# Patient Record
Sex: Female | Born: 1935 | Race: White | Hispanic: No | Smoking: Former smoker
Health system: Southern US, Community
[De-identification: ages and names within clinical notes are randomized; demographics above are authoritative.]

## PROBLEM LIST (undated history)

## (undated) DIAGNOSIS — I509 Heart failure, unspecified: Secondary | ICD-10-CM

## (undated) DIAGNOSIS — R42 Dizziness and giddiness: Secondary | ICD-10-CM

## (undated) DIAGNOSIS — M479 Spondylosis, unspecified: Secondary | ICD-10-CM

## (undated) DIAGNOSIS — E785 Hyperlipidemia, unspecified: Secondary | ICD-10-CM

## (undated) DIAGNOSIS — L900 Lichen sclerosus et atrophicus: Secondary | ICD-10-CM

## (undated) DIAGNOSIS — D649 Anemia, unspecified: Secondary | ICD-10-CM

## (undated) DIAGNOSIS — I209 Angina pectoris, unspecified: Secondary | ICD-10-CM

## (undated) DIAGNOSIS — I4891 Unspecified atrial fibrillation: Secondary | ICD-10-CM

## (undated) DIAGNOSIS — E119 Type 2 diabetes mellitus without complications: Secondary | ICD-10-CM

## (undated) DIAGNOSIS — M419 Scoliosis, unspecified: Secondary | ICD-10-CM

## (undated) DIAGNOSIS — I6529 Occlusion and stenosis of unspecified carotid artery: Secondary | ICD-10-CM

## (undated) DIAGNOSIS — E669 Obesity, unspecified: Secondary | ICD-10-CM

## (undated) DIAGNOSIS — K621 Rectal polyp: Secondary | ICD-10-CM

## (undated) DIAGNOSIS — I10 Essential (primary) hypertension: Secondary | ICD-10-CM

## (undated) DIAGNOSIS — I2510 Atherosclerotic heart disease of native coronary artery without angina pectoris: Secondary | ICD-10-CM

## (undated) DIAGNOSIS — M1990 Unspecified osteoarthritis, unspecified site: Secondary | ICD-10-CM

## (undated) DIAGNOSIS — M4800 Spinal stenosis, site unspecified: Secondary | ICD-10-CM

## (undated) HISTORY — DX: Heart failure, unspecified: I50.9

## (undated) HISTORY — DX: Spinal stenosis, site unspecified: M48.00

## (undated) HISTORY — DX: Essential (primary) hypertension: I10

## (undated) HISTORY — DX: Unspecified atrial fibrillation: I48.91

## (undated) HISTORY — DX: Atherosclerotic heart disease of native coronary artery without angina pectoris: I25.10

## (undated) HISTORY — DX: Rectal polyp: K62.1

## (undated) HISTORY — DX: Lichen sclerosus et atrophicus: L90.0

## (undated) HISTORY — PX: CATARACT EXTRACTION W/ INTRAOCULAR LENS  IMPLANT, BILATERAL: SHX1307

## (undated) HISTORY — PX: WRIST FRACTURE SURGERY: SHX121

## (undated) HISTORY — DX: Hyperlipidemia, unspecified: E78.5

## (undated) HISTORY — DX: Occlusion and stenosis of unspecified carotid artery: I65.29

## (undated) HISTORY — DX: Spondylosis, unspecified: M47.9

## (undated) HISTORY — PX: ANKLE FRACTURE SURGERY: SHX122

## (undated) HISTORY — DX: Scoliosis, unspecified: M41.9

## (undated) HISTORY — PX: FRACTURE SURGERY: SHX138

## (undated) HISTORY — DX: Obesity, unspecified: E66.9

---

## 2000-10-29 ENCOUNTER — Other Ambulatory Visit: Admission: RE | Admit: 2000-10-29 | Discharge: 2000-10-29 | Payer: Self-pay | Admitting: Family Medicine

## 2002-04-29 ENCOUNTER — Other Ambulatory Visit: Admission: RE | Admit: 2002-04-29 | Discharge: 2002-04-29 | Payer: Self-pay | Admitting: Family Medicine

## 2003-01-19 ENCOUNTER — Encounter: Admission: RE | Admit: 2003-01-19 | Discharge: 2003-01-19 | Payer: Self-pay | Admitting: *Deleted

## 2003-01-19 ENCOUNTER — Encounter: Payer: Self-pay | Admitting: *Deleted

## 2003-03-05 ENCOUNTER — Encounter: Payer: Self-pay | Admitting: *Deleted

## 2003-03-05 ENCOUNTER — Encounter: Admission: RE | Admit: 2003-03-05 | Discharge: 2003-03-05 | Payer: Self-pay | Admitting: *Deleted

## 2004-07-04 ENCOUNTER — Encounter: Admission: RE | Admit: 2004-07-04 | Discharge: 2004-07-04 | Payer: Self-pay | Admitting: Family Medicine

## 2004-08-30 ENCOUNTER — Encounter: Admission: RE | Admit: 2004-08-30 | Discharge: 2004-08-30 | Payer: Self-pay | Admitting: Family Medicine

## 2004-09-15 ENCOUNTER — Encounter: Admission: RE | Admit: 2004-09-15 | Discharge: 2004-09-15 | Payer: Self-pay | Admitting: Family Medicine

## 2004-10-11 ENCOUNTER — Other Ambulatory Visit: Admission: RE | Admit: 2004-10-11 | Discharge: 2004-10-11 | Payer: Self-pay | Admitting: Family Medicine

## 2004-11-07 ENCOUNTER — Encounter: Admission: RE | Admit: 2004-11-07 | Discharge: 2004-11-07 | Payer: Self-pay | Admitting: Family Medicine

## 2004-12-31 HISTORY — PX: BACK SURGERY: SHX140

## 2005-03-08 ENCOUNTER — Encounter: Admission: RE | Admit: 2005-03-08 | Discharge: 2005-03-08 | Payer: Self-pay | Admitting: Family Medicine

## 2005-04-17 ENCOUNTER — Encounter: Admission: RE | Admit: 2005-04-17 | Discharge: 2005-04-17 | Payer: Self-pay | Admitting: Family Medicine

## 2005-06-22 ENCOUNTER — Encounter: Admission: RE | Admit: 2005-06-22 | Discharge: 2005-06-22 | Payer: Self-pay | Admitting: Family Medicine

## 2005-07-12 ENCOUNTER — Ambulatory Visit (HOSPITAL_COMMUNITY): Admission: RE | Admit: 2005-07-12 | Discharge: 2005-07-12 | Payer: Self-pay | Admitting: Chiropractic Medicine

## 2005-08-13 ENCOUNTER — Encounter: Admission: RE | Admit: 2005-08-13 | Discharge: 2005-08-13 | Payer: Self-pay | Admitting: Family Medicine

## 2005-08-30 ENCOUNTER — Encounter: Admission: RE | Admit: 2005-08-30 | Discharge: 2005-08-30 | Payer: Self-pay | Admitting: Neurosurgery

## 2005-09-14 ENCOUNTER — Inpatient Hospital Stay (HOSPITAL_COMMUNITY): Admission: RE | Admit: 2005-09-14 | Discharge: 2005-09-16 | Payer: Self-pay | Admitting: Neurosurgery

## 2005-10-12 ENCOUNTER — Encounter: Admission: RE | Admit: 2005-10-12 | Discharge: 2005-10-25 | Payer: Self-pay | Admitting: Neurosurgery

## 2005-12-31 HISTORY — PX: CAROTID ENDARTERECTOMY: SUR193

## 2005-12-31 HISTORY — PX: CORONARY ARTERY BYPASS GRAFT: SHX141

## 2006-01-01 ENCOUNTER — Encounter (INDEPENDENT_AMBULATORY_CARE_PROVIDER_SITE_OTHER): Payer: Self-pay | Admitting: *Deleted

## 2006-01-01 ENCOUNTER — Inpatient Hospital Stay (HOSPITAL_COMMUNITY): Admission: AD | Admit: 2006-01-01 | Discharge: 2006-01-06 | Payer: Self-pay | Admitting: *Deleted

## 2006-01-02 ENCOUNTER — Encounter (INDEPENDENT_AMBULATORY_CARE_PROVIDER_SITE_OTHER): Payer: Self-pay | Admitting: *Deleted

## 2006-02-07 ENCOUNTER — Encounter (HOSPITAL_COMMUNITY): Admission: RE | Admit: 2006-02-07 | Discharge: 2006-05-08 | Payer: Self-pay | Admitting: *Deleted

## 2006-05-09 ENCOUNTER — Encounter (HOSPITAL_COMMUNITY): Admission: RE | Admit: 2006-05-09 | Discharge: 2006-08-07 | Payer: Self-pay | Admitting: *Deleted

## 2007-03-11 ENCOUNTER — Ambulatory Visit: Payer: Self-pay | Admitting: Vascular Surgery

## 2007-10-08 ENCOUNTER — Other Ambulatory Visit: Admission: RE | Admit: 2007-10-08 | Discharge: 2007-10-08 | Payer: Self-pay | Admitting: Gynecology

## 2008-03-23 ENCOUNTER — Ambulatory Visit: Payer: Self-pay | Admitting: Vascular Surgery

## 2009-03-22 ENCOUNTER — Ambulatory Visit: Payer: Self-pay | Admitting: Vascular Surgery

## 2009-06-08 ENCOUNTER — Encounter: Payer: Self-pay | Admitting: Gynecology

## 2009-06-08 ENCOUNTER — Other Ambulatory Visit: Admission: RE | Admit: 2009-06-08 | Discharge: 2009-06-08 | Payer: Self-pay | Admitting: Gynecology

## 2009-06-08 ENCOUNTER — Ambulatory Visit: Payer: Self-pay | Admitting: Gynecology

## 2009-06-13 ENCOUNTER — Ambulatory Visit: Payer: Self-pay | Admitting: Gynecology

## 2010-03-14 ENCOUNTER — Ambulatory Visit: Payer: Self-pay | Admitting: Vascular Surgery

## 2010-03-27 ENCOUNTER — Encounter: Admission: RE | Admit: 2010-03-27 | Discharge: 2010-03-27 | Payer: Self-pay | Admitting: Orthopedic Surgery

## 2010-04-17 ENCOUNTER — Encounter: Admission: RE | Admit: 2010-04-17 | Discharge: 2010-04-17 | Payer: Self-pay | Admitting: Orthopedic Surgery

## 2010-08-16 ENCOUNTER — Ambulatory Visit: Payer: Self-pay | Admitting: Cardiology

## 2010-08-21 ENCOUNTER — Telehealth (INDEPENDENT_AMBULATORY_CARE_PROVIDER_SITE_OTHER): Payer: Self-pay | Admitting: *Deleted

## 2010-08-22 ENCOUNTER — Ambulatory Visit: Payer: Self-pay

## 2010-08-22 ENCOUNTER — Encounter: Payer: Self-pay | Admitting: Cardiovascular Disease

## 2010-08-22 ENCOUNTER — Ambulatory Visit: Payer: Self-pay | Admitting: Cardiovascular Disease

## 2010-08-22 ENCOUNTER — Encounter (HOSPITAL_COMMUNITY): Admission: RE | Admit: 2010-08-22 | Discharge: 2010-09-26 | Payer: Self-pay | Admitting: Cardiology

## 2010-10-17 ENCOUNTER — Ambulatory Visit: Payer: Self-pay | Admitting: Cardiology

## 2010-11-29 ENCOUNTER — Encounter: Admission: RE | Admit: 2010-11-29 | Discharge: 2010-11-29 | Payer: Self-pay | Admitting: Orthopedic Surgery

## 2011-01-21 ENCOUNTER — Encounter: Payer: Self-pay | Admitting: Internal Medicine

## 2011-01-21 ENCOUNTER — Encounter: Payer: Self-pay | Admitting: Neurosurgery

## 2011-01-30 NOTE — Assessment & Plan Note (Signed)
Summary: Cardiology Nuclear Testing  Nuclear Med Background Indications for Stress Test: Evaluation for Ischemia, Graft Patency   History: CABG, Echo, Heart Catheterization, Myocardial Perfusion Study  History Comments: '07 CABG X 1; '09 [id number]; '10 Echo:normal  Symptoms: Chest Pain, Palpitations    Nuclear Pre-Procedure Cardiac Risk Factors: Carotid Disease, Hypertension, Lipids, PVD Caffeine/Decaff Intake: none NPO After: 7:00 AM Lungs: clear IV 0.9% NS with Angio Cath: 22g     IV Site: R Hand IV Started by: Cathlyn Parsons, RN Cup Size 40D     Height (in): 70 Weight (lb): 228 BMI: 32.83  Nuclear Med Study 1 or 2 day study:  1 day     Stress Test Type:  Stress Reading MD:  Charlton Haws, MD     Referring MD:  P.Jordan Resting Radionuclide:  Technetium 16m Tetrofosmin     Resting Radionuclide Dose:  11 mCi  Stress Radionuclide:  Technetium 51m Tetrofosmin     Stress Radionuclide Dose:  33 mCi   Stress Protocol Exercise Time (min):  3:30 min     Max HR:  126 bpm     Predicted Max HR:  145 bpm       Percent Max HR:  86.90 %     METS: 5.2    Stress Test Technologist:  Milana Na, EMT-P     Nuclear Technologist:  Domenic Polite, CNMT  Rest Procedure  Myocardial perfusion imaging was performed at rest 45 minutes following the intravenous administration of Technetium 59m Tetrofosmin.  Stress Procedure  The patient exercised for 3:30. The patient stopped due to fatigue, sob, and denied any chest pain.  There were no significant ST-T wave changes and occ pvcs/VBigemeny.  Technetium 10m Tetrofosmin was injected at peak exercise and myocardial perfusion imaging was performed after a brief delay.  QPS Raw Data Images:  Normal; no motion artifact; normal heart/lung ratio. Stress Images:  Normal homogeneous uptake in all areas of the myocardium. Rest Images:  Normal homogeneous uptake in all areas of the myocardium. Subtraction (SDS):  Normal Transient Ischemic  Dilatation:  0.95  (Normal <1.22)  Lung/Heart Ratio:  0.31  (Normal <0.45)  Quantitative Gated Spect Images QGS EDV:  69 ml QGS ESV:  16 ml QGS EF:  76 % QGS cine images:  normal  Findings Normal nuclear study      Overall Impression  Exercise Capacity: Poor exercise capacity. BP Response: Normal blood pressure response. Clinical Symptoms: No chest pain ECG Impression: No significant ST segment change suggestive of ischemia. Overall Impression: Normal stress nuclear study. Overall Impression Comments: Very poor exercise capacity.  Consider pharmacologic stress in future

## 2011-01-30 NOTE — Progress Notes (Signed)
Summary: Nuclear pre procedure  Phone Note Outgoing Call Call back at [facility] Phone [phone]   Call placed by: Rea College, CMA,  August 21, 2010 4:31 PM Call placed to: Patient Summary of Call: Left message with information on [employer] Information Sheet (see scanned document for details).      Nuclear Med Background Indications for Stress Test: Evaluation for Ischemia, Graft Patency   History: CABG, Echo, Heart Catheterization, Myocardial Perfusion Study  History Comments: '07 CABG X 1; '09 [id number]; '10 Echo:normal  Symptoms: Chest Pain    Nuclear Pre-Procedure Cardiac Risk Factors: Carotid Disease, Hypertension, Lipids, PVD

## 2011-03-22 ENCOUNTER — Other Ambulatory Visit (INDEPENDENT_AMBULATORY_CARE_PROVIDER_SITE_OTHER): Payer: Medicare Other

## 2011-03-22 DIAGNOSIS — Z48812 Encounter for surgical aftercare following surgery on the circulatory system: Secondary | ICD-10-CM

## 2011-03-22 DIAGNOSIS — I6529 Occlusion and stenosis of unspecified carotid artery: Secondary | ICD-10-CM

## 2011-03-31 NOTE — Procedures (Unsigned)
CAROTID DUPLEX EXAM  INDICATION:  Follow up right CEA, left carotid disease.  HISTORY: Diabetes:  No. Cardiac:  Yes. Hypertension:  Yes. Smoking:  Previous. Previous Surgery:  Right CEA in 2007. CV History: Amaurosis Fugax No, Paresthesias No, Hemiparesis No.                                      RIGHT             LEFT Brachial systolic pressure:         152               148 Brachial Doppler waveforms:         WNL               WNL Vertebral direction of flow:        Antegrade         Abnormal antegrade DUPLEX VELOCITIES (cm/sec) CCA peak systolic                   102               79 ECA peak systolic                   122               380 ICA peak systolic                   157               192 ICA end diastolic                   38                50 PLAQUE MORPHOLOGY:                  Heterogenous      Heterogenous PLAQUE AMOUNT:                      Mild              Moderate PLAQUE LOCATION:                    CCA/CEA/ECA       CCA/ECA/ICA  IMPRESSION: 1. 1% to 39% plaquing of right carotid endarterectomy. 2. 40% to 59% left internal carotid artery stenosis. 3. Bilateral external carotid arteries are abnormal. 4. Antegrade bilateral vertebral arteries; however, the left is     abnormal.  ___________________________________________ Di Kindle. Edilia Bo, M.D.  LT/MEDQ  D:  03/22/2011  T:  03/22/2011  Job:  [id number]

## 2011-04-10 ENCOUNTER — Encounter (INDEPENDENT_AMBULATORY_CARE_PROVIDER_SITE_OTHER): Payer: Medicare Other | Admitting: Gynecology

## 2011-04-10 DIAGNOSIS — B373 Candidiasis of vulva and vagina: Secondary | ICD-10-CM

## 2011-04-10 DIAGNOSIS — N898 Other specified noninflammatory disorders of vagina: Secondary | ICD-10-CM

## 2011-04-10 DIAGNOSIS — N390 Urinary tract infection, site not specified: Secondary | ICD-10-CM

## 2011-04-10 DIAGNOSIS — N952 Postmenopausal atrophic vaginitis: Secondary | ICD-10-CM

## 2011-04-10 DIAGNOSIS — R82998 Other abnormal findings in urine: Secondary | ICD-10-CM

## 2011-04-10 DIAGNOSIS — L940 Localized scleroderma [morphea]: Secondary | ICD-10-CM

## 2011-04-30 ENCOUNTER — Other Ambulatory Visit: Payer: Self-pay | Admitting: *Deleted

## 2011-04-30 MED ORDER — NITROGLYCERIN 0.4 MG SL SUBL: 0.4000 mg | SUBLINGUAL_TABLET | SUBLINGUAL | Status: DC | PRN

## 2011-04-30 NOTE — Telephone Encounter (Signed)
escribe medication per fax request  

## 2011-05-02 ENCOUNTER — Emergency Department (HOSPITAL_COMMUNITY): Payer: No Typology Code available for payment source

## 2011-05-02 ENCOUNTER — Inpatient Hospital Stay (HOSPITAL_COMMUNITY)
Admission: EM | Admit: 2011-05-02 | Discharge: 2011-05-05 | DRG: 493 | Disposition: A | Discharge status: Skilled Nursing Facility | Payer: No Typology Code available for payment source | Attending: Orthopedic Surgery | Admitting: Orthopedic Surgery

## 2011-05-02 ENCOUNTER — Other Ambulatory Visit: Payer: Self-pay | Admitting: *Deleted

## 2011-05-02 DIAGNOSIS — S52599A Other fractures of lower end of unspecified radius, initial encounter for closed fracture: Secondary | ICD-10-CM | POA: Diagnosis present

## 2011-05-02 DIAGNOSIS — Z951 Presence of aortocoronary bypass graft: Secondary | ICD-10-CM

## 2011-05-02 DIAGNOSIS — M4120 Other idiopathic scoliosis, site unspecified: Secondary | ICD-10-CM | POA: Diagnosis present

## 2011-05-02 DIAGNOSIS — E669 Obesity, unspecified: Secondary | ICD-10-CM | POA: Diagnosis present

## 2011-05-02 DIAGNOSIS — I2510 Atherosclerotic heart disease of native coronary artery without angina pectoris: Secondary | ICD-10-CM | POA: Diagnosis present

## 2011-05-02 DIAGNOSIS — I050 Rheumatic mitral stenosis: Secondary | ICD-10-CM | POA: Diagnosis present

## 2011-05-02 DIAGNOSIS — R5381 Other malaise: Secondary | ICD-10-CM | POA: Diagnosis present

## 2011-05-02 DIAGNOSIS — S8253XA Displaced fracture of medial malleolus of unspecified tibia, initial encounter for closed fracture: Principal | ICD-10-CM | POA: Diagnosis present

## 2011-05-02 DIAGNOSIS — Z01810 Encounter for preprocedural cardiovascular examination: Secondary | ICD-10-CM

## 2011-05-02 DIAGNOSIS — E785 Hyperlipidemia, unspecified: Secondary | ICD-10-CM | POA: Diagnosis present

## 2011-05-02 DIAGNOSIS — Y998 Other external cause status: Secondary | ICD-10-CM

## 2011-05-02 LAB — COMPREHENSIVE METABOLIC PANEL
ALT: 27 U/L (ref 0–35)
AST: 34 U/L (ref 0–37)
CO2: 23 mEq/L (ref 19–32)
Calcium: 9.1 mg/dL (ref 8.4–10.5)
Chloride: 101 mEq/L (ref 96–112)
Creatinine, Ser: 0.91 mg/dL (ref 0.4–1.2)
GFR calc Af Amer: >60 mL/min (ref >60)
GFR calc non Af Amer: >60 mL/min (ref >60)
Glucose, Bld: 179 mg/dL — ABNORMAL HIGH (ref 70–99)
Total Bilirubin: 0.2 mg/dL — ABNORMAL LOW (ref 0.3–1.2)

## 2011-05-02 LAB — TYPE AND SCREEN
ABO/RH(D): A POS
Antibody Screen: NEGATIVE

## 2011-05-02 LAB — CBC
MCV: 84.2 fL (ref 78.0–100.0)
Platelets: 305 10*3/uL (ref 150–400)
RBC: 4.31 MIL/uL (ref 3.87–5.11)
RDW: 13.8 % (ref 11.5–15.5)
WBC: 12.2 10*3/uL — ABNORMAL HIGH (ref 4.0–10.5)

## 2011-05-02 LAB — PROTIME-INR: Prothrombin Time: 12.9 seconds (ref 11.6–15.2)

## 2011-05-02 MED ORDER — IOHEXOL 300 MG/ML  SOLN
100.0000 mL | Freq: Once | INTRAMUSCULAR | Status: AC | PRN
  Administered 2011-05-02: 100 mL via INTRAVENOUS

## 2011-05-02 MED ORDER — NIACIN ER (ANTIHYPERLIPIDEMIC) 500 MG PO TBCR: 500.0000 mg | EXTENDED_RELEASE_TABLET | Freq: Every day | ORAL | Status: DC

## 2011-05-02 NOTE — Telephone Encounter (Signed)
Fax received from pharmacy. Refill completed. Jodette Zhyon Antenucci RN  

## 2011-05-03 LAB — CBC
Hemoglobin: 10.5 g/dL — ABNORMAL LOW (ref 12.0–15.0)
MCHC: 31.8 g/dL (ref 30.0–36.0)
RDW: 14.1 % (ref 11.5–15.5)
WBC: 11.9 10*3/uL — ABNORMAL HIGH (ref 4.0–10.5)

## 2011-05-03 LAB — BASIC METABOLIC PANEL
Calcium: 8.7 mg/dL (ref 8.4–10.5)
GFR calc Af Amer: >60 mL/min (ref >60)
GFR calc non Af Amer: >60 mL/min (ref >60)
Potassium: 4.8 mEq/L (ref 3.5–5.1)
Sodium: 135 mEq/L (ref 135–145)

## 2011-05-04 LAB — POCT I-STAT, CHEM 8
BUN: 31 mg/dL — ABNORMAL HIGH (ref 6–23)
Chloride: 106 mEq/L (ref 96–112)
HCT: 37 % (ref 36.0–46.0)
Potassium: 4.6 mEq/L (ref 3.5–5.1)
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
## 2011-05-15 NOTE — Procedures (Signed)
CAROTID DUPLEX EXAM   INDICATION:  Followup evaluation of known carotid artery disease.   HISTORY:  Diabetes:  No.  Cardiac:  Coronary artery bypass graft in 2007.  Hypertension:  No.  Smoking:  Quit 3 years ago.  Previous Surgery:  Right carotid endarterectomy in 2007 by Dr. Hart Rochester.  CV History:  Previous duplex on March 11, 2007, revealed no right ICA  stenosis, status post endarterectomy and 20-39% left ICA stenosis.  Amaurosis Fugax No, Paresthesias No, Hemiparesis No                                       RIGHT             LEFT  Brachial systolic pressure:         140               136  Brachial Doppler waveforms:         Triphasic         Triphasic  Vertebral direction of flow:        Antegrade         Antegrade  DUPLEX VELOCITIES (cm/sec)  CCA peak systolic                   87                100  ECA peak systolic                   98                227  ICA peak systolic                   82                148  ICA end diastolic                   20                32  PLAQUE MORPHOLOGY:                  None              Calcified,  irregular  PLAQUE AMOUNT:                      None              Mild to moderate  PLAQUE LOCATION:                    None              Proximal ICA/ECA   IMPRESSION:  1. No right ICA stenosis status post endarterectomy.  2. Left ICA stenosis 40-59%.  3. Left ECA stenosis.  4. No significant change from previous study performed 03/11/2007.   ___________________________________________  [name]. Hart Rochester, M.D.   MC/MEDQ  D:  03/23/2008  T:  03/23/2008  Job:  [id number]

## 2011-05-15 NOTE — Procedures (Signed)
CAROTID DUPLEX EXAM   INDICATION:  Follow up known carotid artery disease.   HISTORY:  Diabetes:  No.  Cardiac:  CABG in 2007.  Hypertension:  No.  Smoking:  Quit about 4 years ago.  Previous Surgery:  Right carotid endarterectomy.  CV History:  Amaurosis Fugax No, Paresthesias No, Hemiparesis No.                                       RIGHT             LEFT  Brachial systolic pressure:         150               150  Brachial Doppler waveforms:         Biphasic          Biphasic  Vertebral direction of flow:        Antegrade         Antegrade  DUPLEX VELOCITIES (cm/sec)  CCA peak systolic                   55                90  ECA peak systolic                   78                343  ICA peak systolic                   92                132  ICA end diastolic                   25                31  PLAQUE MORPHOLOGY:                  None              Heterogenous  PLAQUE AMOUNT:                      None              Moderate  PLAQUE LOCATION:                    None              ICA, ECA   IMPRESSION:  1. 40-59% stenosis noted in the left internal carotid artery.  2. Normal carotid duplex noted in the right internal carotid artery,      status post right carotid endarterectomy.  3. Antegrade bilateral vertebral arteries.   ___________________________________________  Quita Skye Hart Rochester, M.D.   MG/MEDQ  D:  03/22/2009  T:  03/22/2009  Job:  [id number]

## 2011-05-15 NOTE — Procedures (Signed)
CAROTID DUPLEX EXAM   INDICATION:  Followup of carotid disease.   HISTORY:  Diabetes:  No.  Cardiac:  CABG in 2007.  Hypertension:  No.  Smoking:  Previous.  Previous Surgery:  Right carotid endarterectomy.  CV History:  Asymptomatic.  Amaurosis Fugax , Paresthesias , Hemiparesis                                       RIGHT             LEFT  Brachial systolic pressure:         160               146  Brachial Doppler waveforms:         Triphasic         Triphasic  Vertebral direction of flow:        Antegrade         Atypical  DUPLEX VELOCITIES (cm/sec)  CCA peak systolic                   76                72  ECA peak systolic                   101               392  ICA peak systolic                   99                131  ICA end diastolic                   31                43  PLAQUE MORPHOLOGY:                  None              Mixed  PLAQUE AMOUNT:                      None              Moderate  PLAQUE LOCATION:                                      ICA, ECA   IMPRESSION:  1. Patent right carotid with no evidence of restenosis.  2. 40-59% stenosis noted in the left internal carotid artery.  3. Left external carotid artery stenosis.   ___________________________________________  Quita Skye Hart [place], M.D.   CJ/MEDQ  D:  03/14/2010  T:  03/14/2010  Job:  [id number]
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
## 2011-05-18 NOTE — Consult Note (Signed)
[name], [name]               ACCOUNT NO.:  [id number]   MEDICAL RECORD NO.:  [id number]          PATIENT TYPE:  OIB   LOCATION:  2914                         FACILITY:  [facility]   PHYSICIAN:  Di Kindle. Edilia Bo, M.D.DATE OF BIRTH:  1933/06/25   DATE OF CONSULTATION:  01/01/2006  DATE OF DISCHARGE:                                   CONSULTATION   REASON FOR CONSULTATION:  Greater than 80% right carotid stenosis.   HISTORY:  This is a 77 year old woman who had presented with asymptomatic  coronary artery disease. She had a positive stress test and underwent  cardiac catheterization. She had exertional chest pain on and off for the  last three months. She was found to have significant coronary disease and is  scheduled for coronary revascularization by Dr. Tyrone Sage tomorrow as part of  preoperative workup. She underwent a carotid duplex scan which showed a  greater than 80% right carotid stenosis with a 40-60% left carotid stenosis.  Vascular surgery was consulted for further recommendations. Consult was from  Dr. Tyrone Sage.   This patient is right-handed. She denies any previous history of stroke,  TIAs, expressive or receptive aphasia, or amaurosis fugax.   PAST MEDICAL HISTORY:  1.  Significant for coronary artery disease.  2.  Hypercholesterolemia.  3.  Questionable history of hypertension.  4.  She denies any history of diabetes, previous history of myocardial      infarction, history of congestive heart failure, or history of      emphysema.   SOCIAL HISTORY:  She is married. She has three children. She had smoked off  and on for 20 years but quit recently. She does not use alcohol on a regular  basis.   FAMILY HISTORY:  Her father died from a MI age 14. Her mother died from MI  at age 27. She is unaware of any history of premature cardiovascular  disease.   REVIEW OF SYSTEMS:  GENERAL:  She had no weight loss, weight gain or  problems or appetite. CARDIAC:  She  had no recent palpitations or  arrhythmias. She has had exertional chest pain on off last few months.  PULMONARY:  She had no bronchitis, asthma or wheezing. She had no  significant dyspnea on exertion. GI:  She had no recent change of bowel  habits and has no history of peptic ulcer disease. GU:  She had no dysuria  or frequency. NEURO:  She had no dizziness, blackouts, headaches or  seizures. VASCULAR:  She has had no claudication, rest pain or nonhealing  ulcers. She had no previous history of DVT or phlebitis. Review of systems  is otherwise unremarkable.   PHYSICAL EXAMINATION:  VITAL SIGNS:  Blood pressure is 140/58, heart rate is  82, temperature 98.9.  HEENT:  She has bilateral carotid bruits. No cervical lymphadenopathy.  LUNGS:  Clear bilaterally to auscultation.  CARDIAC:  She has a regular rate and rhythm.  ABDOMEN:  Soft and nontender. I cannot palpate an aneurysm. She has palpable  femoral pulses, popliteal and pedal pulses bilaterally.  NEUROLOGIC:  Exam is nonfocal. She has  no significant lower extremity  swelling.   Carotid duplex scan shows a greater than 80% the right carotid stenosis in  the mid right ICA with a good looking vessel distal to this with no  significant stenosis in the distal ICA. End-diastolic velocity in the right  is 129 cm/sec. This is clearly greater than an 80% stenosis. On the left  side, she has 40-60% stenosis. Both vertebral arteries are patent with  normally directed flow.   Given the severity of the carotid stenosis on the right, I have recommended  right carotid endarterectomy combined with her CABG in order to lower her  risk of perioperative and future stroke. We have discussed the indications  for the procedure and potential complications including but not limited to  bleeding, stroke (perioperative risk 1-2%), MI, nerve injury, or other  unpredictable medical problems. All of her questions were answered. She is  agreeable to proceed.  This will be scheduled for either Dr. Madilyn Fireman, Dr.  Hart Rochester or myself. She is on aspirin.      Di Kindle. Edilia Bo, M.D.  Electronically Signed     CSD/MEDQ  D:  01/01/2006  T:  01/01/2006  Job:  [id number]

## 2011-05-18 NOTE — H&P (Signed)
[name], [name]               ACCOUNT NO.:  [id number]   MEDICAL RECORD NO.:  [id number]           PATIENT TYPE:   LOCATION:                                 FACILITY:   PHYSICIAN:  Elmore Guise., M.D.DATE OF BIRTH:  15-Apr-1933   DATE OF ADMISSION:  01/01/2006  DATE OF DISCHARGE:                                HISTORY & PHYSICAL   INDICATION FOR ADMISSION:  Elective cardiac catheterization after abnormal  stress test.   HISTORY OF PRESENT ILLNESS:  The patient has a 77 year old, white female  with past medical history of hypertension, dyslipidemia and strong family  history of coronary disease who presents with increasing exertional chest  pain off and on for the last 3 months.  She had back surgery in September  and following her back surgery, as she started to get around with her normal  activities, she started having an aching in her left chest area.  She  reports that her symptoms are worse when she is up and active such as  walking up a hill or steps.  She does okay when she just does light to  moderate activity.  She had an adenosine Cardiolite with limited exercise on  November14,2006, which showed a reversible inferior wall defect and normal  LV systolic function with an EF of 83%.  She initially wanted to try  medications to see if this would control her symptoms.  She was started on  low dose Toprol.  She continues to be active in her travels.  She continues  to have occasional chest tightness and aching.  She now would like to  discuss possible cardiac catheterization.  Review of systems are otherwise  negative.   CURRENT MEDICATIONS:  1.  Tricor 145 mg daily.  2.  Zetia 10 mg daily.  3.  Prozac 10 mg daily.  4.  Advil p.r.n.  5.  Multivitamin once daily.  6.  Aspirin 325 mg daily.  7.  Toprol XL 25 mg daily.   ALLERGIES:  SULFA causing itching.   FAMILY HISTORY:  Family history is positive for heart disease in her mother  and father.  Both died from  heart attacks.   SOCIAL HISTORY:  She is married.  She does water aerobics approximately 4-5  times per week.  She smoked off and on for 20 pack years, quit approximately  3 months ago.  Does not drink any alcohol.   PHYSICAL EXAMINATION:  VITAL SIGNS:  Her weight is 198 pounds.  Her blood  pressure is 110/80, heart rate 80 and regular.  GENERAL:  She is a very pleasant, elderly white female, alert and oriented  x4 in no acute distress.  NECK:  She has no JVD and no bruits.  LUNGS:  Lungs were clear.  HEART:  Heart regular with a normal S1 and S2.  ABDOMEN:  Soft, nontender, nondistended.  EXTREMITIES:  Extremities are warm with 2+ pulses and no significant edema.   LABORATORY DATA AND X-RAY FINDINGS:  Her EKG shows normal sinus rhythm,  normal axis with poor R wave progression across her precordial  leads.  No  significant ST and T wave changes.  The patient had lab work today to  include CBC, BMP, coagulations.   IMPRESSION:  1.  Continued chest pain with abnormal stress test.  2.  History of hypertension and dyslipidemia.   PLAN:  The patient will be scheduled for cardiac catheterization with  possible intervention if needed.  She will continue her medications as  listed.      Elmore Guise., M.D.  Electronically Signed     TWK/MEDQ  D:  12/25/2005  T:  12/25/2005  Job:  [id number]

## 2011-05-18 NOTE — Discharge Summary (Signed)
[name], HEARLD               ACCOUNT NO.:  [id number]   MEDICAL RECORD NO.:  [id number]          PATIENT TYPE:  INP   LOCATION:  2016                         FACILITY:  [facility]   PHYSICIAN:  Pecola Leisure, PA   DATE OF BIRTH:  13-Oct-1933   DATE OF ADMISSION:  01/01/2006  DATE OF DISCHARGE:  01/06/2006                                 DISCHARGE SUMMARY   ADMISSION DIAGNOSIS:  Chest pain.   PAST MEDICAL HISTORY/DISCHARGE DIAGNOSES:  1.  Hypertension.  2.  Dyslipidemia.  3.  Depression.  4.  Coronary artery disease, status post coronary artery bypass grafting x1.  5.  Right internal carotid artery stenosis, status post right carotid      endarterectomy.  6.  Elevated blood sugars postoperatively.   ALLERGIES:  SULFA.   BRIEF HISTORY:  The patient is a 77 year old Caucasian female, who in  September of 2006 underwent a lumbar laminectomy secondary to chronic back  pain.  As she was recovering and participating in rehab, she noticed an  increase in exertional substernal chest pain with mild dyspnea.  It was  relieved with rest.  She noted that for the past month the amount of pain  decreased; however, she had markedly limited her activities.  The patient  was evaluated by Dr. Reyes Ivan, and she presented to [facility] on January 01, 2006, for a cardiac stress test.  That was performed by Dr. Reyes Ivan and  revealed normal LV function with a reversible inferior perfusion defect that  was small.  It was deemed an abnormal study, and therefore the patient was  taken for cardiac cath the same day.  The patient was noted to have a normal  left-appearing system and a greater than 90% ostial right coronary lesion,  and then a mid right lesion on a moderate-sized right vessel.  Angioplasty  was attempted of the right system; however, the ostial lesion did not open  much with angioplasty.  The mid lesion slightly improved, however, while  carrying out this procedure, a dissection occurred  distal to the mid lesion.  The patient remained hemodynamically stable, however, and there were no EKG  changes.  Dr. Sheliah Plane of the CVTS Service was consulted regarding  potential surgical revascularization.  Dr. Tyrone Sage evaluated the patient on  January 01, 2006, and it was his opinion that the patient should proceed with  coronary artery bypass graft surgery.  In preparation for the patient's  surgery, preoperative carotid Dopplers were performed, and this revealed a  greater than 80% right carotid stenosis and a 40 to 60% left carotid  stenosis.  Secondary to these findings, the Vascular Surgery Service was  consulted for further recommendations.  Dr. Edilia Bo performed the evaluation  on January 01, 2006, and it was his opinion that the patient should proceed  with a combined right carotid endarterectomy and CABG procedure.   HOSPITAL COURSE:  The patient was admitted and underwent all the studies as  previously stated.  She was found to have bilateral carotid artery stenoses  and coronary artery disease.  The patient was then taken to  the OR on  January 02, 2006, for a combined coronary artery bypass grafting and a right  carotid endarterectomy.  The coronary artery bypass grafting x1 was a  saphenous vein graft to the right coronary artery.  The right carotid  endarterectomy with Dacron patch angioplasty was performed by Dr. Hart Rochester  simultaneously.  The endoscopic vessel harvesting was performed on the left  thigh.  The patient tolerated the procedure well and was hemodynamically  stable immediately postoperatively.  The patient was transferred from the OR  to the SICU in stable condition.  The patient was extubated without  complication and woke up from anesthesia neurologically intact.   The patient's postoperative course has progressed as expected.  The only  exception was a noted elevation in her CBGs postoperatively.  These have  been monitored, and she has not been  treated medically.  On postoperative  day one, the patient was neurologically intact, and the right neck wound was  healing well.  She was afebrile with stable vital signs.  All chest tubes  and invasive lines were discontinued in a routine manner.  All drips were  weaned accordingly.  The patient has been volume overloaded postoperatively  and has been diuresed accordingly.   The patient's postoperative course has continued to progress as expected.  She began cardiac rehab on postoperative day one and has continued to  increase her tolerance to a satisfactory level at this time.  On  postoperative day three, she is without complaint, and her bowel function  has returned.  She is afebrile with stable vital signs and maintaining a  normal sinus rhythm.  Her chest x-ray is stable.  On physical examination,  cardiac is regular rate and rhythm, lungs are clear to auscultation, abdomen  is benign.  The incisions are clean, dry, and intact.  There is no edema  present in the extremities, and she is neurologically intact.  Of note on  postoperative day three, she is noted to have an acute blood loss anemia  with a hemoglobin of 8.3 and an hematocrit of 24.1.  She is being started on  iron and folic acid, and this will be monitored with a followup CBC prior to  discharge.  As long as the patient continues to progress in the current  manner, she will be ready for discharge in the next one to two days pending  morning round reevaluation.   LABORATORY DATA:  CBC and BMP on January 05, 2006:  White count 9, hemoglobin  8.3, hematocrit 24.1, platelets 200, sodium 133, potassium 3.9, BUN 6,  creatinine 0.6, glucose 115.   CONDITION ON DISCHARGE:  Stable.   INSTRUCTIONS:   MEDICATIONS:  1.  Aspirin 325 mg daily.  2.  Toprol-XL 25 mg daily.  3.  Zetia 10 mg daily.  4.  Tricor 145 mg daily.  5.  Niferex 150 mg daily.  6.  Folic acid 1 mg daily.  7.  Lasix 40 mg daily x5 days.  8.  K-Dur 20 mEq  daily x5 days. 9.  Prozac 10 mg Monday, Wednesday, Friday.  10. Pravachol 40 mg daily.  11. Tylox 1-2 q.4-6h p.r.n. pain.   ACTIVITY:  No driving, no lifting of more than 10 pounds, and the patient  should continue daily breathing and walking exercises.   DIET:  Low salt, low fat.   WOUND CARE:  The patient may shower daily and clean the incisions with soap  and water.  Of wound problems arise, she  is to contact the CVTS office.   FOLLOWUP APPOINTMENTS:  1.  A followup appointment with Dr. Reyes Ivan.  She will be instructed to      contact his office for an appointment two weeks after discharge at which      time a chest x-ray will be taken.  2.  With Dr. Tyrone Sage three weeks after discharge.  The CVTS office will      contact the patient with a date and time      of this appointment.  3.  With Dr. Hart Rochester six months after discharge with followup carotid duplex.      The CVTS office will also contact the patient with a date and time of      this appointment.      Pecola Leisure, PA     AY/MEDQ  D:  01/05/2006  T:  01/06/2006  Job:  [id number]   cc:   Quita Skye. Hart Rochester, M.D.  [address]  [place]  [place] [address]

## 2011-05-18 NOTE — Op Note (Signed)
[name], [name]               ACCOUNT NO.:  [id number]   MEDICAL RECORD NO.:  [id number]          PATIENT TYPE:  INP   LOCATION:  2399                         FACILITY:  [facility]   PHYSICIAN:  Quita Skye. Hart Rochester, M.D.  DATE OF BIRTH:  05-07-1933   DATE OF PROCEDURE:  01/02/2006  DATE OF DISCHARGE:                                 OPERATIVE REPORT   PREOPERATIVE DIAGNOSIS:  Severe right carotid stenosis, asymptomatic, with  dissection of right coronary artery following percutaneous transluminal  coronary angioplasty with need for coronary artery bypass grafting.   POSTOPERATIVE DIAGNOSIS:  Severe right carotid stenosis, asymptomatic, with  dissection of right coronary artery following percutaneous transluminal  coronary angioplasty with need for coronary artery bypass grafting.   OPERATION:  Dr. Candie Chroman portion is right carotid endarterectomy with Dacron  patch angioplasty.   SURGEON:  Dr. Hart Rochester.   FIRST ASSISTANT:  Dr. Ofilia Neas.   SECOND ASSISTANT:  Zadie Rhine, PA   ANESTHESIA:  General endotracheal.   BRIEF HISTORY:  This patient was scheduled for coronary artery bypass  grafting by Dr. Tyrone Sage following an attempt at PTCA and stenting of the  right coronary artery which resulted in a dissection of the right coronary  artery.  In her preoperative duplex scan of her carotid arteries, she was  noted to have an 85-90% right internal carotid stenosis which was  asymptomatic and a mild left internal carotid stenosis.  She was scheduled  for right carotid endarterectomy in conjunction with coronary artery bypass  grafting.   PROCEDURE:  The patient was taken to the operating room and placed in supine  position at which time satisfactory general endotracheal anesthesia was  administered.  Appropriate monitoring lines were inserted by anesthesia, and  the routine prepping and draping for coronary artery bypass grafting was  performed as well as the right neck.  Incision  was made along the anterior  border of the sternocleidomastoid muscle and carried down through  subcutaneous tissue and platysma using the Bovie.  The common facial vein  and external jugular vein was ligated with 3-0 silk ties and divided,  exposing the common, internal and external carotid arteries.  Care was taken  not to injure the vagus or hypoglossal nerves, both of which were exposed.  There was a calcified atherosclerotic plaque at the carotid bifurcation  extending up the internal carotid about 3 cm.  Distal vessel appeared  normal.  A #10 shunt was prepared and the patient was heparinized.  The  carotid vessels were occluded with vascular clamps, a longitudinal opening  made in the common carotid with a 15 blade, extended up the internal carotid  with a Potts scissors to a point distal to the disease.  The plaque was  approximately 80% stenotic in severity, but the distal vessel appeared  normal.  The #10 shunt was prepared, and the patient was heparinized.  Carotid vessels were occluded with vascular clamps, a longitudinal opening  made in the common carotid with a 15 blade and extended with Potts scissors.  The #10 shunt was inserted without difficulty, reestablishing flow in about  two minutes.  A standard endarterectomy was then performed using the  elevator and the Potts scissors with an eversion endarterectomy of the  external carotid.  The plaque feathered off the distal internal carotid  artery nicely, not requiring any tacking sutures.  Lumen was thoroughly  irrigated heparin saline.  All loose debris carefully removed.  Arteriotomy  was closed with a patch using continuous 6-0 Prolene.  Prior to completion  of the closure the shunt was removed, and following antegrade and retrograde  flushing the closure was completed with reestablishment of flow initially up  the external and up the internal branch.  Carotid was occluded for less than  two minutes for removal of the  shunt.   There was excellent Doppler flow in both vessels at the conclusion of this  procedure.  Dr. Tyrone Sage then proceeded with coronary artery bypass  grafting, and following this the right neck was closed in the routine  manner.           ______________________________  Quita Skye. Hart Rochester, M.D.     JDL/MEDQ  D:  01/02/2006  T:  01/02/2006  Job:  [id number]

## 2011-05-18 NOTE — Cardiovascular Report (Signed)
[name], [name]               ACCOUNT NO.:  [id number]   MEDICAL RECORD NO.:  [id number]          PATIENT TYPE:  INP   LOCATION:  2399                         FACILITY:  [facility]   PHYSICIAN:  [name]. Swaziland, M.D.  DATE OF BIRTH:  May 04, 1933   DATE OF PROCEDURE:  01/01/2006  DATE OF DISCHARGE:                              CARDIAC CATHETERIZATION   PROCEDURE:  Coronary intervention procedure   ATTENDING:  [name]. [place]az[place]and, M.D.   INDICATIONS FOR PROCEDURE:  [name] who presented with  typical anginal symptoms and had an abnormal stress test.  Diagnostic  cardiac catheterization performed by Dr. Reyes Ivan demonstrated single-vessel  obstructive coronary disease.  She had a critical ostial right coronary  stenosis at 95%; this was followed by a 90% stenosis in the mid right  coronary in an acutely angulated bend.  The right coronary was a dominant  vessel, supplying the PDA and posterolateral branch.   ACCESS:  Via right to right femoral artery using a 7-French arterial sheath.   CONTRAST:  A total of 450 mL of contrast were used for both the diagnostic  and interventional procedure.   MEDICATIONS FOR THE INTERVENTION:  Included heparin 4500 units IV,  Integrilin double bolus 180 mcg/kg followed by continuous infusion of 2  mcg/kg per minute and additional Versed 1 mg IV, nitroglycerin 200 mcg  intracoronary x2 and an additional 2000 units of heparin were given later in  the procedure.   INTERVENTIONAL PROCEDURE:  We initially used a 7-French AR-1 guide and a  0.014 high-torque floppy extra-support wire for access.  Despite having  reasonable support, the guide support was suboptimal, given the severe  ostial stenosis and difficulty seating the guide catheter.  Sideholes were  used in the catheter.  We were initially able to cross the right coronary  lesions with moderate difficulty using the extra-support wire.  We initially  dilated both lesions using a 2.0  x 15-mm Maverick balloon.  The ostial  lesion was dilated at 10 and then 12 atmospheres.  The mid-vessel lesion was  dilated to 8 atmospheres x2.  We then upgraded to a 2.5 x 15-mm Maverick  balloon, dilating the mid-vessel lesion up to 8 atmospheres and the ostium  at 10 and then 12 atmospheres.  Despite full balloon expansion, there was  still severe waisting of the balloon at the ostial site, showing an adequate  expansion.  At this point we attempted to cross the lesion with a cutting  balloon.  We initially used a 3.0 x 10-mm cutter, but we were unable to pass  the ostium.  We then downgraded to the 2.5-mm x 10 cutter and again were  able to pass the cutting balloon past the ostium.  Again, guide support here  was very difficult, although AR-1 appear to be coaxial and to offer good  transaxial support.  In the process of attempting to cross the ostium with  the cutting balloon, we did have difficulty with the guide backing out and  pulling the wire back as well across the lesion.  We had a great deal  of  difficulty at this point of re-engaging the ostium of the right coronary  artery.  The AR-1 was unable to re-engage.  We switched to left Amplatz 1  and with difficulty, we did get catheter engagement, but at an acute  angulation that made [place] unable to cross the ostium with a wire.  We then  switched to a 6-French 3-D RC guide,  which was able to seat in the ostium,  but still gave marginal support.  We were able to pass a wire down across  the ostium, but when we tried to cross the mid-vessel lesion, the wire would  not pass.  It appeared to go subintimal and at this point, there was  evidence of subintimal dissection.  We attempted to cross also with a Choyce  PT wire, again without success.  The spiral dissection did propagate down  distally, resulting in compromise in flow of both the PDA and posterolateral  branches.  The patient remained completely pain-free.  Her ECG showed no   acute ST changes and she remained hemodynamically stable.  This was felt to  be due to the fact that she did have reasonably good collateral blood flow  prior to the procedure from the left coronary artery.  At this point, it was  felt that further attempts at crossing the mid-vessel lesion were futile.  The patient was stable and the procedure was stopped at this point and she  was transferred to the coronary intensive care unit for aggressive medical  therapy.  It was felt that she should have any recurrent chest pain, then  she would require coronary bypass surgery.   FINAL INTERPRETATION:  Successful catheter-based intervention of the mid and  ostial right coronary artery due to development of acute spiral dissection  in the mid-vessel propagating distally.  Procedure was complicated by  difficulties with adequate guide support due to the ostial location of the  lesion and the fact that we were unable to expand this lesion adequately  with balloon.           ______________________________  [name]. Swaziland, M.D.     PMJ/MEDQ  D:  01/02/2006  T:  01/02/2006  Job:  [id number]   cc:   Elmore Guise., M.D.  Fax: [phone]   Sheliah Plane, MD  [address]  [place]  [place] [address]

## 2011-06-26 ENCOUNTER — Inpatient Hospital Stay (HOSPITAL_COMMUNITY)
Admission: EM | Admit: 2011-06-26 | Discharge: 2011-06-27 | DRG: 247 | Disposition: A | Discharge status: Home / Self Care | Payer: Medicare Other | Attending: Cardiology | Admitting: Cardiology

## 2011-06-26 ENCOUNTER — Emergency Department (HOSPITAL_COMMUNITY): Payer: Medicare Other

## 2011-06-26 DIAGNOSIS — Z7982 Long term (current) use of aspirin: Secondary | ICD-10-CM

## 2011-06-26 DIAGNOSIS — R079 Chest pain, unspecified: Secondary | ICD-10-CM

## 2011-06-26 DIAGNOSIS — R072 Precordial pain: Secondary | ICD-10-CM

## 2011-06-26 DIAGNOSIS — E669 Obesity, unspecified: Secondary | ICD-10-CM | POA: Diagnosis present

## 2011-06-26 DIAGNOSIS — Z951 Presence of aortocoronary bypass graft: Secondary | ICD-10-CM

## 2011-06-26 DIAGNOSIS — I10 Essential (primary) hypertension: Secondary | ICD-10-CM | POA: Diagnosis present

## 2011-06-26 DIAGNOSIS — I200 Unstable angina: Secondary | ICD-10-CM | POA: Diagnosis present

## 2011-06-26 DIAGNOSIS — I2510 Atherosclerotic heart disease of native coronary artery without angina pectoris: Secondary | ICD-10-CM

## 2011-06-26 DIAGNOSIS — F329 Major depressive disorder, single episode, unspecified: Secondary | ICD-10-CM | POA: Diagnosis present

## 2011-06-26 DIAGNOSIS — Z87891 Personal history of nicotine dependence: Secondary | ICD-10-CM

## 2011-06-26 DIAGNOSIS — E785 Hyperlipidemia, unspecified: Secondary | ICD-10-CM | POA: Diagnosis present

## 2011-06-26 DIAGNOSIS — M4120 Other idiopathic scoliosis, site unspecified: Secondary | ICD-10-CM | POA: Diagnosis present

## 2011-06-26 DIAGNOSIS — F3289 Other specified depressive episodes: Secondary | ICD-10-CM | POA: Diagnosis present

## 2011-06-26 DIAGNOSIS — I509 Heart failure, unspecified: Secondary | ICD-10-CM | POA: Diagnosis present

## 2011-06-26 LAB — APTT: aPTT: 35 seconds (ref 24–37)

## 2011-06-26 LAB — DIFFERENTIAL
Lymphocytes Relative: 18 % (ref 12–46)
Lymphs Abs: 1.3 10*3/uL (ref 0.7–4.0)
Neutro Abs: 5 10*3/uL (ref 1.7–7.7)
Neutrophils Relative %: 69 % (ref 43–77)

## 2011-06-26 LAB — BASIC METABOLIC PANEL
CO2: 21 mEq/L (ref 19–32)
Calcium: 8.5 mg/dL (ref 8.4–10.5)
Creatinine, Ser: 0.71 mg/dL (ref 0.50–1.10)

## 2011-06-26 LAB — CK TOTAL AND CKMB (NOT AT ARMC)
CK, MB: 3.4 ng/mL (ref 0.3–4.0)
CK, MB: 3.5 ng/mL (ref 0.3–4.0)
Relative Index: INVALID (ref 0.0–2.5)
Relative Index: INVALID (ref 0.0–2.5)
Total CK: 78 U/L (ref 7–177)
Total CK: 84 U/L (ref 7–177)

## 2011-06-26 LAB — CBC
HCT: 32.4 % — ABNORMAL LOW (ref 36.0–46.0)
Hemoglobin: 10.6 g/dL — ABNORMAL LOW (ref 12.0–15.0)
MCV: 84.2 fL (ref 78.0–100.0)
Platelets: 235 10*3/uL (ref 150–400)
RBC: 3.85 MIL/uL — ABNORMAL LOW (ref 3.87–5.11)
WBC: 7.3 10*3/uL (ref 4.0–10.5)

## 2011-06-26 LAB — CARDIAC PANEL(CRET KIN+CKTOT+MB+TROPI): Relative Index: INVALID (ref 0.0–2.5)

## 2011-06-26 LAB — TSH: TSH: 2.113 u[IU]/mL (ref 0.350–4.500)

## 2011-06-26 LAB — PRO B NATRIURETIC PEPTIDE: Pro B Natriuretic peptide (BNP): 911.3 pg/mL — ABNORMAL HIGH (ref 0–450)

## 2011-06-26 LAB — TROPONIN I: Troponin I: <0.3 ng/mL (ref <0.30)

## 2011-06-26 MED ORDER — TECHNETIUM TO 99M ALBUMIN AGGREGATED
6.0000 | Freq: Once | INTRAVENOUS | Status: AC | PRN
  Administered 2011-06-26: 6 via INTRAVENOUS

## 2011-06-26 MED ORDER — XENON XE 133 GAS
10.0000 | GAS_FOR_INHALATION | Freq: Once | RESPIRATORY_TRACT | Status: AC | PRN
  Administered 2011-06-26: 10 via RESPIRATORY_TRACT

## 2011-06-27 DIAGNOSIS — I200 Unstable angina: Secondary | ICD-10-CM

## 2011-06-27 HISTORY — PX: CORONARY ANGIOPLASTY WITH STENT PLACEMENT: SHX49

## 2011-06-27 LAB — CBC
MCV: 83.8 fL (ref 78.0–100.0)
Platelets: 205 10*3/uL (ref 150–400)
RBC: 3.7 MIL/uL — ABNORMAL LOW (ref 3.87–5.11)
WBC: 6.3 10*3/uL (ref 4.0–10.5)

## 2011-06-27 LAB — BASIC METABOLIC PANEL
CO2: 24 mEq/L (ref 19–32)
Chloride: 107 mEq/L (ref 96–112)
Sodium: 140 mEq/L (ref 135–145)

## 2011-06-27 LAB — LIPID PANEL
Cholesterol: 182 mg/dL (ref 0–200)
HDL: 35 mg/dL — ABNORMAL LOW (ref >39)
Total CHOL/HDL Ratio: 5.2 RATIO
Triglycerides: 149 mg/dL (ref <150)
VLDL: 30 mg/dL (ref 0–40)

## 2011-07-02 ENCOUNTER — Encounter: Payer: Self-pay | Admitting: Nurse Practitioner

## 2011-07-02 NOTE — Cardiovascular Report (Signed)
[name], [name]               ACCOUNT NO.:  [id number]  MEDICAL RECORD NO.:  [id number]  LOCATION:  6526                         FACILITY:  [facility]  PHYSICIAN:  [name]. Swaziland, M.D.  DATE OF BIRTH:  1933/03/20  DATE OF PROCEDURE:  06/26/2011 DATE OF DISCHARGE:                           CARDIAC CATHETERIZATION   INDICATIONS FOR PROCEDURE:  The patient is a 77 year old white female with known history of coronary artery disease.  She is status post failed angioplasty of the right coronary artery in 2007 and had emergent single-vessel bypass of the right coronary artery.  She now presents with typical unstable angina.  PROCEDURE:  Left heart catheterization, coronary and left ventricular angiography.  ACCESSES:  Via the right femoral artery using standard Seldinger technique.  EQUIPMENT:  5-French 4-cm right and left Judkins catheter, 5-French pigtail catheter, 5-French arterial sheath, 6-French arterial sheath, 6- French left 4 guide, a Prowater wire, a BMW wire, a 2.25 x 12-mm Emerge balloon, a 2.5 x 12-mm Promus stent, a 2.5 x 8-mm [place] Quantum apex balloon.  MEDICATIONS:  Local anesthesia 1% Xylocaine, Versed total of 3 mg IV, fentanyl 25 mcg IV, Plavix 600 mg p.o., labetalol 20 mg IV, Apresoline 10 mg IV, nitroglycerin 200 mcg intracoronary x1, Angiomax 0.75 mg/kg bolus followed by continuous infusion at 1.75 mg/kg per hour. Subsequent ACT was 402.  CONTRAST:  180 mL of Omnipaque.  HEMODYNAMIC DATA:  The aortic pressure was 210/92 with a mean of 144 mmHg.  Left ventricular pressure was 209 with an EDP of 29 mmHg.  ANGIOGRAPHIC DATA:  The left coronary artery arises and distributes normally.  The left main coronary artery is without significant disease.  The left anterior descending artery has mild wall irregularities.  The left circumflex coronary artery had a focal 90% stenosis in the midvessel.  The right coronary artery arises and distributes normally.  It has  80- 90% ostial stenosis.  This is followed by long 90% stenosis from the proximal to mid vessel.  The saphenous vein graft to the PDA is widely patent with excellent runoff.  Left ventricular angiography performed in the RAO view demonstrates normal left ventricular size and contractility with normal systolic function.  Ejection fraction is estimated at 55-60%.  We proceeded at this point with percutaneous intervention of the mid circumflex.  We were able to cross this easily with the Prowater wire. We predilated the lesion with a 2.25-mm balloon to 6 atmospheres.  At this point, we attempted to cross with our stent, but we were unable to negotiate the proximal circumflex because of the angulation and calcification.  We then placed a BMW wire across the lesion and used this as our primary wire using a Prowater wire as a buddy wire.  At this point, we were able to cross the lesion with a stent.  It was deployed at 12 atmospheres.  We postdilated the stent with a 2.5 x 8-mm [place] Quantum balloon to 16 atmospheres x2.  This yielded an excellent angiographic result with 0% residual stenosis and TIMI grade 3 flow.  At this point, we closed the right femoral access using an Angio-Seal device with excellent hemostasis.  FINAL INTERPRETATION: 1. Two-vessel  obstructive atherosclerotic coronary artery disease. 2. Patent saphenous vein graft to PDA. 3. Normal left ventricular function. 4. Successful stenting of the mid left circumflex coronary artery.  PLAN:  We will continue aspirin and Plavix.          ______________________________ [name]. Swaziland, M.D.     PMJ/MEDQ  D:  06/26/2011  T:  06/27/2011  Job:  [id number]  cc:   Tammy R. Collins Scotland, M.D.  Electronically Signed by PETER Swaziland M.D. on 07/02/2011 09:37:31 AM
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
## 2011-07-11 ENCOUNTER — Encounter: Payer: No Typology Code available for payment source | Admitting: Nurse Practitioner

## 2011-07-18 ENCOUNTER — Other Ambulatory Visit: Payer: Self-pay | Admitting: *Deleted

## 2011-07-18 ENCOUNTER — Ambulatory Visit (INDEPENDENT_AMBULATORY_CARE_PROVIDER_SITE_OTHER): Payer: Medicare Other | Admitting: Nurse Practitioner

## 2011-07-18 ENCOUNTER — Encounter: Payer: Self-pay | Admitting: Nurse Practitioner

## 2011-07-18 VITALS — BP 124/80 | HR 60 | Ht 68.5 in | Wt 218.0 lb

## 2011-07-18 DIAGNOSIS — E785 Hyperlipidemia, unspecified: Secondary | ICD-10-CM | POA: Insufficient documentation

## 2011-07-18 DIAGNOSIS — I2510 Atherosclerotic heart disease of native coronary artery without angina pectoris: Secondary | ICD-10-CM

## 2011-07-18 MED ORDER — LOSARTAN POTASSIUM 50 MG PO TABS: 50.0000 mg | ORAL_TABLET | Freq: Every day | ORAL | Status: DC

## 2011-07-18 NOTE — Patient Instructions (Signed)
Stay on your current medicines. Regular exercise and diet is recommended I will see you back in 6 weeks

## 2011-07-18 NOTE — Assessment & Plan Note (Signed)
Weight loss is recommended 

## 2011-07-18 NOTE — Telephone Encounter (Signed)
escribe medication per fax request  
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
## 2011-07-18 NOTE — Assessment & Plan Note (Signed)
She has a history of failed angioplasty of the RCA with subsequent single vessel CABG in [place] of 2007. She is now s/p DES to the LCX. Graft is patent. She is now asymptomatic and doing well. No change in her medicines. She is committed to Plavix for the next one year. We will see her back in 6 weeks. Patient is agreeable to this plan and will call if any problems develop in the interim.

## 2011-07-18 NOTE — Assessment & Plan Note (Signed)
She is allergic to statin, tricor and zetia. I stressed the importance of weight loss and dietary measures. She is on Niaspan and able to tolerate.
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
## 2011-07-31 ENCOUNTER — Telehealth: Payer: Self-pay | Admitting: Cardiology

## 2011-07-31 NOTE — Telephone Encounter (Signed)
PT SAID HAS A COUPLE OF QUESTIONS FOR ANITA OR LORI. WOULD NOT ELABORATE.

## 2011-08-02 ENCOUNTER — Telehealth: Payer: Self-pay | Admitting: Cardiology

## 2011-08-02 NOTE — Telephone Encounter (Signed)
Called to ask a question about stopping her plavix prior to getting her epidural shot. Decided not to have the epidural in her back done and feels like she doesn't need it to stop her plavix. Said there is no need to call back.

## 2011-08-24 ENCOUNTER — Other Ambulatory Visit: Payer: Self-pay | Admitting: *Deleted

## 2011-08-24 MED ORDER — METOPROLOL SUCCINATE ER 25 MG PO TB24: 25.0000 mg | ORAL_TABLET | Freq: Every day | ORAL | Status: DC

## 2011-08-24 NOTE — Telephone Encounter (Signed)
escribe medication per fax request  

## 2011-08-29 ENCOUNTER — Ambulatory Visit (INDEPENDENT_AMBULATORY_CARE_PROVIDER_SITE_OTHER): Payer: Medicare Other | Admitting: Nurse Practitioner

## 2011-08-29 ENCOUNTER — Telehealth: Payer: Self-pay | Admitting: Cardiology

## 2011-08-29 ENCOUNTER — Encounter: Payer: Self-pay | Admitting: Nurse Practitioner

## 2011-08-29 DIAGNOSIS — I130 Hypertensive heart and chronic kidney disease with heart failure and stage 1 through stage 4 chronic kidney disease, or unspecified chronic kidney disease: Secondary | ICD-10-CM | POA: Insufficient documentation

## 2011-08-29 DIAGNOSIS — I10 Essential (primary) hypertension: Secondary | ICD-10-CM

## 2011-08-29 DIAGNOSIS — I2510 Atherosclerotic heart disease of native coronary artery without angina pectoris: Secondary | ICD-10-CM

## 2011-08-29 MED ORDER — LOSARTAN POTASSIUM 50 MG PO TABS: 50.0000 mg | ORAL_TABLET | Freq: Two times a day (BID) | ORAL | Status: DC

## 2011-08-29 NOTE — Telephone Encounter (Signed)
Lm that Losartan is 50 mg BID # 60 w/ 3 refills per Sunday Spillers

## 2011-08-29 NOTE — Patient Instructions (Signed)
I want you to increase your Cozaar to 50 mg two times a day Try to check some blood pressures at home [facility] to use the Tylenol arthritis and try to not use Mobic I will see you in 3 weeks and check some lab work then. You do not need to fast. Call for any problems.

## 2011-08-29 NOTE — Assessment & Plan Note (Signed)
She is doing well. No chest pain reported. She is committed to her Plavix for one year.
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
## 2011-08-29 NOTE — Telephone Encounter (Signed)
Had a question about the Losartin dosage that Mason sent in today . Please call back. I have pulled the chart.

## 2011-08-29 NOTE — Assessment & Plan Note (Signed)
Blood pressure is up today. She takes her medicines at night. I have increased her Cozaar to 50 mg BID. I will see her back in 3 weeks with a BMET. Have asked her to avoid [facility] and to try Tylenol Arthritis instead. She is to try and check some blood pressures as an outpatient.

## 2011-09-19 ENCOUNTER — Encounter: Payer: Self-pay | Admitting: Nurse Practitioner

## 2011-09-19 ENCOUNTER — Ambulatory Visit (INDEPENDENT_AMBULATORY_CARE_PROVIDER_SITE_OTHER): Payer: Medicare Other | Admitting: Nurse Practitioner

## 2011-09-19 ENCOUNTER — Other Ambulatory Visit (INDEPENDENT_AMBULATORY_CARE_PROVIDER_SITE_OTHER): Payer: Medicare Other | Admitting: *Deleted

## 2011-09-19 DIAGNOSIS — I10 Essential (primary) hypertension: Secondary | ICD-10-CM

## 2011-09-19 DIAGNOSIS — R42 Dizziness and giddiness: Secondary | ICD-10-CM

## 2011-09-19 DIAGNOSIS — I2510 Atherosclerotic heart disease of native coronary artery without angina pectoris: Secondary | ICD-10-CM

## 2011-09-19 LAB — BASIC METABOLIC PANEL
BUN: 25 mg/dL — ABNORMAL HIGH (ref 6–23)
CO2: 28 mEq/L (ref 19–32)
Calcium: 9.2 mg/dL (ref 8.4–10.5)
Chloride: 103 mEq/L (ref 96–112)
Creatinine, Ser: 0.8 mg/dL (ref 0.4–1.2)
GFR: 78.61 mL/min (ref >60.00)
Glucose, Bld: 102 mg/dL — ABNORMAL HIGH (ref 70–99)
Potassium: 5.6 mEq/L — ABNORMAL HIGH (ref 3.5–5.1)
Sodium: 137 mEq/L (ref 135–145)

## 2011-09-19 MED ORDER — MECLIZINE HCL 25 MG PO TABS: 25.0000 mg | ORAL_TABLET | Freq: Three times a day (TID) | ORAL | Status: AC | PRN

## 2011-09-19 NOTE — Assessment & Plan Note (Signed)
Blood pressure remains up. It is running high at home as well. She is agreeable to now increasing the Cozaar to 50 mg BID. I will see her back in a month.
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
## 2011-09-19 NOTE — Assessment & Plan Note (Signed)
I think this is more inner ear related. She may try some meclizine.

## 2011-09-19 NOTE — Assessment & Plan Note (Addendum)
No chest pain. Exercise and weight loss are encouraged.

## 2011-09-19 NOTE — Patient Instructions (Signed)
Lets try the Cozaar twice a day Lets try some Meclizine 25 mg as needed for dizziness. You may take up to 3 times a day. I want to see you in a month.  Monitor your blood pressure at home and keep a diary  Call for any problems.

## 2011-09-26 ENCOUNTER — Telehealth: Payer: Self-pay | Admitting: *Deleted

## 2011-09-26 DIAGNOSIS — I10 Essential (primary) hypertension: Secondary | ICD-10-CM

## 2011-09-26 NOTE — Telephone Encounter (Signed)
Notified of lab results. Will repeat Bmet tomorrow 9/27

## 2011-09-26 NOTE — Telephone Encounter (Signed)
Message copied by Lorayne Bender on Wed Sep 26, 2011 10:26 AM ------      Message from: Rosalio Macadamia      Created: Thu Sep 20, 2011  7:53 AM       Need to redraw. Her Cozaar was increased at the office visit. No need to fast.

## 2011-09-27 ENCOUNTER — Other Ambulatory Visit: Payer: Medicare Other | Admitting: *Deleted

## 2011-09-28 ENCOUNTER — Other Ambulatory Visit (INDEPENDENT_AMBULATORY_CARE_PROVIDER_SITE_OTHER): Payer: Medicare Other | Admitting: *Deleted

## 2011-09-28 DIAGNOSIS — I10 Essential (primary) hypertension: Secondary | ICD-10-CM

## 2011-09-28 LAB — BASIC METABOLIC PANEL
BUN: 27 mg/dL — ABNORMAL HIGH (ref 6–23)
CO2: 25 mEq/L (ref 19–32)
Calcium: 9.2 mg/dL (ref 8.4–10.5)
Chloride: 107 mEq/L (ref 96–112)
Creatinine, Ser: 0.8 mg/dL (ref 0.4–1.2)
GFR: 70.03 mL/min (ref >60.00)
Glucose, Bld: 92 mg/dL (ref 70–99)
Potassium: 5.3 mEq/L — ABNORMAL HIGH (ref 3.5–5.1)
Sodium: 139 mEq/L (ref 135–145)

## 2011-10-03 ENCOUNTER — Telehealth: Payer: Self-pay | Admitting: *Deleted

## 2011-10-03 DIAGNOSIS — I10 Essential (primary) hypertension: Secondary | ICD-10-CM

## 2011-10-03 NOTE — Telephone Encounter (Signed)
Message copied by Lorayne Bender on Wed Oct 03, 2011 11:06 AM ------      Message from: Rosalio Macadamia      Created: Mon Oct 01, 2011  7:53 AM       Ok to report. Labs are satisfactory. Please recheck BMET on return visit.

## 2011-10-03 NOTE — Telephone Encounter (Signed)
Lm w/lab results. Will repeat Bmet on ov 10/19

## 2011-10-18 ENCOUNTER — Encounter: Payer: Self-pay | Admitting: *Deleted

## 2011-10-19 ENCOUNTER — Ambulatory Visit (INDEPENDENT_AMBULATORY_CARE_PROVIDER_SITE_OTHER): Payer: Medicare Other | Admitting: Nurse Practitioner

## 2011-10-19 ENCOUNTER — Other Ambulatory Visit: Payer: Medicare Other | Admitting: *Deleted

## 2011-10-19 ENCOUNTER — Encounter: Payer: Self-pay | Admitting: Nurse Practitioner

## 2011-10-19 VITALS — BP 136/80 | HR 72 | Ht 68.5 in | Wt 220.8 lb

## 2011-10-19 DIAGNOSIS — I10 Essential (primary) hypertension: Secondary | ICD-10-CM

## 2011-10-19 DIAGNOSIS — I2510 Atherosclerotic heart disease of native coronary artery without angina pectoris: Secondary | ICD-10-CM

## 2011-10-19 LAB — BASIC METABOLIC PANEL
BUN: 26 mg/dL — ABNORMAL HIGH (ref 6–23)
CO2: 23 mEq/L (ref 19–32)
Calcium: 9.5 mg/dL (ref 8.4–10.5)
Chloride: 103 mEq/L (ref 96–112)
Creat: 0.85 mg/dL (ref 0.50–1.10)
Glucose, Bld: 103 mg/dL — ABNORMAL HIGH (ref 70–99)
Potassium: 4.7 mEq/L (ref 3.5–5.3)
Sodium: 139 mEq/L (ref 135–145)

## 2011-10-19 NOTE — Assessment & Plan Note (Signed)
Blood pressure is coming down. Repeat by me is 140/80. She wants to try and work on her weight. We will leave her on her current regimen. We will see her back in about 3 months. She will monitor at home. Patient is agreeable to this plan and will call if any problems develop in the interim.
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
## 2011-10-19 NOTE — Assessment & Plan Note (Signed)
No chest pain reported 

## 2011-10-19 NOTE — Patient Instructions (Signed)
Stay on your current medicines.  Monitor your blood pressure at home.  Continue to work on your weight and stay active.   We will see you back in 3 months.

## 2011-10-22 ENCOUNTER — Telehealth: Payer: Self-pay | Admitting: *Deleted

## 2011-10-22 NOTE — Telephone Encounter (Signed)
Message copied by Lorayne Bender on Mon Oct 22, 2011  5:30 PM ------      Message from: Rosalio Macadamia      Created: Mon Oct 22, 2011  7:53 AM       Ok to report. Labs are satisfactory.

## 2011-10-22 NOTE — Telephone Encounter (Signed)
Lm w/lab results. Will send to Dr. Collins [place]

## 2012-01-02 ENCOUNTER — Other Ambulatory Visit: Payer: Self-pay

## 2012-01-02 MED ORDER — LOSARTAN POTASSIUM 50 MG PO TABS: 50.0000 mg | ORAL_TABLET | Freq: Two times a day (BID) | ORAL | Status: DC

## 2012-01-22 ENCOUNTER — Other Ambulatory Visit: Payer: Self-pay | Admitting: *Deleted

## 2012-01-22 MED ORDER — CLOPIDOGREL BISULFATE 75 MG PO TABS: 75.0000 mg | ORAL_TABLET | Freq: Every day | ORAL | Status: DC

## 2012-02-05 DIAGNOSIS — L97509 Non-pressure chronic ulcer of other part of unspecified foot with unspecified severity: Secondary | ICD-10-CM | POA: Diagnosis not present

## 2012-02-06 ENCOUNTER — Other Ambulatory Visit: Payer: Self-pay | Admitting: *Deleted

## 2012-02-06 MED ORDER — LOSARTAN POTASSIUM 50 MG PO TABS: 50.0000 mg | ORAL_TABLET | Freq: Two times a day (BID) | ORAL | Status: DC

## 2012-02-25 ENCOUNTER — Other Ambulatory Visit: Payer: Self-pay | Admitting: Cardiology

## 2012-02-25 NOTE — Telephone Encounter (Signed)
Refilled metoprolol 

## 2012-03-10 DIAGNOSIS — Z961 Presence of intraocular lens: Secondary | ICD-10-CM | POA: Diagnosis not present

## 2012-03-10 DIAGNOSIS — H524 Presbyopia: Secondary | ICD-10-CM | POA: Diagnosis not present

## 2012-03-10 DIAGNOSIS — H52209 Unspecified astigmatism, unspecified eye: Secondary | ICD-10-CM | POA: Diagnosis not present

## 2012-03-13 DIAGNOSIS — Z79899 Other long term (current) drug therapy: Secondary | ICD-10-CM | POA: Diagnosis not present

## 2012-03-13 DIAGNOSIS — M48061 Spinal stenosis, lumbar region without neurogenic claudication: Secondary | ICD-10-CM | POA: Diagnosis not present

## 2012-03-14 DIAGNOSIS — M5137 Other intervertebral disc degeneration, lumbosacral region: Secondary | ICD-10-CM | POA: Diagnosis not present

## 2012-03-17 ENCOUNTER — Other Ambulatory Visit: Payer: Self-pay | Admitting: Orthopedic Surgery

## 2012-03-17 DIAGNOSIS — M419 Scoliosis, unspecified: Secondary | ICD-10-CM

## 2012-03-17 DIAGNOSIS — IMO0002 Reserved for concepts with insufficient information to code with codable children: Secondary | ICD-10-CM

## 2012-03-17 DIAGNOSIS — M479 Spondylosis, unspecified: Secondary | ICD-10-CM

## 2012-03-21 ENCOUNTER — Telehealth: Payer: Self-pay

## 2012-03-21 NOTE — Telephone Encounter (Signed)
Received fax from [facility] requesting permission to hold plavix before epidural steroid injection.Spoke to Dr.Jordan he advised to wait 3 more months.[facility] notified.

## 2012-03-31 ENCOUNTER — Encounter: Payer: Self-pay | Admitting: Neurosurgery

## 2012-04-01 ENCOUNTER — Ambulatory Visit (INDEPENDENT_AMBULATORY_CARE_PROVIDER_SITE_OTHER): Payer: Medicare Other | Admitting: *Deleted

## 2012-04-01 ENCOUNTER — Encounter: Payer: Self-pay | Admitting: Neurosurgery

## 2012-04-01 ENCOUNTER — Ambulatory Visit (INDEPENDENT_AMBULATORY_CARE_PROVIDER_SITE_OTHER): Payer: Medicare Other | Admitting: Neurosurgery

## 2012-04-01 VITALS — BP 168/63 | HR 70 | Resp 20 | Ht 68.5 in | Wt 231.0 lb

## 2012-04-01 DIAGNOSIS — I6529 Occlusion and stenosis of unspecified carotid artery: Secondary | ICD-10-CM | POA: Diagnosis not present

## 2012-04-01 DIAGNOSIS — Z48812 Encounter for surgical aftercare following surgery on the circulatory system: Secondary | ICD-10-CM
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
## 2012-04-02 DIAGNOSIS — Z79899 Other long term (current) drug therapy: Secondary | ICD-10-CM | POA: Diagnosis not present

## 2012-04-02 DIAGNOSIS — I517 Cardiomegaly: Secondary | ICD-10-CM | POA: Diagnosis not present

## 2012-04-02 DIAGNOSIS — Z951 Presence of aortocoronary bypass graft: Secondary | ICD-10-CM | POA: Diagnosis not present

## 2012-04-02 DIAGNOSIS — H6120 Impacted cerumen, unspecified ear: Secondary | ICD-10-CM | POA: Diagnosis not present

## 2012-04-02 DIAGNOSIS — K625 Hemorrhage of anus and rectum: Secondary | ICD-10-CM | POA: Diagnosis not present

## 2012-04-02 DIAGNOSIS — G478 Other sleep disorders: Secondary | ICD-10-CM | POA: Diagnosis not present

## 2012-04-07 NOTE — Procedures (Unsigned)
CAROTID DUPLEX EXAM  INDICATION:  Follow up right carotid endarterectomy, left carotid disease.  HISTORY: Diabetes:  No. Cardiac:  Yes. Hypertension:  Yes. Smoking:  Previous. Previous Surgery:  Right carotid endarterectomy 2007. CV History:  Currently asymptomatic. Amaurosis Fugax No, Paresthesias No, Hemiparesis No                                      RIGHT             LEFT Brachial systolic pressure:         132               125 Brachial Doppler waveforms:         Normal            Normal Vertebral direction of flow:        Antegrade         Not visualized DUPLEX VELOCITIES (cm/sec) CCA peak systolic                   102               86 ECA peak systolic                   128               233 ICA peak systolic                   154               159 ICA end diastolic                   35                43 PLAQUE MORPHOLOGY:                  Heterogenous      Mixed PLAQUE AMOUNT:                      Moderate          Moderate PLAQUE LOCATION:                    CEA site          CCA, ICA, ECA  IMPRESSION: 1. 1-39% plaquing of the right carotid endarterectomy site. 2. Left internal carotid artery velocity suggests 40-59% stenosis. 3. Bilateral external carotid artery stenosis. 4. Stable in comparison to the previous exam.  ___________________________________________ Quita Skye. Hart Rochester, M.D.  EM/MEDQ  D:  04/02/2012  T:  04/02/2012  Job:  [id number]

## 2012-04-18 DIAGNOSIS — B359 Dermatophytosis, unspecified: Secondary | ICD-10-CM | POA: Diagnosis not present

## 2012-04-18 DIAGNOSIS — B351 Tinea unguium: Secondary | ICD-10-CM | POA: Diagnosis not present

## 2012-04-18 DIAGNOSIS — L98499 Non-pressure chronic ulcer of skin of other sites with unspecified severity: Secondary | ICD-10-CM | POA: Diagnosis not present

## 2012-04-22 DIAGNOSIS — R12 Heartburn: Secondary | ICD-10-CM | POA: Diagnosis not present

## 2012-04-22 DIAGNOSIS — Z1211 Encounter for screening for malignant neoplasm of colon: Secondary | ICD-10-CM | POA: Diagnosis not present

## 2012-05-06 ENCOUNTER — Encounter: Payer: Self-pay | Admitting: Nurse Practitioner

## 2012-05-27 DIAGNOSIS — I2510 Atherosclerotic heart disease of native coronary artery without angina pectoris: Secondary | ICD-10-CM | POA: Diagnosis not present

## 2012-05-27 DIAGNOSIS — R404 Transient alteration of awareness: Secondary | ICD-10-CM | POA: Diagnosis not present

## 2012-05-27 DIAGNOSIS — Z9889 Other specified postprocedural states: Secondary | ICD-10-CM | POA: Diagnosis not present

## 2012-06-04 DIAGNOSIS — G4733 Obstructive sleep apnea (adult) (pediatric): Secondary | ICD-10-CM | POA: Diagnosis not present

## 2012-06-10 DIAGNOSIS — K1370 Unspecified lesions of oral mucosa: Secondary | ICD-10-CM | POA: Diagnosis not present

## 2012-07-11 ENCOUNTER — Ambulatory Visit (INDEPENDENT_AMBULATORY_CARE_PROVIDER_SITE_OTHER): Payer: Medicare Other | Admitting: Nurse Practitioner

## 2012-07-11 ENCOUNTER — Encounter (HOSPITAL_COMMUNITY): Payer: Self-pay | Admitting: Pharmacy Technician

## 2012-07-11 ENCOUNTER — Encounter: Payer: Self-pay | Admitting: Nurse Practitioner

## 2012-07-11 ENCOUNTER — Other Ambulatory Visit: Payer: Self-pay | Admitting: Nurse Practitioner

## 2012-07-11 VITALS — BP 150/72 | HR 77 | Ht 68.0 in | Wt 234.6 lb

## 2012-07-11 DIAGNOSIS — Z01810 Encounter for preprocedural cardiovascular examination: Secondary | ICD-10-CM

## 2012-07-11 DIAGNOSIS — I2510 Atherosclerotic heart disease of native coronary artery without angina pectoris: Secondary | ICD-10-CM

## 2012-07-11 DIAGNOSIS — R079 Chest pain, unspecified: Secondary | ICD-10-CM

## 2012-07-11 LAB — BASIC METABOLIC PANEL
BUN: 20 mg/dL (ref 6–23)
CO2: 25 mEq/L (ref 19–32)
Calcium: 9.2 mg/dL (ref 8.4–10.5)
Chloride: 104 mEq/L (ref 96–112)
Creat: 0.83 mg/dL (ref 0.50–1.10)
Glucose, Bld: 155 mg/dL — ABNORMAL HIGH (ref 70–99)
Potassium: 4.5 mEq/L (ref 3.5–5.3)
Sodium: 137 mEq/L (ref 135–145)

## 2012-07-11 LAB — PROTIME-INR
INR: 0.95 (ref <1.50)
Prothrombin Time: 13.1 seconds (ref 11.6–15.2)

## 2012-07-11 LAB — APTT: aPTT: 34 seconds (ref 24–37)

## 2012-07-11 NOTE — Patient Instructions (Addendum)
We will call Dr. Jeralene Peters and get your EGD and colonscopy rescheduled  We need to repeat your heart catheterization  We are going to check labs today.   You are scheduled for a cardiac catheterization on Wednesday, July 17th with Dr. Excell Seltzer or associate.  Go to [facility] 2nd Floor Short Stay on Wednesday, July 17th at 9:30am. No food or drink after midnight on Tuesday. You may take your medications with a sip of water on the day of your procedure.
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
## 2012-07-11 NOTE — Assessment & Plan Note (Signed)
She has had the return of angina over the past couple of months. Using NTG with prompt relief. She is a little over one year out from her PCI. EKG is abnormal with more anterolateral changes. Will arrange for cardiac catheterization next week with Dr. Excell Seltzer in Dr. Elvis Coil absence. The procedure, risks and benefits have been reviewed with her and she is willing to proceed. Patient is agreeable to this plan and will call if any problems develop in the interim.

## 2012-07-12 LAB — CBC WITH DIFFERENTIAL/PLATELET
Basophils Absolute: 0 10*3/uL (ref 0.0–0.1)
Basophils Relative: 0 % (ref 0–1)
Eosinophils Absolute: 0.2 10*3/uL (ref 0.0–0.7)
Eosinophils Relative: 2 % (ref 0–5)
HCT: 35.2 % — ABNORMAL LOW (ref 36.0–46.0)
Hemoglobin: 11.1 g/dL — ABNORMAL LOW (ref 12.0–15.0)
Lymphocytes Relative: 21 % (ref 12–46)
Lymphs Abs: 1.9 10*3/uL (ref 0.7–4.0)
MCH: 25.8 pg — ABNORMAL LOW (ref 26.0–34.0)
MCHC: 31.5 g/dL (ref 30.0–36.0)
MCV: 81.7 fL (ref 78.0–100.0)
Monocytes Absolute: 0.6 10*3/uL (ref 0.1–1.0)
Monocytes Relative: 6 % (ref 3–12)
Neutro Abs: 6.4 10*3/uL (ref 1.7–7.7)
Neutrophils Relative %: 70 % (ref 43–77)
Platelets: 269 10*3/uL (ref 150–400)
RBC: 4.31 MIL/uL (ref 3.87–5.11)
RDW: 14.7 % (ref 11.5–15.5)
WBC: 9.1 10*3/uL (ref 4.0–10.5)

## 2012-07-16 ENCOUNTER — Ambulatory Visit (HOSPITAL_COMMUNITY)
Admission: RE | Admit: 2012-07-16 | Discharge: 2012-07-16 | Disposition: A | Discharge status: Home / Self Care | Payer: Medicare Other | Source: Ambulatory Visit | Attending: Cardiovascular Disease | Admitting: Cardiovascular Disease

## 2012-07-16 ENCOUNTER — Encounter (HOSPITAL_COMMUNITY)
Admission: RE | Disposition: A | Discharge status: Home / Self Care | Payer: Self-pay | Source: Ambulatory Visit | Attending: Cardiovascular Disease

## 2012-07-16 DIAGNOSIS — I10 Essential (primary) hypertension: Secondary | ICD-10-CM | POA: Diagnosis not present

## 2012-07-16 DIAGNOSIS — I209 Angina pectoris, unspecified: Secondary | ICD-10-CM | POA: Diagnosis not present

## 2012-07-16 DIAGNOSIS — E669 Obesity, unspecified: Secondary | ICD-10-CM | POA: Diagnosis not present

## 2012-07-16 DIAGNOSIS — Z01810 Encounter for preprocedural cardiovascular examination: Secondary | ICD-10-CM

## 2012-07-16 DIAGNOSIS — E785 Hyperlipidemia, unspecified: Secondary | ICD-10-CM | POA: Insufficient documentation

## 2012-07-16 DIAGNOSIS — I2510 Atherosclerotic heart disease of native coronary artery without angina pectoris: Secondary | ICD-10-CM

## 2012-07-16 DIAGNOSIS — Z951 Presence of aortocoronary bypass graft: Secondary | ICD-10-CM | POA: Diagnosis not present

## 2012-07-16 HISTORY — PX: LEFT HEART CATHETERIZATION WITH CORONARY/GRAFT ANGIOGRAM: SHX5450

## 2012-07-16 SURGERY — LEFT HEART CATHETERIZATION WITH CORONARY/GRAFT ANGIOGRAM
Anesthesia: LOCAL

## 2012-07-16 MED ORDER — SODIUM CHLORIDE 0.9 % IV SOLN
INTRAVENOUS | Status: DC
  Administered 2012-07-16: 11:00:00 via INTRAVENOUS

## 2012-07-16 MED ORDER — ASPIRIN 81 MG PO CHEW
324.0000 mg | CHEWABLE_TABLET | ORAL | Status: AC
  Administered 2012-07-16: 324 mg via ORAL

## 2012-07-16 MED ORDER — SODIUM CHLORIDE 0.9 % IV SOLN: 250.0000 mL | INTRAVENOUS | Status: DC | PRN

## 2012-07-16 MED ORDER — AMLODIPINE BESYLATE 5 MG PO TABS: 5.0000 mg | ORAL_TABLET | Freq: Every day | ORAL | Status: DC

## 2012-07-16 MED ORDER — ACETAMINOPHEN 325 MG PO TABS: 650.0000 mg | ORAL_TABLET | ORAL | Status: DC | PRN

## 2012-07-16 MED ORDER — SODIUM CHLORIDE 0.9 % IJ SOLN: 3.0000 mL | INTRAMUSCULAR | Status: DC | PRN

## 2012-07-16 MED ORDER — DIAZEPAM 5 MG PO TABS
5.0000 mg | ORAL_TABLET | ORAL | Status: AC
  Administered 2012-07-16: 5 mg via ORAL

## 2012-07-16 MED ORDER — SODIUM CHLORIDE 0.9 % IJ SOLN: 3.0000 mL | Freq: Two times a day (BID) | INTRAMUSCULAR | Status: DC

## 2012-07-16 MED ORDER — SODIUM CHLORIDE 0.9 % IV SOLN: 1.0000 mL/kg/h | INTRAVENOUS | Status: DC

## 2012-07-16 MED ORDER — ONDANSETRON HCL 4 MG/2ML IJ SOLN: 4.0000 mg | Freq: Four times a day (QID) | INTRAMUSCULAR | Status: DC | PRN

## 2012-07-16 NOTE — CV Procedure (Signed)
   Cardiac Catheterization Procedure Note  Name: [name] MRN: [id number] DOB: 01/09/1933  Procedure: Left Heart Cath, Selective Coronary Angiography, LV angiography, saphenous vein graft angiography  Indication: Exertional angina. This patient has known coronary artery disease. She's undergone single-vessel bypass for failed angioplasty of the right coronary artery in 2007. She underwent stenting of severe stenosis in the left circumflex about 12 months ago. She's had recurrent angina with exertion and is referred back for cardiac catheterization.   Procedural Details: The right wrist was prepped, draped, and anesthetized with 1% lidocaine. Using the modified Seldinger technique, a 5 French sheath was introduced into the right radial artery. 3 mg of verapamil was administered through the sheath, weight-based unfractionated heparin was administered intravenously. I could not pass a J-wire beyond the elbow. Angiography demonstrated a radial loop. This was navigated with a 0.014 inch cougar wire and I was able to enter the brachial artery with that wire. An exchange length wire was used for catheter exchanges. An AL-1 catheter was used for left coronary angiography and saphenous vein graft angiography. The native right coronary artery was not injected as it is known to be occluded. There were no immediate procedural complications. A TR band was used for radial hemostasis at the completion of the procedure.  The patient was transferred to the post catheterization recovery area for further monitoring.  Procedural Findings: Hemodynamics: AO 193/80 LV 196/28  Coronary angiography: Coronary dominance: right  Left mainstem: The left main is patent. There is 30% distal left main stenosis. The main stem is mildly calcified.  Left anterior descending (LAD): The LAD reaches the left ventricular apex. The vessel is diffusely calcified. There is diffuse nonobstructive plaque present. The first diagonal  is tortuous and there is a 75% diagonal stenosis about halfway down the vessel. This is in an area of tortuosity.  Left circumflex (LCx): The circumflex is of large caliber. There are 2 large obtuse marginal branches. The vessel has nonobstructive stenosis. The stented segment in the mid circumflex is widely patent with no evidence of in-stent restenosis.  Right coronary artery (RCA): Not selectively injected  Saphenous vein graft RCA: The vein graft is widely patent throughout. There is no obstructive disease. The distal anastomotic site is patent. The PDA and posterolateral branches are widely patent there  Left ventriculography: Left ventricular systolic function is vigorous, LVEF is estimated at 65-70%, there is no significant mitral regurgitation   Final Conclusions:   1. Severe single-vessel coronary artery disease involving the RCA with continued patency of the saphenous vein graft to PDA. 2. Continued patency of the stented segment in the mid circumflex 3. Nonobstructive LAD stenosis 4. Moderate stenosis of the diagonal branch 5. Preserved left ventricular systolic function  Recommendations: Medical therapy. I think her diagonal should be treated medically. It small to medium-sized vessel and the moderate lesion is in the midportion in an area of tortuosity. The patient has marked hypertension today. I'm going to amlodipine 5 mg and ask her to followup with Norma Fredrickson in a few weeks for further medication adjustment.  Tonny Bollman 07/16/2012, 2:58 PM
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
## 2012-07-16 NOTE — Interval H&P Note (Signed)
History and Physical Interval Note:  07/16/2012 2:14 PM  [name]  has presented today for surgery, with the diagnosis of chest pain  The various methods of treatment have been discussed with the patient and family. After consideration of risks, benefits and other options for treatment, the patient has consented to  Procedure(s) (LRB): LEFT HEART CATHETERIZATION WITH CORONARY/GRAFT ANGIOGRAM (N/A) as a surgical intervention .  The patient's history has been reviewed, patient examined, no change in status, stable for surgery.  I have reviewed the patients' chart and labs.  Questions were answered to the patient's satisfaction.    Previous cath films were reviewed. Procedure discussed with the patient. Her questions were answered.   Tonny Bollman

## 2012-07-17 MED FILL — Fentanyl Citrate Inj 0.05 MG/ML: INTRAMUSCULAR | Qty: 2 | Status: AC

## 2012-07-17 MED FILL — Heparin Sodium (Porcine) 2 Unit/ML in Sodium Chloride 0.9%: INTRAMUSCULAR | Qty: 2000 | Status: AC

## 2012-07-17 MED FILL — Nitroglycerin IV Soln 200 MCG/ML in D5W: INTRAVENOUS | Qty: 1 | Status: AC

## 2012-07-17 MED FILL — Lidocaine HCl Local Preservative Free (PF) Inj 1%: INTRAMUSCULAR | Qty: 30 | Status: AC

## 2012-07-17 MED FILL — Midazolam HCl Inj 2 MG/2ML (Base Equivalent): INTRAMUSCULAR | Qty: 2 | Status: AC

## 2012-07-21 DIAGNOSIS — R404 Transient alteration of awareness: Secondary | ICD-10-CM | POA: Diagnosis not present

## 2012-07-21 DIAGNOSIS — G4733 Obstructive sleep apnea (adult) (pediatric): Secondary | ICD-10-CM | POA: Diagnosis not present

## 2012-07-21 DIAGNOSIS — G4700 Insomnia, unspecified: Secondary | ICD-10-CM | POA: Diagnosis not present

## 2012-07-26 ENCOUNTER — Other Ambulatory Visit: Payer: Self-pay | Admitting: Cardiology

## 2012-07-29 ENCOUNTER — Other Ambulatory Visit: Payer: Self-pay | Admitting: *Deleted

## 2012-07-29 MED ORDER — NIACIN ER (ANTIHYPERLIPIDEMIC) 500 MG PO TBCR: EXTENDED_RELEASE_TABLET | ORAL | Status: DC

## 2012-07-30 ENCOUNTER — Ambulatory Visit
Admission: RE | Admit: 2012-07-30 | Discharge: 2012-07-30 | Disposition: A | Discharge status: Home / Self Care | Payer: Medicare Other | Source: Ambulatory Visit | Attending: Orthopedic Surgery | Admitting: Orthopedic Surgery

## 2012-07-30 ENCOUNTER — Other Ambulatory Visit: Payer: Self-pay | Admitting: Orthopedic Surgery

## 2012-07-30 DIAGNOSIS — M419 Scoliosis, unspecified: Secondary | ICD-10-CM

## 2012-07-30 DIAGNOSIS — M479 Spondylosis, unspecified: Secondary | ICD-10-CM

## 2012-07-30 DIAGNOSIS — IMO0002 Reserved for concepts with insufficient information to code with codable children: Secondary | ICD-10-CM

## 2012-07-30 DIAGNOSIS — M47817 Spondylosis without myelopathy or radiculopathy, lumbosacral region: Secondary | ICD-10-CM | POA: Diagnosis not present

## 2012-07-30 DIAGNOSIS — M549 Dorsalgia, unspecified: Secondary | ICD-10-CM

## 2012-07-30 MED ORDER — METHYLPREDNISOLONE ACETATE 40 MG/ML INJ SUSP (RADIOLOG
120.0000 mg | Freq: Once | INTRAMUSCULAR | Status: AC
  Administered 2012-07-30: 120 mg via EPIDURAL

## 2012-07-30 MED ORDER — IOHEXOL 180 MG/ML  SOLN
1.0000 mL | Freq: Once | INTRAMUSCULAR | Status: AC | PRN
  Administered 2012-07-30: 1 mL via EPIDURAL

## 2012-07-30 NOTE — Progress Notes (Signed)
Pt states she no longer has to take her plavix and will not be restarting at this time.

## 2012-07-31 ENCOUNTER — Encounter: Payer: Medicare Other | Admitting: Nurse Practitioner

## 2012-08-01 ENCOUNTER — Encounter: Payer: Self-pay | Admitting: Nurse Practitioner

## 2012-08-01 ENCOUNTER — Ambulatory Visit (INDEPENDENT_AMBULATORY_CARE_PROVIDER_SITE_OTHER): Payer: Medicare Other | Admitting: Nurse Practitioner

## 2012-08-01 VITALS — BP 184/72 | HR 75 | Ht 68.0 in | Wt 229.0 lb

## 2012-08-01 DIAGNOSIS — I2510 Atherosclerotic heart disease of native coronary artery without angina pectoris: Secondary | ICD-10-CM | POA: Diagnosis not present

## 2012-08-01 DIAGNOSIS — I10 Essential (primary) hypertension: Secondary | ICD-10-CM
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
## 2012-08-01 NOTE — Assessment & Plan Note (Signed)
Her blood pressure remains up. She is willing to start the Norvasc. I will see her back and have asked her to bring her BP cuff in for review.

## 2012-08-01 NOTE — Assessment & Plan Note (Signed)
She is doing well s/p cath. Will continue with medical management. She is willing to start the Norvasc. Will focus on risk factor modification. She is down 5 pounds. I will see her back in 2 weeks. Patient is agreeable to this plan and will call if any problems develop in the interim.

## 2012-08-01 NOTE — Patient Instructions (Signed)
Start the Norvasc 5 mg daily  I will see you in 2 weeks with your blood pressure cuff  Reschedule your colonoscopy and EGD with Dr. Jeralene Peters  Call the [facility] office at [phone] if you have any questions, problems or concerns.

## 2012-08-14 ENCOUNTER — Telehealth: Payer: Self-pay | Admitting: Cardiology

## 2012-08-14 NOTE — Telephone Encounter (Signed)
New Prboelm:    Patient called in because her amLODipine (NORVASC) 5 MG tablet is keeping her up at night with leg pains and she wanted to know if she could stop taking this medication.  Please call back.

## 2012-08-14 NOTE — Telephone Encounter (Signed)
Patient has been taken  Amlodipine 5 mg for the last 2 weeks. She has been having LE pain lately that is keeping aware at night. Pt would like to know if she can stop taken this medication. Patient has an appointment with Norma Fredrickson tomorrow on 08/15/12 at 1:30 PM she states will ask Lawson Fiscal then.

## 2012-08-15 ENCOUNTER — Encounter: Payer: Self-pay | Admitting: Nurse Practitioner

## 2012-08-15 ENCOUNTER — Ambulatory Visit (INDEPENDENT_AMBULATORY_CARE_PROVIDER_SITE_OTHER): Payer: Medicare Other | Admitting: Nurse Practitioner

## 2012-08-15 VITALS — BP 128/67 | HR 78 | Resp 18 | Ht 68.0 in | Wt 227.8 lb

## 2012-08-15 DIAGNOSIS — I10 Essential (primary) hypertension: Secondary | ICD-10-CM | POA: Diagnosis not present

## 2012-08-15 DIAGNOSIS — I2510 Atherosclerotic heart disease of native coronary artery without angina pectoris: Secondary | ICD-10-CM | POA: Diagnosis not present

## 2012-08-15 MED ORDER — LOSARTAN POTASSIUM 50 MG PO TABS: 50.0000 mg | ORAL_TABLET | Freq: Two times a day (BID) | ORAL | Status: DC

## 2012-08-15 NOTE — Patient Instructions (Addendum)
Stay off the Norvasc  Go back to the Losartan 50 mg but take it twice a day and stay on your other medicines  Monitor your blood pressure at home.  Get your EGD and colonoscopy rescheduled.  We will see you in 6 weeks. 10/10/12 @ 1:30 with Norma Fredrickson, NP  Monitor your blood pressure at home.   Call the [facility] office at [phone] if you have any questions, problems or concerns.

## 2012-08-15 NOTE — Assessment & Plan Note (Signed)
No chest pain reported. Will continue with risk factor modification.
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
## 2012-08-15 NOTE — Assessment & Plan Note (Signed)
She is down 2 pounds. I encouraged her to continue with the weight loss. Patient is agreeable to this plan and will call if any problems develop in the interim.

## 2012-08-15 NOTE — Assessment & Plan Note (Signed)
She does not want to take the Norvasc. Thinks she didn't tolerate. I have put her back on her Losartan 50 mg but will prescribe it as BID. We will see her back in 6 weeks. Patient is agreeable to this plan and will call if any problems develop in the interim.

## 2012-08-20 ENCOUNTER — Other Ambulatory Visit: Payer: Medicare Other

## 2012-09-10 DIAGNOSIS — K319 Disease of stomach and duodenum, unspecified: Secondary | ICD-10-CM | POA: Diagnosis not present

## 2012-09-10 DIAGNOSIS — K449 Diaphragmatic hernia without obstruction or gangrene: Secondary | ICD-10-CM | POA: Diagnosis not present

## 2012-09-10 DIAGNOSIS — K620 Anal polyp: Secondary | ICD-10-CM | POA: Diagnosis not present

## 2012-09-10 DIAGNOSIS — Z1211 Encounter for screening for malignant neoplasm of colon: Secondary | ICD-10-CM | POA: Diagnosis not present

## 2012-09-10 DIAGNOSIS — D126 Benign neoplasm of colon, unspecified: Secondary | ICD-10-CM | POA: Diagnosis not present

## 2012-09-10 DIAGNOSIS — R12 Heartburn: Secondary | ICD-10-CM | POA: Diagnosis not present

## 2012-09-10 DIAGNOSIS — K621 Rectal polyp: Secondary | ICD-10-CM

## 2012-09-10 HISTORY — DX: Rectal polyp: K62.1

## 2012-09-10 LAB — HM COLONOSCOPY: HM COLON: NORMAL

## 2012-09-12 DIAGNOSIS — Z23 Encounter for immunization: Secondary | ICD-10-CM | POA: Diagnosis not present

## 2012-09-12 DIAGNOSIS — D539 Nutritional anemia, unspecified: Secondary | ICD-10-CM | POA: Diagnosis not present

## 2012-09-16 DIAGNOSIS — M999 Biomechanical lesion, unspecified: Secondary | ICD-10-CM | POA: Diagnosis not present

## 2012-09-16 DIAGNOSIS — M5137 Other intervertebral disc degeneration, lumbosacral region: Secondary | ICD-10-CM | POA: Diagnosis not present

## 2012-09-16 DIAGNOSIS — M62838 Other muscle spasm: Secondary | ICD-10-CM | POA: Diagnosis not present

## 2012-09-17 DIAGNOSIS — M5137 Other intervertebral disc degeneration, lumbosacral region: Secondary | ICD-10-CM | POA: Diagnosis not present

## 2012-09-17 DIAGNOSIS — M999 Biomechanical lesion, unspecified: Secondary | ICD-10-CM | POA: Diagnosis not present

## 2012-09-17 DIAGNOSIS — M62838 Other muscle spasm: Secondary | ICD-10-CM | POA: Diagnosis not present

## 2012-09-18 DIAGNOSIS — M62838 Other muscle spasm: Secondary | ICD-10-CM | POA: Diagnosis not present

## 2012-09-18 DIAGNOSIS — M999 Biomechanical lesion, unspecified: Secondary | ICD-10-CM | POA: Diagnosis not present

## 2012-09-18 DIAGNOSIS — M5137 Other intervertebral disc degeneration, lumbosacral region: Secondary | ICD-10-CM | POA: Diagnosis not present

## 2012-09-19 DIAGNOSIS — M5137 Other intervertebral disc degeneration, lumbosacral region: Secondary | ICD-10-CM | POA: Diagnosis not present

## 2012-09-19 DIAGNOSIS — M62838 Other muscle spasm: Secondary | ICD-10-CM | POA: Diagnosis not present

## 2012-09-19 DIAGNOSIS — M999 Biomechanical lesion, unspecified: Secondary | ICD-10-CM | POA: Diagnosis not present

## 2012-09-22 DIAGNOSIS — M999 Biomechanical lesion, unspecified: Secondary | ICD-10-CM | POA: Diagnosis not present

## 2012-09-22 DIAGNOSIS — M62838 Other muscle spasm: Secondary | ICD-10-CM | POA: Diagnosis not present

## 2012-09-22 DIAGNOSIS — M5137 Other intervertebral disc degeneration, lumbosacral region: Secondary | ICD-10-CM | POA: Diagnosis not present

## 2012-09-23 DIAGNOSIS — M62838 Other muscle spasm: Secondary | ICD-10-CM | POA: Diagnosis not present

## 2012-09-23 DIAGNOSIS — M999 Biomechanical lesion, unspecified: Secondary | ICD-10-CM | POA: Diagnosis not present

## 2012-09-23 DIAGNOSIS — M5137 Other intervertebral disc degeneration, lumbosacral region: Secondary | ICD-10-CM | POA: Diagnosis not present

## 2012-09-24 DIAGNOSIS — M62838 Other muscle spasm: Secondary | ICD-10-CM | POA: Diagnosis not present

## 2012-09-24 DIAGNOSIS — M5137 Other intervertebral disc degeneration, lumbosacral region: Secondary | ICD-10-CM | POA: Diagnosis not present

## 2012-09-24 DIAGNOSIS — M999 Biomechanical lesion, unspecified: Secondary | ICD-10-CM | POA: Diagnosis not present

## 2012-09-25 DIAGNOSIS — M5137 Other intervertebral disc degeneration, lumbosacral region: Secondary | ICD-10-CM | POA: Diagnosis not present

## 2012-09-25 DIAGNOSIS — M62838 Other muscle spasm: Secondary | ICD-10-CM | POA: Diagnosis not present

## 2012-09-25 DIAGNOSIS — M999 Biomechanical lesion, unspecified: Secondary | ICD-10-CM | POA: Diagnosis not present

## 2012-09-26 DIAGNOSIS — M62838 Other muscle spasm: Secondary | ICD-10-CM | POA: Diagnosis not present

## 2012-09-26 DIAGNOSIS — M999 Biomechanical lesion, unspecified: Secondary | ICD-10-CM | POA: Diagnosis not present

## 2012-09-26 DIAGNOSIS — M5137 Other intervertebral disc degeneration, lumbosacral region: Secondary | ICD-10-CM | POA: Diagnosis not present

## 2012-10-02 DIAGNOSIS — M5137 Other intervertebral disc degeneration, lumbosacral region: Secondary | ICD-10-CM | POA: Diagnosis not present

## 2012-10-02 DIAGNOSIS — M62838 Other muscle spasm: Secondary | ICD-10-CM | POA: Diagnosis not present

## 2012-10-02 DIAGNOSIS — M999 Biomechanical lesion, unspecified: Secondary | ICD-10-CM | POA: Diagnosis not present

## 2012-10-06 DIAGNOSIS — M62838 Other muscle spasm: Secondary | ICD-10-CM | POA: Diagnosis not present

## 2012-10-06 DIAGNOSIS — M5137 Other intervertebral disc degeneration, lumbosacral region: Secondary | ICD-10-CM | POA: Diagnosis not present

## 2012-10-06 DIAGNOSIS — M999 Biomechanical lesion, unspecified: Secondary | ICD-10-CM | POA: Diagnosis not present

## 2012-10-09 DIAGNOSIS — M62838 Other muscle spasm: Secondary | ICD-10-CM | POA: Diagnosis not present

## 2012-10-09 DIAGNOSIS — M5137 Other intervertebral disc degeneration, lumbosacral region: Secondary | ICD-10-CM | POA: Diagnosis not present

## 2012-10-09 DIAGNOSIS — M999 Biomechanical lesion, unspecified: Secondary | ICD-10-CM | POA: Diagnosis not present

## 2012-10-10 ENCOUNTER — Ambulatory Visit: Payer: Medicare Other | Admitting: Nurse Practitioner

## 2012-10-15 DIAGNOSIS — M5137 Other intervertebral disc degeneration, lumbosacral region: Secondary | ICD-10-CM | POA: Diagnosis not present

## 2012-10-15 DIAGNOSIS — M62838 Other muscle spasm: Secondary | ICD-10-CM | POA: Diagnosis not present

## 2012-10-15 DIAGNOSIS — M999 Biomechanical lesion, unspecified: Secondary | ICD-10-CM | POA: Diagnosis not present

## 2012-10-17 ENCOUNTER — Ambulatory Visit (INDEPENDENT_AMBULATORY_CARE_PROVIDER_SITE_OTHER): Payer: Medicare Other | Admitting: Nurse Practitioner

## 2012-10-17 ENCOUNTER — Encounter: Payer: Self-pay | Admitting: Nurse Practitioner

## 2012-10-17 VITALS — BP 128/64 | HR 72 | Ht 68.5 in | Wt 230.1 lb

## 2012-10-17 DIAGNOSIS — I10 Essential (primary) hypertension: Secondary | ICD-10-CM | POA: Diagnosis not present

## 2012-10-17 DIAGNOSIS — M62838 Other muscle spasm: Secondary | ICD-10-CM | POA: Diagnosis not present

## 2012-10-17 DIAGNOSIS — M5137 Other intervertebral disc degeneration, lumbosacral region: Secondary | ICD-10-CM | POA: Diagnosis not present

## 2012-10-17 DIAGNOSIS — M999 Biomechanical lesion, unspecified: Secondary | ICD-10-CM | POA: Diagnosis not present
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
## 2012-10-17 NOTE — Patient Instructions (Addendum)
I think you are doing well.  Stay on your current medicines  Dr. Swaziland will see you in 4 months  Call the [facility] office at [phone] if you have any questions, problems or concerns.

## 2012-10-20 DIAGNOSIS — M62838 Other muscle spasm: Secondary | ICD-10-CM | POA: Diagnosis not present

## 2012-10-20 DIAGNOSIS — M999 Biomechanical lesion, unspecified: Secondary | ICD-10-CM | POA: Diagnosis not present

## 2012-10-20 DIAGNOSIS — M5137 Other intervertebral disc degeneration, lumbosacral region: Secondary | ICD-10-CM | POA: Diagnosis not present

## 2012-10-23 DIAGNOSIS — H26499 Other secondary cataract, unspecified eye: Secondary | ICD-10-CM | POA: Diagnosis not present

## 2012-10-23 DIAGNOSIS — H2640 Unspecified secondary cataract: Secondary | ICD-10-CM | POA: Diagnosis not present

## 2012-10-27 DIAGNOSIS — M999 Biomechanical lesion, unspecified: Secondary | ICD-10-CM | POA: Diagnosis not present

## 2012-10-27 DIAGNOSIS — M62838 Other muscle spasm: Secondary | ICD-10-CM | POA: Diagnosis not present

## 2012-10-27 DIAGNOSIS — M5137 Other intervertebral disc degeneration, lumbosacral region: Secondary | ICD-10-CM | POA: Diagnosis not present

## 2012-10-29 DIAGNOSIS — D509 Iron deficiency anemia, unspecified: Secondary | ICD-10-CM | POA: Diagnosis not present

## 2012-10-29 DIAGNOSIS — E669 Obesity, unspecified: Secondary | ICD-10-CM | POA: Diagnosis not present

## 2012-10-29 DIAGNOSIS — G4733 Obstructive sleep apnea (adult) (pediatric): Secondary | ICD-10-CM | POA: Diagnosis not present

## 2012-10-30 DIAGNOSIS — M999 Biomechanical lesion, unspecified: Secondary | ICD-10-CM | POA: Diagnosis not present

## 2012-10-30 DIAGNOSIS — M5137 Other intervertebral disc degeneration, lumbosacral region: Secondary | ICD-10-CM | POA: Diagnosis not present

## 2012-10-30 DIAGNOSIS — M62838 Other muscle spasm: Secondary | ICD-10-CM | POA: Diagnosis not present

## 2012-11-04 DIAGNOSIS — M999 Biomechanical lesion, unspecified: Secondary | ICD-10-CM | POA: Diagnosis not present

## 2012-11-04 DIAGNOSIS — F4001 Agoraphobia with panic disorder: Secondary | ICD-10-CM | POA: Diagnosis not present

## 2012-11-04 DIAGNOSIS — M5137 Other intervertebral disc degeneration, lumbosacral region: Secondary | ICD-10-CM | POA: Diagnosis not present

## 2012-11-04 DIAGNOSIS — M62838 Other muscle spasm: Secondary | ICD-10-CM | POA: Diagnosis not present

## 2012-11-11 DIAGNOSIS — L909 Atrophic disorder of skin, unspecified: Secondary | ICD-10-CM | POA: Diagnosis not present

## 2012-11-11 DIAGNOSIS — E782 Mixed hyperlipidemia: Secondary | ICD-10-CM | POA: Diagnosis not present

## 2012-11-11 DIAGNOSIS — L819 Disorder of pigmentation, unspecified: Secondary | ICD-10-CM | POA: Diagnosis not present

## 2012-11-11 DIAGNOSIS — L821 Other seborrheic keratosis: Secondary | ICD-10-CM | POA: Diagnosis not present

## 2012-11-11 DIAGNOSIS — Z419 Encounter for procedure for purposes other than remedying health state, unspecified: Secondary | ICD-10-CM | POA: Diagnosis not present

## 2012-11-11 DIAGNOSIS — L919 Hypertrophic disorder of the skin, unspecified: Secondary | ICD-10-CM | POA: Diagnosis not present

## 2012-11-11 DIAGNOSIS — L570 Actinic keratosis: Secondary | ICD-10-CM | POA: Diagnosis not present

## 2012-11-14 DIAGNOSIS — M999 Biomechanical lesion, unspecified: Secondary | ICD-10-CM | POA: Diagnosis not present

## 2012-11-14 DIAGNOSIS — M62838 Other muscle spasm: Secondary | ICD-10-CM | POA: Diagnosis not present

## 2012-11-14 DIAGNOSIS — M5137 Other intervertebral disc degeneration, lumbosacral region: Secondary | ICD-10-CM | POA: Diagnosis not present

## 2012-12-02 ENCOUNTER — Other Ambulatory Visit: Payer: Self-pay

## 2012-12-02 MED ORDER — METOPROLOL SUCCINATE ER 25 MG PO TB24: 25.0000 mg | ORAL_TABLET | Freq: Every day | ORAL | Status: DC

## 2012-12-08 DIAGNOSIS — M62838 Other muscle spasm: Secondary | ICD-10-CM | POA: Diagnosis not present

## 2012-12-08 DIAGNOSIS — M999 Biomechanical lesion, unspecified: Secondary | ICD-10-CM | POA: Diagnosis not present

## 2012-12-08 DIAGNOSIS — M5137 Other intervertebral disc degeneration, lumbosacral region: Secondary | ICD-10-CM | POA: Diagnosis not present

## 2012-12-10 DIAGNOSIS — Z79899 Other long term (current) drug therapy: Secondary | ICD-10-CM | POA: Diagnosis not present

## 2012-12-10 DIAGNOSIS — M25579 Pain in unspecified ankle and joints of unspecified foot: Secondary | ICD-10-CM | POA: Diagnosis not present

## 2012-12-17 ENCOUNTER — Other Ambulatory Visit: Payer: Self-pay | Admitting: Nurse Practitioner

## 2012-12-17 MED ORDER — NITROGLYCERIN 0.4 MG SL SUBL: 0.4000 mg | SUBLINGUAL_TABLET | SUBLINGUAL | Status: DC | PRN

## 2012-12-18 ENCOUNTER — Telehealth: Payer: Self-pay | Admitting: Cardiology

## 2012-12-18 NOTE — Telephone Encounter (Signed)
Plz return call to pt regarding nitro refill, preferred pharmacy [facility] [address].  Pt can be reached at hm#, will be going out of town.

## 2012-12-19 ENCOUNTER — Other Ambulatory Visit: Payer: Self-pay

## 2012-12-19 MED ORDER — NITROGLYCERIN 0.4 MG SL SUBL: 0.4000 mg | SUBLINGUAL_TABLET | SUBLINGUAL | Status: DC | PRN

## 2013-02-03 ENCOUNTER — Ambulatory Visit (INDEPENDENT_AMBULATORY_CARE_PROVIDER_SITE_OTHER): Payer: Medicare Other | Admitting: Gynecology

## 2013-02-03 ENCOUNTER — Encounter: Payer: Self-pay | Admitting: Gynecology

## 2013-02-03 VITALS — BP 124/80 | Ht 68.0 in | Wt 214.0 lb

## 2013-02-03 DIAGNOSIS — N952 Postmenopausal atrophic vaginitis: Secondary | ICD-10-CM | POA: Diagnosis not present

## 2013-02-03 DIAGNOSIS — N760 Acute vaginitis: Secondary | ICD-10-CM

## 2013-02-03 DIAGNOSIS — L900 Lichen sclerosus et atrophicus: Secondary | ICD-10-CM

## 2013-02-03 DIAGNOSIS — L940 Localized scleroderma [morphea]: Secondary | ICD-10-CM

## 2013-02-03 MED ORDER — FLUCONAZOLE 200 MG PO TABS: 200.0000 mg | ORAL_TABLET | Freq: Every day | ORAL | Status: DC

## 2013-02-03 MED ORDER — CLOBETASOL PROPIONATE 0.05 % EX CREA: TOPICAL_CREAM | CUTANEOUS | Status: DC

## 2013-02-03 NOTE — Patient Instructions (Addendum)
Apply steroid cream nightly for one month and then every other night for several weeks and then twice weekly for several weeks. Use at the earliest sign of a flare in symptoms.  Take Diflucan pill daily for 5 days.  Call to Schedule your mammogram  Facilities in [place]: 1)  [facility] [place], [place] [address] Phone: [phone] 2)  [facility], [phone] Phone: [phone])  Dr. Yolanda Bonine at [facility] [address] Phone: [phone]     Mammogram A mammogram is an X-ray test to find changes in a woman's breast. You should get a mammogram if:  You are 52 years of age or older  You have risk factors.   Your doctor recommends that you have one.  BEFORE THE TEST  Do not schedule the test the week before your period, especially if your breasts are sore during this time.  On the day of your mammogram:  Wash your breasts and armpits well. After washing, do not put on any deodorant or talcum powder on until after your test.   Eat and drink as you usually do.   Take your medicines as usual.   If you are diabetic and take insulin, make sure you:   Eat before coming for your test.   Take your insulin as usual.   If you cannot keep your appointment, call before the appointment to cancel. Schedule another appointment.  TEST  You will need to undress from the waist up. You will put on a hospital gown.   Your breast will be put on the mammogram machine, and it will press firmly on your breast with a piece of plastic called a compression paddle. This will make your breast flatter so that the machine can X-ray all parts of your breast.   Both breasts will be X-rayed. Each breast will be X-rayed from above and from the side. An X-ray might need to be taken again if the picture is not good enough.   The mammogram will last about 15 to 30 minutes.  AFTER THE TEST Finding out  the results of your test Ask when your test results will be ready. Make sure you get your test results.  Document Released: 03/15/2009 Document Revised: 12/06/2011 Document Reviewed: 03/15/2009 [facility] Patient Information 2012 [place], [place].

## 2013-02-03 NOTE — Progress Notes (Signed)
[name] Aug 22, 1933 [id number]        79 y.o.  G3P3003 for follow up exam.  Notes a significant flare in her lichen sclerosus.  Past medical history,surgical history, medications, allergies, family history and social history were all reviewed and documented in the EPIC chart. ROS:  Was performed and pertinent positives and negatives are included in the history.  Exam: Kim assistant Filed Vitals:   02/03/13 1125  BP: 124/80  Height: 5\' 8"  (1.727 m)  Weight: 214 lb (97.07 kg)   General appearance  Normal Skin grossly normal Head/Neck normal with no cervical or supraclavicular adenopathy thyroid normal Lungs  clear Cardiac RR, without RMG Abdominal  soft, nontender, without masses, organomegaly or hernia Breasts  examined lying and sitting without masses, retractions, discharge or axillary adenopathy. Pelvic  Ext/BUS/vagina  With atrophic changes. Symmetrical white changes periclitoral to perianal consistent with her lichen sclerosis. Generalized inflammatory changes particularly perianal. No specific lesions.   Cervix  normal atrophic  Uterus  Difficult to palpate but grossly normal size, nontender   Adnexa  Without gross masses or tenderness    Anus and perineum  General Inflammatory changes with white-ish skin changes  Rectovaginal  normal sphincter tone without palpated masses or tenderness.    Assessment/Plan:  79 y.o. G3P3003 female.   1. Lichen sclerosus with flare. She had run out of her Temovate 0.05% cream. Temovate 0.05% cream prescribed. We'll use nightly for 1 month then every other night for several weeks then twice weekly for another several weeks. Emphasized the need to start early with flares. Due to the moisture and general inflammatory change on covering her for yeast with Diflucan 200 daily for 5 days. Follow up if symptoms persist or worsen. 2. Bone health. DEXA 2010 normal. Plan repeat at 5 your interval in 2015. Increase calcium vitamin D  reviewed. 3. Mammography. Patient has not had a mammogram in a number of years. I again strongly recommended she schedule this and the importance of early detection reviewed. SBE monthly reviewed. 4. Colonoscopy 2013. Follow up at their recommended interval. 5. Pap smear 2010. No Pap smear done today. No history of significant abnormal Pap smears. Reviewed current screening guidelines and she is over the age of 35 and we elected to stop screening. 6. Health maintenance. No blood work done this is all done through her primary physician's office. Follow up one year, sooner if her symptoms persist or worsen.    Dara Lords MD, 11:54 AM 02/03/2013

## 2013-02-04 LAB — URINALYSIS W MICROSCOPIC + REFLEX CULTURE
Bacteria, UA: NONE SEEN
Casts: NONE SEEN
Crystals: NONE SEEN
Glucose, UA: NEGATIVE mg/dL
Hgb urine dipstick: NEGATIVE
Leukocytes, UA: NEGATIVE
Nitrite: NEGATIVE
Protein, ur: 30 mg/dL — AB
Specific Gravity, Urine: 1.03 (ref 1.005–1.030)
Squamous Epithelial / HPF: NONE SEEN
Urobilinogen, UA: 0.2 mg/dL (ref 0.0–1.0)
pH: 5.5 (ref 5.0–8.0)

## 2013-02-06 ENCOUNTER — Encounter: Payer: Medicare Other | Admitting: Gynecology

## 2013-02-09 ENCOUNTER — Encounter: Payer: Medicare Other | Admitting: Gynecology

## 2013-02-26 ENCOUNTER — Ambulatory Visit: Payer: Medicare Other | Admitting: Cardiology

## 2013-03-09 DIAGNOSIS — M999 Biomechanical lesion, unspecified: Secondary | ICD-10-CM | POA: Diagnosis not present

## 2013-03-25 ENCOUNTER — Ambulatory Visit (INDEPENDENT_AMBULATORY_CARE_PROVIDER_SITE_OTHER): Payer: Medicare Other | Admitting: Cardiology

## 2013-03-25 ENCOUNTER — Encounter: Payer: Self-pay | Admitting: Cardiology

## 2013-03-25 VITALS — BP 192/80 | HR 70 | Ht 68.0 in | Wt 234.0 lb

## 2013-03-25 DIAGNOSIS — E785 Hyperlipidemia, unspecified: Secondary | ICD-10-CM | POA: Diagnosis not present

## 2013-03-25 DIAGNOSIS — I2510 Atherosclerotic heart disease of native coronary artery without angina pectoris: Secondary | ICD-10-CM

## 2013-03-25 DIAGNOSIS — I10 Essential (primary) hypertension: Secondary | ICD-10-CM | POA: Diagnosis not present

## 2013-03-25 DIAGNOSIS — R42 Dizziness and giddiness: Secondary | ICD-10-CM

## 2013-03-25 NOTE — Patient Instructions (Signed)
Continue your current therapy  I will see you again in 6 months.   
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
## 2013-04-03 DIAGNOSIS — L03039 Cellulitis of unspecified toe: Secondary | ICD-10-CM | POA: Diagnosis not present

## 2013-04-03 DIAGNOSIS — L97509 Non-pressure chronic ulcer of other part of unspecified foot with unspecified severity: Secondary | ICD-10-CM | POA: Diagnosis not present

## 2013-04-06 ENCOUNTER — Other Ambulatory Visit (INDEPENDENT_AMBULATORY_CARE_PROVIDER_SITE_OTHER): Payer: Medicare Other | Admitting: Vascular Surgery

## 2013-04-06 DIAGNOSIS — Z48812 Encounter for surgical aftercare following surgery on the circulatory system: Secondary | ICD-10-CM

## 2013-04-06 DIAGNOSIS — I6529 Occlusion and stenosis of unspecified carotid artery: Secondary | ICD-10-CM

## 2013-04-07 ENCOUNTER — Other Ambulatory Visit: Payer: Self-pay | Admitting: *Deleted

## 2013-04-07 ENCOUNTER — Other Ambulatory Visit: Payer: Medicare Other

## 2013-04-07 ENCOUNTER — Ambulatory Visit: Payer: Medicare Other | Admitting: Neurosurgery

## 2013-04-07 ENCOUNTER — Other Ambulatory Visit: Payer: Self-pay

## 2013-04-08 ENCOUNTER — Encounter: Payer: Self-pay | Admitting: Vascular Surgery

## 2013-04-15 ENCOUNTER — Other Ambulatory Visit: Payer: Self-pay | Admitting: *Deleted

## 2013-04-15 MED ORDER — LOSARTAN POTASSIUM 50 MG PO TABS: 50.0000 mg | ORAL_TABLET | Freq: Two times a day (BID) | ORAL | Status: DC

## 2013-04-23 DIAGNOSIS — L03039 Cellulitis of unspecified toe: Secondary | ICD-10-CM | POA: Diagnosis not present

## 2013-04-23 DIAGNOSIS — L97509 Non-pressure chronic ulcer of other part of unspecified foot with unspecified severity: Secondary | ICD-10-CM | POA: Diagnosis not present

## 2013-04-25 DIAGNOSIS — J209 Acute bronchitis, unspecified: Secondary | ICD-10-CM | POA: Diagnosis not present

## 2013-04-29 DIAGNOSIS — J209 Acute bronchitis, unspecified: Secondary | ICD-10-CM | POA: Diagnosis not present

## 2013-04-30 DIAGNOSIS — J209 Acute bronchitis, unspecified: Secondary | ICD-10-CM | POA: Diagnosis not present

## 2013-06-15 DIAGNOSIS — M62838 Other muscle spasm: Secondary | ICD-10-CM | POA: Diagnosis not present

## 2013-06-15 DIAGNOSIS — M999 Biomechanical lesion, unspecified: Secondary | ICD-10-CM | POA: Diagnosis not present

## 2013-06-15 DIAGNOSIS — M5137 Other intervertebral disc degeneration, lumbosacral region: Secondary | ICD-10-CM | POA: Diagnosis not present

## 2013-06-16 ENCOUNTER — Other Ambulatory Visit: Payer: Self-pay | Admitting: *Deleted

## 2013-06-16 MED ORDER — METOPROLOL SUCCINATE ER 25 MG PO TB24: 25.0000 mg | ORAL_TABLET | Freq: Every day | ORAL | Status: DC

## 2013-06-18 DIAGNOSIS — M5137 Other intervertebral disc degeneration, lumbosacral region: Secondary | ICD-10-CM | POA: Diagnosis not present

## 2013-06-18 DIAGNOSIS — M62838 Other muscle spasm: Secondary | ICD-10-CM | POA: Diagnosis not present

## 2013-06-18 DIAGNOSIS — M999 Biomechanical lesion, unspecified: Secondary | ICD-10-CM | POA: Diagnosis not present

## 2013-06-25 DIAGNOSIS — M999 Biomechanical lesion, unspecified: Secondary | ICD-10-CM | POA: Diagnosis not present

## 2013-06-25 DIAGNOSIS — M62838 Other muscle spasm: Secondary | ICD-10-CM | POA: Diagnosis not present

## 2013-06-25 DIAGNOSIS — M5137 Other intervertebral disc degeneration, lumbosacral region: Secondary | ICD-10-CM | POA: Diagnosis not present

## 2013-06-30 DIAGNOSIS — H5310 Unspecified subjective visual disturbances: Secondary | ICD-10-CM | POA: Diagnosis not present

## 2013-07-20 DIAGNOSIS — M999 Biomechanical lesion, unspecified: Secondary | ICD-10-CM | POA: Diagnosis not present

## 2013-07-20 DIAGNOSIS — M5137 Other intervertebral disc degeneration, lumbosacral region: Secondary | ICD-10-CM | POA: Diagnosis not present

## 2013-07-20 DIAGNOSIS — M62838 Other muscle spasm: Secondary | ICD-10-CM | POA: Diagnosis not present

## 2013-07-30 DIAGNOSIS — Z79899 Other long term (current) drug therapy: Secondary | ICD-10-CM | POA: Diagnosis not present

## 2013-08-03 ENCOUNTER — Other Ambulatory Visit: Payer: Self-pay | Admitting: Cardiovascular Disease

## 2013-08-04 DIAGNOSIS — D509 Iron deficiency anemia, unspecified: Secondary | ICD-10-CM | POA: Diagnosis not present

## 2013-08-04 DIAGNOSIS — D539 Nutritional anemia, unspecified: Secondary | ICD-10-CM | POA: Diagnosis not present

## 2013-08-04 DIAGNOSIS — G4733 Obstructive sleep apnea (adult) (pediatric): Secondary | ICD-10-CM | POA: Diagnosis not present

## 2013-08-04 DIAGNOSIS — M48062 Spinal stenosis, lumbar region with neurogenic claudication: Secondary | ICD-10-CM | POA: Diagnosis not present

## 2013-08-04 DIAGNOSIS — E669 Obesity, unspecified: Secondary | ICD-10-CM | POA: Diagnosis not present

## 2013-08-12 ENCOUNTER — Telehealth: Payer: Self-pay | Admitting: Cardiology

## 2013-08-12 NOTE — Telephone Encounter (Signed)
Returned call to patient she stated she had chest pain last night.Stated took NTG x 1 with relief.Stated she takes care of her sick husband.Stated she has been having achy feeling in chest.Stated she would like to be seen.Appointment scheduled with Dr.Jordan 08/13/13.Advised to go to ER if needed.

## 2013-08-12 NOTE — Telephone Encounter (Signed)
New prob  Pt states she had some angina and she would like to speak with a nurse.

## 2013-08-13 ENCOUNTER — Encounter: Payer: Self-pay | Admitting: Cardiology

## 2013-08-13 ENCOUNTER — Ambulatory Visit (INDEPENDENT_AMBULATORY_CARE_PROVIDER_SITE_OTHER): Payer: Medicare Other | Admitting: Cardiology

## 2013-08-13 VITALS — BP 140/80 | HR 68 | Ht 68.0 in | Wt 226.0 lb

## 2013-08-13 DIAGNOSIS — I10 Essential (primary) hypertension: Secondary | ICD-10-CM | POA: Diagnosis not present

## 2013-08-13 DIAGNOSIS — E785 Hyperlipidemia, unspecified: Secondary | ICD-10-CM | POA: Diagnosis not present

## 2013-08-13 DIAGNOSIS — I2510 Atherosclerotic heart disease of native coronary artery without angina pectoris: Secondary | ICD-10-CM
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
## 2013-08-13 NOTE — Patient Instructions (Signed)
Continue your current therapy  I will see you in 6 months.   

## 2013-08-21 DIAGNOSIS — M779 Enthesopathy, unspecified: Secondary | ICD-10-CM | POA: Diagnosis not present

## 2013-08-21 DIAGNOSIS — L97509 Non-pressure chronic ulcer of other part of unspecified foot with unspecified severity: Secondary | ICD-10-CM | POA: Diagnosis not present

## 2013-09-16 DIAGNOSIS — D509 Iron deficiency anemia, unspecified: Secondary | ICD-10-CM | POA: Diagnosis not present

## 2013-09-18 DIAGNOSIS — M2040 Other hammer toe(s) (acquired), unspecified foot: Secondary | ICD-10-CM | POA: Diagnosis not present

## 2013-09-18 DIAGNOSIS — L97509 Non-pressure chronic ulcer of other part of unspecified foot with unspecified severity: Secondary | ICD-10-CM | POA: Diagnosis not present

## 2013-09-18 DIAGNOSIS — Z79899 Other long term (current) drug therapy: Secondary | ICD-10-CM | POA: Diagnosis not present

## 2013-09-18 DIAGNOSIS — M779 Enthesopathy, unspecified: Secondary | ICD-10-CM | POA: Diagnosis not present

## 2013-09-30 ENCOUNTER — Ambulatory Visit: Payer: Medicare Other | Admitting: Cardiology

## 2013-10-14 DIAGNOSIS — D509 Iron deficiency anemia, unspecified: Secondary | ICD-10-CM | POA: Diagnosis not present

## 2013-10-30 DIAGNOSIS — Z23 Encounter for immunization: Secondary | ICD-10-CM | POA: Diagnosis not present

## 2013-10-30 DIAGNOSIS — M999 Biomechanical lesion, unspecified: Secondary | ICD-10-CM | POA: Diagnosis not present

## 2013-10-30 DIAGNOSIS — M5137 Other intervertebral disc degeneration, lumbosacral region: Secondary | ICD-10-CM | POA: Diagnosis not present

## 2013-10-30 DIAGNOSIS — M5030 Other cervical disc degeneration, unspecified cervical region: Secondary | ICD-10-CM | POA: Diagnosis not present

## 2013-10-30 DIAGNOSIS — D509 Iron deficiency anemia, unspecified: Secondary | ICD-10-CM | POA: Diagnosis not present

## 2013-10-30 DIAGNOSIS — M545 Low back pain: Secondary | ICD-10-CM | POA: Diagnosis not present

## 2013-10-30 DIAGNOSIS — M9981 Other biomechanical lesions of cervical region: Secondary | ICD-10-CM | POA: Diagnosis not present

## 2013-10-30 DIAGNOSIS — N289 Disorder of kidney and ureter, unspecified: Secondary | ICD-10-CM | POA: Diagnosis not present

## 2013-10-30 DIAGNOSIS — F3289 Other specified depressive episodes: Secondary | ICD-10-CM | POA: Diagnosis not present

## 2013-11-03 DIAGNOSIS — F4001 Agoraphobia with panic disorder: Secondary | ICD-10-CM | POA: Diagnosis not present

## 2013-11-23 DIAGNOSIS — M9981 Other biomechanical lesions of cervical region: Secondary | ICD-10-CM | POA: Diagnosis not present

## 2013-11-23 DIAGNOSIS — M5030 Other cervical disc degeneration, unspecified cervical region: Secondary | ICD-10-CM | POA: Diagnosis not present

## 2013-11-23 DIAGNOSIS — M5137 Other intervertebral disc degeneration, lumbosacral region: Secondary | ICD-10-CM | POA: Diagnosis not present

## 2013-11-23 DIAGNOSIS — M999 Biomechanical lesion, unspecified: Secondary | ICD-10-CM | POA: Diagnosis not present

## 2013-11-30 DIAGNOSIS — I4891 Unspecified atrial fibrillation: Secondary | ICD-10-CM

## 2013-11-30 HISTORY — DX: Unspecified atrial fibrillation: I48.91

## 2013-12-08 ENCOUNTER — Other Ambulatory Visit: Payer: Self-pay

## 2013-12-08 MED ORDER — CLOBETASOL PROPIONATE 0.05 % EX CREA: TOPICAL_CREAM | CUTANEOUS | Status: DC

## 2014-01-02 ENCOUNTER — Emergency Department (HOSPITAL_COMMUNITY): Payer: Medicare Other

## 2014-01-02 ENCOUNTER — Observation Stay (HOSPITAL_COMMUNITY): Payer: Medicare Other

## 2014-01-02 ENCOUNTER — Inpatient Hospital Stay (HOSPITAL_COMMUNITY)
Admission: EM | Admit: 2014-01-02 | Discharge: 2014-01-05 | DRG: 246 | Disposition: A | Discharge status: Home / Self Care | Payer: Medicare Other | Attending: Cardiology | Admitting: Cardiology

## 2014-01-02 ENCOUNTER — Encounter (HOSPITAL_COMMUNITY): Payer: Self-pay | Admitting: Emergency Medicine

## 2014-01-02 DIAGNOSIS — Z7902 Long term (current) use of antithrombotics/antiplatelets: Secondary | ICD-10-CM | POA: Diagnosis not present

## 2014-01-02 DIAGNOSIS — R9389 Abnormal findings on diagnostic imaging of other specified body structures: Secondary | ICD-10-CM | POA: Diagnosis present

## 2014-01-02 DIAGNOSIS — Z951 Presence of aortocoronary bypass graft: Secondary | ICD-10-CM | POA: Diagnosis not present

## 2014-01-02 DIAGNOSIS — E785 Hyperlipidemia, unspecified: Secondary | ICD-10-CM

## 2014-01-02 DIAGNOSIS — Z8673 Personal history of transient ischemic attack (TIA), and cerebral infarction without residual deficits: Secondary | ICD-10-CM

## 2014-01-02 DIAGNOSIS — Z87891 Personal history of nicotine dependence: Secondary | ICD-10-CM | POA: Diagnosis not present

## 2014-01-02 DIAGNOSIS — Z780 Asymptomatic menopausal state: Secondary | ICD-10-CM

## 2014-01-02 DIAGNOSIS — I509 Heart failure, unspecified: Secondary | ICD-10-CM | POA: Diagnosis present

## 2014-01-02 DIAGNOSIS — I130 Hypertensive heart and chronic kidney disease with heart failure and stage 1 through stage 4 chronic kidney disease, or unspecified chronic kidney disease: Secondary | ICD-10-CM | POA: Diagnosis present

## 2014-01-02 DIAGNOSIS — R079 Chest pain, unspecified: Secondary | ICD-10-CM | POA: Diagnosis present

## 2014-01-02 DIAGNOSIS — E1139 Type 2 diabetes mellitus with other diabetic ophthalmic complication: Secondary | ICD-10-CM | POA: Diagnosis present

## 2014-01-02 DIAGNOSIS — R911 Solitary pulmonary nodule: Secondary | ICD-10-CM | POA: Diagnosis present

## 2014-01-02 DIAGNOSIS — E669 Obesity, unspecified: Secondary | ICD-10-CM | POA: Diagnosis present

## 2014-01-02 DIAGNOSIS — D509 Iron deficiency anemia, unspecified: Secondary | ICD-10-CM | POA: Diagnosis present

## 2014-01-02 DIAGNOSIS — I10 Essential (primary) hypertension: Secondary | ICD-10-CM | POA: Diagnosis not present

## 2014-01-02 DIAGNOSIS — Z79899 Other long term (current) drug therapy: Secondary | ICD-10-CM

## 2014-01-02 DIAGNOSIS — Z6834 Body mass index (BMI) 34.0-34.9, adult: Secondary | ICD-10-CM

## 2014-01-02 DIAGNOSIS — Z955 Presence of coronary angioplasty implant and graft: Secondary | ICD-10-CM

## 2014-01-02 DIAGNOSIS — I4891 Unspecified atrial fibrillation: Secondary | ICD-10-CM | POA: Diagnosis present

## 2014-01-02 DIAGNOSIS — T82897A Other specified complication of cardiac prosthetic devices, implants and grafts, initial encounter: Secondary | ICD-10-CM | POA: Diagnosis present

## 2014-01-02 DIAGNOSIS — Y831 Surgical operation with implant of artificial internal device as the cause of abnormal reaction of the patient, or of later complication, without mention of misadventure at the time of the procedure: Secondary | ICD-10-CM | POA: Diagnosis present

## 2014-01-02 DIAGNOSIS — N183 Chronic kidney disease, stage 3 (moderate): Secondary | ICD-10-CM

## 2014-01-02 DIAGNOSIS — I214 Non-ST elevation (NSTEMI) myocardial infarction: Secondary | ICD-10-CM | POA: Diagnosis not present

## 2014-01-02 DIAGNOSIS — J90 Pleural effusion, not elsewhere classified: Secondary | ICD-10-CM | POA: Diagnosis not present

## 2014-01-02 DIAGNOSIS — M4120 Other idiopathic scoliosis, site unspecified: Secondary | ICD-10-CM | POA: Diagnosis present

## 2014-01-02 DIAGNOSIS — Z7982 Long term (current) use of aspirin: Secondary | ICD-10-CM | POA: Diagnosis not present

## 2014-01-02 DIAGNOSIS — I5031 Acute diastolic (congestive) heart failure: Secondary | ICD-10-CM | POA: Diagnosis not present

## 2014-01-02 DIAGNOSIS — Z888 Allergy status to other drugs, medicaments and biological substances status: Secondary | ICD-10-CM

## 2014-01-02 DIAGNOSIS — Z9861 Coronary angioplasty status: Secondary | ICD-10-CM

## 2014-01-02 DIAGNOSIS — I2510 Atherosclerotic heart disease of native coronary artery without angina pectoris: Secondary | ICD-10-CM | POA: Diagnosis present

## 2014-01-02 DIAGNOSIS — I200 Unstable angina: Secondary | ICD-10-CM

## 2014-01-02 DIAGNOSIS — I2582 Chronic total occlusion of coronary artery: Secondary | ICD-10-CM | POA: Diagnosis present

## 2014-01-02 DIAGNOSIS — Z8249 Family history of ischemic heart disease and other diseases of the circulatory system: Secondary | ICD-10-CM

## 2014-01-02 DIAGNOSIS — Z7901 Long term (current) use of anticoagulants: Secondary | ICD-10-CM

## 2014-01-02 DIAGNOSIS — I5032 Chronic diastolic (congestive) heart failure: Secondary | ICD-10-CM | POA: Diagnosis present

## 2014-01-02 DIAGNOSIS — I059 Rheumatic mitral valve disease, unspecified: Secondary | ICD-10-CM | POA: Diagnosis not present

## 2014-01-02 DIAGNOSIS — Z882 Allergy status to sulfonamides status: Secondary | ICD-10-CM

## 2014-01-02 HISTORY — DX: Anemia, unspecified: D64.9

## 2014-01-02 HISTORY — DX: Dizziness and giddiness: R42

## 2014-01-02 LAB — POCT I-STAT TROPONIN I: Troponin i, poc: 0.09 ng/mL (ref 0.00–0.08)

## 2014-01-02 LAB — COMPREHENSIVE METABOLIC PANEL
ALBUMIN: 3.6 g/dL (ref 3.5–5.2)
ALK PHOS: 88 U/L (ref 39–117)
ALT: 58 U/L — AB (ref 0–35)
AST: 44 U/L — AB (ref 0–37)
BUN: 18 mg/dL (ref 6–23)
CO2: 22 mEq/L (ref 19–32)
Calcium: 9 mg/dL (ref 8.4–10.5)
Chloride: 102 mEq/L (ref 96–112)
Creatinine, Ser: 0.67 mg/dL (ref 0.50–1.10)
GFR calc Af Amer: >90 mL/min (ref >90)
GFR calc non Af Amer: 82 mL/min — ABNORMAL LOW (ref >90)
Glucose, Bld: 154 mg/dL — ABNORMAL HIGH (ref 70–99)
POTASSIUM: 4.9 meq/L (ref 3.7–5.3)
SODIUM: 138 meq/L (ref 137–147)
TOTAL PROTEIN: 6.9 g/dL (ref 6.0–8.3)
Total Bilirubin: 0.5 mg/dL (ref 0.3–1.2)

## 2014-01-02 LAB — CBC WITH DIFFERENTIAL/PLATELET
BASOS ABS: 0 10*3/uL (ref 0.0–0.1)
BASOS PCT: 0 % (ref 0–1)
EOS ABS: 0.1 10*3/uL (ref 0.0–0.7)
Eosinophils Relative: 1 % (ref 0–5)
HCT: 36.4 % (ref 36.0–46.0)
Hemoglobin: 11.6 g/dL — ABNORMAL LOW (ref 12.0–15.0)
LYMPHS ABS: 1 10*3/uL (ref 0.7–4.0)
Lymphocytes Relative: 9 % — ABNORMAL LOW (ref 12–46)
MCH: 28.1 pg (ref 26.0–34.0)
MCHC: 31.9 g/dL (ref 30.0–36.0)
MCV: 88.1 fL (ref 78.0–100.0)
Monocytes Absolute: 0.8 10*3/uL (ref 0.1–1.0)
Monocytes Relative: 7 % (ref 3–12)
NEUTROS PCT: 83 % — AB (ref 43–77)
Neutro Abs: 9.4 10*3/uL — ABNORMAL HIGH (ref 1.7–7.7)
PLATELETS: 277 10*3/uL (ref 150–400)
RBC: 4.13 MIL/uL (ref 3.87–5.11)
RDW: 14.2 % (ref 11.5–15.5)
WBC: 11.2 10*3/uL — ABNORMAL HIGH (ref 4.0–10.5)

## 2014-01-02 LAB — TROPONIN I
TROPONIN I: 1.05 ng/mL — AB (ref <0.30)
Troponin I: <0.3 ng/mL (ref <0.30)
Troponin I: 1.09 ng/mL (ref <0.30)

## 2014-01-02 LAB — HEPARIN LEVEL (UNFRACTIONATED): Heparin Unfractionated: 0.35 IU/mL (ref 0.30–0.70)

## 2014-01-02 LAB — PRO B NATRIURETIC PEPTIDE: Pro B Natriuretic peptide (BNP): 3700 pg/mL — ABNORMAL HIGH (ref 0–450)

## 2014-01-02 LAB — PROTIME-INR
INR: 1.07 (ref 0.00–1.49)
Prothrombin Time: 13.7 seconds (ref 11.6–15.2)

## 2014-01-02 LAB — LIPASE, BLOOD: Lipase: 30 U/L (ref 11–59)

## 2014-01-02 MED ORDER — SODIUM CHLORIDE 0.9 % IJ SOLN
3.0000 mL | Freq: Two times a day (BID) | INTRAMUSCULAR | Status: DC
  Administered 2014-01-02 – 2014-01-03 (×3): 3 mL via INTRAVENOUS

## 2014-01-02 MED ORDER — HEPARIN BOLUS VIA INFUSION
4000.0000 [IU] | Freq: Once | INTRAVENOUS | Status: AC
  Administered 2014-01-02: 4000 [IU] via INTRAVENOUS
  Filled 2014-01-02: qty 4000

## 2014-01-02 MED ORDER — NITROGLYCERIN 0.4 MG SL SUBL: 0.4000 mg | SUBLINGUAL_TABLET | SUBLINGUAL | Status: DC | PRN

## 2014-01-02 MED ORDER — FLUOXETINE HCL 20 MG PO TABS
20.0000 mg | ORAL_TABLET | ORAL | Status: DC
  Administered 2014-01-04: 20 mg via ORAL
  Filled 2014-01-02 (×2): qty 1

## 2014-01-02 MED ORDER — FUROSEMIDE 10 MG/ML IJ SOLN
40.0000 mg | Freq: Once | INTRAMUSCULAR | Status: AC
  Administered 2014-01-02: 40 mg via INTRAVENOUS
  Filled 2014-01-02: qty 4

## 2014-01-02 MED ORDER — ASPIRIN EC 81 MG PO TBEC
81.0000 mg | DELAYED_RELEASE_TABLET | Freq: Every day | ORAL | Status: DC
  Administered 2014-01-03 – 2014-01-05 (×3): 81 mg via ORAL
  Filled 2014-01-02 (×3): qty 1

## 2014-01-02 MED ORDER — HEPARIN (PORCINE) IN NACL 100-0.45 UNIT/ML-% IJ SOLN
1600.0000 [IU]/h | INTRAMUSCULAR | Status: DC
  Administered 2014-01-02 – 2014-01-03 (×2): 1250 [IU]/h via INTRAVENOUS
  Administered 2014-01-04: 1350 [IU]/h via INTRAVENOUS
  Filled 2014-01-02 (×5): qty 250

## 2014-01-02 MED ORDER — ONDANSETRON HCL 4 MG/2ML IJ SOLN: 4.0000 mg | Freq: Four times a day (QID) | INTRAMUSCULAR | Status: DC | PRN

## 2014-01-02 MED ORDER — SODIUM CHLORIDE 0.9 % IJ SOLN: 3.0000 mL | INTRAMUSCULAR | Status: DC | PRN

## 2014-01-02 MED ORDER — IOHEXOL 300 MG/ML  SOLN
80.0000 mL | Freq: Once | INTRAMUSCULAR | Status: AC | PRN
  Administered 2014-01-02: 80 mL via INTRAVENOUS

## 2014-01-02 MED ORDER — LOSARTAN POTASSIUM 50 MG PO TABS
50.0000 mg | ORAL_TABLET | Freq: Two times a day (BID) | ORAL | Status: DC
  Administered 2014-01-02 – 2014-01-05 (×6): 50 mg via ORAL
  Filled 2014-01-02 (×11): qty 1

## 2014-01-02 MED ORDER — SODIUM CHLORIDE 0.9 % IV SOLN: 250.0000 mL | INTRAVENOUS | Status: DC | PRN

## 2014-01-02 MED ORDER — ALPRAZOLAM 0.5 MG PO TABS
0.5000 mg | ORAL_TABLET | Freq: Every evening | ORAL | Status: DC | PRN
  Administered 2014-01-02 – 2014-01-03 (×2): 0.5 mg via ORAL
  Filled 2014-01-02 (×2): qty 1

## 2014-01-02 MED ORDER — NITROGLYCERIN 2 % TD OINT
1.0000 [in_us] | TOPICAL_OINTMENT | Freq: Four times a day (QID) | TRANSDERMAL | Status: DC
  Administered 2014-01-02: 1 [in_us] via TOPICAL
  Filled 2014-01-02: qty 1

## 2014-01-02 MED ORDER — METOPROLOL SUCCINATE ER 25 MG PO TB24
25.0000 mg | ORAL_TABLET | Freq: Every day | ORAL | Status: DC
  Administered 2014-01-02 – 2014-01-05 (×4): 25 mg via ORAL
  Filled 2014-01-02 (×7): qty 1

## 2014-01-02 MED ORDER — OMEGA-3-ACID ETHYL ESTERS 1 G PO CAPS
1.0000 g | ORAL_CAPSULE | Freq: Every day | ORAL | Status: DC
  Administered 2014-01-03 – 2014-01-05 (×3): 1 g via ORAL
  Filled 2014-01-02 (×4): qty 1

## 2014-01-02 MED ORDER — OMEGA-3 FATTY ACIDS 1000 MG PO CAPS: 1.0000 g | ORAL_CAPSULE | Freq: Every day | ORAL | Status: DC

## 2014-01-02 MED ORDER — NITROGLYCERIN 2 % TD OINT
0.5000 [in_us] | TOPICAL_OINTMENT | Freq: Four times a day (QID) | TRANSDERMAL | Status: DC
  Administered 2014-01-02: 0.5 [in_us] via TOPICAL
  Filled 2014-01-02: qty 30

## 2014-01-02 MED ORDER — FERROUS SULFATE 325 (65 FE) MG PO TABS
325.0000 mg | ORAL_TABLET | Freq: Two times a day (BID) | ORAL | Status: DC
  Administered 2014-01-03 – 2014-01-05 (×4): 325 mg via ORAL
  Filled 2014-01-02 (×9): qty 1
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
## 2014-01-02 NOTE — ED Notes (Signed)
Patient arrives this morning with complaint of mid-chest pain. Describes pain that started last night after eating some pizza. Goes further to explain that she didn't feel well throughout the day yesterday. Took 2 SL Nitro tabs last evening with relief, but pain returned this morning. Currently patient is pain free without further intervention. Due to cardiac history patient felt it important to get pain checked out. Denies any other cardiac symptom, NAD, VS WDL.

## 2014-01-02 NOTE — ED Provider Notes (Signed)
Medical screening examination/treatment/procedure(s) were conducted as a shared visit with non-physician practitioner(s) and myself.  I personally evaluated the patient during the encounter.  EKG Interpretation    Date/Time:  Saturday January 02 2014 06:28:45 EST Ventricular Rate:  73 PR Interval:    QRS Duration: 81 QT Interval:  427 QTC Calculation: 470 R Axis:   -43 Text Interpretation:  Atrial fibrillation Left axis deviation Anterior infarct, old Nonspecific repol abnormality, lateral leads afib new since previous Confirmed by YAO  MD, DAVID [phone]) on 01/02/2014 6:36:06 AM             Patient here with chest pain. Began last night, she thought it was indigestion, relieved after multiple GI meds and 2 NTG. Patient here chest pain free, relaxing comfortably. Lungs clear. Initial troponin mildly elevated, BNP elevated. Lasix given. Cards admitting.  Osvaldo Shipper, MD 01/02/14 1150

## 2014-01-02 NOTE — ED Notes (Signed)
Old and New EKG given to Dr Darl Householder
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
## 2014-01-02 NOTE — Significant Event (Signed)
CRITICAL VALUE ALERT  Critical value received:  Troponin 1.05  Date of notification:  01/02/14  Time of notification: 2336  Critical value read back:yes  Nurse who received alert:  Dwaine Gale RN  MD notified (1st page):  Dr. Ashok Norris  Time of first page:  1712  MD notified (2nd page):  Time of second page:  Responding MD:  Dr Darene Lamer. Turner  Time MD responded:  1224- no new orders received

## 2014-01-02 NOTE — Progress Notes (Signed)
ANTICOAGULATION CONSULT NOTE - Follow Up Consult  Pharmacy Consult for Heparin Indication: chest pain/ACS and Afib  Allergies  Allergen Reactions  . Statins Other (See Comments)    Muscle soreness  . Sulfa Drugs Cross Reactors Itching  . Zetia [Ezetimibe] Other (See Comments)    Muscle soreness  . Ace Inhibitors     Intolerance per Dr. Doug Sou note  . Amlodipine     Intolerance per Dr. Doug Sou note   . Fenofibrate Other (See Comments)    Muscle soreness    Patient Measurements: Height: 5\' 8"  (172.7 cm) Weight: 227 lb 1.6 oz (103.012 kg) IBW/kg (Calculated) : 63.9 Heparin Dosing Weight: 88kg  Vital Signs: Temp: 97.9 F (36.6 C) (01/03 2013) Temp src: Oral (01/03 2013) BP: 163/77 mmHg (01/03 2013) Pulse Rate: 63 (01/03 2013)  Labs:  Recent Labs  01/02/14 0620 01/02/14 0705 01/02/14 1500 01/02/14 2020  HGB 11.6*  --   --   --   HCT 36.4  --   --   --   PLT 277  --   --   --   LABPROT  --   --  13.7  --   INR  --   --  1.07  --   HEPARINUNFRC  --   --   --  0.35  CREATININE 0.67  --   --   --   TROPONINI  --  <0.30 1.05*  --     Estimated Creatinine Clearance: 72.7 ml/min (by C-G formula based on Cr of 0.67).   Medications:  Heparin 1250 units/hr  Assessment: 80yof on heparin for ACS and Afib. Heparin level (0.35) is therapeutic - will continue current rate and follow-up AM heparin level and CBC. - H/H and Plts wnl - No significant bleeding reported  Goal of Therapy:  Heparin level 0.3-0.7 units/ml Monitor platelets by anticoagulation protocol: Yes   Plan:  1. Continue heparin drip 1250 units/hr (12.5 ml/hr) 2. Follow-up AM heparin level and CBC  Earleen Newport [phone] 01/02/2014,8:48 PM

## 2014-01-03 DIAGNOSIS — I5031 Acute diastolic (congestive) heart failure: Secondary | ICD-10-CM | POA: Diagnosis not present

## 2014-01-03 DIAGNOSIS — I2582 Chronic total occlusion of coronary artery: Secondary | ICD-10-CM | POA: Diagnosis not present

## 2014-01-03 DIAGNOSIS — I2510 Atherosclerotic heart disease of native coronary artery without angina pectoris: Secondary | ICD-10-CM | POA: Diagnosis not present

## 2014-01-03 DIAGNOSIS — I10 Essential (primary) hypertension: Secondary | ICD-10-CM | POA: Diagnosis not present

## 2014-01-03 DIAGNOSIS — T82897A Other specified complication of cardiac prosthetic devices, implants and grafts, initial encounter: Secondary | ICD-10-CM | POA: Diagnosis not present

## 2014-01-03 DIAGNOSIS — I214 Non-ST elevation (NSTEMI) myocardial infarction: Secondary | ICD-10-CM

## 2014-01-03 DIAGNOSIS — I4891 Unspecified atrial fibrillation: Secondary | ICD-10-CM | POA: Diagnosis not present

## 2014-01-03 DIAGNOSIS — I059 Rheumatic mitral valve disease, unspecified: Secondary | ICD-10-CM

## 2014-01-03 LAB — CBC
HEMATOCRIT: 34.8 % — AB (ref 36.0–46.0)
HEMOGLOBIN: 11.3 g/dL — AB (ref 12.0–15.0)
MCH: 28 pg (ref 26.0–34.0)
MCHC: 32.5 g/dL (ref 30.0–36.0)
MCV: 86.4 fL (ref 78.0–100.0)
Platelets: 268 10*3/uL (ref 150–400)
RBC: 4.03 MIL/uL (ref 3.87–5.11)
RDW: 13.9 % (ref 11.5–15.5)
WBC: 10.5 10*3/uL (ref 4.0–10.5)

## 2014-01-03 LAB — COMPREHENSIVE METABOLIC PANEL
ALT: 52 U/L — ABNORMAL HIGH (ref 0–35)
AST: 33 U/L (ref 0–37)
Albumin: 3.5 g/dL (ref 3.5–5.2)
Alkaline Phosphatase: 81 U/L (ref 39–117)
BUN: 18 mg/dL (ref 6–23)
CO2: 23 mEq/L (ref 19–32)
Calcium: 9 mg/dL (ref 8.4–10.5)
Chloride: 98 mEq/L (ref 96–112)
Creatinine, Ser: 0.67 mg/dL (ref 0.50–1.10)
GFR calc non Af Amer: 82 mL/min — ABNORMAL LOW (ref >90)
GLUCOSE: 124 mg/dL — AB (ref 70–99)
Potassium: 4.4 mEq/L (ref 3.7–5.3)
Sodium: 134 mEq/L — ABNORMAL LOW (ref 137–147)
TOTAL PROTEIN: 6.4 g/dL (ref 6.0–8.3)
Total Bilirubin: 0.6 mg/dL (ref 0.3–1.2)

## 2014-01-03 LAB — LIPID PANEL
Cholesterol: 178 mg/dL (ref 0–200)
HDL: 34 mg/dL — ABNORMAL LOW (ref >39)
LDL CALC: 116 mg/dL — AB (ref 0–99)
TRIGLYCERIDES: 141 mg/dL (ref <150)
Total CHOL/HDL Ratio: 5.2 RATIO
VLDL: 28 mg/dL (ref 0–40)

## 2014-01-03 LAB — HEPARIN LEVEL (UNFRACTIONATED): Heparin Unfractionated: 0.32 IU/mL (ref 0.30–0.70)

## 2014-01-03 LAB — TROPONIN I: Troponin I: 0.76 ng/mL (ref <0.30)

## 2014-01-03 LAB — T4, FREE: Free T4: 1.12 ng/dL (ref 0.80–1.80)

## 2014-01-03 LAB — TSH: TSH: 2.351 u[IU]/mL (ref 0.350–4.500)

## 2014-01-03 LAB — HEMOGLOBIN A1C
HEMOGLOBIN A1C: 7.1 % — AB (ref <5.7)
Mean Plasma Glucose: 157 mg/dL — ABNORMAL HIGH (ref <117)

## 2014-01-03 MED ORDER — SODIUM CHLORIDE 0.9 % IV SOLN: 250.0000 mL | INTRAVENOUS | Status: DC | PRN

## 2014-01-03 MED ORDER — SODIUM CHLORIDE 0.9 % IJ SOLN: 3.0000 mL | INTRAMUSCULAR | Status: DC | PRN

## 2014-01-03 MED ORDER — SODIUM CHLORIDE 0.9 % IJ SOLN: 3.0000 mL | Freq: Two times a day (BID) | INTRAMUSCULAR | Status: DC

## 2014-01-03 MED ORDER — NITROGLYCERIN 2 % TD OINT
0.5000 [in_us] | TOPICAL_OINTMENT | TRANSDERMAL | Status: DC
  Administered 2014-01-03 – 2014-01-04 (×4): 0.5 [in_us] via TOPICAL

## 2014-01-03 MED ORDER — SODIUM CHLORIDE 0.9 % IV SOLN
INTRAVENOUS | Status: DC
  Administered 2014-01-04: 02:00:00 via INTRAVENOUS

## 2014-01-03 NOTE — Progress Notes (Signed)
Pt will not used bed pan while on bed rest but has agreed to use [facility], with assistance from RN or Nurse Tech.  Bed alarm on, call light in reach, RN will continue to monitor.   Nolon Nations, RN

## 2014-01-03 NOTE — Progress Notes (Signed)
Utilization Review completed.  

## 2014-01-03 NOTE — Progress Notes (Signed)
Primary cardiologist: Dr. Peter [name]  Subjective:    No indigestion/chest pain symptoms. No palpitations or shortness of breath.  Objective:   Temp:  [97.8 F (36.6 C)-98 F (36.7 C)] 98 F (36.7 C) (01/04 0455) Pulse Rate:  [63-80] 67 (01/04 0455) Resp:  [18-22] 18 (01/04 0455) BP: (108-174)/(38-107) 153/63 mmHg (01/04 0455) SpO2:  [94 %-100 %] 94 % (01/04 0455) Weight:  [227 lb 1 oz (102.994 kg)-227 lb 1.6 oz (103.012 kg)] 227 lb 1 oz (102.994 kg) (01/04 0455) Last BM Date: 01/03/14  Filed Weights   01/02/14 1339 01/03/14 0455  Weight: 227 lb 1.6 oz (103.012 kg) 227 lb 1 oz (102.994 kg)    Intake/Output Summary (Last 24 hours) at 01/03/14 0818 Last data filed at 01/03/14 0614  Gross per 24 hour  Intake      0 ml  Output   1350 ml  Net  -1350 ml    Telemetry: Atrial fibrillation.  Exam:  General: Comfortable, in no distress  Lungs: Decreased breath sounds but clear, nonlabored.  Cardiac: Irregular, no gallop or rub.  Abdomen: NABS.  Extremities: No pitting.   Lab Results:  Basic Metabolic Panel:  Recent Labs Lab 01/02/14 0620 01/03/14 0215  NA 138 134*  K 4.9 4.4  CL 102 98  CO2 22 23  GLUCOSE 154* 124*  BUN 18 18  CREATININE 0.67 0.67  CALCIUM 9.0 9.0    Liver Function Tests:  Recent Labs Lab 01/02/14 0620 01/03/14 0215  AST 44* 33  ALT 58* 52*  ALKPHOS 88 81  BILITOT 0.5 0.6  PROT 6.9 6.4  ALBUMIN 3.6 3.5    CBC:  Recent Labs Lab 01/02/14 0620 01/03/14 0215  WBC 11.2* 10.5  HGB 11.6* 11.3*  HCT 36.4 34.8*  MCV 88.1 86.4  PLT 277 268    Cardiac Enzymes:  Recent Labs Lab 01/02/14 1500 01/02/14 2020 01/03/14 0215  TROPONINI 1.05* 1.09* 0.76*    BNP:  Recent Labs  01/02/14 0705  PROBNP 3700.0*    ECG: Controlled atrial fibrillation with nonspecific ST changes and leftward axis.   Imaging: CT CHEST WITH CONTRAST  TECHNIQUE:  Multidetector CT imaging of the chest was performed during  intravenous  contrast administration.  CONTRAST: 32mL OMNIPAQUE IOHEXOL 300 MG/ML SOLN  COMPARISON: Chest radiograph on 01/02/2014  FINDINGS:  Tiny right pleural effusion versus pleural thickening noted. No  evidence of left-sided pleural effusion or pericardial effusion.  No evidence of pulmonary infiltrate or central endobronchial lesion.  No suspicious pulmonary nodules or masses are identified.  Eight moderate size hiatal hernia is seen. No evidence of hilar or  mediastinal lymphadenopathy. No adenopathy seen elsewhere within the  thorax. Both adrenal glands are normal in appearance.  IMPRESSION:  Tiny right pleural effusion versus pleural thickening.  No evidence of pulmonary neoplasm.  Moderate hiatal hernia.   Medications:   Scheduled Medications: . aspirin EC  81 mg Oral Daily  . ferrous sulfate  325 mg Oral BID WC  . [START ON 01/04/2014] FLUoxetine  20 mg Oral 3 times weekly  . losartan  50 mg Oral BID  . metoprolol succinate  25 mg Oral Daily  . nitroGLYCERIN  0.5 inch Topical Custom  . omega-3 acid ethyl esters  1 g Oral Daily  . sodium chloride  3 mL Intravenous Q12H     Infusions: . heparin 1,250 Units/hr (01/03/14 0707)     PRN Medications:  sodium chloride, ALPRAZolam, nitroGLYCERIN, ondansetron (ZOFRAN) IV, sodium chloride  Assessment:   1. NSTEMI, peak troponin I 1.09, ECG with nonspecific ST changes.  2. Multivessel CAD status post single vessel CABG in 2007 following failed RCA angioplasty, subsequent DES to the circumflex in 2012.  3. No evidence of lung nodule by chest CT following abnormal chest x-ray.  4. Atrial fibrillation, newly documented and of uncertain duration. CHADSVASC 5.   Plan/Discussion:    Continue aspirin, Toprol-XL, Cozaar, heparin. Echocardiogram pending for assessment of LVEF. We have discussed arranging a cardiac catheterization for tomorrow to assess her coronary anatomy and evaluate for revascularization options. She is in agreement  to proceed. It is important to note that she has concurrently documented atrial fibrillation and needs to be considered for oral anticoagulation. This will likely have some bearing on percutaneous treatment options and antiplatelet regimen.Satira Sark, M.D., F.A.C.C.

## 2014-01-03 NOTE — Progress Notes (Signed)
Echocardiogram 2D Echocardiogram has been performed.  Joelene Millin 01/03/2014, 10:21 AM

## 2014-01-03 NOTE — Progress Notes (Signed)
ANTICOAGULATION CONSULT NOTE - Follow Up Consult  Pharmacy Consult for  Heparin Indication: chest pain/ACS and atrial fibrillation  Allergies  Allergen Reactions  . Statins Other (See Comments)    Muscle soreness  . Sulfa Drugs Cross Reactors Itching  . Zetia [Ezetimibe] Other (See Comments)    Muscle soreness  . Ace Inhibitors     Intolerance per Dr. Doug Sou note  . Amlodipine     Intolerance per Dr. Doug Sou note   . Fenofibrate Other (See Comments)    Muscle soreness    Patient Measurements: Height: 5\' 8"  (172.7 cm) Weight: 227 lb 1 oz (102.994 kg) IBW/kg (Calculated) : 63.9 Heparin Dosing Weight: 87.7 kg  Vital Signs: Temp: 98 F (36.7 C) (01/04 1330) Temp src: Oral (01/04 0455) BP: 101/54 mmHg (01/04 1330) Pulse Rate: 80 (01/04 1330)  Labs:  Recent Labs  01/02/14 0620  01/02/14 1500 01/02/14 2020 01/03/14 0215  HGB 11.6*  --   --   --  11.3*  HCT 36.4  --   --   --  34.8*  PLT 277  --   --   --  268  LABPROT  --   --  13.7  --   --   INR  --   --  1.07  --   --   HEPARINUNFRC  --   --   --  0.35 0.32  CREATININE 0.67  --   --   --  0.67  TROPONINI  --   < > 1.05* 1.09* 0.76*  < > = values in this interval not displayed.  Estimated Creatinine Clearance: 72.7 ml/min (by C-G formula based on Cr of 0.67).  Assessment:   Heparin level is low therapeutic on 1250 units/hr.  CBC stable.  NSTEMI, for cath on 1/5.  Goal of Therapy:  Heparin level 0.3-0.7 units/ml Monitor platelets by anticoagulation protocol: Yes   Plan:    Increase heparin drip to 1350 units/hr, to try to keep level in target range.   Next heparin level and CBC in am.  Arty Baumgartner, [place] Pager: [phone] 01/03/2014,3:25 PM

## 2014-01-04 ENCOUNTER — Encounter (HOSPITAL_COMMUNITY)
Admission: EM | Disposition: A | Discharge status: Home / Self Care | Payer: Medicare Other | Source: Home / Self Care | Attending: Cardiology

## 2014-01-04 DIAGNOSIS — T82897A Other specified complication of cardiac prosthetic devices, implants and grafts, initial encounter: Secondary | ICD-10-CM | POA: Diagnosis not present

## 2014-01-04 DIAGNOSIS — I4891 Unspecified atrial fibrillation: Secondary | ICD-10-CM | POA: Diagnosis not present

## 2014-01-04 DIAGNOSIS — I5031 Acute diastolic (congestive) heart failure: Secondary | ICD-10-CM | POA: Diagnosis not present

## 2014-01-04 DIAGNOSIS — I214 Non-ST elevation (NSTEMI) myocardial infarction: Secondary | ICD-10-CM

## 2014-01-04 DIAGNOSIS — I2582 Chronic total occlusion of coronary artery: Secondary | ICD-10-CM | POA: Diagnosis not present

## 2014-01-04 DIAGNOSIS — I2510 Atherosclerotic heart disease of native coronary artery without angina pectoris: Secondary | ICD-10-CM | POA: Diagnosis not present

## 2014-01-04 HISTORY — PX: PERCUTANEOUS CORONARY STENT INTERVENTION (PCI-S): SHX5485

## 2014-01-04 HISTORY — PX: CORONARY STENT PLACEMENT: SHX1402

## 2014-01-04 HISTORY — PX: LEFT HEART CATHETERIZATION WITH CORONARY ANGIOGRAM: SHX5451

## 2014-01-04 LAB — CBC
HEMATOCRIT: 33.4 % — AB (ref 36.0–46.0)
Hemoglobin: 10.8 g/dL — ABNORMAL LOW (ref 12.0–15.0)
MCH: 28 pg (ref 26.0–34.0)
MCHC: 32.3 g/dL (ref 30.0–36.0)
MCV: 86.5 fL (ref 78.0–100.0)
Platelets: 245 10*3/uL (ref 150–400)
RBC: 3.86 MIL/uL — ABNORMAL LOW (ref 3.87–5.11)
RDW: 14 % (ref 11.5–15.5)
WBC: 9.1 10*3/uL (ref 4.0–10.5)

## 2014-01-04 LAB — BASIC METABOLIC PANEL
BUN: 20 mg/dL (ref 6–23)
CALCIUM: 8.2 mg/dL — AB (ref 8.4–10.5)
CO2: 23 mEq/L (ref 19–32)
Chloride: 103 mEq/L (ref 96–112)
Creatinine, Ser: 0.68 mg/dL (ref 0.50–1.10)
GFR calc Af Amer: >90 mL/min (ref >90)
GFR, EST NON AFRICAN AMERICAN: 82 mL/min — AB (ref >90)
GLUCOSE: 135 mg/dL — AB (ref 70–99)
Potassium: 4.3 mEq/L (ref 3.7–5.3)
SODIUM: 139 meq/L (ref 137–147)

## 2014-01-04 LAB — HEPARIN LEVEL (UNFRACTIONATED): Heparin Unfractionated: 0.19 IU/mL — ABNORMAL LOW (ref 0.30–0.70)

## 2014-01-04 LAB — POCT ACTIVATED CLOTTING TIME
ACTIVATED CLOTTING TIME: 514 s
Activated Clotting Time: 193 seconds

## 2014-01-04 SURGERY — LEFT HEART CATHETERIZATION WITH CORONARY ANGIOGRAM
Anesthesia: LOCAL | Site: Hand

## 2014-01-04 MED ORDER — LIDOCAINE HCL (PF) 1 % IJ SOLN
INTRAMUSCULAR | Status: AC
  Filled 2014-01-04: qty 30

## 2014-01-04 MED ORDER — SODIUM CHLORIDE 0.9 % IJ SOLN: 3.0000 mL | INTRAMUSCULAR | Status: DC | PRN

## 2014-01-04 MED ORDER — SODIUM CHLORIDE 0.9 % IV SOLN: 1.0000 mL/kg/h | INTRAVENOUS | Status: AC

## 2014-01-04 MED ORDER — BIVALIRUDIN 250 MG IV SOLR
INTRAVENOUS | Status: AC
  Filled 2014-01-04: qty 250

## 2014-01-04 MED ORDER — CLOPIDOGREL BISULFATE 300 MG PO TABS
ORAL_TABLET | ORAL | Status: AC
  Filled 2014-01-04: qty 1

## 2014-01-04 MED ORDER — MIDAZOLAM HCL 2 MG/2ML IJ SOLN
INTRAMUSCULAR | Status: AC
  Filled 2014-01-04: qty 2

## 2014-01-04 MED ORDER — VERAPAMIL HCL 2.5 MG/ML IV SOLN
INTRAVENOUS | Status: AC
  Filled 2014-01-04: qty 2

## 2014-01-04 MED ORDER — SODIUM CHLORIDE 0.9 % IV SOLN
250.0000 mL | INTRAVENOUS | Status: DC | PRN
Start: 2014-01-04 — End: 2014-01-05

## 2014-01-04 MED ORDER — WARFARIN - PHARMACIST DOSING INPATIENT: Freq: Every day | Status: DC

## 2014-01-04 MED ORDER — HEPARIN (PORCINE) IN NACL 2-0.9 UNIT/ML-% IJ SOLN
INTRAMUSCULAR | Status: AC
  Filled 2014-01-04: qty 1000

## 2014-01-04 MED ORDER — HYDROMORPHONE HCL PF 2 MG/ML IJ SOLN
INTRAMUSCULAR | Status: AC
  Filled 2014-01-04: qty 1

## 2014-01-04 MED ORDER — NITROGLYCERIN 0.2 MG/ML ON CALL CATH LAB
INTRAVENOUS | Status: AC
  Filled 2014-01-04: qty 1

## 2014-01-04 MED ORDER — HEPARIN (PORCINE) IN NACL 2-0.9 UNIT/ML-% IJ SOLN
INTRAMUSCULAR | Status: AC
  Filled 2014-01-04: qty 500

## 2014-01-04 MED ORDER — CLOPIDOGREL BISULFATE 75 MG PO TABS
75.0000 mg | ORAL_TABLET | Freq: Every day | ORAL | Status: DC
  Administered 2014-01-05: 75 mg via ORAL
  Filled 2014-01-04: qty 1

## 2014-01-04 MED ORDER — FENTANYL CITRATE 0.05 MG/ML IJ SOLN
INTRAMUSCULAR | Status: AC
  Filled 2014-01-04: qty 2

## 2014-01-04 MED ORDER — WARFARIN SODIUM 5 MG PO TABS
5.0000 mg | ORAL_TABLET | Freq: Once | ORAL | Status: AC
  Administered 2014-01-04: 5 mg via ORAL
  Filled 2014-01-04: qty 1

## 2014-01-04 MED ORDER — SODIUM CHLORIDE 0.9 % IJ SOLN: 3.0000 mL | Freq: Two times a day (BID) | INTRAMUSCULAR | Status: DC

## 2014-01-04 MED ORDER — ACETAMINOPHEN 325 MG PO TABS: 650.0000 mg | ORAL_TABLET | ORAL | Status: DC | PRN

## 2014-01-04 MED ORDER — HEPARIN SODIUM (PORCINE) 1000 UNIT/ML IJ SOLN
INTRAMUSCULAR | Status: AC
  Filled 2014-01-04: qty 1

## 2014-01-04 NOTE — Progress Notes (Signed)
ANTICOAGULATION CONSULT NOTE - Follow Up Consult  Pharmacy Consult for heparin Indication: atrial fibrillation and NSTEMI   Labs:  Recent Labs  01/02/14 0620  01/02/14 1500 01/02/14 2020 01/03/14 0215 01/04/14 0425  HGB 11.6*  --   --   --  11.3* 10.8*  HCT 36.4  --   --   --  34.8* 33.4*  PLT 277  --   --   --  268 245  LABPROT  --   --  13.7  --   --   --   INR  --   --  1.07  --   --   --   HEPARINUNFRC  --   --   --  0.35 0.32 0.19*  CREATININE 0.67  --   --   --  0.67 0.68  TROPONINI  --   < > 1.05* 1.09* 0.76*  --   < > = values in this interval not displayed.   Assessment: 80yo female now subtherapeutic on heparin after two levels at low end of goal; plan for cath this am.  Goal of Therapy:  Heparin level 0.3-0.7 units/ml   Plan:  Will increase heparin gtt by 2-3 units/kg/hr to 1600 units/hr and f/u after cath.  Wynona Neat, PharmD, BCPS  01/04/2014,6:52 AM

## 2014-01-04 NOTE — H&P (View-Only) (Signed)
Patient Name: [name] Date of Encounter: 01/04/2014     Active Problems:   HTN (hypertension)   Chest pain   Coronary artery disease   AF (atrial fibrillation)   Acute diastolic CHF (congestive heart failure)   Nodular radiologic density   Hyperglycemia   Unstable angina   Pulmonary nodule   NSTEMI (non-ST elevated myocardial infarction)    SUBJECTIVE The patient has had no further chest pain.  She is awaiting cardiac catheterization this morning.  Rhythm remains atrial fibrillation.  Atrial fibrillation is a new rhythm for her.  She has not previously been on long-term anticoagulation.  CURRENT MEDS . aspirin EC  81 mg Oral Daily  . ferrous sulfate  325 mg Oral BID WC  . FLUoxetine  20 mg Oral 3 times weekly  . losartan  50 mg Oral BID  . metoprolol succinate  25 mg Oral Daily  . nitroGLYCERIN  0.5 inch Topical Custom  . omega-3 acid ethyl esters  1 g Oral Daily  . sodium chloride  3 mL Intravenous Q12H  . sodium chloride  3 mL Intravenous Q12H    OBJECTIVE  Filed Vitals:   01/03/14 0455 01/03/14 1330 01/03/14 2100 01/04/14 0500  BP: 153/63 101/54 138/63 152/89  Pulse: 67 80 64 67  Temp: 98 F (36.7 C) 98 F (36.7 C) 97.8 F (36.6 C) 98 F (36.7 C)  TempSrc: Oral  Oral Oral  Resp: 18 16 18 18   Height:      Weight: 227 lb 1 oz (102.994 kg)   228 lb (103.42 kg)  SpO2: 94% 98% 98% 97%    Intake/Output Summary (Last 24 hours) at 01/04/14 0913 Last data filed at 01/04/14 0659  Gross per 24 hour  Intake 1063.67 ml  Output    600 ml  Net 463.67 ml   Filed Weights   01/02/14 1339 01/03/14 0455 01/04/14 0500  Weight: 227 lb 1.6 oz (103.012 kg) 227 lb 1 oz (102.994 kg) 228 lb (103.42 kg)    PHYSICAL EXAM  General: Pleasant, NAD. Neuro: Alert and oriented X 3. Moves all extremities spontaneously. Psych: Normal affect. HEENT:  Normal  Neck: Supple without bruits or JVD. Lungs:  Resp regular and unlabored, CTA. Heart: Pulse is irregularly irregular  in atrial fibrillation Abdomen: Soft, non-tender, non-distended, BS + x 4.  Extremities: No clubbing, cyanosis or edema. DP/PT/Radials 2+ and equal bilaterally.  Accessory Clinical Findings  CBC  Recent Labs  01/02/14 0620 01/03/14 0215 01/04/14 0425  WBC 11.2* 10.5 9.1  NEUTROABS 9.4*  --   --   HGB 11.6* 11.3* 10.8*  HCT 36.4 34.8* 33.4*  MCV 88.1 86.4 86.5  PLT 277 268 829   Basic Metabolic Panel  Recent Labs  01/03/14 0215 01/04/14 0425  NA 134* 139  K 4.4 4.3  CL 98 103  CO2 23 23  GLUCOSE 124* 135*  BUN 18 20  CREATININE 0.67 0.68  CALCIUM 9.0 8.2*   Liver Function Tests  Recent Labs  01/02/14 0620 01/03/14 0215  AST 44* 33  ALT 58* 52*  ALKPHOS 88 81  BILITOT 0.5 0.6  PROT 6.9 6.4  ALBUMIN 3.6 3.5    Recent Labs  01/02/14 1500  LIPASE 30   Cardiac Enzymes  Recent Labs  01/02/14 1500 01/02/14 2020 01/03/14 0215  TROPONINI 1.05* 1.09* 0.76*   BNP No components found with this basename: POCBNP,  D-Dimer No results found for this basename: DDIMER,  in the last 72 hours  Hemoglobin A1C  Recent Labs  01/02/14 1500  HGBA1C 7.1*   Fasting Lipid Panel  Recent Labs  01/03/14 0215  CHOL 178  HDL 34*  LDLCALC 116*  TRIG 141  CHOLHDL 5.2   Thyroid Function Tests  Recent Labs  01/02/14 1500  TSH 2.351    TELE  Atrial fibrillation with controlled ventricular response  ECG  2-D echo: - Left ventricle: The cavity size was normal. Wall thickness was increased in a pattern of mild LVH. Systolic function was vigorous. The estimated ejection fraction was in the range of 70% to 75%. Wall motion was normal; there were no regional wall motion abnormalities. The study is not technically sufficient to allow evaluation of LV diastolic function. - Aortic valve: Mildly calcified annulus. Trileaflet; moderately calcified leaflets. Mean gradient: 48mm Hg (S). Valve area: 2.25cm^2(VTI). Peak velocity ratio of LVOT to aortic valve:  0.74. - Mitral valve: Calcified annulus. Mildly thickened leaflets . Mild regurgitation. - Left atrium: The atrium was mildly dilated. - Right atrium: Central venous pressure: 75mm Hg (est). - Tricuspid valve: Mild regurgitation. - Pulmonary arteries: PA peak pressure: 62mm Hg (S). - Pericardium, extracardiac: There was no pericardial effusion. Impressions:  - Mild LVH with LVEF 70-75%, indeterminate diastolic function. Mild left atrial enlargement. MAC with mild mitral regurgitation. Sclerotic aortic valve - increased gradient likely due to increased flow. MIld tricuspid regurgitation with PASP 13 mmHg.   Radiology/Studies  Dg Chest 2 View  01/02/2014   CLINICAL DATA:  chest pain,  EXAM: CHEST  2 VIEW  COMPARISON:  DG EPIDUROGRAM S+I dated 07/30/2012; DG CHEST 2 VIEW dated 06/26/2011  FINDINGS: Sternotomy wires overlie stable enlarged cardiac silhouette. There is interval increase in central venous pulmonary congestion. There are small bilateral pleural effusions which are also noted. There is a nodular density measuring 9 mm along the right horizontal fissure. No focal consolidation. No pneumothorax. Degenerative osteophytosis of the thoracic spine.  IMPRESSION: 1. Interval increase in central venous pulmonary congestion and small bilateral pleural effusions. 2. New nodular density in the right midlung. Recommend CT thorax to evaluate for pulmonary nodule.   Electronically Signed   By: Suzy Bouchard M.D.   On: 01/02/2014 07:10   Ct Chest W Contrast  01/02/2014   CLINICAL DATA:  Chest pain.  Right lung nodule on chest radiograph.  EXAM: CT CHEST WITH CONTRAST  TECHNIQUE: Multidetector CT imaging of the chest was performed during intravenous contrast administration.  CONTRAST:  85mL OMNIPAQUE IOHEXOL 300 MG/ML  SOLN  COMPARISON:  Chest radiograph on 01/02/2014  FINDINGS: Tiny right pleural effusion versus pleural thickening noted. No evidence of left-sided pleural effusion or pericardial  effusion.  No evidence of pulmonary infiltrate or central endobronchial lesion. No suspicious pulmonary nodules or masses are identified.  Eight moderate size hiatal hernia is seen. No evidence of hilar or mediastinal lymphadenopathy. No adenopathy seen elsewhere within the thorax. Both adrenal glands are normal in appearance.  IMPRESSION: Tiny right pleural effusion versus pleural thickening.  No evidence of pulmonary neoplasm.  Moderate hiatal hernia.   Electronically Signed   By: Earle Gell M.D.   On: 01/02/2014 19:21    ASSESSMENT AND PLAN 1. NSTEMI, peak troponin I 1.09, ECG with nonspecific ST changes.  2. Multivessel CAD status post single vessel CABG in 2007 following failed RCA angioplasty, subsequent DES to the circumflex in 2012.  3. No evidence of lung nodule by chest CT following abnormal chest x-ray.  4. Atrial fibrillation, newly documented and of uncertain  duration. CHADSVASC 5.  Plan: Cardiac catheterization today.It is important to note that she has concurrently documented atrial fibrillation and needs to be considered for oral anticoagulation. This will likely have some bearing on percutaneous treatment options and antiplatelet regimen..   Signed, Darlin Coco MD

## 2014-01-04 NOTE — Progress Notes (Signed)
Coumadin per pharmacy  Anticoagulation - ACS/CP and afib-> NSTEMI. HL 0.19.  Was on heparin at 1600 units/hr Unk duration of afib, CHADsVASC = 5. S/p cath, starting coumadin per pharmacy.  Coumadin score = 4  Goal INR 2-3  Plan: Coumadin 5mg  po x1 Daily PT/INR

## 2014-01-04 NOTE — Interval H&P Note (Signed)
History and Physical Interval Note:  01/04/2014 1:53 PM  [name]  has presented today for surgery, with the diagnosis of cp  The various methods of treatment have been discussed with the patient and family. After consideration of risks, benefits and other options for treatment, the patient has consented to  Procedure(s): LEFT HEART CATHETERIZATION WITH CORONARY ANGIOGRAM (N/A) as a surgical intervention .  The patient's history has been reviewed, patient examined, no change in status, stable for surgery.  I have reviewed the patient's chart and labs.  Questions were answered to the patient's satisfaction.    Cath Lab Visit (complete for each Cath Lab visit)  Clinical Evaluation Leading to the Procedure:   ACS: yes  Non-ACS:    Anginal Classification: CCS IV  Anti-ischemic medical therapy: Minimal Therapy (1 class of medications)  Non-Invasive Test Results: No non-invasive testing performed  Prior CABG: Previous CABG       Sherren Mocha

## 2014-01-04 NOTE — CV Procedure (Signed)
  Cardiac Catheterization Procedure Note  Name: [name] MRN: [id number] DOB: 02/19/1933  Procedure: Left Heart Cath, Selective Coronary Angiography, SVG angiography, PTCA and stenting of the left circumflex  Indication: NSTEMI. This is a 80 year-old woman with known CAD who has undergone previous single vessel CABG with a SVG-distal RCA graft in 2007.  She also underwent PCI of the native left circumflex in 2012 with a Promus DES. On this admission, she presented with ACS and ruled in for NSTEMI. She was incidentally found to be in atrial fibrillation and will require long-term anticoagulation because of a high CHADS-Vasc score.   Procedural Details:  The left wrist was prepped, draped, and anesthetized with 1% lidocaine. Using the modified Seldinger technique, a 5/6 French sheath was introduced into the left radial artery. 3 mg of verapamil was administered through the sheath, weight-based unfractionated heparin was administered intravenously. Standard Judkins catheters were used for selective coronary angiography. An MPA catheter was used to image the SVG-RCA. The native LCA was imaged with an AL-1 catheter. The native RCA is known to be occluded and was not selectively engaged.  Catheter exchanges were performed over an exchange length guidewire.  PROCEDURAL FINDINGS Hemodynamics: AO 145/65 LV 147/22   Coronary angiography: Coronary dominance: right  Left mainstem: The left main is patent. There is 40% distal Left Main stenosis. The left main trifurcates into the LAD, LCx, and a tiny intermediate branch  Left anterior descending (LAD): The proximal LAD is normal in caliber and has no significant stenosis. The first diagonal is medium in caliber and has no significant stenosis. The mid-LAD has 50% stenosis which does not appear high-grade. The distal vessel is patent and reaches the LV apex.   Left circumflex (LCx): The left circumflex has 50% proximal stenosis with associated  calcification. The first OM is widely patent. The mid-circumflex beyond the OM origin has eccentric, hypodense, 90% stenosis. This involves the proximal stent edge and extends into the proximal portion of the previously implanted stent.   Right coronary artery (RCA): Known total occlusion.   SVG-PDA: widely patent. Antegrade fills the PDA and retrograde fills the PLA and native RCA.   Left ventriculography: Deferred (normal LV function by echo)  PCI Note:  Following the diagnostic procedure, the decision was made to proceed with PCI. The patient was loaded with plavix 600 mg on the table.  Weight-based bivalirudin was given for anticoagulation. Once a therapeutic ACT was achieved, a 6 French XB-LAD [place] guide catheter was inserted.  A Cougar coronary guidewire was used to cross the lesion.  I initially attempted to pass an Angiosculpt balloon but it would not cross the proximal circumflex. I was able to pass a 2.5x12 mm balloon and the lesion was predilated to 10 atm. Multiple prolonged inflations were done in an attempt to treat with POBA alone. However, there was significant severe residual stenosis at the lesion site.   The lesion was then stented with a 2.75x12 mm Promus Premier drug-eluting stent.  The stent was postdilated with a 3.0 mm noncompliant balloon to 16 atm.  Following PCI, there was 0% residual stenosis and TIMI-3 flow. Final angiography confirmed an excellent result. The patient tolerated the procedure well. There were no immediate procedural complications. A TR band was used for radial hemostasis. The patient was transferred to the post catheterization recovery area for further monitoring.  PCI Data: Vessel - LCx/Segment - mid Percent Stenosis (pre)  90 (in-stent) TIMI-flow 3 Stent 2.75x12 mm Promus DES Percent   Stenosis (post) 0 TIMI-flow (post) 3  Final Conclusions:   1. Severe native vessel CAD with known total occlusion of the RCA, moderate mid-LAD stenosis, and severe LCx  in-stent restenosis 2. S/p CABG with continued patency of the SVG-right PDA 3. Successful PCI of the LCx with a DES 4. Known normal LV function   Recommendations:  ASA 81 mg, plavix 75 mg, and warfarin for 3 months. Would stop ASA at 3 months. I tried to limit 'triple anticoagulant therapy' by doing balloon angioplasty alone, but the result was suboptimal and necessitated placement of a DES.  Sherren Mocha 01/04/2014, 3:41 PM

## 2014-01-04 NOTE — Progress Notes (Signed)
Patient Name: [name] Date of Encounter: 01/04/2014     Active Problems:   HTN (hypertension)   Chest pain   Coronary artery disease   AF (atrial fibrillation)   Acute diastolic CHF (congestive heart failure)   Nodular radiologic density   Hyperglycemia   Unstable angina   Pulmonary nodule   NSTEMI (non-ST elevated myocardial infarction)    SUBJECTIVE The patient has had no further chest pain.  She is awaiting cardiac catheterization this morning.  Rhythm remains atrial fibrillation.  Atrial fibrillation is a new rhythm for her.  She has not previously been on long-term anticoagulation.  CURRENT MEDS . aspirin EC  81 mg Oral Daily  . ferrous sulfate  325 mg Oral BID WC  . FLUoxetine  20 mg Oral 3 times weekly  . losartan  50 mg Oral BID  . metoprolol succinate  25 mg Oral Daily  . nitroGLYCERIN  0.5 inch Topical Custom  . omega-3 acid ethyl esters  1 g Oral Daily  . sodium chloride  3 mL Intravenous Q12H  . sodium chloride  3 mL Intravenous Q12H    OBJECTIVE  Filed Vitals:   01/03/14 0455 01/03/14 1330 01/03/14 2100 01/04/14 0500  BP: 153/63 101/54 138/63 152/89  Pulse: 67 80 64 67  Temp: 98 F (36.7 C) 98 F (36.7 C) 97.8 F (36.6 C) 98 F (36.7 C)  TempSrc: Oral  Oral Oral  Resp: 18 16 18 18   Height:      Weight: 227 lb 1 oz (102.994 kg)   228 lb (103.42 kg)  SpO2: 94% 98% 98% 97%    Intake/Output Summary (Last 24 hours) at 01/04/14 0913 Last data filed at 01/04/14 0659  Gross per 24 hour  Intake 1063.67 ml  Output    600 ml  Net 463.67 ml   Filed Weights   01/02/14 1339 01/03/14 0455 01/04/14 0500  Weight: 227 lb 1.6 oz (103.012 kg) 227 lb 1 oz (102.994 kg) 228 lb (103.42 kg)    PHYSICAL EXAM  General: Pleasant, NAD. Neuro: Alert and oriented X 3. Moves all extremities spontaneously. Psych: Normal affect. HEENT:  Normal  Neck: Supple without bruits or JVD. Lungs:  Resp regular and unlabored, CTA. Heart: Pulse is irregularly irregular  in atrial fibrillation Abdomen: Soft, non-tender, non-distended, BS + x 4.  Extremities: No clubbing, cyanosis or edema. DP/PT/Radials 2+ and equal bilaterally.  Accessory Clinical Findings  CBC  Recent Labs  01/02/14 0620 01/03/14 0215 01/04/14 0425  WBC 11.2* 10.5 9.1  NEUTROABS 9.4*  --   --   HGB 11.6* 11.3* 10.8*  HCT 36.4 34.8* 33.4*  MCV 88.1 86.4 86.5  PLT 277 268 829   Basic Metabolic Panel  Recent Labs  01/03/14 0215 01/04/14 0425  NA 134* 139  K 4.4 4.3  CL 98 103  CO2 23 23  GLUCOSE 124* 135*  BUN 18 20  CREATININE 0.67 0.68  CALCIUM 9.0 8.2*   Liver Function Tests  Recent Labs  01/02/14 0620 01/03/14 0215  AST 44* 33  ALT 58* 52*  ALKPHOS 88 81  BILITOT 0.5 0.6  PROT 6.9 6.4  ALBUMIN 3.6 3.5    Recent Labs  01/02/14 1500  LIPASE 30   Cardiac Enzymes  Recent Labs  01/02/14 1500 01/02/14 2020 01/03/14 0215  TROPONINI 1.05* 1.09* 0.76*   BNP No components found with this basename: POCBNP,  D-Dimer No results found for this basename: DDIMER,  in the last 72 hours  Hemoglobin A1C  Recent Labs  01/02/14 1500  HGBA1C 7.1*   Fasting Lipid Panel  Recent Labs  01/03/14 0215  CHOL 178  HDL 34*  LDLCALC 116*  TRIG 141  CHOLHDL 5.2   Thyroid Function Tests  Recent Labs  01/02/14 1500  TSH 2.351    TELE  Atrial fibrillation with controlled ventricular response  ECG  2-D echo: - Left ventricle: The cavity size was normal. Wall thickness was increased in a pattern of mild LVH. Systolic function was vigorous. The estimated ejection fraction was in the range of 70% to 75%. Wall motion was normal; there were no regional wall motion abnormalities. The study is not technically sufficient to allow evaluation of LV diastolic function. - Aortic valve: Mildly calcified annulus. Trileaflet; moderately calcified leaflets. Mean gradient: 48mm Hg (S). Valve area: 2.25cm^2(VTI). Peak velocity ratio of LVOT to aortic valve:  0.74. - Mitral valve: Calcified annulus. Mildly thickened leaflets . Mild regurgitation. - Left atrium: The atrium was mildly dilated. - Right atrium: Central venous pressure: 75mm Hg (est). - Tricuspid valve: Mild regurgitation. - Pulmonary arteries: PA peak pressure: 62mm Hg (S). - Pericardium, extracardiac: There was no pericardial effusion. Impressions:  - Mild LVH with LVEF 70-75%, indeterminate diastolic function. Mild left atrial enlargement. MAC with mild mitral regurgitation. Sclerotic aortic valve - increased gradient likely due to increased flow. MIld tricuspid regurgitation with PASP 13 mmHg.   Radiology/Studies  Dg Chest 2 View  01/02/2014   CLINICAL DATA:  chest pain,  EXAM: CHEST  2 VIEW  COMPARISON:  DG EPIDUROGRAM S+I dated 07/30/2012; DG CHEST 2 VIEW dated 06/26/2011  FINDINGS: Sternotomy wires overlie stable enlarged cardiac silhouette. There is interval increase in central venous pulmonary congestion. There are small bilateral pleural effusions which are also noted. There is a nodular density measuring 9 mm along the right horizontal fissure. No focal consolidation. No pneumothorax. Degenerative osteophytosis of the thoracic spine.  IMPRESSION: 1. Interval increase in central venous pulmonary congestion and small bilateral pleural effusions. 2. New nodular density in the right midlung. Recommend CT thorax to evaluate for pulmonary nodule.   Electronically Signed   By: Suzy Bouchard M.D.   On: 01/02/2014 07:10   Ct Chest W Contrast  01/02/2014   CLINICAL DATA:  Chest pain.  Right lung nodule on chest radiograph.  EXAM: CT CHEST WITH CONTRAST  TECHNIQUE: Multidetector CT imaging of the chest was performed during intravenous contrast administration.  CONTRAST:  85mL OMNIPAQUE IOHEXOL 300 MG/ML  SOLN  COMPARISON:  Chest radiograph on 01/02/2014  FINDINGS: Tiny right pleural effusion versus pleural thickening noted. No evidence of left-sided pleural effusion or pericardial  effusion.  No evidence of pulmonary infiltrate or central endobronchial lesion. No suspicious pulmonary nodules or masses are identified.  Eight moderate size hiatal hernia is seen. No evidence of hilar or mediastinal lymphadenopathy. No adenopathy seen elsewhere within the thorax. Both adrenal glands are normal in appearance.  IMPRESSION: Tiny right pleural effusion versus pleural thickening.  No evidence of pulmonary neoplasm.  Moderate hiatal hernia.   Electronically Signed   By: Earle Gell M.D.   On: 01/02/2014 19:21    ASSESSMENT AND PLAN 1. NSTEMI, peak troponin I 1.09, ECG with nonspecific ST changes.  2. Multivessel CAD status post single vessel CABG in 2007 following failed RCA angioplasty, subsequent DES to the circumflex in 2012.  3. No evidence of lung nodule by chest CT following abnormal chest x-ray.  4. Atrial fibrillation, newly documented and of uncertain  duration. CHADSVASC 5.  Plan: Cardiac catheterization today.It is important to note that she has concurrently documented atrial fibrillation and needs to be considered for oral anticoagulation. This will likely have some bearing on percutaneous treatment options and antiplatelet regimen..   Signed, Darlin Coco MD

## 2014-01-05 DIAGNOSIS — I2510 Atherosclerotic heart disease of native coronary artery without angina pectoris: Secondary | ICD-10-CM | POA: Diagnosis not present

## 2014-01-05 DIAGNOSIS — T82897A Other specified complication of cardiac prosthetic devices, implants and grafts, initial encounter: Secondary | ICD-10-CM | POA: Diagnosis not present

## 2014-01-05 DIAGNOSIS — I2582 Chronic total occlusion of coronary artery: Secondary | ICD-10-CM | POA: Diagnosis not present

## 2014-01-05 DIAGNOSIS — I5031 Acute diastolic (congestive) heart failure: Secondary | ICD-10-CM | POA: Diagnosis not present

## 2014-01-05 DIAGNOSIS — I10 Essential (primary) hypertension: Secondary | ICD-10-CM | POA: Diagnosis not present

## 2014-01-05 DIAGNOSIS — I4891 Unspecified atrial fibrillation: Secondary | ICD-10-CM | POA: Diagnosis not present

## 2014-01-05 DIAGNOSIS — I214 Non-ST elevation (NSTEMI) myocardial infarction: Secondary | ICD-10-CM | POA: Diagnosis not present

## 2014-01-05 LAB — CBC
HEMATOCRIT: 35.1 % — AB (ref 36.0–46.0)
Hemoglobin: 11.2 g/dL — ABNORMAL LOW (ref 12.0–15.0)
MCH: 28.1 pg (ref 26.0–34.0)
MCHC: 31.9 g/dL (ref 30.0–36.0)
MCV: 88 fL (ref 78.0–100.0)
Platelets: 257 10*3/uL (ref 150–400)
RBC: 3.99 MIL/uL (ref 3.87–5.11)
RDW: 14.2 % (ref 11.5–15.5)
WBC: 9.1 10*3/uL (ref 4.0–10.5)

## 2014-01-05 LAB — BASIC METABOLIC PANEL
BUN: 19 mg/dL (ref 6–23)
CO2: 24 mEq/L (ref 19–32)
CREATININE: 0.65 mg/dL (ref 0.50–1.10)
Calcium: 8.5 mg/dL (ref 8.4–10.5)
Chloride: 100 mEq/L (ref 96–112)
GFR, EST NON AFRICAN AMERICAN: 83 mL/min — AB (ref >90)
Glucose, Bld: 148 mg/dL — ABNORMAL HIGH (ref 70–99)
Potassium: 4.8 mEq/L (ref 3.7–5.3)
Sodium: 137 mEq/L (ref 137–147)

## 2014-01-05 LAB — PROTIME-INR
INR: 1.03 (ref 0.00–1.49)
Prothrombin Time: 13.3 seconds (ref 11.6–15.2)

## 2014-01-05 MED ORDER — CLOPIDOGREL BISULFATE 75 MG PO TABS: 75.0000 mg | ORAL_TABLET | Freq: Every day | ORAL | Status: DC

## 2014-01-05 MED ORDER — OFF THE BEAT BOOK
Freq: Once | Status: AC
  Administered 2014-01-05: 10:00:00
  Filled 2014-01-05: qty 1

## 2014-01-05 MED ORDER — WARFARIN SODIUM 5 MG PO TABS: 5.0000 mg | ORAL_TABLET | Freq: Every day | ORAL | Status: DC

## 2014-01-05 MED ORDER — PATIENT'S GUIDE TO USING COUMADIN BOOK
Freq: Once | Status: AC
  Administered 2014-01-05: 10:00:00
  Filled 2014-01-05: qty 1

## 2014-01-05 MED FILL — Sodium Chloride IV Soln 0.9%: INTRAVENOUS | Qty: 50 | Status: AC

## 2014-01-05 NOTE — Progress Notes (Signed)
CARDIAC REHAB PHASE I   PRE:  Rate/Rhythm: 59 afib  BP:  Supine:   Sitting: 170/62  Standing:    SaO2:   MODE:  Ambulation: 300 ft   POST:  Rate/Rhythm: 115 afib  BP:  Supine:   Sitting: 209/83,   After rest 153/53  Standing:    SaO2:  0815-0910 Pt walked 300 ft on RA with asst x 1 and holding to siderail with steady gait. No CP after walk but some SOB per pt. BP elevated after walk and lower after rest. Educated with pt voicing understanding. Pt would like referral for [place] Phase 2. Will refer. Encouraged low sodium due to elevated BP.    Graylon Good, RN BSN  01/05/2014 9:03 AM

## 2014-01-05 NOTE — Progress Notes (Signed)
TR BAND REMOVAL  LOCATION:    left radial  DEFLATED PER PROTOCOL:    yes  TIME BAND OFF / DRESSING APPLIED:    21:00   SITE UPON ARRIVAL:    Level 0  SITE AFTER BAND REMOVAL:    Level 0  REVERSE ALLEN'S TEST:     positive  CIRCULATION SENSATION AND MOVEMENT:    Within Normal Limits   yes  COMMENTS:    

## 2014-01-05 NOTE — Progress Notes (Signed)
Coumadin per pharmacy  Admit Complaint: chest pain and afib   Anticoagulation - ACS/CP and afib-> NSTEMI. HL 0.19. Was on heparin at 1600 units/hr Unk duration of afib, CHADsVASC = 5. S/p cath, starting coumadin per pharmacy x 3 months Coumadin score = 4.  INR today is 1.03; per team, will be discharged home today.  Goal INR 2-3  Plan: Rec to continue taking coumadin 5mg  po daily at home and have INR recheck tom or Thursday by clinic to reassess dosing Daily PT/INR

## 2014-01-05 NOTE — Discharge Summary (Signed)
Physician Discharge Summary      Patient ID: [name] MRN: [id number] DOB/AGE: July 13, 1933 80 y.o.  Admit date: 01/02/2014 Discharge date: 01/05/2014 Cardiologist:  [name] PCP: Florina Ou, MD  Admission Diagnoses:  NSTEMI  Discharge Diagnoses:  Principal Problem:   NSTEMI (non-ST elevated myocardial infarction) Active Problems:   AF (atrial fibrillation)   HTN (hypertension)   Chest pain   Coronary artery disease   Acute diastolic CHF (congestive heart failure)   Nodular radiologic density   Hyperglycemia   Unstable angina   Pulmonary nodule   Discharged Condition: stable  Hospital Course:  [name] is a 80 y/o F with history of CAD (1V CABG 2007 due to failed angioplasty to RCA, DES to LCx 2012, last cath 06/2012 significant for moderate diag disease treated medically), HTN, HL, carotid disease (RCEA, followed by [facility]) who presented to [facility] 01/02/2014 with chest pain and newly recognized rate-controlled AF. She has felt fatigued for 7-8 months but attributed this to caring for her husband [name] who has had open heart surgery and a fall, and possibly some mild depression. 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 In the interim she continued to walk frequently with chronic unchanged DOE that she attributes to her scoliosis. Last night she ate pizza. Before she went to bed at rest she developed upper chest discomfort described as an ache. 1 SL NTG didn't help, then Prilosec/Maalox gave minimal relief, then 2nd NTG helped ease pain. In all, the pain lasted several hours. She was able to go to sleep then awoke 3am with recurrence of pain. It was a/w SOB but no diaphoresis, nausea or radiation. The pain persisted at low-level for 2 more hours - around 5am she called EMS. She received 4 baby ASA. Pain spontaneously eased and is pain free at present. On arrival she was noted to be in rate controlled AF which is new. She  denies awareness of palpitations, orthopnea, LEE, weight change, recent travel/bedrest, fever, chills, fall, unsteadiness, recent dizziness, history of TIA/CVA, or bleeding history. She has iron-deficiency anemia which is followed by her PCP but Hgb improved on oral iron. Eval thus far shows POC troponin 0.09 (ref range <0.08), next troponin neg, pBNP 3k, glu 154 and LFTs mildly increased (AST/ALT 44/58). CXR with interval pulm congestion and small bilateral effusions with new nodular density in the R midlung. She does describe a waxing/waning nonproductive cough for several months but this has not been particularly bothersome lately.  Patient was admitted and placed on IV heparin. She ultimately ruled in for NSEMI with a peak troponin of 1.09. She underwent left heart catheterization which revealed the culprit lesion to be a 90% stenosis in the midcircumflex. She underwent successful PCI of the circumflex Promus Premier drug-eluting stent after suboptimal results from balloon angioplasty alone(see full Note below).   She also had newly diagnosed atrial fibrillation with rate control. Chadsvasc of 5.  She was ultimately started on Coumadin.  2-D echocardiogram was completed and showed normal ejection fraction and no wall motion abnormalities.  Chest x-ray revealed a new nodular density in the right midlung. Followup CT of the chest showed no evidence of pulmonary neoplasm.  The patient was seen by Dr. Ellyn Hack who felt she was stable for DC home.  Patient will continue on aspirin, Plavix, Coumadin for 3 months and then drop the aspirin.   Consults: None  Significant Diagnostic Studies:  Left heart cath [facility] FINDINGS  Hemodynamics:  AO 145/65  LV 147/22  Coronary angiography:  Coronary dominance: right  Left mainstem: The left main is patent. There is 40% distal Left Main stenosis. The left main trifurcates into the LAD, LCx, and a tiny intermediate branch  Left anterior descending (LAD): The  proximal LAD is normal in caliber and has no significant stenosis. The first diagonal is medium in caliber and has no significant stenosis. The mid-LAD has 50% stenosis which does not appear high-grade. The distal vessel is patent and reaches the LV apex.  Left circumflex (LCx): The left circumflex has 50% proximal stenosis with associated calcification. The first OM is widely patent. The mid-circumflex beyond the OM origin has eccentric, hypodense, 90% stenosis. This involves the proximal stent edge and extends into the proximal portion of the previously implanted stent.  Right coronary artery (RCA): Known total occlusion.  SVG-PDA: widely patent. Antegrade fills the PDA and retrograde fills the PLA and native RCA.  Left ventriculography: Deferred (normal LV function by echo)  PCI Note: Following the diagnostic procedure, the decision was made to proceed with PCI. The patient was loaded with plavix 600 mg on the table. Weight-based bivalirudin was given for anticoagulation. Once a therapeutic ACT was achieved, a 6 [place] XB-LAD [place] guide catheter was inserted. A Cougar coronary guidewire was used to cross the lesion. I initially attempted to pass an Angiosculpt balloon but it would not cross the proximal circumflex. I was able to pass a 2.5x12 mm balloon and the lesion was predilated to 10 atm. Multiple prolonged inflations were done in an attempt to treat with POBA alone. However, there was significant severe residual stenosis at the lesion site. The lesion was then stented with a 2.75x12 mm Promus Premier drug-eluting stent. The stent was postdilated with a 3.0 mm noncompliant balloon to 16 atm. Following PCI, there was 0% residual stenosis and TIMI-3 flow. Final angiography confirmed an excellent result. The patient tolerated the procedure well. There were no immediate procedural complications. A TR band was used for radial hemostasis. The patient was transferred to the post catheterization recovery area  for further monitoring.  PCI Data:  Vessel - LCx/Segment - mid  Percent Stenosis (pre) 90 (in-stent)  TIMI-flow 3  Stent 2.75x12 mm Promus DES  Percent Stenosis (post) 0  TIMI-flow (post) 3  Final Conclusions:  1. Severe native vessel CAD with known total occlusion of the RCA, moderate mid-LAD stenosis, and severe LCx in-stent restenosis  2. S/p CABG with continued patency of the SVG-right PDA  3. Successful PCI of the LCx with a DES  4. Known normal LV function  Recommendations:  ASA 81 mg, plavix 75 mg, and warfarin for 3 months. Would stop ASA at 3 months. I tried to limit 'triple anticoagulant therapy' by doing balloon angioplasty alone, but the result was suboptimal and necessitated placement of a DES.  Sherren Mocha  01/04/2014, 3:41 PM  2-D echocardiogram Study Conclusions  - Left ventricle: The cavity size was normal. Wall thickness   was increased in a pattern of mild LVH. Systolic function   was vigorous. The estimated ejection fraction was in the   range of 70% to 75%. Wall motion was normal; there were no   regional wall motion abnormalities. The study is not   technically sufficient to allow evaluation of LV diastolic   function. - Aortic valve: Mildly calcified annulus. Trileaflet;   moderately calcified leaflets. Mean gradient: 18m Hg (S).   Valve area: 2.25cm^2(VTI). Peak velocity ratio of LVOT  to   aortic valve: 0.74. - Mitral valve: Calcified annulus. Mildly thickened leaflets   . Mild regurgitation. - Left atrium: The atrium was mildly dilated. - Right atrium: Central venous pressure: 69m Hg (est). - Tricuspid valve: Mild regurgitation. - Pulmonary arteries: PA peak pressure: 14mHg (S). - Pericardium, extracardiac: There was no pericardial   effusion. Impressions:  - Mild LVH with LVEF 70-75%, indeterminate diastolic   function. Mild left atrial enlargement. MAC with mild   mitral regurgitation. Sclerotic aortic valve - increased   gradient likely due  to increased flow. MIld tricuspid   regurgitation with PASP 13 mmHg.  Lipid Panel     Component Value Date/Time   CHOL 178 01/03/2014 0215   TRIG 141 01/03/2014 0215   HDL 34* 01/03/2014 0215   CHOLHDL 5.2 01/03/2014 0215   VLDL 28 01/03/2014 0215   LDLCALC 116* 01/03/2014 0215   EXAM: CT CHEST WITH CONTRAST  TECHNIQUE: Multidetector CT imaging of the chest was performed during intravenous contrast administration.  CONTRAST: 8056mMNIPAQUE IOHEXOL 300 MG/ML SOLN  COMPARISON: Chest radiograph on 01/02/2014  FINDINGS: Tiny right pleural effusion versus pleural thickening noted. No evidence of left-sided pleural effusion or pericardial effusion.  No evidence of pulmonary infiltrate or central endobronchial lesion. No suspicious pulmonary nodules or masses are identified.  Eight moderate size hiatal hernia is seen. No evidence of hilar or mediastinal lymphadenopathy. No adenopathy seen elsewhere within the thorax. Both adrenal glands are normal in appearance.  IMPRESSION: Tiny right pleural effusion versus pleural thickening.  No evidence of pulmonary neoplasm.  Moderate hiatal hernia.    Treatments: See above  Discharge Exam: Blood pressure 170/62, pulse 64, temperature 97.8 F (36.6 C), temperature source Oral, resp. rate 19, height _0  (1.727 m), weight 228 lb 9.9 oz (103.7 kg), SpO2 96.00%.   Disposition: 01-Home or Self Care      Discharge Orders   Future Appointments Provider Department Dept Phone   01/07/2014 10:35 AM [facility] [id number]/20/2015 9:30 AM LorBurtis JunesP [facility] [id number]/06/2014 2:00 PM [facility] [id number]/06/2014 3:15 PM JamMal MistyD [phone] Orders Complete By Expires   Diet - low sodium heart healthy  As directed    Discharge instructions  As  directed    Comments:     No lifting more than a half gallon of milk with your left hand.   Increase activity slowly  As directed        Medication List         ALPRAZolam 0.5 MG tablet  Commonly known as:  XANAX  Take 0.5 mg by mouth at bedtime as needed. For sleep     aspirin EC 81 MG tablet  Take 81 mg by mouth daily.     clopidogrel 75 MG tablet  Commonly known as:  PLAVIX  Take 1 tablet (75 mg total) by mouth daily with breakfast.     ferrous sulfate 325 (65 FE) MG tablet  Take 325 mg by mouth 2 (two) times daily with a meal.     fish oil-omega-3 fatty acids 1000 MG capsule  Take 1 g by mouth daily.     FLUoxetine 20 MG tablet  Commonly known as:  PROZAC  Take 20 mg by mouth 3 (three) times a week.  losartan 50 MG tablet  Commonly known as:  COZAAR  Take 1 tablet (50 mg total) by mouth 2 (two) times daily.     metoprolol succinate 25 MG 24 hr tablet  Commonly known as:  TOPROL-XL  Take 1 tablet (25 mg total) by mouth daily.     nitroGLYCERIN 0.4 MG SL tablet  Commonly known as:  NITROSTAT  Place 1 tablet (0.4 mg total) under the tongue every 5 (five) minutes as needed. For chest pain     warfarin 5 MG tablet  Commonly known as:  COUMADIN  Take 1 tablet (5 mg total) by mouth daily.       Follow-up Information   Follow up with Truitt Merle, NP On 01/19/2014. (9:30AM)    Specialty:  Nurse Practitioner   Contact information:   [place] [place] [address] [phone]       Follow up with Elberta Leatherwood, Pharmd, RPH On 01/07/2014. (Coumadin clinic.  10:35AM)    Specialty:  Pharmacist   Contact information:   [address] [place] [address] [phone]       Signed: Tarri Fuller 01/05/2014, 8:29 AM  I have seen and evaluated the patient this AM along with Tarri Fuller, PA. I agree with his findings, examination & summary. Feels better today. BP somewhat labile -- yet to have home BP meds.  Rate controlled. No  "heartburn" / angina Sx. Has noted feeling a bit "tired" of late -- likely CAD related, but not sure if ? Afib related. Can address as OP.  Plan is d/c to home today s/p PCI to LCx. -- Per Dr. Burt Knack: ASA 81 mg, plavix 75 mg, and warfarin for 3 months. Would stop ASA at 3 months. I tried to limit 'triple anticoagulant therapy' by doing balloon angioplasty alone, but the result was suboptimal and necessitated placement of a DES.  Needs to ambulate after AM BP meds -- then  OK for D/C  Leonie Man, M.D., M.S. [facility] [address] [place], [place]  [address]  [phone] Pager # [phone] 01/05/2014 6:13 PM

## 2014-01-05 NOTE — Progress Notes (Signed)
Subjective: No complaints  Objective: Vital signs in last 24 hours: Temp:  [98 F (36.7 C)-98.4 F (36.9 C)] 98.4 F (36.9 C) (01/06 0454) Pulse Rate:  [55-84] 84 (01/06 0104) Resp:  [18] 18 (01/06 0104) BP: (95-180)/(32-97) 175/97 mmHg (01/06 0454) SpO2:  [93 %-99 %] 99 % (01/06 0104) Weight:  [228 lb 9.9 oz (103.7 kg)] 228 lb 9.9 oz (103.7 kg) (01/06 0104) Last BM Date: 01/03/14  Intake/Output from previous day: 01/05 0701 - 01/06 0700 In: -  Out: 400 [Urine:400] Intake/Output this shift: Total I/O In: -  Out: 400 [Urine:400]  Medications Current Facility-Administered Medications  Medication Dose Route Frequency Provider Last Rate Last Dose  . 0.9 %  sodium chloride infusion  250 mL Intravenous PRN Sherren Mocha, MD      . acetaminophen (TYLENOL) tablet 650 mg  650 mg Oral Q4H PRN Sherren Mocha, MD      . ALPRAZolam Duanne Moron) tablet 0.5 mg  0.5 mg Oral QHS PRN Charlie Pitter, PA-C   0.5 mg at 01/03/14 2143  . aspirin EC tablet 81 mg  81 mg Oral Daily Dayna N Dunn, PA-C   81 mg at 01/04/14 0934  . clopidogrel (PLAVIX) tablet 75 mg  75 mg Oral Q breakfast Sherren Mocha, MD      . ferrous sulfate tablet 325 mg  325 mg Oral BID WC Dayna N Dunn, PA-C   325 mg at 01/04/14 0733  . FLUoxetine (PROZAC) tablet 20 mg  20 mg Oral 3 times weekly Dayna N Dunn, PA-C   20 mg at 01/04/14 3235  . losartan (COZAAR) tablet 50 mg  50 mg Oral BID Dayna N Dunn, PA-C   50 mg at 01/04/14 2246  . metoprolol succinate (TOPROL-XL) 24 hr tablet 25 mg  25 mg Oral Daily Dayna N Dunn, PA-C   25 mg at 01/04/14 2336  . nitroGLYCERIN (NITROSTAT) SL tablet 0.4 mg  0.4 mg Sublingual Q5 min PRN Dayna N Dunn, PA-C      . omega-3 acid ethyl esters (LOVAZA) capsule 1 g  1 g Oral Daily Satira Sark, MD   1 g at 01/04/14 5732  . ondansetron (ZOFRAN) injection 4 mg  4 mg Intravenous Q6H PRN Dayna N Dunn, PA-C      . sodium chloride 0.9 % injection 3 mL  3 mL Intravenous Q12H Sherren Mocha, MD      . sodium  chloride 0.9 % injection 3 mL  3 mL Intravenous PRN Sherren Mocha, MD      . Warfarin - Pharmacist Dosing Inpatient   Does not apply q1800 [name] [name], MD        PE: General appearance: alert, cooperative and no distress Lungs: clear to auscultation bilaterally Heart: irregularly irregular rhythm Extremities: Trace ankle edema Pulses: 2+ and symmetric 1+ DPs Skin: Mild eccymosis in the left wrist cath site. Neurologic: Grossly normal  Lab Results:   Recent Labs  01/03/14 0215 01/04/14 0425 01/05/14 0325  WBC 10.5 9.1 9.1  HGB 11.3* 10.8* 11.2*  HCT 34.8* 33.4* 35.1*  PLT 268 245 257   BMET  Recent Labs  01/03/14 0215 01/04/14 0425 01/05/14 0325  NA 134* 139 137  K 4.4 4.3 4.8  CL 98 103 100  CO2 23 23 24   GLUCOSE 124* 135* 148*  BUN 18 20 19   CREATININE 0.67 0.68 0.65  CALCIUM 9.0 8.2* 8.5   PT/INR  Recent Labs  01/02/14 1500 01/05/14 0325  LABPROT 13.7  13.3  INR 1.07 1.03   Cholesterol  Recent Labs  01/03/14 0215  CHOL 178   Lipid Panel     Component Value Date/Time   CHOL 178 01/03/2014 0215   TRIG 141 01/03/2014 0215   HDL 34* 01/03/2014 0215   CHOLHDL 5.2 01/03/2014 0215   VLDL 28 01/03/2014 0215   LDLCALC 116* 01/03/2014 0215    Assessment/Plan    Active Problems:   HTN (hypertension)   Chest pain   Coronary artery disease   AF (atrial fibrillation)   Acute diastolic CHF (congestive heart failure)   Nodular radiologic density   Hyperglycemia   Unstable angina   Pulmonary nodule   NSTEMI (non-ST elevated myocardial infarction)  Plan: SP Left Heart Cath via left wrist, Selective Coronary Angiography, SVG angiography, successful PTCA and stenting of the left circumflex.  BP labile. Currently 175/97.   Recheck after morning meds.   ASA, Plavix, losartan 50, Toprol XL 25, Lovaza, coumadin.  Afib well controlled.  DC home today.      LOS: 3 days    HAGER, BRYAN 01/05/2014 6:19 AM  I have seen and evaluated the patient this AM along  with Tarri Fuller, PA. I agree with his findings, examination as well as impression recommendations.  Feels better today.  BP somewhat labile -- yet to have home BP meds.  Rate controlled. No "heartburn" / angina Sx.  Has noted feeling a bit "tired" of late -- likely CAD related, but not sure if ? Afib related.  Can address as OP.  Plan is d/c to home today s/p PCI to LCx. --  Per Dr. Burt Knack: ASA 81 mg, plavix 75 mg, and warfarin for 3 months. Would stop ASA at 3 months. I tried to limit 'triple anticoagulant therapy' by doing balloon angioplasty alone, but the result was suboptimal and necessitated placement of a DES.  Needs to ambulate after AM BP meds -- if stable, can d/c.   Leonie Man, M.D., M.S. [facility] [address] [place],   [address]  [phone] Pager # [phone] 01/05/2014 7:39 AM

## 2014-01-08 ENCOUNTER — Ambulatory Visit (INDEPENDENT_AMBULATORY_CARE_PROVIDER_SITE_OTHER): Payer: Medicare Other | Admitting: *Deleted

## 2014-01-08 DIAGNOSIS — I4891 Unspecified atrial fibrillation: Secondary | ICD-10-CM

## 2014-01-08 DIAGNOSIS — Z7901 Long term (current) use of anticoagulants: Secondary | ICD-10-CM | POA: Insufficient documentation

## 2014-01-08 DIAGNOSIS — I214 Non-ST elevation (NSTEMI) myocardial infarction: Secondary | ICD-10-CM

## 2014-01-08 LAB — POCT INR: INR: 1.1

## 2014-01-08 NOTE — Patient Instructions (Signed)

## 2014-01-11 ENCOUNTER — Telehealth: Payer: Self-pay | Admitting: Cardiology

## 2014-01-11 NOTE — Telephone Encounter (Signed)
New message    Talk to Malachy Mood sometimes today about a message she sent you in epic last week

## 2014-01-11 NOTE — Telephone Encounter (Signed)
Returned call to Verdis Frederickson at [place] spoke to [place] Dr.Jordan in office today will have him sign outpatient cardiac rehab form and I will fax back.

## 2014-01-13 ENCOUNTER — Ambulatory Visit (INDEPENDENT_AMBULATORY_CARE_PROVIDER_SITE_OTHER): Payer: Medicare Other | Admitting: *Deleted

## 2014-01-13 DIAGNOSIS — Z7901 Long term (current) use of anticoagulants: Secondary | ICD-10-CM

## 2014-01-13 DIAGNOSIS — I4891 Unspecified atrial fibrillation: Secondary | ICD-10-CM | POA: Diagnosis not present

## 2014-01-13 DIAGNOSIS — I214 Non-ST elevation (NSTEMI) myocardial infarction: Secondary | ICD-10-CM

## 2014-01-13 LAB — POCT INR: INR: 1.6

## 2014-01-19 ENCOUNTER — Encounter: Payer: Self-pay | Admitting: Nurse Practitioner

## 2014-01-19 ENCOUNTER — Ambulatory Visit (INDEPENDENT_AMBULATORY_CARE_PROVIDER_SITE_OTHER): Payer: Medicare Other | Admitting: Pharmacist

## 2014-01-19 ENCOUNTER — Ambulatory Visit (INDEPENDENT_AMBULATORY_CARE_PROVIDER_SITE_OTHER): Payer: Medicare Other | Admitting: Nurse Practitioner

## 2014-01-19 VITALS — BP 140/80 | HR 60 | Ht 68.0 in | Wt 226.0 lb

## 2014-01-19 DIAGNOSIS — I214 Non-ST elevation (NSTEMI) myocardial infarction: Secondary | ICD-10-CM

## 2014-01-19 DIAGNOSIS — I4891 Unspecified atrial fibrillation: Secondary | ICD-10-CM

## 2014-01-19 DIAGNOSIS — Z7901 Long term (current) use of anticoagulants: Secondary | ICD-10-CM | POA: Diagnosis not present

## 2014-01-19 DIAGNOSIS — Z79899 Other long term (current) drug therapy: Secondary | ICD-10-CM

## 2014-01-19 LAB — POCT INR: INR: 2.4

## 2014-01-19 LAB — BASIC METABOLIC PANEL
BUN: 19 mg/dL (ref 6–23)
CO2: 26 mEq/L (ref 19–32)
Calcium: 9 mg/dL (ref 8.4–10.5)
Chloride: 103 mEq/L (ref 96–112)
Creatinine, Ser: 0.8 mg/dL (ref 0.4–1.2)
GFR: 71.57 mL/min (ref >60.00)
Glucose, Bld: 108 mg/dL — ABNORMAL HIGH (ref 70–99)
Potassium: 4.3 mEq/L (ref 3.5–5.1)
Sodium: 136 mEq/L (ref 135–145)

## 2014-01-19 LAB — CBC
HCT: 36.2 % (ref 36.0–46.0)
Hemoglobin: 11.7 g/dL — ABNORMAL LOW (ref 12.0–15.0)
MCHC: 32.4 g/dL (ref 30.0–36.0)
MCV: 84.7 fl (ref 78.0–100.0)
Platelets: 328 10*3/uL (ref 150.0–400.0)
RBC: 4.28 Mil/uL (ref 3.87–5.11)
RDW: 15 % — ABNORMAL HIGH (ref 11.5–14.6)
WBC: 8.4 10*3/uL (ref 4.5–10.5)
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
## 2014-01-19 NOTE — Patient Instructions (Signed)
Ok to go to cardiac rehab  May resume Pilates in 2 weeks - start out slow  Stay on your current medicines  We will check labs today  See me and Dr. [name] in a month  Call the [place] office at [phone] if you have any questions, problems or concerns.

## 2014-01-21 ENCOUNTER — Inpatient Hospital Stay (HOSPITAL_COMMUNITY): Admission: RE | Admit: 2014-01-21 | Payer: Medicare Other | Source: Ambulatory Visit

## 2014-01-25 ENCOUNTER — Encounter (HOSPITAL_COMMUNITY): Payer: Medicare Other

## 2014-01-25 DIAGNOSIS — M9981 Other biomechanical lesions of cervical region: Secondary | ICD-10-CM | POA: Diagnosis not present

## 2014-01-25 DIAGNOSIS — M5137 Other intervertebral disc degeneration, lumbosacral region: Secondary | ICD-10-CM | POA: Diagnosis not present

## 2014-01-25 DIAGNOSIS — M999 Biomechanical lesion, unspecified: Secondary | ICD-10-CM | POA: Diagnosis not present

## 2014-01-25 DIAGNOSIS — M5030 Other cervical disc degeneration, unspecified cervical region: Secondary | ICD-10-CM | POA: Diagnosis not present

## 2014-01-27 ENCOUNTER — Encounter (HOSPITAL_COMMUNITY): Payer: Medicare Other

## 2014-01-28 ENCOUNTER — Ambulatory Visit (INDEPENDENT_AMBULATORY_CARE_PROVIDER_SITE_OTHER): Payer: Medicare Other

## 2014-01-28 ENCOUNTER — Encounter (HOSPITAL_COMMUNITY)
Admission: RE | Admit: 2014-01-28 | Discharge: 2014-01-28 | Disposition: A | Discharge status: Home / Self Care | Payer: Medicare Other | Source: Ambulatory Visit | Attending: Cardiology | Admitting: Cardiology

## 2014-01-28 DIAGNOSIS — I214 Non-ST elevation (NSTEMI) myocardial infarction: Secondary | ICD-10-CM | POA: Diagnosis not present

## 2014-01-28 DIAGNOSIS — Z5181 Encounter for therapeutic drug level monitoring: Secondary | ICD-10-CM

## 2014-01-28 DIAGNOSIS — I4891 Unspecified atrial fibrillation: Secondary | ICD-10-CM | POA: Diagnosis not present

## 2014-01-28 DIAGNOSIS — Z7901 Long term (current) use of anticoagulants: Secondary | ICD-10-CM

## 2014-01-28 LAB — POCT INR: INR: 3.4

## 2014-01-28 NOTE — Progress Notes (Signed)
Cardiac Rehab Medication Review by a Pharmacist  Does the patient  feel that his/her medications are working for him/her?  yes  Has the patient been experiencing any side effects to the medications prescribed?  no  Does the patient measure his/her own blood pressure or blood glucose at home?  no   Does the patient have any problems obtaining medications due to transportation or finances?   no  Understanding of regimen: good Understanding of indications: good Potential of compliance: good  Pharmacist comments: Patient has a good understanding of her medication regimen. She feels all of her medications are working appropriately for her, and she has not had any side effects. She was recently placed on warfarin about a month ago and mentioned her INR is therapeutic. We discussed the S/S of bleeding and DVT/PE during the visit. She also mentioned she takes OTC Prilosec so it was added to her list of medications.  Roderic Palau A. Pincus Badder, PharmD Clinical Pharmacist - Resident Pager: [phone] Pharmacy: [phone] 01/28/2014 9:25 AM

## 2014-01-29 ENCOUNTER — Encounter (HOSPITAL_COMMUNITY): Payer: Medicare Other

## 2014-01-29 ENCOUNTER — Other Ambulatory Visit: Payer: Self-pay | Admitting: Vascular Surgery

## 2014-01-29 DIAGNOSIS — I6529 Occlusion and stenosis of unspecified carotid artery: Secondary | ICD-10-CM

## 2014-02-01 ENCOUNTER — Encounter (HOSPITAL_COMMUNITY)
Admission: RE | Admit: 2014-02-01 | Discharge: 2014-02-01 | Disposition: A | Discharge status: Home / Self Care | Payer: Medicare Other | Source: Ambulatory Visit | Attending: Cardiology | Admitting: Cardiology

## 2014-02-01 DIAGNOSIS — Z87891 Personal history of nicotine dependence: Secondary | ICD-10-CM | POA: Insufficient documentation

## 2014-02-01 DIAGNOSIS — Z7901 Long term (current) use of anticoagulants: Secondary | ICD-10-CM | POA: Diagnosis not present

## 2014-02-01 DIAGNOSIS — I5031 Acute diastolic (congestive) heart failure: Secondary | ICD-10-CM | POA: Insufficient documentation

## 2014-02-01 DIAGNOSIS — I10 Essential (primary) hypertension: Secondary | ICD-10-CM | POA: Diagnosis not present

## 2014-02-01 DIAGNOSIS — Z7902 Long term (current) use of antithrombotics/antiplatelets: Secondary | ICD-10-CM | POA: Insufficient documentation

## 2014-02-01 DIAGNOSIS — R911 Solitary pulmonary nodule: Secondary | ICD-10-CM | POA: Diagnosis not present

## 2014-02-01 DIAGNOSIS — Z882 Allergy status to sulfonamides status: Secondary | ICD-10-CM | POA: Insufficient documentation

## 2014-02-01 DIAGNOSIS — Z888 Allergy status to other drugs, medicaments and biological substances status: Secondary | ICD-10-CM | POA: Insufficient documentation

## 2014-02-01 DIAGNOSIS — I2582 Chronic total occlusion of coronary artery: Secondary | ICD-10-CM | POA: Diagnosis not present

## 2014-02-01 DIAGNOSIS — I252 Old myocardial infarction: Secondary | ICD-10-CM | POA: Insufficient documentation

## 2014-02-01 DIAGNOSIS — Z79899 Other long term (current) drug therapy: Secondary | ICD-10-CM | POA: Insufficient documentation

## 2014-02-01 DIAGNOSIS — I4891 Unspecified atrial fibrillation: Secondary | ICD-10-CM | POA: Diagnosis not present

## 2014-02-01 DIAGNOSIS — Z7982 Long term (current) use of aspirin: Secondary | ICD-10-CM | POA: Diagnosis not present

## 2014-02-01 DIAGNOSIS — I2510 Atherosclerotic heart disease of native coronary artery without angina pectoris: Secondary | ICD-10-CM | POA: Insufficient documentation

## 2014-02-01 DIAGNOSIS — Z5189 Encounter for other specified aftercare: Secondary | ICD-10-CM | POA: Diagnosis not present

## 2014-02-01 DIAGNOSIS — E669 Obesity, unspecified: Secondary | ICD-10-CM | POA: Diagnosis not present

## 2014-02-01 DIAGNOSIS — E785 Hyperlipidemia, unspecified: Secondary | ICD-10-CM | POA: Diagnosis not present

## 2014-02-01 DIAGNOSIS — I509 Heart failure, unspecified: Secondary | ICD-10-CM | POA: Diagnosis not present

## 2014-02-01 LAB — GLUCOSE, CAPILLARY: GLUCOSE-CAPILLARY: 110 mg/dL — AB (ref 70–99)

## 2014-02-01 NOTE — Progress Notes (Signed)
Pt in today for first day of exercise at the 1115 class.  Pt tolerated light exercise with the use of walker for stability and support.  Monitor showed Afib which pt has a history of and is on anti-coagulation therapy.  PHQ2 score 0.  Pt is excited to participate in rehab.  Pt desires to move to assistive living. Continue to monitor.  Cherre Huger, BSN

## 2014-02-03 ENCOUNTER — Encounter (HOSPITAL_COMMUNITY)
Admission: RE | Admit: 2014-02-03 | Discharge: 2014-02-03 | Disposition: A | Discharge status: Home / Self Care | Payer: Medicare Other | Source: Ambulatory Visit | Attending: Cardiology | Admitting: Cardiology

## 2014-02-05 ENCOUNTER — Encounter (HOSPITAL_COMMUNITY)
Admission: RE | Admit: 2014-02-05 | Discharge: 2014-02-05 | Disposition: A | Discharge status: Home / Self Care | Payer: Medicare Other | Source: Ambulatory Visit | Attending: Cardiology | Admitting: Cardiology

## 2014-02-08 ENCOUNTER — Ambulatory Visit (INDEPENDENT_AMBULATORY_CARE_PROVIDER_SITE_OTHER): Payer: Medicare Other | Admitting: *Deleted

## 2014-02-08 ENCOUNTER — Encounter (HOSPITAL_COMMUNITY)
Admission: RE | Admit: 2014-02-08 | Discharge: 2014-02-08 | Disposition: A | Discharge status: Home / Self Care | Payer: Medicare Other | Source: Ambulatory Visit | Attending: Cardiology | Admitting: Cardiology

## 2014-02-08 DIAGNOSIS — I4891 Unspecified atrial fibrillation: Secondary | ICD-10-CM | POA: Diagnosis not present

## 2014-02-08 DIAGNOSIS — Z7901 Long term (current) use of anticoagulants: Secondary | ICD-10-CM

## 2014-02-08 DIAGNOSIS — I214 Non-ST elevation (NSTEMI) myocardial infarction: Secondary | ICD-10-CM | POA: Diagnosis not present

## 2014-02-08 DIAGNOSIS — Z5181 Encounter for therapeutic drug level monitoring: Secondary | ICD-10-CM

## 2014-02-08 LAB — POCT INR: INR: 2.9

## 2014-02-10 ENCOUNTER — Encounter (HOSPITAL_COMMUNITY): Payer: Medicare Other

## 2014-02-12 ENCOUNTER — Encounter (HOSPITAL_COMMUNITY)
Admission: RE | Admit: 2014-02-12 | Discharge: 2014-02-12 | Disposition: A | Discharge status: Home / Self Care | Payer: Medicare Other | Source: Ambulatory Visit | Attending: Cardiology | Admitting: Cardiology

## 2014-02-12 NOTE — Progress Notes (Signed)
Reviewed home exercise with pt today.  Pt plans to walk and go to pilates class for exercise.  Reviewed THR, pulse, RPE, sign and symptoms, NTG use, and when to call 911 or MD.  Pt voiced understanding. Alberteen Sam, MA, ACSM RCEP

## 2014-02-15 ENCOUNTER — Encounter (HOSPITAL_COMMUNITY)
Admission: RE | Admit: 2014-02-15 | Discharge: 2014-02-15 | Disposition: A | Discharge status: Home / Self Care | Payer: Medicare Other | Source: Ambulatory Visit | Attending: Cardiology | Admitting: Cardiology

## 2014-02-15 ENCOUNTER — Other Ambulatory Visit: Payer: Self-pay | Admitting: Nurse Practitioner

## 2014-02-17 ENCOUNTER — Encounter (HOSPITAL_COMMUNITY): Payer: Medicare Other

## 2014-02-19 ENCOUNTER — Encounter (HOSPITAL_COMMUNITY)
Admission: RE | Admit: 2014-02-19 | Discharge: 2014-02-19 | Disposition: A | Discharge status: Home / Self Care | Payer: Medicare Other | Source: Ambulatory Visit | Attending: Cardiology | Admitting: Cardiology

## 2014-02-19 NOTE — Progress Notes (Signed)
[name] 80 y.o. female Nutrition Note Spoke with pt.  Nutrition Plan and goals reviewed with pt. Pt has not returned her Nutrition Survey at this time. Pt states she wants to lose wt. Pt has not been actively trying to lose wt at this time. Pt and pt's husband eat out most days for lunch. Pt frequently eats a regular [employer] sandwich, a ham and provolone sub at [employer], or a quesadillas from [employer]. Healthier choices when eating out discussed. Wt loss tips reviewed. Last A1c suggests pt should be screened further for diabetes. Per discussion, pt was unaware of elevated A1c. Pt plans to discuss A1c/further DM testing with her PA next week. Pt is on Coumadin and is aware of the need to watch Vitamin K intake. Consistent Vitamin K intake reviewed. Pt is statin intolerant. Dietary changes that can help improve cholesterol discussed. Pt expressed understanding of the information reviewed. Pt aware of nutrition education classes offered.  Nutrition Diagnosis   Food-and nutrition-related knowledge deficit related to lack of exposure to information as related to diagnosis of: ? CVD ? A1c 7.1   Obesity related to excessive energy intake as evidenced by a BMI of 33.5  Nutrition RX/ Estimated Daily Nutrition Needs for: wt loss  1400-1900 Kcal, 35-50 gm fat, 9-14 gm sat fat, 1.3-1.9gm trans-fat, <1500 mg sodium  Nutrition Intervention   Pt's individual nutrition plan reviewed with pt.   Benefits of adopting Therapeutic Lifestyle Changes discussed when Medficts reviewed.   Pt to attend the Portion Distortion class   Pt to attend the  ? Nutrition I class                     ? Nutrition II class    Pt given handouts for: ? Consistent vit K diet  ? 5-day, 1500 kcal menu ideas ? Tips for Making Healthier Choices when Eating Out    Continue client-centered nutrition education by RD, as part of interdisciplinary care. Goal(s)   Pt to identify and limit food sources of saturated fat, trans fat,  and cholesterol   Pt to identify food quantities necessary to achieve: ? wt loss to a goal wt of 202-220 lb (92.1-100.3 kg) at graduation from cardiac rehab.    CBG concentrations in the normal range or as close to normal as is safely possible. Monitor and Evaluate progress toward nutrition goal with team. Nutrition Risk: Change to Moderate Derek Mound, M.Ed, RD, LDN, CDE 02/19/2014 1:20 PM

## 2014-02-22 ENCOUNTER — Encounter (HOSPITAL_COMMUNITY): Payer: Medicare Other

## 2014-02-22 ENCOUNTER — Telehealth (HOSPITAL_COMMUNITY): Payer: Self-pay | Admitting: Family Medicine

## 2014-02-22 DIAGNOSIS — E663 Overweight: Secondary | ICD-10-CM | POA: Diagnosis not present

## 2014-02-22 DIAGNOSIS — M545 Low back pain, unspecified: Secondary | ICD-10-CM | POA: Diagnosis not present

## 2014-02-22 DIAGNOSIS — M5137 Other intervertebral disc degeneration, lumbosacral region: Secondary | ICD-10-CM | POA: Diagnosis not present

## 2014-02-22 DIAGNOSIS — I10 Essential (primary) hypertension: Secondary | ICD-10-CM | POA: Diagnosis not present

## 2014-02-23 ENCOUNTER — Ambulatory Visit: Payer: Medicare Other | Admitting: Nurse Practitioner

## 2014-02-24 ENCOUNTER — Encounter (HOSPITAL_COMMUNITY)
Admission: RE | Admit: 2014-02-24 | Discharge: 2014-02-24 | Disposition: A | Discharge status: Home / Self Care | Payer: Medicare Other | Source: Ambulatory Visit | Attending: Cardiology | Admitting: Cardiology

## 2014-02-26 ENCOUNTER — Encounter (HOSPITAL_COMMUNITY): Payer: Medicare Other

## 2014-03-01 ENCOUNTER — Encounter (HOSPITAL_COMMUNITY)
Admission: RE | Admit: 2014-03-01 | Discharge: 2014-03-01 | Disposition: A | Discharge status: Home / Self Care | Payer: Medicare Other | Source: Ambulatory Visit | Attending: Cardiology | Admitting: Cardiology

## 2014-03-01 DIAGNOSIS — I4891 Unspecified atrial fibrillation: Secondary | ICD-10-CM | POA: Diagnosis not present

## 2014-03-01 DIAGNOSIS — I5031 Acute diastolic (congestive) heart failure: Secondary | ICD-10-CM | POA: Diagnosis not present

## 2014-03-01 DIAGNOSIS — E669 Obesity, unspecified: Secondary | ICD-10-CM | POA: Diagnosis not present

## 2014-03-01 DIAGNOSIS — I509 Heart failure, unspecified: Secondary | ICD-10-CM | POA: Insufficient documentation

## 2014-03-01 DIAGNOSIS — Z7901 Long term (current) use of anticoagulants: Secondary | ICD-10-CM | POA: Insufficient documentation

## 2014-03-01 DIAGNOSIS — I252 Old myocardial infarction: Secondary | ICD-10-CM | POA: Diagnosis not present

## 2014-03-01 DIAGNOSIS — Z888 Allergy status to other drugs, medicaments and biological substances status: Secondary | ICD-10-CM | POA: Diagnosis not present

## 2014-03-01 DIAGNOSIS — Z7902 Long term (current) use of antithrombotics/antiplatelets: Secondary | ICD-10-CM | POA: Insufficient documentation

## 2014-03-01 DIAGNOSIS — E785 Hyperlipidemia, unspecified: Secondary | ICD-10-CM | POA: Insufficient documentation

## 2014-03-01 DIAGNOSIS — I2582 Chronic total occlusion of coronary artery: Secondary | ICD-10-CM | POA: Insufficient documentation

## 2014-03-01 DIAGNOSIS — Z5189 Encounter for other specified aftercare: Secondary | ICD-10-CM | POA: Insufficient documentation

## 2014-03-01 DIAGNOSIS — Z79899 Other long term (current) drug therapy: Secondary | ICD-10-CM | POA: Diagnosis not present

## 2014-03-01 DIAGNOSIS — I10 Essential (primary) hypertension: Secondary | ICD-10-CM | POA: Insufficient documentation

## 2014-03-01 DIAGNOSIS — Z882 Allergy status to sulfonamides status: Secondary | ICD-10-CM | POA: Insufficient documentation

## 2014-03-01 DIAGNOSIS — Z87891 Personal history of nicotine dependence: Secondary | ICD-10-CM | POA: Insufficient documentation

## 2014-03-01 DIAGNOSIS — Z7982 Long term (current) use of aspirin: Secondary | ICD-10-CM | POA: Insufficient documentation

## 2014-03-01 DIAGNOSIS — I2510 Atherosclerotic heart disease of native coronary artery without angina pectoris: Secondary | ICD-10-CM | POA: Diagnosis not present

## 2014-03-01 DIAGNOSIS — R911 Solitary pulmonary nodule: Secondary | ICD-10-CM | POA: Insufficient documentation

## 2014-03-03 ENCOUNTER — Encounter (HOSPITAL_COMMUNITY)
Admission: RE | Admit: 2014-03-03 | Discharge: 2014-03-03 | Disposition: A | Discharge status: Home / Self Care | Payer: Medicare Other | Source: Ambulatory Visit | Attending: Cardiology | Admitting: Cardiology

## 2014-03-05 ENCOUNTER — Ambulatory Visit (INDEPENDENT_AMBULATORY_CARE_PROVIDER_SITE_OTHER): Payer: Medicare Other | Admitting: *Deleted

## 2014-03-05 ENCOUNTER — Encounter (HOSPITAL_COMMUNITY)
Admission: RE | Admit: 2014-03-05 | Discharge: 2014-03-05 | Disposition: A | Discharge status: Home / Self Care | Payer: Medicare Other | Source: Ambulatory Visit | Attending: Cardiology | Admitting: Cardiology

## 2014-03-05 DIAGNOSIS — I4891 Unspecified atrial fibrillation: Secondary | ICD-10-CM | POA: Diagnosis not present

## 2014-03-05 DIAGNOSIS — Z5181 Encounter for therapeutic drug level monitoring: Secondary | ICD-10-CM | POA: Diagnosis not present

## 2014-03-05 DIAGNOSIS — I214 Non-ST elevation (NSTEMI) myocardial infarction: Secondary | ICD-10-CM

## 2014-03-05 DIAGNOSIS — Z7901 Long term (current) use of anticoagulants: Secondary | ICD-10-CM | POA: Diagnosis not present

## 2014-03-05 LAB — POCT INR: INR: 4.6

## 2014-03-08 ENCOUNTER — Encounter (HOSPITAL_COMMUNITY)
Admission: RE | Admit: 2014-03-08 | Discharge: 2014-03-08 | Disposition: A | Discharge status: Home / Self Care | Payer: Medicare Other | Source: Ambulatory Visit | Attending: Cardiology | Admitting: Cardiology

## 2014-03-10 ENCOUNTER — Encounter (HOSPITAL_COMMUNITY): Payer: Medicare Other

## 2014-03-10 DIAGNOSIS — M48061 Spinal stenosis, lumbar region without neurogenic claudication: Secondary | ICD-10-CM | POA: Diagnosis not present

## 2014-03-10 DIAGNOSIS — M47817 Spondylosis without myelopathy or radiculopathy, lumbosacral region: Secondary | ICD-10-CM | POA: Diagnosis not present

## 2014-03-12 ENCOUNTER — Encounter (HOSPITAL_COMMUNITY)
Admission: RE | Admit: 2014-03-12 | Discharge: 2014-03-12 | Disposition: A | Discharge status: Home / Self Care | Payer: Medicare Other | Source: Ambulatory Visit | Attending: Cardiology | Admitting: Cardiology

## 2014-03-15 ENCOUNTER — Encounter (HOSPITAL_COMMUNITY)
Admission: RE | Admit: 2014-03-15 | Discharge: 2014-03-15 | Disposition: A | Discharge status: Home / Self Care | Payer: Medicare Other | Source: Ambulatory Visit | Attending: Cardiology | Admitting: Cardiology

## 2014-03-15 ENCOUNTER — Other Ambulatory Visit: Payer: Self-pay | Admitting: Nurse Practitioner

## 2014-03-16 ENCOUNTER — Ambulatory Visit (INDEPENDENT_AMBULATORY_CARE_PROVIDER_SITE_OTHER): Payer: Medicare Other | Admitting: Nurse Practitioner

## 2014-03-16 ENCOUNTER — Encounter: Payer: Self-pay | Admitting: Nurse Practitioner

## 2014-03-16 VITALS — BP 132/80 | HR 76 | Ht 68.0 in | Wt 221.0 lb

## 2014-03-16 DIAGNOSIS — I4891 Unspecified atrial fibrillation: Secondary | ICD-10-CM | POA: Diagnosis not present

## 2014-03-16 DIAGNOSIS — I6529 Occlusion and stenosis of unspecified carotid artery: Secondary | ICD-10-CM | POA: Diagnosis not present

## 2014-03-16 DIAGNOSIS — Z7901 Long term (current) use of anticoagulants: Secondary | ICD-10-CM | POA: Diagnosis not present

## 2014-03-16 DIAGNOSIS — I2510 Atherosclerotic heart disease of native coronary artery without angina pectoris: Secondary | ICD-10-CM

## 2014-03-16 NOTE — Patient Instructions (Signed)
Stay on your current medicines  Keep up the good work  See Dr. [name] around May 5th  Call the [place] office at [phone] if you have any questions, problems or concerns.
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
## 2014-03-17 ENCOUNTER — Encounter (HOSPITAL_COMMUNITY)
Admission: RE | Admit: 2014-03-17 | Discharge: 2014-03-17 | Disposition: A | Discharge status: Home / Self Care | Payer: Medicare Other | Source: Ambulatory Visit | Attending: Cardiology | Admitting: Cardiology

## 2014-03-19 ENCOUNTER — Ambulatory Visit (INDEPENDENT_AMBULATORY_CARE_PROVIDER_SITE_OTHER): Payer: Medicare Other | Admitting: Pharmacist

## 2014-03-19 ENCOUNTER — Encounter (HOSPITAL_COMMUNITY)
Admission: RE | Admit: 2014-03-19 | Discharge: 2014-03-19 | Disposition: A | Discharge status: Home / Self Care | Payer: Medicare Other | Source: Ambulatory Visit | Attending: Cardiology | Admitting: Cardiology

## 2014-03-19 DIAGNOSIS — Z7901 Long term (current) use of anticoagulants: Secondary | ICD-10-CM

## 2014-03-19 DIAGNOSIS — Z5181 Encounter for therapeutic drug level monitoring: Secondary | ICD-10-CM

## 2014-03-19 DIAGNOSIS — I4891 Unspecified atrial fibrillation: Secondary | ICD-10-CM

## 2014-03-19 DIAGNOSIS — I214 Non-ST elevation (NSTEMI) myocardial infarction: Secondary | ICD-10-CM | POA: Diagnosis not present

## 2014-03-19 LAB — POCT INR: INR: 2.7

## 2014-03-19 NOTE — Progress Notes (Signed)
[name] exit blood pressure was elevated post exercise. [name] admitted to running out of her losartan she plans to pick up her refill after exercise. Exit blood pressure 136/82. Will continue to monitor the patient throughout  the program.

## 2014-03-22 ENCOUNTER — Encounter (HOSPITAL_COMMUNITY): Payer: Medicare Other

## 2014-03-24 ENCOUNTER — Encounter (HOSPITAL_COMMUNITY)
Admission: RE | Admit: 2014-03-24 | Discharge: 2014-03-24 | Disposition: A | Discharge status: Home / Self Care | Payer: Medicare Other | Source: Ambulatory Visit | Attending: Cardiology | Admitting: Cardiology

## 2014-03-26 ENCOUNTER — Encounter (HOSPITAL_COMMUNITY)
Admission: RE | Admit: 2014-03-26 | Discharge: 2014-03-26 | Disposition: A | Discharge status: Home / Self Care | Payer: Medicare Other | Source: Ambulatory Visit | Attending: Cardiology | Admitting: Cardiology

## 2014-03-29 ENCOUNTER — Encounter (HOSPITAL_COMMUNITY): Payer: Medicare Other

## 2014-03-31 ENCOUNTER — Telehealth (HOSPITAL_COMMUNITY): Payer: Self-pay | Admitting: Family Medicine

## 2014-03-31 ENCOUNTER — Encounter (HOSPITAL_COMMUNITY): Payer: Medicare Other

## 2014-04-02 ENCOUNTER — Ambulatory Visit (INDEPENDENT_AMBULATORY_CARE_PROVIDER_SITE_OTHER): Payer: Medicare Other | Admitting: *Deleted

## 2014-04-02 ENCOUNTER — Encounter (HOSPITAL_COMMUNITY)
Admission: RE | Admit: 2014-04-02 | Discharge: 2014-04-02 | Disposition: A | Discharge status: Home / Self Care | Payer: Medicare Other | Source: Ambulatory Visit | Attending: Cardiology | Admitting: Cardiology

## 2014-04-02 DIAGNOSIS — I509 Heart failure, unspecified: Secondary | ICD-10-CM | POA: Diagnosis not present

## 2014-04-02 DIAGNOSIS — Z79899 Other long term (current) drug therapy: Secondary | ICD-10-CM | POA: Diagnosis not present

## 2014-04-02 DIAGNOSIS — I2582 Chronic total occlusion of coronary artery: Secondary | ICD-10-CM | POA: Insufficient documentation

## 2014-04-02 DIAGNOSIS — I4891 Unspecified atrial fibrillation: Secondary | ICD-10-CM

## 2014-04-02 DIAGNOSIS — Z5189 Encounter for other specified aftercare: Secondary | ICD-10-CM | POA: Insufficient documentation

## 2014-04-02 DIAGNOSIS — Z5181 Encounter for therapeutic drug level monitoring: Secondary | ICD-10-CM | POA: Diagnosis not present

## 2014-04-02 DIAGNOSIS — Z87891 Personal history of nicotine dependence: Secondary | ICD-10-CM | POA: Diagnosis not present

## 2014-04-02 DIAGNOSIS — Z7901 Long term (current) use of anticoagulants: Secondary | ICD-10-CM

## 2014-04-02 DIAGNOSIS — R911 Solitary pulmonary nodule: Secondary | ICD-10-CM | POA: Insufficient documentation

## 2014-04-02 DIAGNOSIS — I10 Essential (primary) hypertension: Secondary | ICD-10-CM | POA: Diagnosis not present

## 2014-04-02 DIAGNOSIS — E669 Obesity, unspecified: Secondary | ICD-10-CM | POA: Insufficient documentation

## 2014-04-02 DIAGNOSIS — I252 Old myocardial infarction: Secondary | ICD-10-CM | POA: Diagnosis not present

## 2014-04-02 DIAGNOSIS — Z7902 Long term (current) use of antithrombotics/antiplatelets: Secondary | ICD-10-CM | POA: Insufficient documentation

## 2014-04-02 DIAGNOSIS — Z882 Allergy status to sulfonamides status: Secondary | ICD-10-CM | POA: Insufficient documentation

## 2014-04-02 DIAGNOSIS — Z7982 Long term (current) use of aspirin: Secondary | ICD-10-CM | POA: Diagnosis not present

## 2014-04-02 DIAGNOSIS — I214 Non-ST elevation (NSTEMI) myocardial infarction: Secondary | ICD-10-CM | POA: Diagnosis not present

## 2014-04-02 DIAGNOSIS — E785 Hyperlipidemia, unspecified: Secondary | ICD-10-CM | POA: Diagnosis not present

## 2014-04-02 DIAGNOSIS — Z888 Allergy status to other drugs, medicaments and biological substances status: Secondary | ICD-10-CM | POA: Diagnosis not present

## 2014-04-02 DIAGNOSIS — I5031 Acute diastolic (congestive) heart failure: Secondary | ICD-10-CM | POA: Insufficient documentation

## 2014-04-02 DIAGNOSIS — I2510 Atherosclerotic heart disease of native coronary artery without angina pectoris: Secondary | ICD-10-CM | POA: Diagnosis not present

## 2014-04-02 LAB — POCT INR: INR: 3.7

## 2014-04-05 ENCOUNTER — Encounter (HOSPITAL_COMMUNITY)
Admission: RE | Admit: 2014-04-05 | Discharge: 2014-04-05 | Disposition: A | Discharge status: Home / Self Care | Payer: Medicare Other | Source: Ambulatory Visit | Attending: Cardiology | Admitting: Cardiology

## 2014-04-05 DIAGNOSIS — I252 Old myocardial infarction: Secondary | ICD-10-CM | POA: Diagnosis not present

## 2014-04-05 DIAGNOSIS — Z5189 Encounter for other specified aftercare: Secondary | ICD-10-CM | POA: Diagnosis not present

## 2014-04-05 DIAGNOSIS — I5031 Acute diastolic (congestive) heart failure: Secondary | ICD-10-CM | POA: Diagnosis not present

## 2014-04-05 DIAGNOSIS — I2510 Atherosclerotic heart disease of native coronary artery without angina pectoris: Secondary | ICD-10-CM | POA: Diagnosis not present

## 2014-04-05 DIAGNOSIS — I4891 Unspecified atrial fibrillation: Secondary | ICD-10-CM | POA: Diagnosis not present

## 2014-04-05 DIAGNOSIS — I2582 Chronic total occlusion of coronary artery: Secondary | ICD-10-CM | POA: Diagnosis not present

## 2014-04-06 ENCOUNTER — Inpatient Hospital Stay (HOSPITAL_COMMUNITY): Admission: RE | Admit: 2014-04-06 | Payer: Medicare Other | Source: Ambulatory Visit

## 2014-04-06 ENCOUNTER — Ambulatory Visit: Payer: Medicare Other | Admitting: Vascular Surgery

## 2014-04-07 ENCOUNTER — Encounter (HOSPITAL_COMMUNITY)
Admission: RE | Admit: 2014-04-07 | Discharge: 2014-04-07 | Disposition: A | Discharge status: Home / Self Care | Payer: Medicare Other | Source: Ambulatory Visit | Attending: Cardiology | Admitting: Cardiology

## 2014-04-07 DIAGNOSIS — I2510 Atherosclerotic heart disease of native coronary artery without angina pectoris: Secondary | ICD-10-CM | POA: Diagnosis not present

## 2014-04-07 DIAGNOSIS — I2582 Chronic total occlusion of coronary artery: Secondary | ICD-10-CM | POA: Diagnosis not present

## 2014-04-07 DIAGNOSIS — I4891 Unspecified atrial fibrillation: Secondary | ICD-10-CM | POA: Diagnosis not present

## 2014-04-07 DIAGNOSIS — I5031 Acute diastolic (congestive) heart failure: Secondary | ICD-10-CM | POA: Diagnosis not present

## 2014-04-07 DIAGNOSIS — Z5189 Encounter for other specified aftercare: Secondary | ICD-10-CM | POA: Diagnosis not present

## 2014-04-07 DIAGNOSIS — I252 Old myocardial infarction: Secondary | ICD-10-CM | POA: Diagnosis not present

## 2014-04-09 ENCOUNTER — Encounter (HOSPITAL_COMMUNITY): Payer: Medicare Other

## 2014-04-12 ENCOUNTER — Encounter (HOSPITAL_COMMUNITY)
Admission: RE | Admit: 2014-04-12 | Discharge: 2014-04-12 | Disposition: A | Discharge status: Home / Self Care | Payer: Medicare Other | Source: Ambulatory Visit | Attending: Cardiology | Admitting: Cardiology

## 2014-04-12 DIAGNOSIS — I252 Old myocardial infarction: Secondary | ICD-10-CM | POA: Diagnosis not present

## 2014-04-12 DIAGNOSIS — I5031 Acute diastolic (congestive) heart failure: Secondary | ICD-10-CM | POA: Diagnosis not present

## 2014-04-12 DIAGNOSIS — I4891 Unspecified atrial fibrillation: Secondary | ICD-10-CM | POA: Diagnosis not present

## 2014-04-12 DIAGNOSIS — I2510 Atherosclerotic heart disease of native coronary artery without angina pectoris: Secondary | ICD-10-CM | POA: Diagnosis not present

## 2014-04-12 DIAGNOSIS — Z5189 Encounter for other specified aftercare: Secondary | ICD-10-CM | POA: Diagnosis not present

## 2014-04-12 DIAGNOSIS — I2582 Chronic total occlusion of coronary artery: Secondary | ICD-10-CM | POA: Diagnosis not present

## 2014-04-14 ENCOUNTER — Encounter (HOSPITAL_COMMUNITY): Payer: Medicare Other

## 2014-04-16 ENCOUNTER — Other Ambulatory Visit: Payer: Self-pay

## 2014-04-16 ENCOUNTER — Telehealth (HOSPITAL_COMMUNITY): Payer: Self-pay | Admitting: *Deleted

## 2014-04-16 ENCOUNTER — Encounter (HOSPITAL_COMMUNITY): Admission: RE | Admit: 2014-04-16 | Payer: Medicare Other | Source: Ambulatory Visit

## 2014-04-16 MED ORDER — METOPROLOL SUCCINATE ER 25 MG PO TB24: ORAL_TABLET | ORAL | Status: DC

## 2014-04-19 ENCOUNTER — Encounter: Payer: Self-pay | Admitting: Family

## 2014-04-19 ENCOUNTER — Encounter (HOSPITAL_COMMUNITY)
Admission: RE | Admit: 2014-04-19 | Discharge: 2014-04-19 | Disposition: A | Discharge status: Home / Self Care | Payer: Medicare Other | Source: Ambulatory Visit | Attending: Cardiology | Admitting: Cardiology

## 2014-04-19 DIAGNOSIS — Z5189 Encounter for other specified aftercare: Secondary | ICD-10-CM | POA: Diagnosis not present

## 2014-04-19 DIAGNOSIS — I2582 Chronic total occlusion of coronary artery: Secondary | ICD-10-CM | POA: Diagnosis not present

## 2014-04-19 DIAGNOSIS — I4891 Unspecified atrial fibrillation: Secondary | ICD-10-CM | POA: Diagnosis not present

## 2014-04-19 DIAGNOSIS — I252 Old myocardial infarction: Secondary | ICD-10-CM | POA: Diagnosis not present

## 2014-04-19 DIAGNOSIS — I5031 Acute diastolic (congestive) heart failure: Secondary | ICD-10-CM | POA: Diagnosis not present

## 2014-04-19 DIAGNOSIS — I2510 Atherosclerotic heart disease of native coronary artery without angina pectoris: Secondary | ICD-10-CM | POA: Diagnosis not present

## 2014-04-20 ENCOUNTER — Ambulatory Visit (INDEPENDENT_AMBULATORY_CARE_PROVIDER_SITE_OTHER): Payer: Medicare Other | Admitting: Family

## 2014-04-20 ENCOUNTER — Ambulatory Visit (INDEPENDENT_AMBULATORY_CARE_PROVIDER_SITE_OTHER): Payer: Medicare Other | Admitting: Pharmacist

## 2014-04-20 ENCOUNTER — Encounter: Payer: Self-pay | Admitting: Family

## 2014-04-20 ENCOUNTER — Ambulatory Visit (HOSPITAL_COMMUNITY)
Admission: RE | Admit: 2014-04-20 | Discharge: 2014-04-20 | Disposition: A | Discharge status: Home / Self Care | Payer: Medicare Other | Source: Ambulatory Visit | Attending: Family | Admitting: Family

## 2014-04-20 VITALS — BP 135/70 | HR 62 | Resp 16 | Ht 68.0 in | Wt 223.0 lb

## 2014-04-20 DIAGNOSIS — I4891 Unspecified atrial fibrillation: Secondary | ICD-10-CM

## 2014-04-20 DIAGNOSIS — I6529 Occlusion and stenosis of unspecified carotid artery: Secondary | ICD-10-CM

## 2014-04-20 DIAGNOSIS — Z48812 Encounter for surgical aftercare following surgery on the circulatory system: Secondary | ICD-10-CM | POA: Diagnosis not present

## 2014-04-20 DIAGNOSIS — I214 Non-ST elevation (NSTEMI) myocardial infarction: Secondary | ICD-10-CM | POA: Diagnosis not present

## 2014-04-20 DIAGNOSIS — Z7901 Long term (current) use of anticoagulants: Secondary | ICD-10-CM

## 2014-04-20 DIAGNOSIS — Z5181 Encounter for therapeutic drug level monitoring: Secondary | ICD-10-CM

## 2014-04-20 LAB — POCT INR: INR: 4
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
## 2014-04-20 NOTE — Patient Instructions (Signed)

## 2014-04-21 ENCOUNTER — Encounter (HOSPITAL_COMMUNITY)
Admission: RE | Admit: 2014-04-21 | Discharge: 2014-04-21 | Disposition: A | Discharge status: Home / Self Care | Payer: Medicare Other | Source: Ambulatory Visit | Attending: Cardiology | Admitting: Cardiology

## 2014-04-21 DIAGNOSIS — I5031 Acute diastolic (congestive) heart failure: Secondary | ICD-10-CM | POA: Diagnosis not present

## 2014-04-21 DIAGNOSIS — I252 Old myocardial infarction: Secondary | ICD-10-CM | POA: Diagnosis not present

## 2014-04-21 DIAGNOSIS — I2582 Chronic total occlusion of coronary artery: Secondary | ICD-10-CM | POA: Diagnosis not present

## 2014-04-21 DIAGNOSIS — I4891 Unspecified atrial fibrillation: Secondary | ICD-10-CM | POA: Diagnosis not present

## 2014-04-21 DIAGNOSIS — I2510 Atherosclerotic heart disease of native coronary artery without angina pectoris: Secondary | ICD-10-CM | POA: Diagnosis not present

## 2014-04-21 DIAGNOSIS — Z5189 Encounter for other specified aftercare: Secondary | ICD-10-CM | POA: Diagnosis not present

## 2014-04-23 ENCOUNTER — Encounter (HOSPITAL_COMMUNITY)
Admission: RE | Admit: 2014-04-23 | Discharge: 2014-04-23 | Disposition: A | Discharge status: Home / Self Care | Payer: Medicare Other | Source: Ambulatory Visit | Attending: Cardiology | Admitting: Cardiology

## 2014-04-23 DIAGNOSIS — I2510 Atherosclerotic heart disease of native coronary artery without angina pectoris: Secondary | ICD-10-CM | POA: Diagnosis not present

## 2014-04-23 DIAGNOSIS — I2582 Chronic total occlusion of coronary artery: Secondary | ICD-10-CM | POA: Diagnosis not present

## 2014-04-23 DIAGNOSIS — I5031 Acute diastolic (congestive) heart failure: Secondary | ICD-10-CM | POA: Diagnosis not present

## 2014-04-23 DIAGNOSIS — Z5189 Encounter for other specified aftercare: Secondary | ICD-10-CM | POA: Diagnosis not present

## 2014-04-23 DIAGNOSIS — I4891 Unspecified atrial fibrillation: Secondary | ICD-10-CM | POA: Diagnosis not present

## 2014-04-23 DIAGNOSIS — I252 Old myocardial infarction: Secondary | ICD-10-CM | POA: Diagnosis not present

## 2014-04-26 ENCOUNTER — Encounter (HOSPITAL_COMMUNITY): Payer: Medicare Other

## 2014-04-28 ENCOUNTER — Encounter (HOSPITAL_COMMUNITY)
Admission: RE | Admit: 2014-04-28 | Discharge: 2014-04-28 | Disposition: A | Discharge status: Home / Self Care | Payer: Medicare Other | Source: Ambulatory Visit | Attending: Cardiology | Admitting: Cardiology

## 2014-04-28 DIAGNOSIS — I2582 Chronic total occlusion of coronary artery: Secondary | ICD-10-CM | POA: Diagnosis not present

## 2014-04-28 DIAGNOSIS — I2510 Atherosclerotic heart disease of native coronary artery without angina pectoris: Secondary | ICD-10-CM | POA: Diagnosis not present

## 2014-04-28 DIAGNOSIS — I5031 Acute diastolic (congestive) heart failure: Secondary | ICD-10-CM | POA: Diagnosis not present

## 2014-04-28 DIAGNOSIS — I252 Old myocardial infarction: Secondary | ICD-10-CM | POA: Diagnosis not present

## 2014-04-28 DIAGNOSIS — Z5189 Encounter for other specified aftercare: Secondary | ICD-10-CM | POA: Diagnosis not present

## 2014-04-28 DIAGNOSIS — I4891 Unspecified atrial fibrillation: Secondary | ICD-10-CM | POA: Diagnosis not present

## 2014-04-30 ENCOUNTER — Encounter (HOSPITAL_COMMUNITY): Payer: Medicare Other

## 2014-04-30 DIAGNOSIS — Z882 Allergy status to sulfonamides status: Secondary | ICD-10-CM | POA: Insufficient documentation

## 2014-04-30 DIAGNOSIS — I252 Old myocardial infarction: Secondary | ICD-10-CM | POA: Insufficient documentation

## 2014-04-30 DIAGNOSIS — Z5189 Encounter for other specified aftercare: Secondary | ICD-10-CM | POA: Insufficient documentation

## 2014-04-30 DIAGNOSIS — I5031 Acute diastolic (congestive) heart failure: Secondary | ICD-10-CM | POA: Insufficient documentation

## 2014-04-30 DIAGNOSIS — Z7902 Long term (current) use of antithrombotics/antiplatelets: Secondary | ICD-10-CM | POA: Insufficient documentation

## 2014-04-30 DIAGNOSIS — Z888 Allergy status to other drugs, medicaments and biological substances status: Secondary | ICD-10-CM | POA: Insufficient documentation

## 2014-04-30 DIAGNOSIS — Z87891 Personal history of nicotine dependence: Secondary | ICD-10-CM | POA: Insufficient documentation

## 2014-04-30 DIAGNOSIS — Z7982 Long term (current) use of aspirin: Secondary | ICD-10-CM | POA: Insufficient documentation

## 2014-04-30 DIAGNOSIS — I2582 Chronic total occlusion of coronary artery: Secondary | ICD-10-CM | POA: Insufficient documentation

## 2014-04-30 DIAGNOSIS — I2510 Atherosclerotic heart disease of native coronary artery without angina pectoris: Secondary | ICD-10-CM | POA: Insufficient documentation

## 2014-04-30 DIAGNOSIS — I4891 Unspecified atrial fibrillation: Secondary | ICD-10-CM | POA: Insufficient documentation

## 2014-04-30 DIAGNOSIS — I10 Essential (primary) hypertension: Secondary | ICD-10-CM | POA: Insufficient documentation

## 2014-04-30 DIAGNOSIS — Z7901 Long term (current) use of anticoagulants: Secondary | ICD-10-CM | POA: Insufficient documentation

## 2014-04-30 DIAGNOSIS — Z79899 Other long term (current) drug therapy: Secondary | ICD-10-CM | POA: Insufficient documentation

## 2014-04-30 DIAGNOSIS — E785 Hyperlipidemia, unspecified: Secondary | ICD-10-CM | POA: Insufficient documentation

## 2014-04-30 DIAGNOSIS — R911 Solitary pulmonary nodule: Secondary | ICD-10-CM | POA: Insufficient documentation

## 2014-04-30 DIAGNOSIS — I509 Heart failure, unspecified: Secondary | ICD-10-CM | POA: Insufficient documentation

## 2014-04-30 DIAGNOSIS — E669 Obesity, unspecified: Secondary | ICD-10-CM | POA: Insufficient documentation

## 2014-05-03 ENCOUNTER — Encounter (HOSPITAL_COMMUNITY)
Admission: RE | Admit: 2014-05-03 | Discharge: 2014-05-03 | Disposition: A | Discharge status: Home / Self Care | Payer: Medicare Other | Source: Ambulatory Visit | Attending: Cardiology | Admitting: Cardiology

## 2014-05-03 ENCOUNTER — Ambulatory Visit (INDEPENDENT_AMBULATORY_CARE_PROVIDER_SITE_OTHER): Payer: Medicare Other | Admitting: *Deleted

## 2014-05-03 DIAGNOSIS — I4891 Unspecified atrial fibrillation: Secondary | ICD-10-CM

## 2014-05-03 DIAGNOSIS — Z87891 Personal history of nicotine dependence: Secondary | ICD-10-CM | POA: Diagnosis not present

## 2014-05-03 DIAGNOSIS — R911 Solitary pulmonary nodule: Secondary | ICD-10-CM | POA: Diagnosis not present

## 2014-05-03 DIAGNOSIS — I10 Essential (primary) hypertension: Secondary | ICD-10-CM | POA: Diagnosis not present

## 2014-05-03 DIAGNOSIS — Z7902 Long term (current) use of antithrombotics/antiplatelets: Secondary | ICD-10-CM | POA: Diagnosis not present

## 2014-05-03 DIAGNOSIS — Z7901 Long term (current) use of anticoagulants: Secondary | ICD-10-CM

## 2014-05-03 DIAGNOSIS — I214 Non-ST elevation (NSTEMI) myocardial infarction: Secondary | ICD-10-CM | POA: Diagnosis not present

## 2014-05-03 DIAGNOSIS — I2510 Atherosclerotic heart disease of native coronary artery without angina pectoris: Secondary | ICD-10-CM | POA: Diagnosis not present

## 2014-05-03 DIAGNOSIS — Z888 Allergy status to other drugs, medicaments and biological substances status: Secondary | ICD-10-CM | POA: Diagnosis not present

## 2014-05-03 DIAGNOSIS — Z5181 Encounter for therapeutic drug level monitoring: Secondary | ICD-10-CM

## 2014-05-03 DIAGNOSIS — Z882 Allergy status to sulfonamides status: Secondary | ICD-10-CM | POA: Diagnosis not present

## 2014-05-03 DIAGNOSIS — I509 Heart failure, unspecified: Secondary | ICD-10-CM | POA: Diagnosis not present

## 2014-05-03 DIAGNOSIS — Z79899 Other long term (current) drug therapy: Secondary | ICD-10-CM | POA: Diagnosis not present

## 2014-05-03 DIAGNOSIS — E669 Obesity, unspecified: Secondary | ICD-10-CM | POA: Diagnosis not present

## 2014-05-03 DIAGNOSIS — I5031 Acute diastolic (congestive) heart failure: Secondary | ICD-10-CM | POA: Diagnosis not present

## 2014-05-03 DIAGNOSIS — Z5189 Encounter for other specified aftercare: Secondary | ICD-10-CM | POA: Diagnosis not present

## 2014-05-03 DIAGNOSIS — E785 Hyperlipidemia, unspecified: Secondary | ICD-10-CM | POA: Diagnosis not present

## 2014-05-03 DIAGNOSIS — I2582 Chronic total occlusion of coronary artery: Secondary | ICD-10-CM | POA: Diagnosis not present

## 2014-05-03 DIAGNOSIS — Z7982 Long term (current) use of aspirin: Secondary | ICD-10-CM | POA: Diagnosis not present

## 2014-05-03 DIAGNOSIS — I252 Old myocardial infarction: Secondary | ICD-10-CM | POA: Diagnosis not present

## 2014-05-03 LAB — POCT INR: INR: 4

## 2014-05-05 ENCOUNTER — Encounter (HOSPITAL_COMMUNITY): Payer: Medicare Other

## 2014-05-07 ENCOUNTER — Encounter (HOSPITAL_COMMUNITY): Payer: Medicare Other

## 2014-05-10 ENCOUNTER — Encounter (HOSPITAL_COMMUNITY): Payer: Medicare Other

## 2014-05-10 ENCOUNTER — Other Ambulatory Visit: Payer: Self-pay | Admitting: Nurse Practitioner

## 2014-05-12 ENCOUNTER — Encounter (HOSPITAL_COMMUNITY): Payer: Medicare Other

## 2014-05-14 ENCOUNTER — Encounter (HOSPITAL_COMMUNITY): Payer: Medicare Other

## 2014-05-17 ENCOUNTER — Ambulatory Visit (INDEPENDENT_AMBULATORY_CARE_PROVIDER_SITE_OTHER): Payer: Medicare Other | Admitting: *Deleted

## 2014-05-17 ENCOUNTER — Ambulatory Visit (INDEPENDENT_AMBULATORY_CARE_PROVIDER_SITE_OTHER): Payer: Medicare Other | Admitting: Cardiology

## 2014-05-17 ENCOUNTER — Encounter: Payer: Self-pay | Admitting: Cardiology

## 2014-05-17 ENCOUNTER — Encounter (HOSPITAL_COMMUNITY): Payer: Medicare Other

## 2014-05-17 VITALS — BP 168/88 | HR 70 | Ht 68.0 in | Wt 226.8 lb

## 2014-05-17 DIAGNOSIS — I2510 Atherosclerotic heart disease of native coronary artery without angina pectoris: Secondary | ICD-10-CM | POA: Diagnosis not present

## 2014-05-17 DIAGNOSIS — Z7901 Long term (current) use of anticoagulants: Secondary | ICD-10-CM

## 2014-05-17 DIAGNOSIS — I4891 Unspecified atrial fibrillation: Secondary | ICD-10-CM

## 2014-05-17 DIAGNOSIS — I10 Essential (primary) hypertension: Secondary | ICD-10-CM | POA: Diagnosis not present

## 2014-05-17 DIAGNOSIS — E785 Hyperlipidemia, unspecified: Secondary | ICD-10-CM

## 2014-05-17 DIAGNOSIS — I214 Non-ST elevation (NSTEMI) myocardial infarction: Secondary | ICD-10-CM

## 2014-05-17 DIAGNOSIS — Z5181 Encounter for therapeutic drug level monitoring: Secondary | ICD-10-CM

## 2014-05-17 DIAGNOSIS — I6529 Occlusion and stenosis of unspecified carotid artery: Secondary | ICD-10-CM

## 2014-05-17 LAB — POCT INR: INR: 2.6

## 2014-05-17 NOTE — Patient Instructions (Signed)
Stop taking ASA  Continue your other therapy  I will see you in 4 months with fasting lab work
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
## 2014-05-19 ENCOUNTER — Other Ambulatory Visit: Payer: Self-pay | Admitting: Cardiology

## 2014-05-19 ENCOUNTER — Encounter (HOSPITAL_COMMUNITY): Payer: Medicare Other

## 2014-05-19 ENCOUNTER — Other Ambulatory Visit (HOSPITAL_COMMUNITY): Payer: Self-pay | Admitting: Physician Assistant

## 2014-05-20 ENCOUNTER — Other Ambulatory Visit: Payer: Self-pay | Admitting: Cardiology

## 2014-05-21 ENCOUNTER — Encounter (HOSPITAL_COMMUNITY): Payer: Medicare Other

## 2014-05-26 ENCOUNTER — Encounter (HOSPITAL_COMMUNITY): Payer: Medicare Other

## 2014-05-28 ENCOUNTER — Encounter (HOSPITAL_COMMUNITY): Payer: Medicare Other

## 2014-05-28 ENCOUNTER — Telehealth (HOSPITAL_COMMUNITY): Payer: Self-pay | Admitting: *Deleted

## 2014-05-28 DIAGNOSIS — E785 Hyperlipidemia, unspecified: Secondary | ICD-10-CM | POA: Diagnosis not present

## 2014-05-28 DIAGNOSIS — Z23 Encounter for immunization: Secondary | ICD-10-CM | POA: Diagnosis not present

## 2014-05-28 DIAGNOSIS — I10 Essential (primary) hypertension: Secondary | ICD-10-CM | POA: Diagnosis not present

## 2014-05-28 DIAGNOSIS — F329 Major depressive disorder, single episode, unspecified: Secondary | ICD-10-CM | POA: Diagnosis not present

## 2014-05-28 DIAGNOSIS — Z111 Encounter for screening for respiratory tuberculosis: Secondary | ICD-10-CM | POA: Diagnosis not present

## 2014-05-28 DIAGNOSIS — K2100 Gastro-esophageal reflux disease with esophagitis, without bleeding: Secondary | ICD-10-CM | POA: Diagnosis not present

## 2014-05-28 DIAGNOSIS — F3289 Other specified depressive episodes: Secondary | ICD-10-CM | POA: Diagnosis not present

## 2014-05-28 NOTE — Telephone Encounter (Signed)
Pt indicated on a prior phone call that she would like for today - 5/29 be her last day of exercise.  Pt did not show or call for today.  Message left regarding if she wanted to come next week or she indeed wanted today to be her last day.  Informed that next week would be the last week of participation in Cardiac Rehab.  Pt 18 week period of participation ends on June 5.  Contact information left for call back.

## 2014-05-31 ENCOUNTER — Encounter (HOSPITAL_COMMUNITY): Payer: Medicare Other

## 2014-06-02 ENCOUNTER — Encounter (HOSPITAL_COMMUNITY): Payer: Medicare Other

## 2014-06-04 ENCOUNTER — Encounter (HOSPITAL_COMMUNITY): Payer: Medicare Other

## 2014-06-08 ENCOUNTER — Ambulatory Visit (INDEPENDENT_AMBULATORY_CARE_PROVIDER_SITE_OTHER): Payer: Medicare Other | Admitting: *Deleted

## 2014-06-08 DIAGNOSIS — I214 Non-ST elevation (NSTEMI) myocardial infarction: Secondary | ICD-10-CM

## 2014-06-08 DIAGNOSIS — Z7901 Long term (current) use of anticoagulants: Secondary | ICD-10-CM

## 2014-06-08 DIAGNOSIS — Z5181 Encounter for therapeutic drug level monitoring: Secondary | ICD-10-CM | POA: Diagnosis not present

## 2014-06-08 DIAGNOSIS — I4891 Unspecified atrial fibrillation: Secondary | ICD-10-CM

## 2014-06-08 LAB — POCT INR: INR: 3.6

## 2014-06-23 ENCOUNTER — Ambulatory Visit (INDEPENDENT_AMBULATORY_CARE_PROVIDER_SITE_OTHER): Payer: Medicare Other | Admitting: *Deleted

## 2014-06-23 DIAGNOSIS — I4891 Unspecified atrial fibrillation: Secondary | ICD-10-CM | POA: Diagnosis not present

## 2014-06-23 DIAGNOSIS — I214 Non-ST elevation (NSTEMI) myocardial infarction: Secondary | ICD-10-CM

## 2014-06-23 DIAGNOSIS — Z5181 Encounter for therapeutic drug level monitoring: Secondary | ICD-10-CM

## 2014-06-23 DIAGNOSIS — Z7901 Long term (current) use of anticoagulants: Secondary | ICD-10-CM

## 2014-06-23 LAB — POCT INR: INR: 3

## 2014-07-15 ENCOUNTER — Ambulatory Visit (INDEPENDENT_AMBULATORY_CARE_PROVIDER_SITE_OTHER): Payer: Medicare Other

## 2014-07-15 DIAGNOSIS — Z7901 Long term (current) use of anticoagulants: Secondary | ICD-10-CM | POA: Diagnosis not present

## 2014-07-15 DIAGNOSIS — I4891 Unspecified atrial fibrillation: Secondary | ICD-10-CM

## 2014-07-15 DIAGNOSIS — I214 Non-ST elevation (NSTEMI) myocardial infarction: Secondary | ICD-10-CM

## 2014-07-15 DIAGNOSIS — Z5181 Encounter for therapeutic drug level monitoring: Secondary | ICD-10-CM | POA: Diagnosis not present

## 2014-07-15 LAB — POCT INR: INR: 2.4

## 2014-07-21 ENCOUNTER — Other Ambulatory Visit: Payer: Self-pay | Admitting: Cardiology

## 2014-07-23 DIAGNOSIS — M9981 Other biomechanical lesions of cervical region: Secondary | ICD-10-CM | POA: Diagnosis not present

## 2014-07-23 DIAGNOSIS — M999 Biomechanical lesion, unspecified: Secondary | ICD-10-CM | POA: Diagnosis not present

## 2014-07-23 DIAGNOSIS — M5030 Other cervical disc degeneration, unspecified cervical region: Secondary | ICD-10-CM | POA: Diagnosis not present

## 2014-07-23 DIAGNOSIS — M5137 Other intervertebral disc degeneration, lumbosacral region: Secondary | ICD-10-CM | POA: Diagnosis not present

## 2014-08-12 ENCOUNTER — Ambulatory Visit (INDEPENDENT_AMBULATORY_CARE_PROVIDER_SITE_OTHER): Payer: Medicare Other | Admitting: Pharmacist

## 2014-08-12 DIAGNOSIS — I214 Non-ST elevation (NSTEMI) myocardial infarction: Secondary | ICD-10-CM | POA: Diagnosis not present

## 2014-08-12 DIAGNOSIS — Z5181 Encounter for therapeutic drug level monitoring: Secondary | ICD-10-CM | POA: Diagnosis not present

## 2014-08-12 DIAGNOSIS — I4891 Unspecified atrial fibrillation: Secondary | ICD-10-CM

## 2014-08-12 DIAGNOSIS — Z7901 Long term (current) use of anticoagulants: Secondary | ICD-10-CM

## 2014-08-12 LAB — POCT INR: INR: 3.2

## 2014-09-07 ENCOUNTER — Ambulatory Visit (INDEPENDENT_AMBULATORY_CARE_PROVIDER_SITE_OTHER): Payer: Medicare Other | Admitting: Pharmacist Clinician (PhC)/ Clinical Pharmacy Specialist

## 2014-09-07 DIAGNOSIS — Z5181 Encounter for therapeutic drug level monitoring: Secondary | ICD-10-CM | POA: Diagnosis not present

## 2014-09-07 DIAGNOSIS — I4891 Unspecified atrial fibrillation: Secondary | ICD-10-CM | POA: Diagnosis not present

## 2014-09-07 DIAGNOSIS — I214 Non-ST elevation (NSTEMI) myocardial infarction: Secondary | ICD-10-CM

## 2014-09-07 DIAGNOSIS — Z7901 Long term (current) use of anticoagulants: Secondary | ICD-10-CM | POA: Diagnosis not present

## 2014-09-07 LAB — POCT INR: INR: 2.2

## 2014-10-08 ENCOUNTER — Ambulatory Visit (INDEPENDENT_AMBULATORY_CARE_PROVIDER_SITE_OTHER): Payer: Medicare Other | Admitting: Pharmacist Clinician (PhC)/ Clinical Pharmacy Specialist

## 2014-10-08 ENCOUNTER — Ambulatory Visit (INDEPENDENT_AMBULATORY_CARE_PROVIDER_SITE_OTHER): Payer: Medicare Other | Admitting: Cardiology

## 2014-10-08 ENCOUNTER — Encounter: Payer: Self-pay | Admitting: Cardiology

## 2014-10-08 VITALS — BP 110/60 | HR 57 | Ht 68.0 in | Wt 224.0 lb

## 2014-10-08 DIAGNOSIS — I4891 Unspecified atrial fibrillation: Secondary | ICD-10-CM

## 2014-10-08 DIAGNOSIS — I481 Persistent atrial fibrillation: Secondary | ICD-10-CM

## 2014-10-08 DIAGNOSIS — Z5181 Encounter for therapeutic drug level monitoring: Secondary | ICD-10-CM

## 2014-10-08 DIAGNOSIS — Z7901 Long term (current) use of anticoagulants: Secondary | ICD-10-CM | POA: Diagnosis not present

## 2014-10-08 DIAGNOSIS — I214 Non-ST elevation (NSTEMI) myocardial infarction: Secondary | ICD-10-CM | POA: Diagnosis not present

## 2014-10-08 DIAGNOSIS — I4819 Other persistent atrial fibrillation: Secondary | ICD-10-CM

## 2014-10-08 DIAGNOSIS — I6529 Occlusion and stenosis of unspecified carotid artery: Secondary | ICD-10-CM

## 2014-10-08 DIAGNOSIS — I2510 Atherosclerotic heart disease of native coronary artery without angina pectoris: Secondary | ICD-10-CM | POA: Diagnosis not present

## 2014-10-08 LAB — HEPATIC FUNCTION PANEL
ALK PHOS: 84 U/L (ref 39–117)
ALT: 21 U/L (ref 0–35)
AST: 22 U/L (ref 0–37)
Albumin: 4.3 g/dL (ref 3.5–5.2)
BILIRUBIN DIRECT: 0.1 mg/dL (ref 0.0–0.3)
Indirect Bilirubin: 0.6 mg/dL (ref 0.2–1.2)
Total Bilirubin: 0.7 mg/dL (ref 0.2–1.2)
Total Protein: 7.1 g/dL (ref 6.0–8.3)

## 2014-10-08 LAB — CBC WITH DIFFERENTIAL/PLATELET
BASOS ABS: 0 10*3/uL (ref 0.0–0.1)
BASOS PCT: 0 % (ref 0–1)
EOS ABS: 0.1 10*3/uL (ref 0.0–0.7)
EOS PCT: 1 % (ref 0–5)
HCT: 36.4 % (ref 36.0–46.0)
Hemoglobin: 12.1 g/dL (ref 12.0–15.0)
Lymphocytes Relative: 10 % — ABNORMAL LOW (ref 12–46)
Lymphs Abs: 0.9 10*3/uL (ref 0.7–4.0)
MCH: 25.4 pg — ABNORMAL LOW (ref 26.0–34.0)
MCHC: 33.2 g/dL (ref 30.0–36.0)
MCV: 76.3 fL — AB (ref 78.0–100.0)
MONO ABS: 0.4 10*3/uL (ref 0.1–1.0)
Monocytes Relative: 5 % (ref 3–12)
Neutro Abs: 7.4 10*3/uL (ref 1.7–7.7)
Neutrophils Relative %: 84 % — ABNORMAL HIGH (ref 43–77)
Platelets: 361 10*3/uL (ref 150–400)
RBC: 4.77 MIL/uL (ref 3.87–5.11)
RDW: 15.9 % — AB (ref 11.5–15.5)
WBC: 8.8 10*3/uL (ref 4.0–10.5)

## 2014-10-08 LAB — LIPID PANEL
Cholesterol: 220 mg/dL — ABNORMAL HIGH (ref 0–200)
HDL: 30 mg/dL — ABNORMAL LOW (ref >39)
LDL Cholesterol: 161 mg/dL — ABNORMAL HIGH (ref 0–99)
TRIGLYCERIDES: 144 mg/dL (ref <150)
Total CHOL/HDL Ratio: 7.3 Ratio
VLDL: 29 mg/dL (ref 0–40)

## 2014-10-08 LAB — BASIC METABOLIC PANEL
BUN: 19 mg/dL (ref 6–23)
CHLORIDE: 102 meq/L (ref 96–112)
CO2: 24 mEq/L (ref 19–32)
CREATININE: 0.82 mg/dL (ref 0.50–1.10)
Calcium: 9.6 mg/dL (ref 8.4–10.5)
Glucose, Bld: 141 mg/dL — ABNORMAL HIGH (ref 70–99)
Potassium: 4.9 mEq/L (ref 3.5–5.3)
SODIUM: 138 meq/L (ref 135–145)

## 2014-10-08 LAB — POCT INR: INR: 2.1

## 2014-10-08 MED ORDER — NITROGLYCERIN 0.4 MG SL SUBL: 0.4000 mg | SUBLINGUAL_TABLET | SUBLINGUAL | Status: DC | PRN

## 2014-10-08 NOTE — Patient Instructions (Signed)
Continue your current therapy- do not take Meloxicam  We will check lab work today.  I will see you in 6 months.

## 2014-10-09 ENCOUNTER — Encounter: Payer: Self-pay | Admitting: Cardiology
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
## 2014-10-26 ENCOUNTER — Other Ambulatory Visit: Payer: Self-pay | Admitting: Nurse Practitioner

## 2014-11-01 ENCOUNTER — Encounter: Payer: Self-pay | Admitting: Cardiology

## 2014-11-19 ENCOUNTER — Ambulatory Visit (INDEPENDENT_AMBULATORY_CARE_PROVIDER_SITE_OTHER): Payer: Medicare Other | Admitting: *Deleted

## 2014-11-19 DIAGNOSIS — I214 Non-ST elevation (NSTEMI) myocardial infarction: Secondary | ICD-10-CM

## 2014-11-19 DIAGNOSIS — Z5181 Encounter for therapeutic drug level monitoring: Secondary | ICD-10-CM

## 2014-11-19 DIAGNOSIS — Z23 Encounter for immunization: Secondary | ICD-10-CM

## 2014-11-19 DIAGNOSIS — I481 Persistent atrial fibrillation: Secondary | ICD-10-CM | POA: Diagnosis not present

## 2014-11-19 DIAGNOSIS — I4819 Other persistent atrial fibrillation: Secondary | ICD-10-CM

## 2014-11-19 DIAGNOSIS — Z7901 Long term (current) use of anticoagulants: Secondary | ICD-10-CM | POA: Diagnosis not present

## 2014-11-19 DIAGNOSIS — I4891 Unspecified atrial fibrillation: Secondary | ICD-10-CM | POA: Diagnosis not present

## 2014-11-19 LAB — POCT INR: INR: 2.3

## 2014-12-09 ENCOUNTER — Encounter (HOSPITAL_COMMUNITY): Payer: Self-pay | Admitting: Cardiovascular Disease

## 2014-12-09 DIAGNOSIS — F4001 Agoraphobia with panic disorder: Secondary | ICD-10-CM | POA: Diagnosis not present

## 2014-12-17 ENCOUNTER — Ambulatory Visit (INDEPENDENT_AMBULATORY_CARE_PROVIDER_SITE_OTHER): Payer: Medicare Other | Admitting: *Deleted

## 2014-12-17 DIAGNOSIS — I4891 Unspecified atrial fibrillation: Secondary | ICD-10-CM

## 2014-12-17 DIAGNOSIS — Z5181 Encounter for therapeutic drug level monitoring: Secondary | ICD-10-CM

## 2014-12-17 DIAGNOSIS — Z7901 Long term (current) use of anticoagulants: Secondary | ICD-10-CM

## 2014-12-17 DIAGNOSIS — I481 Persistent atrial fibrillation: Secondary | ICD-10-CM

## 2014-12-17 DIAGNOSIS — I4819 Other persistent atrial fibrillation: Secondary | ICD-10-CM

## 2014-12-17 DIAGNOSIS — I214 Non-ST elevation (NSTEMI) myocardial infarction: Secondary | ICD-10-CM

## 2014-12-17 LAB — POCT INR: INR: 1.8

## 2014-12-23 ENCOUNTER — Other Ambulatory Visit: Payer: Self-pay | Admitting: Cardiology

## 2014-12-23 NOTE — Telephone Encounter (Signed)
Follow up      Pt is out of her coumadin.  Please call before leaving.  Coumadin clinic is closed today at [address]

## 2014-12-23 NOTE — Telephone Encounter (Signed)
Rx(s) sent to pharmacy electronically.  

## 2014-12-28 ENCOUNTER — Other Ambulatory Visit: Payer: Self-pay | Admitting: Physician Assistant

## 2014-12-28 NOTE — Telephone Encounter (Signed)
Rx(s) sent to pharmacy electronically.  

## 2015-01-03 ENCOUNTER — Ambulatory Visit (INDEPENDENT_AMBULATORY_CARE_PROVIDER_SITE_OTHER): Payer: Medicare Other

## 2015-01-03 DIAGNOSIS — I4891 Unspecified atrial fibrillation: Secondary | ICD-10-CM

## 2015-01-03 DIAGNOSIS — Z5181 Encounter for therapeutic drug level monitoring: Secondary | ICD-10-CM

## 2015-01-03 DIAGNOSIS — I214 Non-ST elevation (NSTEMI) myocardial infarction: Secondary | ICD-10-CM | POA: Diagnosis not present

## 2015-01-03 DIAGNOSIS — Z7901 Long term (current) use of anticoagulants: Secondary | ICD-10-CM

## 2015-01-03 LAB — POCT INR: INR: 3.1

## 2015-01-14 DIAGNOSIS — M6281 Muscle weakness (generalized): Secondary | ICD-10-CM | POA: Diagnosis not present

## 2015-01-18 ENCOUNTER — Other Ambulatory Visit: Payer: Self-pay | Admitting: Cardiology

## 2015-01-25 DIAGNOSIS — M6281 Muscle weakness (generalized): Secondary | ICD-10-CM | POA: Diagnosis not present

## 2015-01-31 DIAGNOSIS — M6281 Muscle weakness (generalized): Secondary | ICD-10-CM | POA: Diagnosis not present

## 2015-01-31 DIAGNOSIS — R2681 Unsteadiness on feet: Secondary | ICD-10-CM | POA: Diagnosis not present

## 2015-02-02 DIAGNOSIS — R2681 Unsteadiness on feet: Secondary | ICD-10-CM | POA: Diagnosis not present

## 2015-02-02 DIAGNOSIS — M6281 Muscle weakness (generalized): Secondary | ICD-10-CM | POA: Diagnosis not present

## 2015-02-03 ENCOUNTER — Ambulatory Visit (INDEPENDENT_AMBULATORY_CARE_PROVIDER_SITE_OTHER): Payer: Medicare Other | Admitting: Cardiology

## 2015-02-03 DIAGNOSIS — I214 Non-ST elevation (NSTEMI) myocardial infarction: Secondary | ICD-10-CM

## 2015-02-03 DIAGNOSIS — I4891 Unspecified atrial fibrillation: Secondary | ICD-10-CM

## 2015-02-03 DIAGNOSIS — Z5181 Encounter for therapeutic drug level monitoring: Secondary | ICD-10-CM

## 2015-02-03 LAB — PROTIME-INR: INR: 1.9 — AB (ref 0.9–1.1)

## 2015-02-08 DIAGNOSIS — M6281 Muscle weakness (generalized): Secondary | ICD-10-CM | POA: Diagnosis not present

## 2015-02-08 DIAGNOSIS — R2681 Unsteadiness on feet: Secondary | ICD-10-CM | POA: Diagnosis not present

## 2015-02-10 DIAGNOSIS — R2681 Unsteadiness on feet: Secondary | ICD-10-CM | POA: Diagnosis not present

## 2015-02-10 DIAGNOSIS — M6281 Muscle weakness (generalized): Secondary | ICD-10-CM | POA: Diagnosis not present

## 2015-02-15 DIAGNOSIS — M6281 Muscle weakness (generalized): Secondary | ICD-10-CM | POA: Diagnosis not present

## 2015-02-15 DIAGNOSIS — R2681 Unsteadiness on feet: Secondary | ICD-10-CM | POA: Diagnosis not present

## 2015-02-17 ENCOUNTER — Ambulatory Visit (INDEPENDENT_AMBULATORY_CARE_PROVIDER_SITE_OTHER): Payer: Medicare Other | Admitting: Cardiology

## 2015-02-17 DIAGNOSIS — I214 Non-ST elevation (NSTEMI) myocardial infarction: Secondary | ICD-10-CM

## 2015-02-17 DIAGNOSIS — M6281 Muscle weakness (generalized): Secondary | ICD-10-CM | POA: Diagnosis not present

## 2015-02-17 DIAGNOSIS — R2681 Unsteadiness on feet: Secondary | ICD-10-CM | POA: Diagnosis not present

## 2015-02-17 DIAGNOSIS — I4891 Unspecified atrial fibrillation: Secondary | ICD-10-CM

## 2015-02-17 DIAGNOSIS — Z7901 Long term (current) use of anticoagulants: Secondary | ICD-10-CM | POA: Diagnosis not present

## 2015-02-17 DIAGNOSIS — Z5181 Encounter for therapeutic drug level monitoring: Secondary | ICD-10-CM

## 2015-02-17 LAB — PROTIME-INR: INR: 2.4 — AB (ref 0.9–1.1)

## 2015-02-18 ENCOUNTER — Other Ambulatory Visit: Payer: Self-pay | Admitting: Cardiology

## 2015-02-19 ENCOUNTER — Other Ambulatory Visit: Payer: Self-pay | Admitting: Gynecology

## 2015-02-22 DIAGNOSIS — M6281 Muscle weakness (generalized): Secondary | ICD-10-CM | POA: Diagnosis not present

## 2015-02-22 DIAGNOSIS — R2681 Unsteadiness on feet: Secondary | ICD-10-CM | POA: Diagnosis not present

## 2015-02-24 DIAGNOSIS — R2681 Unsteadiness on feet: Secondary | ICD-10-CM | POA: Diagnosis not present

## 2015-02-24 DIAGNOSIS — M6281 Muscle weakness (generalized): Secondary | ICD-10-CM | POA: Diagnosis not present

## 2015-02-25 DIAGNOSIS — R2681 Unsteadiness on feet: Secondary | ICD-10-CM | POA: Diagnosis not present

## 2015-02-25 DIAGNOSIS — M6281 Muscle weakness (generalized): Secondary | ICD-10-CM | POA: Diagnosis not present

## 2015-03-14 ENCOUNTER — Ambulatory Visit (INDEPENDENT_AMBULATORY_CARE_PROVIDER_SITE_OTHER): Payer: Private Health Insurance - Indemnity | Admitting: Internal Medicine

## 2015-03-14 ENCOUNTER — Other Ambulatory Visit: Payer: Self-pay | Admitting: *Deleted

## 2015-03-14 DIAGNOSIS — I4891 Unspecified atrial fibrillation: Secondary | ICD-10-CM | POA: Diagnosis not present

## 2015-03-14 DIAGNOSIS — Z5181 Encounter for therapeutic drug level monitoring: Secondary | ICD-10-CM

## 2015-03-14 DIAGNOSIS — Z7901 Long term (current) use of anticoagulants: Secondary | ICD-10-CM | POA: Diagnosis not present

## 2015-03-14 DIAGNOSIS — I214 Non-ST elevation (NSTEMI) myocardial infarction: Secondary | ICD-10-CM

## 2015-03-14 LAB — PROTIME-INR: INR: 2.1 — AB (ref 0.9–1.1)

## 2015-03-14 MED ORDER — LOSARTAN POTASSIUM 50 MG PO TABS: 50.0000 mg | ORAL_TABLET | Freq: Two times a day (BID) | ORAL | Status: DC

## 2015-03-16 ENCOUNTER — Telehealth: Payer: Self-pay | Admitting: *Deleted

## 2015-03-16 DIAGNOSIS — R2681 Unsteadiness on feet: Secondary | ICD-10-CM | POA: Diagnosis not present

## 2015-03-16 DIAGNOSIS — M6281 Muscle weakness (generalized): Secondary | ICD-10-CM | POA: Diagnosis not present

## 2015-03-16 NOTE — Telephone Encounter (Signed)
Called and left a message with husband  for patient to call back in reference to INR results and dosing instructions. Husband took part of my name and said he will get her the message.

## 2015-03-18 DIAGNOSIS — M6281 Muscle weakness (generalized): Secondary | ICD-10-CM | POA: Diagnosis not present

## 2015-03-18 DIAGNOSIS — R2681 Unsteadiness on feet: Secondary | ICD-10-CM | POA: Diagnosis not present

## 2015-03-22 DIAGNOSIS — M6281 Muscle weakness (generalized): Secondary | ICD-10-CM | POA: Diagnosis not present

## 2015-03-22 DIAGNOSIS — R2681 Unsteadiness on feet: Secondary | ICD-10-CM | POA: Diagnosis not present

## 2015-03-24 ENCOUNTER — Encounter: Payer: Self-pay | Admitting: Family Medicine

## 2015-03-24 ENCOUNTER — Ambulatory Visit (INDEPENDENT_AMBULATORY_CARE_PROVIDER_SITE_OTHER): Payer: Medicare Other | Admitting: Family Medicine

## 2015-03-24 VITALS — BP 110/64 | HR 78 | Temp 97.9°F | Wt 228.0 lb

## 2015-03-24 DIAGNOSIS — M6281 Muscle weakness (generalized): Secondary | ICD-10-CM | POA: Diagnosis not present

## 2015-03-24 DIAGNOSIS — F32A Depression, unspecified: Secondary | ICD-10-CM

## 2015-03-24 DIAGNOSIS — E119 Type 2 diabetes mellitus without complications: Secondary | ICD-10-CM | POA: Diagnosis not present

## 2015-03-24 DIAGNOSIS — I2510 Atherosclerotic heart disease of native coronary artery without angina pectoris: Secondary | ICD-10-CM | POA: Diagnosis not present

## 2015-03-24 DIAGNOSIS — M4800 Spinal stenosis, site unspecified: Secondary | ICD-10-CM | POA: Insufficient documentation

## 2015-03-24 DIAGNOSIS — I6529 Occlusion and stenosis of unspecified carotid artery: Secondary | ICD-10-CM

## 2015-03-24 DIAGNOSIS — I4891 Unspecified atrial fibrillation: Secondary | ICD-10-CM

## 2015-03-24 DIAGNOSIS — R2681 Unsteadiness on feet: Secondary | ICD-10-CM | POA: Diagnosis not present

## 2015-03-24 DIAGNOSIS — E785 Hyperlipidemia, unspecified: Secondary | ICD-10-CM

## 2015-03-24 DIAGNOSIS — K219 Gastro-esophageal reflux disease without esophagitis: Secondary | ICD-10-CM

## 2015-03-24 DIAGNOSIS — F329 Major depressive disorder, single episode, unspecified: Secondary | ICD-10-CM

## 2015-03-24 DIAGNOSIS — I10 Essential (primary) hypertension: Secondary | ICD-10-CM

## 2015-03-24 DIAGNOSIS — Z23 Encounter for immunization: Secondary | ICD-10-CM

## 2015-03-24 LAB — HEMOGLOBIN A1C: Hgb A1c MFr Bld: 7.7 % — ABNORMAL HIGH (ref 4.6–6.5)
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
## 2015-03-24 NOTE — Patient Instructions (Addendum)
Received final Pneumonia shot today ([id number]).  Check a1c today, see you back within 2-3 weeks to discuss current state of your diabetes more. Cut back on the ice cream perhaps to once a week. Keep up the exercise.   Look forward to meeting yoru husband.   Let's get office notes for about 2 years from Dr. Greta Doom as well as any immunization records

## 2015-03-25 DIAGNOSIS — F32A Depression, unspecified: Secondary | ICD-10-CM | POA: Insufficient documentation

## 2015-03-25 DIAGNOSIS — F329 Major depressive disorder, single episode, unspecified: Secondary | ICD-10-CM | POA: Insufficient documentation

## 2015-03-25 DIAGNOSIS — K219 Gastro-esophageal reflux disease without esophagitis: Secondary | ICD-10-CM | POA: Insufficient documentation

## 2015-03-25 NOTE — Assessment & Plan Note (Signed)
Poor control but statin intolerant-also has not tolerated tricor or zetia. Continue exercise and improve eating patterns.

## 2015-03-25 NOTE — Assessment & Plan Note (Signed)
Patient does report a history of falls in the last year. I have asked her to discontinue xanax. We can titrate up prozac if needed to daily as only taking 3 days a week. Has very difficult stressors dealing with husband at home

## 2015-03-25 NOTE — Assessment & Plan Note (Signed)
Controlled on Metoprolol 25 mg xl. Losartan 50mg .

## 2015-03-25 NOTE — Assessment & Plan Note (Signed)
Continue plavix, no asa on coumadin. Statin intolerant. Continue metoprolol with history nstemi. Overall patient has done well and now almost a year out from NSTEMI chest pain free.

## 2015-03-25 NOTE — Assessment & Plan Note (Signed)
Continue coumadin for anticoagulation as well as metoprolol for rate control.

## 2015-03-25 NOTE — Assessment & Plan Note (Signed)
a1c 7.1 a year ago and 7.7 today. I informed patient she does in fact have diabetes with a1c 7.1 previously. She is going to return to update health maintenance and to discuss medication options vs. Lifestyle changes for treating her now slightly poorly controlled diabetes. Having ice cream nightly and possible we can control this without medication. Already exercising.

## 2015-03-25 NOTE — Assessment & Plan Note (Signed)
History endarterectomy on R. <40% stenosis bilaterally in 03/2014 and will have repeat this year. Would really benefit from statin but intolerant as above.

## 2015-03-29 DIAGNOSIS — R2681 Unsteadiness on feet: Secondary | ICD-10-CM | POA: Diagnosis not present

## 2015-03-29 DIAGNOSIS — M6281 Muscle weakness (generalized): Secondary | ICD-10-CM | POA: Diagnosis not present

## 2015-04-04 DIAGNOSIS — M6281 Muscle weakness (generalized): Secondary | ICD-10-CM | POA: Diagnosis not present

## 2015-04-04 DIAGNOSIS — R2681 Unsteadiness on feet: Secondary | ICD-10-CM | POA: Diagnosis not present

## 2015-04-05 DIAGNOSIS — M6281 Muscle weakness (generalized): Secondary | ICD-10-CM | POA: Diagnosis not present

## 2015-04-05 DIAGNOSIS — R2681 Unsteadiness on feet: Secondary | ICD-10-CM | POA: Diagnosis not present

## 2015-04-07 DIAGNOSIS — M6281 Muscle weakness (generalized): Secondary | ICD-10-CM | POA: Diagnosis not present

## 2015-04-07 DIAGNOSIS — R2681 Unsteadiness on feet: Secondary | ICD-10-CM | POA: Diagnosis not present

## 2015-04-08 ENCOUNTER — Ambulatory Visit (INDEPENDENT_AMBULATORY_CARE_PROVIDER_SITE_OTHER): Payer: Medicare Other | Admitting: Family Medicine

## 2015-04-08 ENCOUNTER — Encounter: Payer: Self-pay | Admitting: Family Medicine

## 2015-04-08 VITALS — BP 130/80 | HR 74 | Temp 98.1°F | Wt 220.0 lb

## 2015-04-08 DIAGNOSIS — F329 Major depressive disorder, single episode, unspecified: Secondary | ICD-10-CM | POA: Diagnosis not present

## 2015-04-08 DIAGNOSIS — E119 Type 2 diabetes mellitus without complications: Secondary | ICD-10-CM

## 2015-04-08 DIAGNOSIS — F32A Depression, unspecified: Secondary | ICD-10-CM

## 2015-04-08 LAB — MICROALBUMIN / CREATININE URINE RATIO
CREATININE, U: 107.7 mg/dL
MICROALB UR: 1.5 mg/dL (ref 0.0–1.9)
Microalb Creat Ratio: 1.4 mg/g (ref 0.0–30.0)

## 2015-04-08 MED ORDER — METFORMIN HCL 500 MG PO TABS: 500.0000 mg | ORAL_TABLET | Freq: Every day | ORAL | Status: DC

## 2015-04-08 NOTE — Assessment & Plan Note (Signed)
S: Has not used Xanax since last visit as advised. Continues Prozac. They symptoms reasonably well controlled. A/P: Reasonable control, continue Prozac 3 times a week may increase frequency if needed

## 2015-04-08 NOTE — Progress Notes (Signed)
[name], MD Phone: [phone]  Subjective:   [name] is a 81 y.o. year old very pleasant female patient who presents with the following:  DIABETES Type II-mild poor control Lab Results  Component Value Date   HGBA1C 7.7* 03/24/2015   HGBA1C 7.1* 01/02/2014  Medications taking and tolerating-none prior to visit  Blood Sugars per patient-does not check Diet-has reduce sugary drinks and exerts Regular Exercise-yes On Aspirin-Plavix instead On statin- intolerant Daily foot monitoring-advised  ROS- Denies Polyuria,Polydipsia, nocturia, Vision changes, feet or hand numbness/pain/tingling. Denies  Hypoglycemia symptoms (shaky, sweaty, hungry, weak anxious, tremor, palpitations, confusion, behavior change). No SI HI  Past Medical History- Patient Active Problem List   Diagnosis Date Noted  . Atrial fibrillation 01/08/2014    Priority: High  . Acute diastolic CHF (congestive heart failure) 01/02/2014    Priority: High  . Diabetes mellitus type II, controlled 01/02/2014    Priority: High  . Coronary artery disease     Priority: High  . Depression 03/25/2015    Priority: Medium  . Carotid artery stenosis 04/01/2012    Priority: Medium  . HTN (hypertension) 08/29/2011    Priority: Medium  . Hyperlipidemia 07/18/2011    Priority: Medium  . Long term current use of anticoagulant therapy 01/08/2014    Priority: Low  . Lichen sclerosus     Priority: Low  . Obesities, morbid 07/18/2011    Priority: Low  . GERD (gastroesophageal reflux disease) 03/25/2015  . Spinal stenosis    Medications- reviewed and updated Current Outpatient Prescriptions  Medication Sig Dispense Refill  . clopidogrel (PLAVIX) 75 MG tablet TAKE 1 TABLET (75 MG TOTAL) BY MOUTH DAILY WITH BREAKFAST. 30 tablet 9  . fish oil-omega-3 fatty acids 1000 MG capsule Take 1 g by mouth daily.     [name] FLUoxetine (PROZAC) 20 MG tablet Take 20 mg by mouth 3 (three) times a week.     . losartan (COZAAR) 50  MG tablet Take 1 tablet (50 mg total) by mouth 2 (two) times daily. 60 tablet 3  . metoprolol succinate (TOPROL-XL) 25 MG 24 hr tablet TAKE 1 TABLET(S) BY MOUTH DAILY 30 tablet 6  . omeprazole (PRILOSEC OTC) 20 MG tablet Take 20 mg by mouth as needed.     . warfarin (COUMADIN) 5 MG tablet TAKE 1 TABLET (5 MG TOTAL) BY MOUTH DAILY. 30 tablet 0  . nitroGLYCERIN (NITROSTAT) 0.4 MG SL tablet Place 1 tablet (0.4 mg total) under the tongue every 5 (five) minutes as needed. For chest pain (Patient not taking: Reported on 04/08/2015) 25 tablet 2   Objective: BP 130/80 mmHg  Pulse 74  Temp(Src) 98.1 F (36.7 C)  Wt 220 lb (99.791 kg) Gen: NAD, resting comfortably  CV: irregularly irregular Lungs: CTAB no crackles, wheeze, rhonchi Abdomen: soft/nontender/nondistended/normal bowel sounds.  Ext: 1+ pitting edema Skin: warm, dry Neuro: grossly normal, moves all extremities Diabetic Foot Exam - Simple   Simple Foot Form  Diabetic Foot exam was performed with the following findings:  Yes 04/08/2015 10:16 AM  Visual Inspection  No deformities, no ulcerations, no other skin breakdown bilaterally:  Yes  Sensation Testing  Intact to touch and monofilament testing bilaterally:  Yes  Pulse Check  Posterior Tibialis and Dorsalis pulse intact bilaterally:  Yes  Comments     Assessment/Plan:  Depression S: Has not used Xanax since last visit as advised. Continues Prozac. They symptoms reasonably well controlled. A/P: Reasonable control, continue Prozac 3 times a week may increase frequency  if needed  Diabetes mellitus type II, mildly poor controlled Diagnosis is new to patient. Extensive counseling provided today. We ultimately decided to start metformin 500 mg as well as continue lifestyle changes. See after visit summary. Follow up in 3-4 months with an A1c before visit   3 to four-month follow-up  Meds ordered this encounter  Medications  . metFORMIN (GLUCOPHAGE) 500 MG tablet    Sig: Take 1  tablet (500 mg total) by mouth daily with breakfast.    Dispense:  30 tablet    Refill:  5

## 2015-04-08 NOTE — Assessment & Plan Note (Signed)
Diagnosis is new to patient. Extensive counseling provided today. We ultimately decided to start metformin 500 mg as well as continue lifestyle changes. See after visit summary. Follow up in 3-4 months with an A1c before visit

## 2015-04-08 NOTE — Patient Instructions (Addendum)
Diabetes -continue exercise -avoid sugar sweetened beverages (max once a week sweet tea, soda, juice -start low dose metformin 500mg  in the morning  Health Maintenance   Topic Date Due  . FOOT EXAM -completed today and largely normal 07/18/1943  . OPHTHALMOLOGY EXAM - Have eye exam faxed to [place] at [phone] 07/18/1943  . URINE MICROALBUMIN - today 07[birth year]18/1945  . COLONOSCOPY - trying to find records of this 07/18/1985   You may find this book helpful for your diabetes. It is available on [place] and is fairly inexpensive.  Dr. Janene Harvey Program for Reversing Diabetes: The Scientifically Proven System for Reversing Diabetes without Drugs  http://www.nealbarn[facility].org/index.cfm

## 2015-04-11 ENCOUNTER — Ambulatory Visit (INDEPENDENT_AMBULATORY_CARE_PROVIDER_SITE_OTHER): Payer: Medicare Other | Admitting: Internal Medicine

## 2015-04-11 DIAGNOSIS — I4891 Unspecified atrial fibrillation: Secondary | ICD-10-CM

## 2015-04-11 DIAGNOSIS — R2681 Unsteadiness on feet: Secondary | ICD-10-CM | POA: Diagnosis not present

## 2015-04-11 DIAGNOSIS — M6281 Muscle weakness (generalized): Secondary | ICD-10-CM | POA: Diagnosis not present

## 2015-04-11 LAB — PROTIME-INR: INR: 2 — AB (ref 0.9–1.1)

## 2015-04-15 ENCOUNTER — Ambulatory Visit: Payer: Medicare Other | Admitting: Family Medicine

## 2015-04-15 DIAGNOSIS — R2681 Unsteadiness on feet: Secondary | ICD-10-CM | POA: Diagnosis not present

## 2015-04-15 DIAGNOSIS — M6281 Muscle weakness (generalized): Secondary | ICD-10-CM | POA: Diagnosis not present

## 2015-04-18 DIAGNOSIS — M6281 Muscle weakness (generalized): Secondary | ICD-10-CM | POA: Diagnosis not present

## 2015-04-18 DIAGNOSIS — R2681 Unsteadiness on feet: Secondary | ICD-10-CM | POA: Diagnosis not present

## 2015-04-20 DIAGNOSIS — R2681 Unsteadiness on feet: Secondary | ICD-10-CM | POA: Diagnosis not present

## 2015-04-20 DIAGNOSIS — M6281 Muscle weakness (generalized): Secondary | ICD-10-CM | POA: Diagnosis not present

## 2015-04-22 DIAGNOSIS — R2681 Unsteadiness on feet: Secondary | ICD-10-CM | POA: Diagnosis not present

## 2015-04-22 DIAGNOSIS — M6281 Muscle weakness (generalized): Secondary | ICD-10-CM | POA: Diagnosis not present

## 2015-04-25 ENCOUNTER — Encounter: Payer: Self-pay | Admitting: Family Medicine

## 2015-04-26 ENCOUNTER — Other Ambulatory Visit (HOSPITAL_COMMUNITY): Payer: Medicare Other

## 2015-04-26 ENCOUNTER — Ambulatory Visit: Payer: Medicare Other | Admitting: Family

## 2015-04-27 DIAGNOSIS — R2681 Unsteadiness on feet: Secondary | ICD-10-CM | POA: Diagnosis not present

## 2015-04-27 DIAGNOSIS — M6281 Muscle weakness (generalized): Secondary | ICD-10-CM | POA: Diagnosis not present

## 2015-04-28 ENCOUNTER — Encounter: Payer: Self-pay | Admitting: Family

## 2015-04-29 ENCOUNTER — Other Ambulatory Visit (HOSPITAL_COMMUNITY): Payer: Medicare Other

## 2015-04-29 ENCOUNTER — Ambulatory Visit: Payer: Medicare Other | Admitting: Family

## 2015-05-02 DIAGNOSIS — R2681 Unsteadiness on feet: Secondary | ICD-10-CM | POA: Diagnosis not present

## 2015-05-02 DIAGNOSIS — M6281 Muscle weakness (generalized): Secondary | ICD-10-CM | POA: Diagnosis not present

## 2015-05-03 ENCOUNTER — Encounter: Payer: Self-pay | Admitting: Family Medicine

## 2015-05-03 ENCOUNTER — Ambulatory Visit (INDEPENDENT_AMBULATORY_CARE_PROVIDER_SITE_OTHER): Payer: Medicare Other | Admitting: Family Medicine

## 2015-05-03 VITALS — BP 140/80 | HR 77 | Temp 98.1°F | Wt 222.0 lb

## 2015-05-03 DIAGNOSIS — R21 Rash and other nonspecific skin eruption: Secondary | ICD-10-CM

## 2015-05-03 MED ORDER — TRIAMCINOLONE ACETONIDE 0.1 % EX CREA: 1.0000 "application " | TOPICAL_CREAM | Freq: Two times a day (BID) | CUTANEOUS | Status: DC

## 2015-05-03 NOTE — Patient Instructions (Addendum)
Get eye exam scheduled and have results faxed to [place] at [phone].    For your rash  Could be allergy to ammonium lactate or aveeno  Stop all lotions except the one I give you  Use triamcinolone twice a day for 10 days. May stop early if it resolves completely. Do not use more than twice a day  If you get over this, can use plain vaseline for dry skin  You should see me if this gets worse or you can jus tkeep your dermatology appointment

## 2015-05-03 NOTE — Progress Notes (Signed)
[name], MD  Subjective:  [name] is a 81 y.o. year old very pleasant female patient who presents with:   Rash About a month ago started on ammonium lactate per podiatrist. Has also used aveeno in this month as well. Metformin was started after rash started. Just thought she had dry skin and states she has been putting "heaps" of the ammonium lactate and aveeno on. PT had mentioned concern of allergic reaction. Area itches primarily but some mild tenderness as well. Nothing has made symptoms better. THey continue to worsen as she uses the lotion  ROS-not ill appearing, no fever/chills. No new medications. Not immunocompromised. No mucus membrane involvement. No redness expanding up legs. No redness not in areas that she uses lotion.   Past Medical History- CAD, CHF, DM, a fib  Medications- reviewed and updated Current Outpatient Prescriptions  Medication Sig Dispense Refill  . clopidogrel (PLAVIX) 75 MG tablet TAKE 1 TABLET (75 MG TOTAL) BY MOUTH DAILY WITH BREAKFAST. 30 tablet 9  . fish oil-omega-3 fatty acids 1000 MG capsule Take 1 g by mouth daily.     [name] FLUoxetine (PROZAC) 20 MG tablet Take 20 mg by mouth 3 (three) times a week.     . losartan (COZAAR) 50 MG tablet Take 1 tablet (50 mg total) by mouth 2 (two) times daily. 60 tablet 3  . metFORMIN (GLUCOPHAGE) 500 MG tablet Take 1 tablet (500 mg total) by mouth daily with breakfast. 30 tablet 5  . metoprolol succinate (TOPROL-XL) 25 MG 24 hr tablet TAKE 1 TABLET(S) BY MOUTH DAILY 30 tablet 6  . omeprazole (PRILOSEC OTC) 20 MG tablet Take 20 mg by mouth as needed.     . warfarin (COUMADIN) 5 MG tablet TAKE 1 TABLET (5 MG TOTAL) BY MOUTH DAILY. 30 tablet 0  . nitroGLYCERIN (NITROSTAT) 0.4 MG SL tablet Place 1 tablet (0.4 mg total) under the tongue every 5 (five) minutes as needed. For chest pain (Patient not taking: Reported on 05/03/2015) 25 tablet 2   No current facility-administered medications for this visit.     Objective: BP 140/80 mmHg  Pulse 77  Temp(Src) 98.1 F (36.7 C)  Wt 222 lb (100.699 kg) Gen: NAD, resting comfortably CV: irregularly irregular no murmurs rubs or gallops Lungs: CTAB no crackles, wheeze, rhonchi Ext: 1+  Edema perhaps slightly increased Skin: warm, dry Erythematous rash throughout both right and left lower extremities, no weeping, no obvious vesicles. Some Papules. Chronic lesion on left lower shin (unchanged from baseline and eval by derm in past)    Assessment/Plan:  Rash Suspcious for allergy to ammonium lactate as rash only in area where lotion applied. Metformin started after rash so doubt that as cause. Will trial triamcinolone and stop all other lotions including above and aveeno. Follow up instructions discussed. 10 days max of steroid. Has derm appointment scheduled on 05/18/15.   Meds ordered this encounter  Medications  . triamcinolone cream (KENALOG) 0.1 %    Sig: Apply 1 application topically 2 (two) times daily. 10 days maximum    Dispense:  80 g    Refill:  1

## 2015-05-04 DIAGNOSIS — M6281 Muscle weakness (generalized): Secondary | ICD-10-CM | POA: Diagnosis not present

## 2015-05-04 DIAGNOSIS — R2681 Unsteadiness on feet: Secondary | ICD-10-CM | POA: Diagnosis not present

## 2015-05-09 ENCOUNTER — Ambulatory Visit (INDEPENDENT_AMBULATORY_CARE_PROVIDER_SITE_OTHER): Payer: Medicare Other | Admitting: Internal Medicine

## 2015-05-09 DIAGNOSIS — I4891 Unspecified atrial fibrillation: Secondary | ICD-10-CM

## 2015-05-09 DIAGNOSIS — Z7901 Long term (current) use of anticoagulants: Secondary | ICD-10-CM | POA: Diagnosis not present

## 2015-05-09 LAB — PROTIME-INR: INR: 2.4 — AB (ref 0.9–1.1)

## 2015-05-18 DIAGNOSIS — L814 Other melanin hyperpigmentation: Secondary | ICD-10-CM | POA: Diagnosis not present

## 2015-05-18 DIAGNOSIS — I8311 Varicose veins of right lower extremity with inflammation: Secondary | ICD-10-CM | POA: Diagnosis not present

## 2015-05-18 DIAGNOSIS — I8312 Varicose veins of left lower extremity with inflammation: Secondary | ICD-10-CM | POA: Diagnosis not present

## 2015-05-18 DIAGNOSIS — L565 Disseminated superficial actinic porokeratosis (DSAP): Secondary | ICD-10-CM | POA: Diagnosis not present

## 2015-05-18 DIAGNOSIS — I872 Venous insufficiency (chronic) (peripheral): Secondary | ICD-10-CM | POA: Diagnosis not present

## 2015-05-18 DIAGNOSIS — L821 Other seborrheic keratosis: Secondary | ICD-10-CM | POA: Diagnosis not present

## 2015-05-25 ENCOUNTER — Telehealth: Payer: Self-pay | Admitting: *Deleted

## 2015-05-25 NOTE — Telephone Encounter (Signed)
Pt called and left message in triage requesting medication for Lichen Sclerosus. Pt last seen in 2013. I called and left message in pt voicemail that overdue for annual and TF is out of the office. Suggest pt call PCP and get Rx and to schedule with TF

## 2015-05-26 ENCOUNTER — Encounter: Payer: Self-pay | Admitting: Cardiology

## 2015-05-26 ENCOUNTER — Ambulatory Visit (INDEPENDENT_AMBULATORY_CARE_PROVIDER_SITE_OTHER): Payer: Medicare Other | Admitting: Cardiology

## 2015-05-26 VITALS — BP 140/70 | HR 62 | Ht 68.0 in | Wt 220.8 lb

## 2015-05-26 DIAGNOSIS — I6529 Occlusion and stenosis of unspecified carotid artery: Secondary | ICD-10-CM

## 2015-05-26 DIAGNOSIS — I2510 Atherosclerotic heart disease of native coronary artery without angina pectoris: Secondary | ICD-10-CM

## 2015-05-26 DIAGNOSIS — I10 Essential (primary) hypertension: Secondary | ICD-10-CM

## 2015-05-26 DIAGNOSIS — I482 Chronic atrial fibrillation, unspecified: Secondary | ICD-10-CM

## 2015-05-26 DIAGNOSIS — E119 Type 2 diabetes mellitus without complications: Secondary | ICD-10-CM

## 2015-05-26 DIAGNOSIS — Z7901 Long term (current) use of anticoagulants: Secondary | ICD-10-CM

## 2015-05-26 DIAGNOSIS — E785 Hyperlipidemia, unspecified: Secondary | ICD-10-CM

## 2015-05-26 DIAGNOSIS — R600 Localized edema: Secondary | ICD-10-CM

## 2015-05-26 MED ORDER — FUROSEMIDE 20 MG PO TABS: 20.0000 mg | ORAL_TABLET | Freq: Every day | ORAL | Status: DC

## 2015-05-26 MED ORDER — POTASSIUM CHLORIDE ER 10 MEQ PO TBCR: 10.0000 meq | EXTENDED_RELEASE_TABLET | Freq: Every day | ORAL | Status: DC

## 2015-05-26 NOTE — Patient Instructions (Signed)
We will start you on lasix 20 mg daily ( diuretic ) and potassium 10 meq daily  Wear support hose.  Restrict your salt intake  We will need to check your potassium level in 2-3 weeks.
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
## 2015-06-13 ENCOUNTER — Encounter: Payer: Self-pay | Admitting: Family

## 2015-06-15 ENCOUNTER — Ambulatory Visit (INDEPENDENT_AMBULATORY_CARE_PROVIDER_SITE_OTHER): Payer: Medicare Other | Admitting: Family

## 2015-06-15 ENCOUNTER — Other Ambulatory Visit: Payer: Self-pay | Admitting: Family

## 2015-06-15 ENCOUNTER — Ambulatory Visit (HOSPITAL_COMMUNITY)
Admission: RE | Admit: 2015-06-15 | Discharge: 2015-06-15 | Disposition: A | Discharge status: Home / Self Care | Payer: Medicare Other | Source: Ambulatory Visit | Attending: Family | Admitting: Family

## 2015-06-15 ENCOUNTER — Encounter: Payer: Self-pay | Admitting: Family

## 2015-06-15 VITALS — BP 102/62 | HR 60 | Resp 14 | Ht 68.0 in | Wt 214.0 lb

## 2015-06-15 DIAGNOSIS — Z48812 Encounter for surgical aftercare following surgery on the circulatory system: Secondary | ICD-10-CM | POA: Diagnosis not present

## 2015-06-15 DIAGNOSIS — Z9889 Other specified postprocedural states: Secondary | ICD-10-CM | POA: Diagnosis not present

## 2015-06-15 DIAGNOSIS — I6523 Occlusion and stenosis of bilateral carotid arteries: Secondary | ICD-10-CM | POA: Diagnosis not present

## 2015-06-15 NOTE — Patient Instructions (Signed)
Stroke Prevention Some medical conditions and behaviors are associated with an increased chance of having a stroke. You may prevent a stroke by making healthy choices and managing medical conditions. HOW CAN I REDUCE MY RISK OF HAVING A STROKE?   Stay physically active. Get at least 30 minutes of activity on most or all days.  Do not smoke. It may also be helpful to avoid exposure to secondhand smoke.  Limit alcohol use. Moderate alcohol use is considered to be:  No more than 2 drinks per day for men.  No more than 1 drink per day for nonpregnant women.  Eat healthy foods. This involves:  Eating 5 or more servings of fruits and vegetables a day.  Making dietary changes that address high blood pressure (hypertension), high cholesterol, diabetes, or obesity.  Manage your cholesterol levels.  Making food choices that are high in fiber and low in saturated fat, trans fat, and cholesterol may control cholesterol levels.  Take any prescribed medicines to control cholesterol as directed by your health care provider.  Manage your diabetes.  Controlling your carbohydrate and sugar intake is recommended to manage diabetes.  Take any prescribed medicines to control diabetes as directed by your health care provider.  Control your hypertension.  Making food choices that are low in salt (sodium), saturated fat, trans fat, and cholesterol is recommended to manage hypertension.  Take any prescribed medicines to control hypertension as directed by your health care provider.  Maintain a healthy weight.  Reducing calorie intake and making food choices that are low in sodium, saturated fat, trans fat, and cholesterol are recommended to manage weight.  Stop drug abuse.  Avoid taking birth control pills.  Talk to your health care provider about the risks of taking birth control pills if you are over 35 years old, smoke, get migraines, or have ever had a blood clot.  Get evaluated for sleep  disorders (sleep apnea).  Talk to your health care provider about getting a sleep evaluation if you snore a lot or have excessive sleepiness.  Take medicines only as directed by your health care provider.  For some people, aspirin or blood thinners (anticoagulants) are helpful in reducing the risk of forming abnormal blood clots that can lead to stroke. If you have the irregular heart rhythm of atrial fibrillation, you should be on a blood thinner unless there is a good reason you cannot take them.  Understand all your medicine instructions.  Make sure that other conditions (such as anemia or atherosclerosis) are addressed. SEEK IMMEDIATE MEDICAL CARE IF:   You have sudden weakness or numbness of the face, arm, or leg, especially on one side of the body.  Your face or eyelid droops to one side.  You have sudden confusion.  You have trouble speaking (aphasia) or understanding.  You have sudden trouble seeing in one or both eyes.  You have sudden trouble walking.  You have dizziness.  You have a loss of balance or coordination.  You have a sudden, severe headache with no known cause.  You have new chest pain or an irregular heartbeat. Any of these symptoms may represent a serious problem that is an emergency. Do not wait to see if the symptoms will go away. Get medical help at once. Call your local emergency services (911 in [place] Do not drive yourself to the hospital. Document Released: 01/24/2005 Document Revised: 05/03/2014 Document Reviewed: 06/19/2013 ExitCare Patient Information 2015 [employer]. This information is not intended to replace advice given   to you by your health care provider. Make sure you discuss any questions you have with your health care provider.  
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
## 2015-06-16 ENCOUNTER — Other Ambulatory Visit: Payer: Self-pay | Admitting: Cardiology

## 2015-06-16 NOTE — Telephone Encounter (Signed)
Rx(s) sent to pharmacy electronically.  

## 2015-06-17 ENCOUNTER — Encounter: Payer: Self-pay | Admitting: Vascular Surgery

## 2015-06-20 ENCOUNTER — Ambulatory Visit (INDEPENDENT_AMBULATORY_CARE_PROVIDER_SITE_OTHER): Payer: Medicare Other | Admitting: Cardiovascular Disease

## 2015-06-20 DIAGNOSIS — I482 Chronic atrial fibrillation, unspecified: Secondary | ICD-10-CM

## 2015-06-20 DIAGNOSIS — Z7901 Long term (current) use of anticoagulants: Secondary | ICD-10-CM | POA: Diagnosis not present

## 2015-06-20 DIAGNOSIS — I4891 Unspecified atrial fibrillation: Secondary | ICD-10-CM | POA: Diagnosis not present

## 2015-06-20 LAB — PROTIME-INR: INR: 2.1 — AB (ref 0.9–1.1)

## 2015-06-21 ENCOUNTER — Encounter: Payer: Self-pay | Admitting: Vascular Surgery

## 2015-06-21 ENCOUNTER — Ambulatory Visit (INDEPENDENT_AMBULATORY_CARE_PROVIDER_SITE_OTHER): Payer: Medicare Other | Admitting: Vascular Surgery

## 2015-06-21 VITALS — BP 124/54 | HR 60 | Temp 97.9°F | Resp 16 | Ht 68.0 in | Wt 216.0 lb

## 2015-06-21 DIAGNOSIS — I6521 Occlusion and stenosis of right carotid artery: Secondary | ICD-10-CM

## 2015-06-21 DIAGNOSIS — I6523 Occlusion and stenosis of bilateral carotid arteries: Secondary | ICD-10-CM

## 2015-06-21 NOTE — Progress Notes (Signed)
    Established Carotid Patient  History of Present Illness  [name] is a 81 y.o. (07/16/1933) female who presents for continued discussion of her right internal carotid stenosis s/p right carotid endarterectomy 01/02/2006. She last saw Vinnie Level Nickel on 06/15/15 and her duplex studies revealed right ICA stenosis of 80-99%. Her previous duplex in April 2015 revealed no significant stenosis.   Patient has no history of TIA or stroke symptom.  The patient has not had amaurosis fugax or monocular blindness.  The patient has denies facial drooping or hemiplegia.  The patient has never had receptive or expressive aphasia.  She does note one episode of right arm numbness with lifting weights. This quickly resolved and she has not had any other similar episodes.   She was diagnosed with atrial fibrillation in 2014 and is on coumadin. She has never had any complications from her atrial fibrillation.  Her cardiologist is Dr. [name].   Physical Examination  Filed Vitals:   06/21/15 1454 06/21/15 1500  BP: 142/51 124/54  Pulse: 76 60  Temp: 97.9 F (36.6 C)   TempSrc: Oral   Resp: 16   Height: 5\' 8"  (1.727 m)   Weight: 216 lb (97.977 kg)   SpO2: 98%    Body mass index is 32.85 kg/(m^2).  General: A&O x 3, WDWN female in NAD  Neck: Supple  Pulmonary: Sym exp, good air movt, CTAB, no rales, rhonchi, & wheezing  Cardiac: Irregularly, irregular rhythm and rate, right carotid bruit  Vascular: 2+ radial and femoral pulses b/l, feet are warm b/l  Musculoskeletal: M/S 5/5 throughout  Neurologic: CN 2-12 grossly intact  Non-Invasive Vascular Imaging  CAROTID DUPLEX (Date: 06/15/15):   R ICA stenosis: 80-99%  R VA:  patent and antegrade  L ICA stenosis: <40%  L VA:  patent and bidirectional  Medical Decision Making  [name] is a 81 y.o. female who presents with asymptomatic restenosis of right internal carotid artery 80-99% s/p right carotid endarterectomy 01/02/2006. Her  duplex studies one year revealed <40% ICA stenosis bilaterally. It is unclear why her study results have changed so siginificantly. She will need a carotid arteriogram for further evaluation. This will need to be coordinated with Dr. Trula Slade and Dr. Kennon Holter schedule. If she has a lesion amenable to stenting, this will be completed during her angiogram. She is on coumadin for atrial fibrillation. This will be held for 5 days prior to her arteriogram. If carotid stenting is unable to be performed, she will need to be scheduled for redo right carotid endarterectomy at a later date.    Virgina Jock, PA-C [facility] [place] Office: [phone] Pager: [phone]  06/21/2015, 3:27 PM  This patient was seen in conjunction with Dr. Kellie Simmering Agree with above assessment Severe restenosis-80-99% right ICA following endarterectomy in 2007-dramatic change from study done 14 months ago-patient is asymptomatic Discussed risks and benefits of carotid stenting and redo carotid surgery with patient Will start patient on Plavix prior to angiogram and potential stenting Will have to coordinate discontinuing Coumadin Patient understands this and would like to proceed as soon as this can be scheduled

## 2015-06-22 ENCOUNTER — Telehealth: Payer: Self-pay | Admitting: Cardiology

## 2015-06-22 NOTE — Telephone Encounter (Signed)
Returned call to [place] at [facility].She stated patient is scheduled to have Carotid Angiogram possible right carotid stent 07/06/15 by Dr.Brabham,Dr.Berry assisting.Stated patient will need to hold coumadin 5 days prior to surgery and will need to start plavix and aspirin 7 days prior to surgery.Dr.Jordan out of office this week,will speak to him 06/27/15 and call you back.

## 2015-06-22 NOTE — Telephone Encounter (Signed)
[name] is calling about stopping [name] Coumadin and place her on plavix before her right carotid stent  procedure . Her procedure is schedule for 07/06/15 .Please call    Thanks

## 2015-06-27 ENCOUNTER — Other Ambulatory Visit: Payer: Self-pay

## 2015-06-27 MED ORDER — ASPIRIN EC 81 MG PO TBEC: 81.0000 mg | DELAYED_RELEASE_TABLET | Freq: Every day | ORAL | Status: DC

## 2015-06-27 MED ORDER — CLOPIDOGREL BISULFATE 75 MG PO TABS: 75.0000 mg | ORAL_TABLET | Freq: Every day | ORAL | Status: DC

## 2015-06-27 NOTE — Telephone Encounter (Signed)
Returned call to [place] with [facility].Spoke to Dr.Jordan he advised ok to stop coumadin 7 days prior to carotid angiogram.Ok to start plavix 75 mg daily and aspirin 81 mg daily 7 days prior to procedure.Advised I will call patient and let her know.  Patient called no answer.[place].

## 2015-06-27 NOTE — Telephone Encounter (Signed)
Patient called no answer.Left message on personal answering machine spoke to Dr.Jordan he advised ok to stop coumadin last dose will be on 06/28/15.On 06/29/15 start plavix 75 mg daily and aspirin 81 mg daily 7 days prior to carotid angiogram.Prescriptions sent to your pharmacy.

## 2015-06-27 NOTE — Telephone Encounter (Signed)
[name] is calling back to check the status of the patient's clearance for a Carotid stent that needed to be placed by Dr. Trula Slade. Please f/u with [name].  Thanks

## 2015-06-30 DIAGNOSIS — I8312 Varicose veins of left lower extremity with inflammation: Secondary | ICD-10-CM | POA: Diagnosis not present

## 2015-06-30 DIAGNOSIS — L814 Other melanin hyperpigmentation: Secondary | ICD-10-CM | POA: Diagnosis not present

## 2015-06-30 DIAGNOSIS — I8311 Varicose veins of right lower extremity with inflammation: Secondary | ICD-10-CM | POA: Diagnosis not present

## 2015-06-30 DIAGNOSIS — L570 Actinic keratosis: Secondary | ICD-10-CM | POA: Diagnosis not present

## 2015-06-30 DIAGNOSIS — I872 Venous insufficiency (chronic) (peripheral): Secondary | ICD-10-CM | POA: Diagnosis not present

## 2015-07-01 ENCOUNTER — Ambulatory Visit (INDEPENDENT_AMBULATORY_CARE_PROVIDER_SITE_OTHER): Payer: Medicare Other | Admitting: Podiatry

## 2015-07-01 ENCOUNTER — Ambulatory Visit: Payer: Private Health Insurance - Indemnity | Admitting: Podiatry

## 2015-07-01 DIAGNOSIS — M79676 Pain in unspecified toe(s): Secondary | ICD-10-CM | POA: Diagnosis not present

## 2015-07-01 DIAGNOSIS — B351 Tinea unguium: Secondary | ICD-10-CM

## 2015-07-01 DIAGNOSIS — M79675 Pain in left toe(s): Secondary | ICD-10-CM

## 2015-07-01 DIAGNOSIS — M79674 Pain in right toe(s): Secondary | ICD-10-CM

## 2015-07-01 NOTE — Progress Notes (Signed)
Subjective:     Patient ID: [name], female   DOB: [birth year], 81 y.o.   MRN: [id number]  HPIThis patient presents to the office for her regular preventive foot care services.  She says she is now diabetic.  Her nails are painful walking and wearing her shoes.  She also has painful corns fifth toes both feet and corn on tip third toe left foot.   Review of Systems     Objective:   Physical Exam Objective: Review of past medical history, medications, social history and allergies were performed.  Vascular: Dorsalis pedis and posterior tibial pulses were palpable B/L, capillary refill was  WNL B/L, temperature gradient was WNL B/L   Skin:  No signs of symptoms of infection or ulcers on both feet.  HD fifth toes both feet.  Distal clavi third toe left foot.  Nails: thick disfigured discolored nails with pain upon palpation.  Sensory: Thornell Mule monifilament WNL   Orthopedic: Orthopedic evaluation demonstrates all joints distal t ankle have full ROM without crepitus, muscle power WNL B/L    Assessment:     Onychomycosis     Plan:     Debridement of her nails B/L

## 2015-07-01 NOTE — Progress Notes (Signed)
Patient ID: [name], female   DOB: 1933/04/28, 81 y.o.   MRN: [id number] I just found out I am diabetic and I need my routine nail care

## 2015-07-06 ENCOUNTER — Encounter (HOSPITAL_COMMUNITY): Payer: Self-pay | Admitting: Surgery

## 2015-07-06 ENCOUNTER — Other Ambulatory Visit: Payer: Self-pay | Admitting: *Deleted

## 2015-07-06 ENCOUNTER — Telehealth: Payer: Self-pay | Admitting: Vascular Surgery

## 2015-07-06 ENCOUNTER — Encounter (HOSPITAL_COMMUNITY)
Admission: RE | Disposition: A | Discharge status: Home / Self Care | Payer: Self-pay | Source: Ambulatory Visit | Attending: Surgery

## 2015-07-06 ENCOUNTER — Inpatient Hospital Stay (HOSPITAL_COMMUNITY)
Admission: RE | Admit: 2015-07-06 | Discharge: 2015-07-07 | DRG: 036 | Disposition: A | Discharge status: Home / Self Care | Payer: Medicare Other | Source: Ambulatory Visit | Attending: Surgery | Admitting: Surgery

## 2015-07-06 DIAGNOSIS — E119 Type 2 diabetes mellitus without complications: Secondary | ICD-10-CM | POA: Diagnosis present

## 2015-07-06 DIAGNOSIS — Z959 Presence of cardiac and vascular implant and graft, unspecified: Secondary | ICD-10-CM

## 2015-07-06 DIAGNOSIS — Z955 Presence of coronary angioplasty implant and graft: Secondary | ICD-10-CM | POA: Diagnosis not present

## 2015-07-06 DIAGNOSIS — Z6834 Body mass index (BMI) 34.0-34.9, adult: Secondary | ICD-10-CM | POA: Diagnosis not present

## 2015-07-06 DIAGNOSIS — I4891 Unspecified atrial fibrillation: Secondary | ICD-10-CM | POA: Diagnosis present

## 2015-07-06 DIAGNOSIS — F329 Major depressive disorder, single episode, unspecified: Secondary | ICD-10-CM | POA: Diagnosis present

## 2015-07-06 DIAGNOSIS — Z7901 Long term (current) use of anticoagulants: Secondary | ICD-10-CM

## 2015-07-06 DIAGNOSIS — K219 Gastro-esophageal reflux disease without esophagitis: Secondary | ICD-10-CM | POA: Diagnosis present

## 2015-07-06 DIAGNOSIS — I10 Essential (primary) hypertension: Secondary | ICD-10-CM | POA: Diagnosis present

## 2015-07-06 DIAGNOSIS — E785 Hyperlipidemia, unspecified: Secondary | ICD-10-CM | POA: Diagnosis present

## 2015-07-06 DIAGNOSIS — I6521 Occlusion and stenosis of right carotid artery: Secondary | ICD-10-CM

## 2015-07-06 DIAGNOSIS — I2510 Atherosclerotic heart disease of native coronary artery without angina pectoris: Secondary | ICD-10-CM | POA: Diagnosis present

## 2015-07-06 DIAGNOSIS — I6529 Occlusion and stenosis of unspecified carotid artery: Secondary | ICD-10-CM | POA: Diagnosis present

## 2015-07-06 DIAGNOSIS — Z951 Presence of aortocoronary bypass graft: Secondary | ICD-10-CM | POA: Diagnosis not present

## 2015-07-06 HISTORY — PX: PERIPHERAL VASCULAR CATHETERIZATION: SHX172C

## 2015-07-06 HISTORY — DX: Type 2 diabetes mellitus without complications: E11.9

## 2015-07-06 HISTORY — DX: Angina pectoris, unspecified: I20.9

## 2015-07-06 HISTORY — DX: Unspecified osteoarthritis, unspecified site: M19.90

## 2015-07-06 LAB — POCT I-STAT, CHEM 8
BUN: 20 mg/dL (ref 6–20)
CALCIUM ION: 1.22 mmol/L (ref 1.13–1.30)
CHLORIDE: 103 mmol/L (ref 101–111)
CREATININE: 0.8 mg/dL (ref 0.44–1.00)
GLUCOSE: 140 mg/dL — AB (ref 65–99)
HCT: 38 % (ref 36.0–46.0)
Hemoglobin: 12.9 g/dL (ref 12.0–15.0)
Potassium: 4.2 mmol/L (ref 3.5–5.1)
Sodium: 136 mmol/L (ref 135–145)
TCO2: 21 mmol/L (ref 0–100)

## 2015-07-06 LAB — GLUCOSE, CAPILLARY
Glucose-Capillary: 138 mg/dL — ABNORMAL HIGH (ref 65–99)
Glucose-Capillary: 134 mg/dL — ABNORMAL HIGH (ref 65–99)
Glucose-Capillary: 172 mg/dL — ABNORMAL HIGH (ref 65–99)
Glucose-Capillary: 124 mg/dL — ABNORMAL HIGH (ref 65–99)

## 2015-07-06 LAB — MRSA PCR SCREENING: MRSA by PCR: NEGATIVE

## 2015-07-06 LAB — POCT I-STAT 4, (NA,K, GLUC, HGB,HCT)
Glucose, Bld: 139 mg/dL — ABNORMAL HIGH (ref 65–99)
HCT: 38 % (ref 36.0–46.0)
Hemoglobin: 12.9 g/dL (ref 12.0–15.0)
Potassium: 4.3 mmol/L (ref 3.5–5.1)
SODIUM: 137 mmol/L (ref 135–145)

## 2015-07-06 LAB — POCT ACTIVATED CLOTTING TIME: Activated Clotting Time: 417 seconds

## 2015-07-06 LAB — PROTIME-INR
INR: 1.12 (ref 0.00–1.49)
PROTHROMBIN TIME: 14.6 s (ref 11.6–15.2)

## 2015-07-06 SURGERY — CAROTID ANGIOGRAPHY

## 2015-07-06 SURGERY — CAROTID ANGIOGRAPHY
Anesthesia: LOCAL

## 2015-07-06 MED ORDER — HEPARIN (PORCINE) IN NACL 2-0.9 UNIT/ML-% IJ SOLN
INTRAMUSCULAR | Status: AC
  Filled 2015-07-06: qty 1000

## 2015-07-06 MED ORDER — NOREPINEPHRINE BITARTRATE 1 MG/ML IV SOLN
INTRAVENOUS | Status: AC
  Filled 2015-07-06: qty 4

## 2015-07-06 MED ORDER — METOPROLOL TARTRATE 1 MG/ML IV SOLN: 2.0000 mg | INTRAVENOUS | Status: DC | PRN

## 2015-07-06 MED ORDER — SODIUM CHLORIDE 0.9 % IV SOLN
INTRAVENOUS | Status: DC
  Administered 2015-07-06: 07:00:00 via INTRAVENOUS

## 2015-07-06 MED ORDER — ONDANSETRON HCL 4 MG/2ML IJ SOLN: 4.0000 mg | Freq: Four times a day (QID) | INTRAMUSCULAR | Status: DC | PRN

## 2015-07-06 MED ORDER — CLOPIDOGREL BISULFATE 75 MG PO TABS
ORAL_TABLET | ORAL | Status: AC
  Administered 2015-07-06: 75 mg
  Filled 2015-07-06: qty 1

## 2015-07-06 MED ORDER — CLOPIDOGREL BISULFATE 75 MG PO TABS: 75.0000 mg | ORAL_TABLET | Freq: Once | ORAL | Status: DC

## 2015-07-06 MED ORDER — LIDOCAINE HCL (PF) 1 % IJ SOLN
INTRAMUSCULAR | Status: DC | PRN
  Administered 2015-07-06: 15 mL via SUBCUTANEOUS

## 2015-07-06 MED ORDER — SODIUM CHLORIDE 0.9 % IV SOLN: INTRAVENOUS | Status: DC

## 2015-07-06 MED ORDER — IODIXANOL 320 MG/ML IV SOLN
INTRAVENOUS | Status: DC | PRN
  Administered 2015-07-06: 148 mL via INTRA_ARTERIAL

## 2015-07-06 MED ORDER — ACETAMINOPHEN 650 MG RE SUPP: 325.0000 mg | RECTAL | Status: DC | PRN

## 2015-07-06 MED ORDER — HYDRALAZINE HCL 20 MG/ML IJ SOLN: 5.0000 mg | INTRAMUSCULAR | Status: DC | PRN

## 2015-07-06 MED ORDER — LABETALOL HCL 5 MG/ML IV SOLN: 10.0000 mg | INTRAVENOUS | Status: DC | PRN

## 2015-07-06 MED ORDER — DOCUSATE SODIUM 100 MG PO CAPS: 100.0000 mg | ORAL_CAPSULE | Freq: Every day | ORAL | Status: DC

## 2015-07-06 MED ORDER — ALUM & MAG HYDROXIDE-SIMETH 200-200-20 MG/5ML PO SUSP: 15.0000 mL | ORAL | Status: DC | PRN

## 2015-07-06 MED ORDER — SODIUM CHLORIDE 0.9 % IV SOLN
250.0000 mg | INTRAVENOUS | Status: DC | PRN
  Administered 2015-07-06: 1.75 mg/kg/h via INTRAVENOUS

## 2015-07-06 MED ORDER — ACETAMINOPHEN 325 MG PO TABS: 325.0000 mg | ORAL_TABLET | ORAL | Status: DC | PRN

## 2015-07-06 MED ORDER — ASPIRIN 81 MG PO CHEW: 81.0000 mg | CHEWABLE_TABLET | Freq: Once | ORAL | Status: DC

## 2015-07-06 MED ORDER — ATROPINE SULFATE 0.1 MG/ML IJ SOLN
INTRAMUSCULAR | Status: AC
  Filled 2015-07-06: qty 10

## 2015-07-06 MED ORDER — BIVALIRUDIN 250 MG IV SOLR
INTRAVENOUS | Status: AC
  Filled 2015-07-06: qty 250

## 2015-07-06 MED ORDER — PHENOL 1.4 % MT LIQD: 1.0000 | OROMUCOSAL | Status: DC | PRN

## 2015-07-06 MED ORDER — LIDOCAINE HCL (PF) 1 % IJ SOLN
INTRAMUSCULAR | Status: AC
  Filled 2015-07-06: qty 30

## 2015-07-06 MED ORDER — OXYCODONE HCL 5 MG PO TABS: 5.0000 mg | ORAL_TABLET | ORAL | Status: DC | PRN

## 2015-07-06 MED ORDER — ASPIRIN 81 MG PO CHEW
CHEWABLE_TABLET | ORAL | Status: AC
  Administered 2015-07-06: 81 mg
  Filled 2015-07-06: qty 1

## 2015-07-06 MED ORDER — GUAIFENESIN-DM 100-10 MG/5ML PO SYRP: 15.0000 mL | ORAL_SOLUTION | ORAL | Status: DC | PRN

## 2015-07-06 MED ORDER — BIVALIRUDIN BOLUS VIA INFUSION - CUPID
INTRAVENOUS | Status: DC | PRN
Start: 2015-07-06 — End: 2015-07-06
  Administered 2015-07-06: 166.775 mg via INTRAVENOUS
  Administered 2015-07-06: 71.475 mg via INTRAVENOUS

## 2015-07-06 SURGICAL SUPPLY — 20 items
BALLN EMERGE MR 3.0X20 (BALLOONS) ×2
BALLN VIATRAC 5X20X135 (BALLOONS) ×2
CATH ANGIO 5F BER2 100CM (CATHETERS) ×2
CATH SHUTTLE SLIPCATH JB1 6.5F (CATHETERS) ×2
COVER PRB 48X5XTLSCP FOLD TPE (BAG) ×1
COVER PROBE 5X48 (BAG) ×1
DEVICE CONTINUOUS FLUSH (MISCELLANEOUS) ×2
DEVICE EMBOSHIELD NAV6 4.0-7.0 (WIRE) ×2
KIT ENCORE 26 ADVANTAGE (KITS) ×2
KIT MICROINTRODUCER STIFF 5F (SHEATH) ×2
KIT PV (KITS) ×2
SHEATH PINNACLE 6F 10CM (SHEATH) ×2
SHEATH SHUTTLE SELECT 6F (SHEATH) ×2
SHIELD RADPAD SCOOP 12X17 (MISCELLANEOUS) ×2
STENT XACT CAR 10-8X30X136 (Permanent Stent) ×2 IMPLANT
SYR MEDRAD MARK V 150ML (SYRINGE) ×2
TRANSDUCER W/STOPCOCK (MISCELLANEOUS) ×2
TRAY PV CATH (CUSTOM PROCEDURE TRAY) ×2
WIRE AMPLATZ SSTIFF .035X260CM (WIRE) ×2
WIRE BENTSON .035X145CM (WIRE) ×2

## 2015-07-06 NOTE — Progress Notes (Signed)
Notified Laswon ( physician on call ) of groin site rebleed. Advised of nursing and cath lab interventions. Informed him of cath lab recs and Dr. Kellie Simmering agreed with extended vascular site precautions from 1730-2330.

## 2015-07-06 NOTE — Progress Notes (Signed)
Patient admitted from cath lab via bed at ~1430 with RN and transported on cardiac monitor. Patient alert and oriented x4, follows simple commands, denies complaints of pain. Denies issues with circulation, numbness, or tingling. Assessed groin site with cath lab RN. See echarting for additional data  Action- reviewed plan of care with patient, oriented patient to room, reviewed vascular precautions. Set bed in lowest position, locked head and foot bed positioning functions.  Response- patient acknowledged understanding of precautions, plan of care, and use of call bell. Stable and Right groin site and dressing  soft and CDI

## 2015-07-06 NOTE — Progress Notes (Signed)
[name] had a rebleed to her rt groin. Tammy Jelene Albano and Garen Lah sent to help with hemostasis. Pt was alert and comfortable. Pt had a small ridge hematoma and bruising to rt groin upon arrival. Manual pressure applied for 30 min by [place] and Tammy. Pressure dressing applied and pt was instructed. Pt was alert and oriented during hold. RT groin was soft and bruised upon hemostasis.Rt dp/pt 2+. Nurse was instructed for bed rest to restart at 530pm for an additional 4hrs. Smokey called nurse and instructed her to increase bedrest to 6hrs.

## 2015-07-06 NOTE — Progress Notes (Signed)
Vascular precaution ended at 1630 and assisted to edge of bed

## 2015-07-06 NOTE — H&P (View-Only) (Signed)
    Established Carotid Patient  History of Present Illness  [name] is a 81 y.o. (09-03-1933) female who presents for continued discussion of her right internal carotid stenosis s/p right carotid endarterectomy 01/02/2006. She last saw V[facility] Level Nickel on 06/15/15 and her duplex studies revealed right ICA stenosis of 80-99%. Her previous duplex in April 2015 revealed no significant stenosis.   Patient has no history of TIA or stroke symptom.  The patient has not had amaurosis fugax or monocular blindness.  The patient has denies facial drooping or hemiplegia.  The patient has never had receptive or expressive aphasia.  She does note one episode of right arm numbness with lifting weights. This quickly resolved and she has not had any other similar episodes.   She was diagnosed with atrial fibrillation in 2014 and is on coumadin. She has never had any complications from her atrial fibrillation.  Her cardiologist is Dr. [name].   Physical Examination  Filed Vitals:   06/21/15 1454 06/21/15 1500  BP: 142/51 124/54  Pulse: 76 60  Temp: 97.9 F (36.6 C)   TempSrc: Oral   Resp: 16   Height: 5\' 8"  (1.727 m)   Weight: 216 lb (97.977 kg)   SpO2: 98%    Body mass index is 32.85 kg/(m^2).  General: A&O x 3, WDWN female in NAD  Neck: Supple  Pulmonary: Sym exp, good air movt, CTAB, no rales, rhonchi, & wheezing  Cardiac: Irregularly, irregular rhythm and rate, right carotid bruit  Vascular: 2+ radial and femoral pulses b/l, feet are warm b/l  Musculoskeletal: M/S 5/5 throughout  Neurologic: CN 2-12 grossly intact  Non-Invasive Vascular Imaging  CAROTID DUPLEX (Date: 06/15/15):   R ICA stenosis: 80-99%  R VA:  patent and antegrade  L ICA stenosis: <40%  L VA:  patent and bidirectional  Medical Decision Making  [name] is a 81 y.o. female who presents with asymptomatic restenosis of right internal carotid artery 80-99% s/p right carotid endarterectomy 01/02/2006. Her  duplex studies one year revealed <40% ICA stenosis bilaterally. It is unclear why her study results have changed so siginificantly. She will need a carotid arteriogram for further evaluation. This will need to be coordinated with Dr. Trula Slade and Dr. Kennon Holter schedule. If she has a lesion amenable to stenting, this will be completed during her angiogram. She is on coumadin for atrial fibrillation. This will be held for 5 days prior to her arteriogram. If carotid stenting is unable to be performed, she will need to be scheduled for redo right carotid endarterectomy at a later date.    Virgina Jock, PA-C [facility] Office: [phone] Pager: [phone]  06/21/2015, 3:27 PM  This patient was seen in conjunction with Dr. Kellie Simmering Agree with above assessment Severe restenosis-80-99% right ICA following endarterectomy in 2007-dramatic change from study done 14 months ago-patient is asymptomatic Discussed risks and benefits of carotid stenting and redo carotid surgery with patient Will start patient on Plavix prior to angiogram and potential stenting Will have to coordinate discontinuing Coumadin Patient understands this and would like to proceed as soon as this can be scheduled

## 2015-07-06 NOTE — Interval H&P Note (Signed)
History and Physical Interval Note:  07/06/2015 8:28 AM  [name]  has presented today for surgery, with the diagnosis of carotid stenosis  The various methods of treatment have been discussed with the patient and family. After consideration of risks, benefits and other options for treatment, the patient has consented to  Procedure(s): Carotid Angiography (N/A) Carotid PTA/Stent Intervention (N/A) as a surgical intervention .  The patient's history has been reviewed, patient examined, no change in status, stable for surgery.  I have reviewed the patient's chart and labs.  Questions were answered to the patient's satisfaction.     [name] Remains asymptomatic  WB

## 2015-07-06 NOTE — Telephone Encounter (Addendum)
-----   Message from Mena Goes, RN sent at 07/06/2015 10:01 AM EDT ----- Regarding: Schedule   ----- Message -----    From: Serafina Mitchell, MD    Sent: 07/06/2015   9:50 AM      To: [facility] Charge Pool  07/06/2015:  Surgeon:  [name]  Co-Surgeon:  Dr. Karlyne Greenspan Procedure Performed:  1.  Ultrasound guided access, right femoral artery  2.  Aortic arch angiogram  3.  First order catheterization (left carotid artery)  4.  Left carotid angiogram  5.  Right carotid stent with distal embolic protection Dr. Gwenlyn Found will Bil for #5 (the stented portion of the procedure.  I will bill for all diagnostic procedures which would be numbers 1 through 4   please schedule the patient to follow-up with Dr. Kellie Simmering in one month with carotid Doppler study    notified patient of fu appt.  for carotid duplex 08-10-15 1:30 and with dr. Kellie Simmering on 08-16-15 at 8:30

## 2015-07-06 NOTE — Op Note (Signed)
Patient name: [name] MRN: [id number] DOB: Oct 27, 1933 Sex: female  07/06/2015 Pre-operative Diagnosis: Asymptomatic recurrent right carotid stenosis Post-operative diagnosis:  Same Surgeon:  [name]  Co-Surgeon:  Dr. Karlyne Greenspan Procedure Performed:  1.  Ultrasound guided access, right femoral artery  2.  Aortic arch angiogram  3.  First order catheterization (left carotid artery)  4.  Left carotid angiogram  5.  Right carotid stent with distal embolic protection   findings: 95% bifurcation stenosis on the right carotid artery.  Successfully treated using a 10 x 8 x 30 stent with distal embolic protection.  Residual stenosis was less than 5%    Indications:  the patient has a history of right carotid endarterectomy.  She was recently found to have significant change in her velocity profile.  She remained asymptomatic.  She was scheduled for angiography and possible stenting.  Stenting was favored over redo carotid endarterectomy from a safety profile perspective.  She has been on aspirin and Plavix for 1 week.    Procedure:  The patient was identified in the holding area and taken to room 8.  The patient was then placed supine on the table and prepped and draped in the usual sterile fashion.  A time out was called.  Ultrasound was used to evaluate the right common femoral artery.  It was patent .  A digital ultrasound image was acquired.  A micropuncture needle was used to access the right common femoral artery under ultrasound guidance.  An 018 wire was advanced without resistance and a micropuncture sheath was placed.  The 018 wire was removed and a benson wire was placed.  The micropuncture sheath was exchanged for a 5 french sheath.   and a pigtail catheter was advanced into the ascending aorta and an aortic arteriogram was performed.  Next a Berenstein 2 catheter was used to select the right common carotid artery and left carotid artery.  Bilateral carotid grams were then performed  with intracranial imaging.  Screening imaging will be separately interpreted by neuroradiology.   Findings:   aortic arch :  a type I aortic arch is identified.  No significant ostial stenosis was present.  Bilateral subclavian arteries were patent.  The proximal common carotid arteries bilaterally were widely patent.  The innominate artery is widely patent.  Right carotid artery:  The right common carotid artery is widely patent.  The external carotid artery is patent.  At the bifurcation there is a 95% stenosis.    L carotid artery:  No hemodynamic be significant common or internal carotid artery stenosis of the left is observed.  There is a high-grade stenosis of the external iliac artery with a calcific plaque present   Intervention:  After the above images were acquired, the decision was made to proceed with intervention.  A long 6 French sheath was inserted.  The patient was given a bolus and continuous infusion of Angiomax.  The innominate artery and right carotid artery were selected with a headhunter catheter.  An Amplatz superstiff wire was placed.  The long sheath was then advanced into the mid common carotid artery on the right.  After we confirmed a therapeutic ACT, a large  NAV-6 filter was easily advanced across the stenosis and deployed.  Next, the lesion was predilated with a 3 x 20 balloon.  Next we selected a XACT 10 x 8 x 30 stent.  This was successfully deployed across the lesion and then dilated with a 5 x 20 balloon.  Completion  imaging revealed resolution of the stenosis.  Intracranial images were improved as there was that her opacification of the anterior circulation.  The patient remained asymptomatic.  The filter was then retrieved.  It was no angiographic evidence of any debris within the filter.  After the filter was removed.  The long sheath was exchanged out for a short 6 [place] sheath.  The patient tolerated the procedure well without any neurologic complications.  She will be  taken the holding area for sheath pull once her quite relation profile corrects.    Impression:  #1  95% right carotid stenosis successfully treated using a 10 x 8 x 30 carotid stent and distal embolic protection.      Theotis Burrow, M.D. [facility] Office: [phone] Pager:  [phone]

## 2015-07-06 NOTE — Progress Notes (Signed)
Patient site bleeding, assisted back to bed with straight leg, applied pressure, notified cath lab

## 2015-07-06 NOTE — Progress Notes (Signed)
Site area: RFA Site Prior to Removal:  Level 0 Pressure Applied For:60 min Manual:  yes  Patient Status During Pull: stable  Post Pull Site:  Level 0 Post Pull Instructions Given:  yes Post Pull Pulses Present: pt palpable Dressing Applied:  pressure Bedrest begins @ 1235 Comments: active restless leg syndrome continuous LL movement

## 2015-07-06 NOTE — Progress Notes (Signed)
Call emergently into room by unit coordinator regarding patient report of profuse bleeding at Right groin site. Patient was found at edge of bed with pool of blood on floor and active groin bleeding. Vascular precautions ended at 1630 and was advised to wait a little longer. Patient had sat at the edge of bed at 1650 with assistance from Probation officer. Patient right site dressing was CDI 1650. Patient was advised to lay back in bed, fellow nurse assisted patient to laying position  while writer obtained additional gauze to apply pressure and called cath lab for assistance. Patient stated that her Left foot was cramping and she was moving her feet while on side of bed. Obtained vital signs and cycled q26m until cath lab arrived. See echarting for additional data

## 2015-07-07 LAB — GLUCOSE, CAPILLARY
Glucose-Capillary: 125 mg/dL — ABNORMAL HIGH (ref 65–99)
Glucose-Capillary: 110 mg/dL — ABNORMAL HIGH (ref 65–99)

## 2015-07-07 MED ORDER — OXYCODONE HCL 5 MG PO TABS: 5.0000 mg | ORAL_TABLET | Freq: Four times a day (QID) | ORAL | Status: DC | PRN

## 2015-07-07 MED ORDER — CLOPIDOGREL BISULFATE 75 MG PO TABS
75.0000 mg | ORAL_TABLET | Freq: Every day | ORAL | Status: DC
  Administered 2015-07-07: 75 mg via ORAL
  Filled 2015-07-07 (×2): qty 1

## 2015-07-07 NOTE — Discharge Instructions (Signed)
Restart Coumadin per Dr. Doug Sou recommendations.   Okay to restart 07/08/15 per Dr. Trula Slade if okay with Dr. [name].

## 2015-07-07 NOTE — Clinical Social Work Note (Signed)
CSW Consult Acknowledged:   CSW received a consult for with notification that the pt is from [place]. Given the pt is from [facility](she lives in her own apartment) the [facility] will notify the case manager.  CSW will sign off.   [place], MSW, [place]

## 2015-07-07 NOTE — Progress Notes (Signed)
Patient discharged via wheelchair  With family at ~1503 to home
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
## 2015-07-07 NOTE — Progress Notes (Signed)
[facility]  Subjective  - Over all she is doing well.  She was very worried about the right groin bleeding yesterday, but it has not bleed again.   Objective 125/69 98 98.5 F (36.9 C) (Oral) 19 93%  Intake/Output Summary (Last 24 hours) at 07/07/15 0716 Last data filed at 07/07/15 6226  Gross per 24 hour  Intake 571.25 ml  Output   1100 ml  Net -528.75 ml    Right groin dressing in place clean and dry Distally feet warm and well perfused, active range of motion intact Patient ambulating with nurse and rolling walker this am  Assessment/Planning: POD # 1 right carotid stent and percutaneous stick site bleeding now under control   We want her to walk multiple times, eat breakfast and take her PO daily medications then we will remove the dressing later today to check stick site prior to discharge.  Laurence Slate [facility] 07/07/2015 7:16 AM --  Laboratory Lab Results:  Recent Labs  07/06/15 0730 07/06/15 0744  HGB 12.9 12.9  HCT 38.0 38.0   BMET  Recent Labs  07/06/15 0730 07/06/15 0744  NA 137 136  K 4.3 4.2  CL  --  103  GLUCOSE 139* 140*  BUN  --  20  CREATININE  --  0.80    COAG Lab Results  Component Value Date   INR 1.12 07/06/2015   INR 2.1* 06/20/2015   INR 2.4* 05/09/2015   No results found for: PTT

## 2015-07-08 ENCOUNTER — Telehealth: Payer: Self-pay

## 2015-07-08 MED FILL — Heparin Sodium (Porcine) 2 Unit/ML in Sodium Chloride 0.9%: INTRAMUSCULAR | Qty: 2000 | Status: AC

## 2015-07-08 NOTE — Telephone Encounter (Signed)
Received call from patient she stated she a carotid stent 07/06/15 and is doing well.Calling to find out when to restart Coumadin.Dr.Berry advised ok to start coumadin.Continue plavix and aspirin.[facility] manages INR.Advised to check with nurse there for next INR check.

## 2015-07-11 ENCOUNTER — Telehealth: Payer: Self-pay | Admitting: *Deleted

## 2015-07-11 NOTE — Telephone Encounter (Signed)
Received a telephone call from Janora Norlander at [facility] stating patient was recently discharged from the [facility] with a stent and needed orders to recheck her INR.  Sent an order over to her for an INR recheck to be done on 07/15/15, she advised that she only does INR rechecks on Mondays and Thursdays.  She advised to call the patient to see if she would like to come into the office for the initial visit then resume with her for the following weeks. Called the patient and she advised that she would like to come into the office off [address] for the appointment. Advised that I could make the appointment at the [facility] location since it is closer to her but she insisted on coming to [facility] for this visit.  Instructed patient and Milus Banister that we would send the follow up INR orders to [facility] after her visit on Friday.  Understanding was verbalized and appointment was set.

## 2015-07-15 ENCOUNTER — Ambulatory Visit (INDEPENDENT_AMBULATORY_CARE_PROVIDER_SITE_OTHER): Payer: Medicare Other | Admitting: *Deleted

## 2015-07-15 DIAGNOSIS — I214 Non-ST elevation (NSTEMI) myocardial infarction: Secondary | ICD-10-CM | POA: Diagnosis not present

## 2015-07-15 DIAGNOSIS — I4891 Unspecified atrial fibrillation: Secondary | ICD-10-CM

## 2015-07-15 DIAGNOSIS — Z7901 Long term (current) use of anticoagulants: Secondary | ICD-10-CM

## 2015-07-15 LAB — POCT INR: INR: 1.2

## 2015-07-21 ENCOUNTER — Ambulatory Visit (INDEPENDENT_AMBULATORY_CARE_PROVIDER_SITE_OTHER): Payer: Medicare Other | Admitting: Cardiology

## 2015-07-21 DIAGNOSIS — I482 Chronic atrial fibrillation, unspecified: Secondary | ICD-10-CM

## 2015-07-21 LAB — PROTIME-INR: INR: 1.9 — AB (ref 0.9–1.1)

## 2015-08-04 ENCOUNTER — Ambulatory Visit (INDEPENDENT_AMBULATORY_CARE_PROVIDER_SITE_OTHER): Payer: Medicare Other | Admitting: Internal Medicine

## 2015-08-04 DIAGNOSIS — I482 Chronic atrial fibrillation, unspecified: Secondary | ICD-10-CM

## 2015-08-04 DIAGNOSIS — Z7901 Long term (current) use of anticoagulants: Secondary | ICD-10-CM | POA: Diagnosis not present

## 2015-08-04 DIAGNOSIS — I4891 Unspecified atrial fibrillation: Secondary | ICD-10-CM | POA: Diagnosis not present

## 2015-08-04 LAB — POCT INR: INR: 2.24

## 2015-08-04 LAB — PROTIME-INR: INR: 2.2 — AB (ref 0.9–1.1)

## 2015-08-09 ENCOUNTER — Telehealth: Payer: Self-pay | Admitting: Family Medicine

## 2015-08-09 NOTE — Telephone Encounter (Signed)
See below

## 2015-08-09 NOTE — Telephone Encounter (Signed)
Pt reports recently starting metformin and is concerned with her blood sugars.  Pt requesting to be worked in to be seen by pcp one day this week.  No appts available, please advise if patient can be worked in, if so where.

## 2015-08-09 NOTE — Telephone Encounter (Signed)
Both lab and OV have been scheduled. LMOM for patient.

## 2015-08-09 NOTE — Telephone Encounter (Signed)
Patient just lost her husband just as [place] told patient we would work her in at 8 AM on this Thursday morning 8/11. She needs this visit created as well as a lab visit for 8/10 AM scheduled for an a1c under diabetes (need you to order this for me).   Estill Bamberg- can you create the 8 am slot for [place]? Can you also get patient set up for a lab visit for tomorrow and schedule that or have Malachy Mood do that? If lab visit is tomorrow morning will help with having it available

## 2015-08-10 ENCOUNTER — Ambulatory Visit (HOSPITAL_COMMUNITY)
Admission: RE | Admit: 2015-08-10 | Discharge: 2015-08-10 | Disposition: A | Discharge status: Home / Self Care | Payer: Medicare Other | Source: Ambulatory Visit | Attending: Vascular Surgery | Admitting: Vascular Surgery

## 2015-08-10 ENCOUNTER — Other Ambulatory Visit: Payer: Medicare Other

## 2015-08-10 ENCOUNTER — Telehealth: Payer: Self-pay

## 2015-08-10 DIAGNOSIS — Z9889 Other specified postprocedural states: Secondary | ICD-10-CM | POA: Insufficient documentation

## 2015-08-10 DIAGNOSIS — I6522 Occlusion and stenosis of left carotid artery: Secondary | ICD-10-CM | POA: Insufficient documentation

## 2015-08-10 DIAGNOSIS — Z959 Presence of cardiac and vascular implant and graft, unspecified: Secondary | ICD-10-CM

## 2015-08-10 DIAGNOSIS — E119 Type 2 diabetes mellitus without complications: Secondary | ICD-10-CM

## 2015-08-10 DIAGNOSIS — I6521 Occlusion and stenosis of right carotid artery: Secondary | ICD-10-CM | POA: Diagnosis not present

## 2015-08-10 NOTE — Telephone Encounter (Signed)
Lab has been entered.

## 2015-08-10 NOTE — Telephone Encounter (Signed)
Lab entered

## 2015-08-11 ENCOUNTER — Encounter: Payer: Self-pay | Admitting: Family Medicine

## 2015-08-11 ENCOUNTER — Ambulatory Visit (INDEPENDENT_AMBULATORY_CARE_PROVIDER_SITE_OTHER): Payer: Medicare Other | Admitting: Family Medicine

## 2015-08-11 VITALS — BP 122/60 | HR 71 | Temp 98.6°F | Wt 218.0 lb

## 2015-08-11 DIAGNOSIS — I5033 Acute on chronic diastolic (congestive) heart failure: Secondary | ICD-10-CM

## 2015-08-11 DIAGNOSIS — E119 Type 2 diabetes mellitus without complications: Secondary | ICD-10-CM | POA: Diagnosis not present

## 2015-08-11 LAB — BASIC METABOLIC PANEL
BUN: 21 mg/dL (ref 6–23)
CO2: 24 mEq/L (ref 19–32)
Calcium: 9.2 mg/dL (ref 8.4–10.5)
Chloride: 104 mEq/L (ref 96–112)
Creatinine, Ser: 0.87 mg/dL (ref 0.40–1.20)
GFR: 66.58 mL/min (ref >60.00)
GLUCOSE: 180 mg/dL — AB (ref 70–99)
Potassium: 5 mEq/L (ref 3.5–5.1)
Sodium: 137 mEq/L (ref 135–145)

## 2015-08-11 MED ORDER — FUROSEMIDE 20 MG PO TABS: 20.0000 mg | ORAL_TABLET | Freq: Every day | ORAL | Status: DC

## 2015-08-11 NOTE — Assessment & Plan Note (Signed)
S: compliant with metformin, does not check sugars regularly. Suspect mild poor control based off sugar this AM 180. Has lost 4 lbs A/P: continue metformin 500mg  daily while awaiting a1c

## 2015-08-11 NOTE — Assessment & Plan Note (Signed)
S: for unclear reason stopped lasix about a month ago, seems as if she thinks issue is primarily venous. Had been placed on lasix by cards. Poor control as she has develped mild SOB with exertion A/P: advised her to restart lasix (she requests every other day) and agreed to this but if no improvement in SOB in 2 weeks increase to daily. She sees cards next month but advised her to see [place] sooner if symptoms do not return to baseline back on lasix.

## 2015-08-11 NOTE — Progress Notes (Signed)
[name], MD  Subjective:  [name] is a 81 y.o. year old very pleasant female patient who presents with:  See problem oriented charting ROS- no hypoglycemia or blurry vision, no chest pain  Past Medical History- diabetes, CAD, carotid artery stenosis, a fib, diastolic CHF  Medications- reviewed and updated Current Outpatient Prescriptions  Medication Sig Dispense Refill  . aspirin EC 81 MG tablet Take 1 tablet (81 mg total) by mouth daily. 30 tablet 6  . clopidogrel (PLAVIX) 75 MG tablet Take 1 tablet (75 mg total) by mouth daily. 30 tablet 6  . FLUoxetine (PROZAC) 20 MG tablet Take 20 mg by mouth 3 (three) times a week.     . losartan (COZAAR) 50 MG tablet Take 1 tablet (50 mg total) by mouth 2 (two) times daily. 60 tablet 3  . metFORMIN (GLUCOPHAGE) 500 MG tablet Take 1 tablet (500 mg total) by mouth daily with breakfast. 30 tablet 5  . metoprolol succinate (TOPROL-XL) 25 MG 24 hr tablet TAKE 1 TABLET(S) BY MOUTH DAILY 30 tablet 8  . omeprazole (PRILOSEC OTC) 20 MG tablet Take 20 mg by mouth as needed (acid reflux).     . nitroGLYCERIN (NITROSTAT) 0.4 MG SL tablet Place 1 tablet (0.4 mg total) under the tongue every 5 (five) minutes as needed. For chest pain (Patient not taking: Reported on 08/11/2015) 25 tablet 2   Objective: BP 122/60 mmHg  Pulse 71  Temp(Src) 98.6 F (37 C)  Wt 218 lb (98.884 kg) Gen: NAD, resting comfortably CV: irregularly irregular no murmurs rubs or gallops Lungs: CTAB no crackles, wheeze, rhonchi Abdomen: soft/nontender/nondistended/normal bowel sounds. obese Ext: 2+ edema Skin: warm, dry Neuro: grossly normal, moves all extremities, walks with walker   Assessment/Plan:  Diabetes mellitus type II, controlled S: compliant with metformin, does not check sugars regularly. Suspect mild poor control based off sugar this AM 180. Has lost 4 lbs A/P: continue metformin 500mg  daily while awaiting U6J   Diastolic CHF S: for unclear reason  stopped lasix about a month ago, seems as if she thinks issue is primarily venous. Had been placed on lasix by cards. Poor control as she has develped mild SOB with exertion A/P: advised her to restart lasix (she requests every other day) and agreed to this but if no improvement in SOB in 2 weeks increase to daily. She sees cards next month but advised her to see [place] sooner if symptoms do not return to baseline back on lasix.     Return precautions advised.   Orders Placed This Encounter  Procedures  . Basic metabolic panel    [place]  a1c was also supposed to be released-patient returning tomorrow for this  Meds ordered this encounter  Medications  . furosemide (LASIX) 20 MG tablet    Sig: Take 1 tablet (20 mg total) by mouth daily.    Dispense:  30 tablet    Refill:  11

## 2015-08-11 NOTE — Patient Instructions (Signed)
Medication Instructions:  Restart Lasix Otherwise no change  Labwork: a1c before you leave- release prior order for [place] I also ordered a BMET today  Follow-Up: 3-4 month diabetes check in  If shortness of breath does not improve with restarting the lasix every other day, take daily and within 2 weeks not feeling better- advise you to see [place] or cardiology

## 2015-08-12 ENCOUNTER — Encounter: Payer: Self-pay | Admitting: Podiatry

## 2015-08-12 ENCOUNTER — Ambulatory Visit (INDEPENDENT_AMBULATORY_CARE_PROVIDER_SITE_OTHER): Payer: Medicare Other | Admitting: Podiatry

## 2015-08-12 VITALS — BP 116/57 | HR 75 | Resp 18

## 2015-08-12 DIAGNOSIS — B351 Tinea unguium: Secondary | ICD-10-CM

## 2015-08-12 DIAGNOSIS — M79676 Pain in unspecified toe(s): Secondary | ICD-10-CM | POA: Diagnosis not present

## 2015-08-12 DIAGNOSIS — M79674 Pain in right toe(s): Secondary | ICD-10-CM

## 2015-08-12 DIAGNOSIS — M79675 Pain in left toe(s): Secondary | ICD-10-CM

## 2015-08-12 LAB — HEMOGLOBIN A1C: Hgb A1c MFr Bld: 6.7 % — ABNORMAL HIGH (ref 4.6–6.5)

## 2015-08-12 NOTE — Progress Notes (Signed)
Patient ID: [name], female   DOB: [birth year], 81 y.o.   MRN: [id number] Complaint:  Visit Type: Patient returns to my office for continued preventative foot care services. Complaint: Patient states" my nails have grown long and thick and become painful to walk and wear shoes. The patient presents for preventative foot care services. No changes to ROS  Podiatric Exam: Vascular: dorsalis pedis and posterior tibial pulses are palpable bilateral. Capillary return is immediate. Temperature gradient is WNL. Skin turgor WNL  Sensorium: Normal Semmes Weinstein monofilament test. Normal tactile sensation bilaterally. Nail Exam: Pt has thick disfigured discolored nails with subungual debris noted bilateral entire nail hallux through fifth toenails Ulcer Exam: There is no evidence of ulcer or pre-ulcerative changes or infection. Orthopedic Exam: Muscle tone and strength are WNL. No limitations in general ROM. No crepitus or effusions noted. Foot type and digits show no abnormalities. Bony prominences are unremarkable. Skin: No Porokeratosis. No infection or ulcers  Diagnosis:  Onychomycosis, , Pain in right toe, pain in left toes  Treatment & Plan Procedures and Treatment: Consent by patient was obtained for treatment procedures. The patient understood the discussion of treatment and procedures well. All questions were answered thoroughly reviewed. Debridement of mycotic and hypertrophic toenails, 1 through 5 bilateral and clearing of subungual debris. No ulceration, no infection noted.  Return Visit-Office Procedure: Patient instructed to return to the office for a follow up visit 3 months for continued evaluation and treatment.

## 2015-08-12 NOTE — Addendum Note (Signed)
Addended by: Elmer Picker on: 08/12/2015 02:34 PM   Modules accepted: Orders

## 2015-08-15 ENCOUNTER — Encounter: Payer: Self-pay | Admitting: Vascular Surgery

## 2015-08-16 ENCOUNTER — Encounter: Payer: Self-pay | Admitting: Vascular Surgery

## 2015-08-16 ENCOUNTER — Ambulatory Visit: Payer: Medicare Other | Admitting: Vascular Surgery

## 2015-08-16 ENCOUNTER — Ambulatory Visit (INDEPENDENT_AMBULATORY_CARE_PROVIDER_SITE_OTHER): Payer: Medicare Other | Admitting: Vascular Surgery

## 2015-08-16 VITALS — BP 123/74 | HR 76 | Temp 98.0°F | Resp 18 | Ht 68.5 in | Wt 215.0 lb

## 2015-08-16 DIAGNOSIS — I6523 Occlusion and stenosis of bilateral carotid arteries: Secondary | ICD-10-CM

## 2015-08-16 DIAGNOSIS — I6521 Occlusion and stenosis of right carotid artery: Secondary | ICD-10-CM
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
## 2015-08-17 NOTE — Addendum Note (Signed)
Addended by: Dorthula Rue L on: 08/17/2015 05:02 PM   Modules accepted: Orders

## 2015-08-18 ENCOUNTER — Ambulatory Visit (INDEPENDENT_AMBULATORY_CARE_PROVIDER_SITE_OTHER): Payer: Medicare Other | Admitting: *Deleted

## 2015-08-18 DIAGNOSIS — I214 Non-ST elevation (NSTEMI) myocardial infarction: Secondary | ICD-10-CM

## 2015-08-18 DIAGNOSIS — Z7901 Long term (current) use of anticoagulants: Secondary | ICD-10-CM

## 2015-08-18 DIAGNOSIS — I4891 Unspecified atrial fibrillation: Secondary | ICD-10-CM | POA: Diagnosis not present

## 2015-08-18 DIAGNOSIS — I482 Chronic atrial fibrillation, unspecified: Secondary | ICD-10-CM

## 2015-08-18 LAB — PROTIME-INR: INR: 1.9 — AB (ref 0.9–1.1)

## 2015-08-23 ENCOUNTER — Telehealth: Payer: Self-pay | Admitting: *Deleted

## 2015-08-23 NOTE — Telephone Encounter (Signed)
Pt called request in Rx for Lichen Sclerosus pt last seen in 2014. I transferred pt to front desk to schedule.

## 2015-09-05 ENCOUNTER — Other Ambulatory Visit: Payer: Self-pay | Admitting: Cardiology

## 2015-09-08 ENCOUNTER — Ambulatory Visit (INDEPENDENT_AMBULATORY_CARE_PROVIDER_SITE_OTHER): Payer: Medicare Other | Admitting: Cardiology

## 2015-09-08 DIAGNOSIS — I482 Chronic atrial fibrillation, unspecified: Secondary | ICD-10-CM

## 2015-09-08 DIAGNOSIS — Z7901 Long term (current) use of anticoagulants: Secondary | ICD-10-CM | POA: Diagnosis not present

## 2015-09-08 DIAGNOSIS — I4891 Unspecified atrial fibrillation: Secondary | ICD-10-CM | POA: Diagnosis not present

## 2015-09-08 LAB — POCT INR: INR: 1.93

## 2015-09-19 ENCOUNTER — Ambulatory Visit (INDEPENDENT_AMBULATORY_CARE_PROVIDER_SITE_OTHER): Payer: Medicare Other | Admitting: Gynecology

## 2015-09-19 ENCOUNTER — Encounter: Payer: Self-pay | Admitting: Gynecology

## 2015-09-19 VITALS — BP 124/80 | Ht 67.0 in | Wt 216.0 lb

## 2015-09-19 DIAGNOSIS — Z01419 Encounter for gynecological examination (general) (routine) without abnormal findings: Secondary | ICD-10-CM | POA: Diagnosis not present

## 2015-09-19 DIAGNOSIS — N952 Postmenopausal atrophic vaginitis: Secondary | ICD-10-CM | POA: Diagnosis not present

## 2015-09-19 DIAGNOSIS — L900 Lichen sclerosus et atrophicus: Secondary | ICD-10-CM | POA: Diagnosis not present

## 2015-09-19 MED ORDER — CLOBETASOL PROPIONATE 0.05 % EX CREA
TOPICAL_CREAM | CUTANEOUS | Status: DC
Start: 2015-09-19 — End: 2016-11-15

## 2015-09-19 NOTE — Progress Notes (Signed)
CHRISTE TELLEZ 04/13/1933 [id number]        81 y.o.  G3P3003 for breast and pelvic exam. Several issues noted below.  Past medical history,surgical history, problem list, medications, allergies, family history and social history were all reviewed and documented as reviewed in the EPIC chart.  ROS:  Performed with pertinent positives and negatives included in the history, assessment and plan.   Additional significant findings :  none   Exam: Kim Counsellor Vitals:   09/19/15 1359  BP: 124/80  Height: 5\' 7"  (1.702 m)  Weight: 216 lb (97.977 kg)   General appearance:  Normal affect, orientation and appearance. Skin: Grossly normal HEENT: Without gross lesions.  No cervical or supraclavicular adenopathy. Thyroid normal.  Lungs:  Clear without wheezing, rales or rhonchi Cardiac: RR, without RMG Abdominal:  Soft, nontender, without masses, guarding, rebound, organomegaly or hernia Breasts:  Examined lying and sitting without masses, retractions, discharge or axillary adenopathy. Pelvic:  Ext/BUS/vagina with generalized atrophic changes. Vitiligo type skin change periclitoral hood consistent with lichen sclerosis.  Cervix grossly normal atrophic.  Uterus unable to palpate due to abdominal girth. No gross masses or tenderness,   Adnexa  Without gross masses or tenderness    Anus and perineum  Normal   Rectovaginal  Normal sphincter tone without palpated masses or tenderness.    Assessment/Plan:  81 y.o. G6P3003 female for annual exam .   1. Postmenopausal/atrophic genital changes. Without significant hot flushes, night sweats vaginal dryness. No vaginal bleeding. Report any vaginal bleeding. 2. Lichen sclerosis. Patient ran out of her Temovate cream and is having significant itching on and off. Exam consistent with her diagnosis particularly in the periclitoral region. Temovate 0.05% cream 60 g 1 refill provided to be used as needed. Need to avoid daily for extended periods of time  reviewed. 3. Mammography years ago. Patient refuses mammography as she states she would not do anything different regardless. 4. Colonoscopy 2013. 5. Pap smear 2010. No Pap smear done today. No history of abnormal Pap smears. We both agree to stop screening per current screening guidelines. 6. DEXA 2010 normal. Patient refuses repeat DEXA as she states she would not take any medication or do anything different. 7. Health maintenance. No routine lab work done as this is done through her primary physician's office. Follow up 1 year, sooner as needed.   Anastasio Auerbach MD, 2:48 PM 09/19/2015

## 2015-09-19 NOTE — Patient Instructions (Signed)
Follow up as needed

## 2015-09-22 ENCOUNTER — Encounter: Payer: Self-pay | Admitting: Podiatry

## 2015-09-22 ENCOUNTER — Ambulatory Visit (INDEPENDENT_AMBULATORY_CARE_PROVIDER_SITE_OTHER): Payer: Medicare Other | Admitting: Podiatry

## 2015-09-22 ENCOUNTER — Ambulatory Visit (INDEPENDENT_AMBULATORY_CARE_PROVIDER_SITE_OTHER): Payer: Medicare Other | Admitting: Cardiovascular Disease

## 2015-09-22 DIAGNOSIS — M79676 Pain in unspecified toe(s): Secondary | ICD-10-CM | POA: Diagnosis not present

## 2015-09-22 DIAGNOSIS — B351 Tinea unguium: Secondary | ICD-10-CM

## 2015-09-22 DIAGNOSIS — M79675 Pain in left toe(s): Secondary | ICD-10-CM | POA: Diagnosis not present

## 2015-09-22 DIAGNOSIS — I4891 Unspecified atrial fibrillation: Secondary | ICD-10-CM | POA: Diagnosis not present

## 2015-09-22 DIAGNOSIS — I482 Chronic atrial fibrillation, unspecified: Secondary | ICD-10-CM

## 2015-09-22 DIAGNOSIS — M79674 Pain in right toe(s): Secondary | ICD-10-CM

## 2015-09-22 DIAGNOSIS — Z7901 Long term (current) use of anticoagulants: Secondary | ICD-10-CM | POA: Diagnosis not present

## 2015-09-22 LAB — PROTIME-INR: INR: 2.1 — AB (ref 0.9–1.1)

## 2015-09-22 NOTE — Progress Notes (Signed)
Patient ID: [name], female   DOB: 11/13/1933, 82 y.o.   MRN: [id number] Complaint:  Visit Type: Patient returns to my office for continued preventative foot care services. Complaint: Patient states" my nails have grown long and thick and become painful to walk and wear shoes. The patient presents for preventative foot care services. No changes to ROS  Podiatric Exam: Vascular: dorsalis pedis and posterior tibial pulses are palpable bilateral. Capillary return is immediate. Temperature gradient is WNL. Skin turgor WNL  Sensorium: Normal Semmes Weinstein monofilament test. Normal tactile sensation bilaterally. Nail Exam: Pt has thick disfigured discolored nails with subungual debris noted bilateral entire nail hallux through fifth toenails Ulcer Exam: There is no evidence of ulcer or pre-ulcerative changes or infection. Orthopedic Exam: Muscle tone and strength are WNL. No limitations in general ROM. No crepitus or effusions noted. Foot type and digits show no abnormalities. Bony prominences are unremarkable. Skin: No Porokeratosis. No infection or ulcers  Diagnosis:  Onychomycosis, , Pain in right toe, pain in left toes  Treatment & Plan Procedures and Treatment: Consent by patient was obtained for treatment procedures. The patient understood the discussion of treatment and procedures well. All questions were answered thoroughly reviewed. Debridement of mycotic and hypertrophic toenails, 1 through 5 bilateral and clearing of subungual debris. No ulceration, no infection noted.  Return Visit-Office Procedure: Patient instructed to return to the office for a follow up visit 3 months for continued evaluation and treatment. 

## 2015-09-29 ENCOUNTER — Encounter: Payer: Self-pay | Admitting: Cardiology

## 2015-09-29 ENCOUNTER — Ambulatory Visit (INDEPENDENT_AMBULATORY_CARE_PROVIDER_SITE_OTHER): Payer: Private Health Insurance - Indemnity | Admitting: Cardiology

## 2015-09-29 VITALS — BP 148/90 | HR 71 | Ht 68.0 in | Wt 215.8 lb

## 2015-09-29 DIAGNOSIS — I482 Chronic atrial fibrillation, unspecified: Secondary | ICD-10-CM

## 2015-09-29 DIAGNOSIS — I6521 Occlusion and stenosis of right carotid artery: Secondary | ICD-10-CM | POA: Diagnosis not present

## 2015-09-29 DIAGNOSIS — I2510 Atherosclerotic heart disease of native coronary artery without angina pectoris: Secondary | ICD-10-CM | POA: Diagnosis not present

## 2015-09-29 DIAGNOSIS — I5032 Chronic diastolic (congestive) heart failure: Secondary | ICD-10-CM
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
## 2015-09-29 NOTE — Patient Instructions (Signed)
Try taking lasix 20 mg every other day with potassium  Continue your other therapy  Try and exercise more  I will see you in 4-6 months

## 2015-10-01 ENCOUNTER — Other Ambulatory Visit: Payer: Self-pay | Admitting: Family Medicine

## 2015-10-04 ENCOUNTER — Encounter: Payer: Self-pay | Admitting: Family Medicine

## 2015-10-04 ENCOUNTER — Ambulatory Visit (INDEPENDENT_AMBULATORY_CARE_PROVIDER_SITE_OTHER): Payer: Medicare Other | Admitting: Family Medicine

## 2015-10-04 DIAGNOSIS — Z23 Encounter for immunization: Secondary | ICD-10-CM

## 2015-10-04 DIAGNOSIS — M79674 Pain in right toe(s): Secondary | ICD-10-CM

## 2015-10-04 NOTE — Patient Instructions (Addendum)
Flu shot given today.  Let's give this about 10-14 days  Icing twice a day for 10-20 minutes would be advisable  We held off on antiinflammatories given your plavix  Held off on prednisone given your diabetes  But we may have to use one of these if it is gout  If still having issues in 2 weeks, we will get x-ray and uric acid. Just give me a call and update [place]

## 2015-10-04 NOTE — Progress Notes (Signed)
[name], MD  Subjective:  [name] is a 81 y.o. year old very pleasant female patient who presents with:  Right great toe pain -1st IP joint on right foot. Mild to moderate aching in joint. Tends to come and go. Over last few days has improved good deal but wanted to be evaluated given continued pain. Never had anything like this before. No history of gout. Not on HCTZ, no alcohol, no shellfish recently. No fall or injury. Did buy new shoes a month ago. There has been some swelling but minimal warmth or redness at the joint ROS- no fever, chills, nausea, vomiting  Past Medical History-  Patient Active Problem List   Diagnosis Date Noted  . Atrial fibrillation ([place]) 01/08/2014    Priority: High  . Diastolic CHF, chronic ([place]) 01/02/2014    Priority: High  . Diabetes mellitus type II, controlled ([place]) 01/02/2014    Priority: High  . Coronary artery disease     Priority: High  . Carotid artery stenosis 04/01/2012    Priority: High  . Depression 03/25/2015    Priority: Medium  . HTN (hypertension) 08/29/2011    Priority: Medium  . Hyperlipidemia 07/18/2011    Priority: Medium  . GERD (gastroesophageal reflux disease) 03/25/2015    Priority: Low  . Long term current use of anticoagulant therapy 01/08/2014    Priority: Low  . Lichen sclerosus     Priority: Low  . Obesities, morbid ([place]) 07/18/2011    Priority: Low  . Carotid artery stenosis, asymptomatic 07/06/2015  . Carotid stenosis 07/06/2015  . Edema leg 05/26/2015  . Spinal stenosis    Medications- reviewed and updated Current Outpatient Prescriptions  Medication Sig Dispense Refill  . clobetasol cream (TEMOVATE) 0.05 % Apply nightly as needed for irritation 60 g 1  . clopidogrel (PLAVIX) 75 MG tablet Take 1 tablet (75 mg total) by mouth daily. 30 tablet 6  . FLUoxetine (PROZAC) 20 MG tablet Take 20 mg by mouth 3 (three) times a week.     . losartan (COZAAR) 50 MG tablet Take 1 tablet (50 mg total) by mouth  2 (two) times daily. 60 tablet 3  . metFORMIN (GLUCOPHAGE) 500 MG tablet TAKE 1 TABLET (500 MG TOTAL) BY MOUTH DAILY WITH BREAKFAST. 30 tablet 5  . metoprolol succinate (TOPROL-XL) 25 MG 24 hr tablet TAKE 1 TABLET(S) BY MOUTH DAILY 30 tablet 8  . nitroGLYCERIN (NITROSTAT) 0.4 MG SL tablet Place 1 tablet (0.4 mg total) under the tongue every 5 (five) minutes as needed. For chest pain 25 tablet 2  . omeprazole (PRILOSEC OTC) 20 MG tablet Take 20 mg by mouth as needed (acid reflux).     . warfarin (COUMADIN) 5 MG tablet Take as directed by coumadin clinic 30 tablet 3   No current facility-administered medications for this visit.    Objective: BP 130/80 mmHg  Pulse 97  Temp(Src) 98.2 F (36.8 C)  Wt 214 lb (97.07 kg) Gen: NAD, resting comfortably Lungs: nonlabored Abdomen: soft/nontender/nondistended/normal bowel sounds. Ext: trace edema R IP joint with mild swelling, no erythema or warmth. Mild pain along joint line. Mild pain with flexion then extension. Minimal warmth or erythema Skin: warm, dry, no rash Neuro: grossly normal, moves all extremities, walks with walker  Assessment/Plan:  R IP joint pain Sprain vs. Gout. Doubt fracture with no fall or major injury or increased walking. nsaids not ideal on plavix or with known CAD.  Patient does not want to take prednisone with diabetes. We  opted to follow up in 2 weeks by phone. Icing BID trial as oral agents not ideal.  The further out the better to check uric acid so would do that if continued low level issues at time. LIkely get films of toe as well though suspect low yield.   Return precautions advised.   Orders Placed This Encounter  Procedures  . Flu Vaccine QUAD 36+ mos IM

## 2015-10-07 DIAGNOSIS — H02403 Unspecified ptosis of bilateral eyelids: Secondary | ICD-10-CM | POA: Diagnosis not present

## 2015-10-07 DIAGNOSIS — Z961 Presence of intraocular lens: Secondary | ICD-10-CM | POA: Diagnosis not present

## 2015-10-07 DIAGNOSIS — H35372 Puckering of macula, left eye: Secondary | ICD-10-CM | POA: Diagnosis not present

## 2015-10-07 DIAGNOSIS — E113292 Type 2 diabetes mellitus with mild nonproliferative diabetic retinopathy without macular edema, left eye: Secondary | ICD-10-CM | POA: Diagnosis not present

## 2015-10-07 LAB — HM DIABETES EYE EXAM

## 2015-10-10 ENCOUNTER — Encounter: Payer: Self-pay | Admitting: Family Medicine

## 2015-10-10 DIAGNOSIS — E113292 Type 2 diabetes mellitus with mild nonproliferative diabetic retinopathy without macular edema, left eye: Secondary | ICD-10-CM | POA: Insufficient documentation

## 2015-10-10 LAB — HM DIABETES EYE EXAM

## 2015-10-13 ENCOUNTER — Ambulatory Visit (INDEPENDENT_AMBULATORY_CARE_PROVIDER_SITE_OTHER): Payer: Private Health Insurance - Indemnity | Admitting: Interventional Cardiology

## 2015-10-13 DIAGNOSIS — I482 Chronic atrial fibrillation, unspecified: Secondary | ICD-10-CM

## 2015-10-13 DIAGNOSIS — Z7901 Long term (current) use of anticoagulants: Secondary | ICD-10-CM | POA: Diagnosis not present

## 2015-10-13 DIAGNOSIS — I4891 Unspecified atrial fibrillation: Secondary | ICD-10-CM | POA: Diagnosis not present

## 2015-10-13 LAB — PROTIME-INR: INR: 1.6 — AB (ref 0.9–1.1)

## 2015-10-14 ENCOUNTER — Ambulatory Visit: Payer: Medicare Other | Admitting: Podiatry

## 2015-10-31 ENCOUNTER — Ambulatory Visit (INDEPENDENT_AMBULATORY_CARE_PROVIDER_SITE_OTHER): Payer: Private Health Insurance - Indemnity | Admitting: Cardiology

## 2015-10-31 DIAGNOSIS — I4891 Unspecified atrial fibrillation: Secondary | ICD-10-CM | POA: Diagnosis not present

## 2015-10-31 DIAGNOSIS — I482 Chronic atrial fibrillation, unspecified: Secondary | ICD-10-CM

## 2015-10-31 DIAGNOSIS — Z7901 Long term (current) use of anticoagulants: Secondary | ICD-10-CM | POA: Diagnosis not present

## 2015-10-31 LAB — PROTIME-INR: INR: 2.3 — AB (ref 0.9–1.1)

## 2015-11-15 DIAGNOSIS — R262 Difficulty in walking, not elsewhere classified: Secondary | ICD-10-CM | POA: Diagnosis not present

## 2015-11-15 DIAGNOSIS — M6281 Muscle weakness (generalized): Secondary | ICD-10-CM | POA: Diagnosis not present

## 2015-11-15 DIAGNOSIS — R2681 Unsteadiness on feet: Secondary | ICD-10-CM | POA: Diagnosis not present

## 2015-11-17 ENCOUNTER — Ambulatory Visit (INDEPENDENT_AMBULATORY_CARE_PROVIDER_SITE_OTHER): Payer: Medicare Other | Admitting: Podiatry

## 2015-11-17 ENCOUNTER — Ambulatory Visit: Payer: Medicare Other | Admitting: Podiatry

## 2015-11-17 ENCOUNTER — Encounter: Payer: Self-pay | Admitting: Podiatry

## 2015-11-17 DIAGNOSIS — B351 Tinea unguium: Secondary | ICD-10-CM | POA: Diagnosis not present

## 2015-11-17 DIAGNOSIS — M79675 Pain in left toe(s): Secondary | ICD-10-CM

## 2015-11-17 DIAGNOSIS — M79676 Pain in unspecified toe(s): Secondary | ICD-10-CM

## 2015-11-17 DIAGNOSIS — M79674 Pain in right toe(s): Secondary | ICD-10-CM | POA: Diagnosis not present

## 2015-11-17 NOTE — Progress Notes (Signed)
Patient ID: [name], female   DOB: [birth year], 81 y.o.   MRN: [id number] Complaint:  Visit Type: Patient returns to my office for continued preventative foot care services. Complaint: Patient states" my nails have grown long and thick and become painful to walk and wear shoes. The patient presents for preventative foot care services. No changes to ROS  Podiatric Exam: Vascular: dorsalis pedis and posterior tibial pulses are palpable bilateral. Capillary return is immediate. Temperature gradient is WNL. Skin turgor WNL  Sensorium: Normal Semmes Weinstein monofilament test. Normal tactile sensation bilaterally. Nail Exam: Pt has thick disfigured discolored nails with subungual debris noted bilateral entire nail hallux through fifth toenails Ulcer Exam: There is no evidence of ulcer or pre-ulcerative changes or infection. Orthopedic Exam: Muscle tone and strength are WNL. No limitations in general ROM. No crepitus or effusions noted. Foot type and digits show no abnormalities. Bony prominences are unremarkable. Skin: No Porokeratosis. No infection or ulcers  Diagnosis:  Onychomycosis, , Pain in right toe, pain in left toes  Treatment & Plan Procedures and Treatment: Consent by patient was obtained for treatment procedures. The patient understood the discussion of treatment and procedures well. All questions were answered thoroughly reviewed. Debridement of mycotic and hypertrophic toenails, 1 through 5 bilateral and clearing of subungual debris. No ulceration, no infection noted.  Return Visit-Office Procedure: Patient instructed to return to the office for a follow up visit 3 months for continued evaluation and treatment.  Gardiner Barefoot DPM

## 2015-11-18 ENCOUNTER — Encounter: Payer: Self-pay | Admitting: Family Medicine

## 2015-11-18 ENCOUNTER — Ambulatory Visit (INDEPENDENT_AMBULATORY_CARE_PROVIDER_SITE_OTHER): Payer: Medicare Other | Admitting: Family Medicine

## 2015-11-18 VITALS — BP 130/82 | HR 99 | Temp 98.8°F | Wt 215.0 lb

## 2015-11-18 DIAGNOSIS — I482 Chronic atrial fibrillation, unspecified: Secondary | ICD-10-CM

## 2015-11-18 DIAGNOSIS — D649 Anemia, unspecified: Secondary | ICD-10-CM

## 2015-11-18 DIAGNOSIS — E119 Type 2 diabetes mellitus without complications: Secondary | ICD-10-CM

## 2015-11-18 DIAGNOSIS — E113292 Type 2 diabetes mellitus with mild nonproliferative diabetic retinopathy without macular edema, left eye: Secondary | ICD-10-CM | POA: Diagnosis not present

## 2015-11-18 DIAGNOSIS — J3089 Other allergic rhinitis: Secondary | ICD-10-CM

## 2015-11-18 DIAGNOSIS — R5383 Other fatigue: Secondary | ICD-10-CM | POA: Diagnosis not present

## 2015-11-18 DIAGNOSIS — E1139 Type 2 diabetes mellitus with other diabetic ophthalmic complication: Secondary | ICD-10-CM | POA: Diagnosis not present

## 2015-11-18 DIAGNOSIS — E039 Hypothyroidism, unspecified: Secondary | ICD-10-CM | POA: Diagnosis not present

## 2015-11-18 DIAGNOSIS — I519 Heart disease, unspecified: Secondary | ICD-10-CM

## 2015-11-18 MED ORDER — LORATADINE 10 MG PO CAPS: 10.0000 mg | ORAL_CAPSULE | Freq: Every day | ORAL | Status: DC

## 2015-11-18 NOTE — Progress Notes (Signed)
[name], MD  Subjective:  [name] is a 81 y.o. year old very pleasant female patient who presents for/with See problem oriented charting ROS- No chest pain at rest or with activity. No headache or blurry vision. No dizziness. Does have some shortness of breath with activity due to CHF without recent worsening. Does have some worening fatigue though  Past Medical History-  Patient Active Problem List   Diagnosis Date Noted  . Non-proliferative diabetic retinopathy, mild, left eye ([place]) 10/10/2015    Priority: High  . Atrial fibrillation ([place]) 01/08/2014    Priority: High  . Diastolic CHF, chronic ([place]) 01/02/2014    Priority: High  . Type II diabetes mellitus with ophthalmic manifestations ([place]) 01/02/2014    Priority: High  . Coronary artery disease     Priority: High  . Carotid artery stenosis 04/01/2012    Priority: High  . Depression 03/25/2015    Priority: Medium  . HTN (hypertension) 08/29/2011    Priority: Medium  . Hyperlipidemia 07/18/2011    Priority: Medium  . Allergic rhinitis 11/19/2015    Priority: Low  . GERD (gastroesophageal reflux disease) 03/25/2015    Priority: Low  . Long term current use of anticoagulant therapy 01/08/2014    Priority: Low  . Lichen sclerosus     Priority: Low  . Obesities, morbid ([place]) 07/18/2011    Priority: Low  . Carotid artery stenosis, asymptomatic 07/06/2015  . Carotid stenosis 07/06/2015  . Edema leg 05/26/2015  . Spinal stenosis     Medications- reviewed and updated Current Outpatient Prescriptions  Medication Sig Dispense Refill  . clobetasol cream (TEMOVATE) 0.05 % Apply nightly as needed for irritation 60 g 1  . clopidogrel (PLAVIX) 75 MG tablet Take 1 tablet (75 mg total) by mouth daily. 30 tablet 6  . FLUoxetine (PROZAC) 20 MG tablet Take 20 mg by mouth 3 (three) times a week.     . losartan (COZAAR) 50 MG tablet Take 1 tablet (50 mg total) by mouth 2 (two) times daily. 60 tablet 3  . metFORMIN  (GLUCOPHAGE) 500 MG tablet TAKE 1 TABLET (500 MG TOTAL) BY MOUTH DAILY WITH BREAKFAST. 30 tablet 5  . metoprolol succinate (TOPROL-XL) 25 MG 24 hr tablet TAKE 1 TABLET(S) BY MOUTH DAILY 30 tablet 8  . omeprazole (PRILOSEC OTC) 20 MG tablet Take 20 mg by mouth as needed (acid reflux).     . warfarin (COUMADIN) 5 MG tablet Take as directed by coumadin clinic 30 tablet 3  . Loratadine 10 MG CAPS Take 1 capsule (10 mg total) by mouth daily. 30 each 5  . nitroGLYCERIN (NITROSTAT) 0.4 MG SL tablet Place 1 tablet (0.4 mg total) under the tongue every 5 (five) minutes as needed. For chest pain (Patient not taking: Reported on 11/18/2015) 25 tablet 2   No current facility-administered medications for this visit.    Objective: BP 130/82 mmHg  Pulse 99  Temp(Src) 98.8 F (37.1 C)  Wt 215 lb (97.523 kg)  SpO2 94% Gen: NAD, resting comfortably CV: irregularly irregular,  no murmurs rubs or gallops Lungs: CTAB no crackles, wheeze, rhonchi Abdomen: soft/nontender/nondistended/normal bowel sounds. No rebound or guarding.  Ext: trace edema Skin: warm, dry Neuro: grossly normal, moves all extremities  Assessment/Plan:  Type II diabetes mellitus with ophthalmic manifestations (HCC) S: reasonable control of diabetes on metformin 500mg  alone.  Lab Results  Component Value Date   HGBA1C 7.1* 11/18/2015  A/P: continue current rx. Did provide meter for intermittent checks as  needed. Consider 1000mg  daily dosing either through once daily 1g or BID 500mg  if any trend up at follow up.    Non-proliferative diabetic retinopathy, mild, left eye (HCC) S: No macular edema- only requires monitoring A/P: continue optho follow up as well as work to control diabetes   Atrial fibrillation S: rate countrolled on metoprolol 25mg  XL. Coumadin through [facility] clinic for anticoagulation A/P: continue current rx   Allergic rhinitis S: used to deal with allergies and would take claritin. Went to store and was not  sure what to take. Recurrence of symptoms this fall including watery, itchy eyes and runny nose, sinus congestion.  A/P: sent in claritin generic OTC 10mg  and this can be pointed out by pharmacist if not covered    Fatigue increasing On labs- noted drop in hemoglobin- patient was not acutely symptomatic. We will reach out to patient to see if any potential source but likely will need GI follow up. Also likely to put on iron to help while awaiting this evaluation. If colonoscopy- would have to come off coumadin unfortunatley.   Return precautions advised.   Orders Placed This Encounter  Procedures  . CBC with Differential/Platelet  . Comprehensive metabolic panel  . TSH  . Hemoglobin A1c    Meds ordered this encounter  Medications  . Loratadine 10 MG CAPS    Sig: Take 1 capsule (10 mg total) by mouth daily.    Dispense:  30 each    Refill:  5

## 2015-11-18 NOTE — Patient Instructions (Signed)
Claritin/allergra is fine for allergies. Your lungs are clear fortunately.   Update labs- rule out lab causes of fatigue  See [place] back for new or worsening symptoms

## 2015-11-19 DIAGNOSIS — J309 Allergic rhinitis, unspecified: Secondary | ICD-10-CM | POA: Insufficient documentation

## 2015-11-19 LAB — CBC WITH DIFFERENTIAL/PLATELET
BASOS ABS: 0 10*3/uL (ref 0.0–0.1)
BASOS PCT: 0 % (ref 0–1)
EOS ABS: 0.1 10*3/uL (ref 0.0–0.7)
EOS PCT: 1 % (ref 0–5)
HCT: 33.8 % — ABNORMAL LOW (ref 36.0–46.0)
Hemoglobin: 10.1 g/dL — ABNORMAL LOW (ref 12.0–15.0)
LYMPHS ABS: 1 10*3/uL (ref 0.7–4.0)
Lymphocytes Relative: 11 % — ABNORMAL LOW (ref 12–46)
MCH: 21.1 pg — AB (ref 26.0–34.0)
MCHC: 29.9 g/dL — ABNORMAL LOW (ref 30.0–36.0)
MCV: 70.6 fL — AB (ref 78.0–100.0)
MPV: 9.7 fL (ref 8.6–12.4)
Monocytes Absolute: 1 10*3/uL (ref 0.1–1.0)
Monocytes Relative: 10 % (ref 3–12)
Neutro Abs: 7.4 10*3/uL (ref 1.7–7.7)
Neutrophils Relative %: 78 % — ABNORMAL HIGH (ref 43–77)
PLATELETS: 413 10*3/uL — AB (ref 150–400)
RBC: 4.79 MIL/uL (ref 3.87–5.11)
RDW: 17.4 % — AB (ref 11.5–15.5)
WBC: 9.5 10*3/uL (ref 4.0–10.5)

## 2015-11-19 LAB — COMPREHENSIVE METABOLIC PANEL
ALT: 16 U/L (ref 6–29)
AST: 20 U/L (ref 10–35)
Albumin: 3.8 g/dL (ref 3.6–5.1)
Alkaline Phosphatase: 75 U/L (ref 33–130)
BUN: 15 mg/dL (ref 7–25)
CO2: 26 mmol/L (ref 20–31)
CREATININE: 0.74 mg/dL (ref 0.60–0.88)
Calcium: 8.9 mg/dL (ref 8.6–10.4)
Chloride: 101 mmol/L (ref 98–110)
GLUCOSE: 99 mg/dL (ref 65–99)
POTASSIUM: 5.3 mmol/L (ref 3.5–5.3)
SODIUM: 136 mmol/L (ref 135–146)
TOTAL PROTEIN: 6.3 g/dL (ref 6.1–8.1)
Total Bilirubin: 0.5 mg/dL (ref 0.2–1.2)

## 2015-11-19 LAB — HEMOGLOBIN A1C
Hgb A1c MFr Bld: 7.1 % — ABNORMAL HIGH (ref <5.7)
Mean Plasma Glucose: 157 mg/dL — ABNORMAL HIGH (ref <117)

## 2015-11-19 LAB — TSH: TSH: 3.157 u[IU]/mL (ref 0.350–4.500)

## 2015-11-19 NOTE — Assessment & Plan Note (Signed)
S: used to deal with allergies and would take claritin. Went to store and was not sure what to take. Recurrence of symptoms this fall including watery, itchy eyes and runny nose, sinus congestion.  A/P: sent in claritin generic OTC 10mg  and this can be pointed out by pharmacist if not covered

## 2015-11-19 NOTE — Assessment & Plan Note (Signed)
S: No macular edema- only requires monitoring A/P: continue optho follow up as well as work to control diabetes

## 2015-11-19 NOTE — Assessment & Plan Note (Signed)
S: rate countrolled on metoprolol 25mg  XL. Coumadin through [facility] clinic for anticoagulation A/P: continue current rx

## 2015-11-19 NOTE — Assessment & Plan Note (Addendum)
S: reasonable control of diabetes on metformin 500mg  alone.  Lab Results  Component Value Date   HGBA1C 7.1* 11/18/2015  A/P: continue current rx. Did provide meter for intermittent checks as needed. Consider 1000mg  daily dosing either through once daily 1g or BID 500mg  if any trend up at follow up.

## 2015-11-21 ENCOUNTER — Other Ambulatory Visit: Payer: Self-pay | Admitting: Family Medicine

## 2015-11-21 ENCOUNTER — Ambulatory Visit (INDEPENDENT_AMBULATORY_CARE_PROVIDER_SITE_OTHER): Payer: Private Health Insurance - Indemnity | Admitting: Internal Medicine

## 2015-11-21 DIAGNOSIS — I482 Chronic atrial fibrillation, unspecified: Secondary | ICD-10-CM

## 2015-11-21 DIAGNOSIS — D509 Iron deficiency anemia, unspecified: Secondary | ICD-10-CM

## 2015-11-21 DIAGNOSIS — Z7901 Long term (current) use of anticoagulants: Secondary | ICD-10-CM | POA: Diagnosis not present

## 2015-11-21 DIAGNOSIS — I4891 Unspecified atrial fibrillation: Secondary | ICD-10-CM | POA: Diagnosis not present

## 2015-11-21 LAB — PROTIME-INR: INR: 2 — AB (ref 0.9–1.1)

## 2015-11-21 NOTE — Addendum Note (Signed)
Addended by: Clyde Lundborg A on: 11/21/2015 02:01 PM   Modules accepted: Orders

## 2015-11-22 ENCOUNTER — Telehealth: Payer: Self-pay | Admitting: Family Medicine

## 2015-11-22 NOTE — Telephone Encounter (Signed)
Returned pt call and pt transferrred to scheduling.

## 2015-11-22 NOTE — Telephone Encounter (Signed)
She has been waiting for a telephone call with instructions.  She would like for you to return her telephone call.

## 2015-11-23 ENCOUNTER — Encounter: Payer: Self-pay | Admitting: Physician Assistant

## 2015-11-23 DIAGNOSIS — R262 Difficulty in walking, not elsewhere classified: Secondary | ICD-10-CM | POA: Diagnosis not present

## 2015-11-23 DIAGNOSIS — R2681 Unsteadiness on feet: Secondary | ICD-10-CM | POA: Diagnosis not present

## 2015-11-23 DIAGNOSIS — M6281 Muscle weakness (generalized): Secondary | ICD-10-CM | POA: Diagnosis not present

## 2015-11-26 DIAGNOSIS — R2681 Unsteadiness on feet: Secondary | ICD-10-CM | POA: Diagnosis not present

## 2015-11-26 DIAGNOSIS — R262 Difficulty in walking, not elsewhere classified: Secondary | ICD-10-CM | POA: Diagnosis not present

## 2015-11-26 DIAGNOSIS — M6281 Muscle weakness (generalized): Secondary | ICD-10-CM | POA: Diagnosis not present

## 2015-11-28 ENCOUNTER — Other Ambulatory Visit (INDEPENDENT_AMBULATORY_CARE_PROVIDER_SITE_OTHER): Payer: Medicare Other

## 2015-11-28 ENCOUNTER — Other Ambulatory Visit: Payer: Self-pay | Admitting: Family Medicine

## 2015-11-28 DIAGNOSIS — D509 Iron deficiency anemia, unspecified: Secondary | ICD-10-CM

## 2015-11-28 LAB — CBC
HEMATOCRIT: 33.4 % — AB (ref 36.0–46.0)
HEMOGLOBIN: 10.2 g/dL — AB (ref 12.0–15.0)
MCHC: 30.6 g/dL (ref 30.0–36.0)
MCV: 68.8 fl — ABNORMAL LOW (ref 78.0–100.0)
Platelets: 418 10*3/uL — ABNORMAL HIGH (ref 150.0–400.0)
RBC: 4.85 Mil/uL (ref 3.87–5.11)
RDW: 19.2 % — ABNORMAL HIGH (ref 11.5–15.5)
WBC: 10.9 10*3/uL — AB (ref 4.0–10.5)

## 2015-11-28 LAB — FERRITIN: Ferritin: 8.4 ng/mL — ABNORMAL LOW (ref 10.0–291.0)

## 2015-11-28 MED ORDER — FERROUS SULFATE 325 (65 FE) MG PO TABS: 325.0000 mg | ORAL_TABLET | Freq: Two times a day (BID) | ORAL | Status: DC

## 2015-11-29 DIAGNOSIS — M6281 Muscle weakness (generalized): Secondary | ICD-10-CM | POA: Diagnosis not present

## 2015-11-29 DIAGNOSIS — R262 Difficulty in walking, not elsewhere classified: Secondary | ICD-10-CM | POA: Diagnosis not present

## 2015-11-29 DIAGNOSIS — R2681 Unsteadiness on feet: Secondary | ICD-10-CM | POA: Diagnosis not present

## 2015-11-29 LAB — IRON AND TIBC
%SAT: 4 % — AB (ref 11–50)
Iron: 18 ug/dL — ABNORMAL LOW (ref 45–160)
TIBC: 459 ug/dL — ABNORMAL HIGH (ref 250–450)
UIBC: 441 ug/dL — AB (ref 125–400)

## 2015-11-30 DIAGNOSIS — R2681 Unsteadiness on feet: Secondary | ICD-10-CM | POA: Diagnosis not present

## 2015-11-30 DIAGNOSIS — M6281 Muscle weakness (generalized): Secondary | ICD-10-CM | POA: Diagnosis not present

## 2015-11-30 DIAGNOSIS — R262 Difficulty in walking, not elsewhere classified: Secondary | ICD-10-CM | POA: Diagnosis not present

## 2015-12-05 DIAGNOSIS — F4001 Agoraphobia with panic disorder: Secondary | ICD-10-CM | POA: Diagnosis not present

## 2015-12-06 DIAGNOSIS — R262 Difficulty in walking, not elsewhere classified: Secondary | ICD-10-CM | POA: Diagnosis not present

## 2015-12-06 DIAGNOSIS — R2681 Unsteadiness on feet: Secondary | ICD-10-CM | POA: Diagnosis not present

## 2015-12-06 DIAGNOSIS — M6281 Muscle weakness (generalized): Secondary | ICD-10-CM | POA: Diagnosis not present

## 2015-12-08 ENCOUNTER — Encounter: Payer: Self-pay | Admitting: *Deleted

## 2015-12-12 ENCOUNTER — Ambulatory Visit (INDEPENDENT_AMBULATORY_CARE_PROVIDER_SITE_OTHER): Payer: Private Health Insurance - Indemnity | Admitting: Cardiology

## 2015-12-12 DIAGNOSIS — I482 Chronic atrial fibrillation, unspecified: Secondary | ICD-10-CM

## 2015-12-12 DIAGNOSIS — I4891 Unspecified atrial fibrillation: Secondary | ICD-10-CM | POA: Diagnosis not present

## 2015-12-12 DIAGNOSIS — Z7901 Long term (current) use of anticoagulants: Secondary | ICD-10-CM | POA: Diagnosis not present

## 2015-12-12 LAB — PROTIME-INR: INR: 2 — AB (ref 0.9–1.1)

## 2015-12-13 DIAGNOSIS — M6281 Muscle weakness (generalized): Secondary | ICD-10-CM | POA: Diagnosis not present

## 2015-12-13 DIAGNOSIS — R262 Difficulty in walking, not elsewhere classified: Secondary | ICD-10-CM | POA: Diagnosis not present

## 2015-12-13 DIAGNOSIS — R2681 Unsteadiness on feet: Secondary | ICD-10-CM | POA: Diagnosis not present

## 2015-12-14 ENCOUNTER — Encounter: Payer: Self-pay | Admitting: Physician Assistant

## 2015-12-14 ENCOUNTER — Telehealth: Payer: Self-pay | Admitting: *Deleted

## 2015-12-14 ENCOUNTER — Ambulatory Visit (INDEPENDENT_AMBULATORY_CARE_PROVIDER_SITE_OTHER): Payer: Medicare Other | Admitting: Physician Assistant

## 2015-12-14 ENCOUNTER — Other Ambulatory Visit (INDEPENDENT_AMBULATORY_CARE_PROVIDER_SITE_OTHER): Payer: Medicare Other

## 2015-12-14 VITALS — BP 140/74 | HR 76 | Ht 67.0 in | Wt 218.4 lb

## 2015-12-14 DIAGNOSIS — R1013 Epigastric pain: Secondary | ICD-10-CM

## 2015-12-14 DIAGNOSIS — R1012 Left upper quadrant pain: Secondary | ICD-10-CM

## 2015-12-14 DIAGNOSIS — R1010 Upper abdominal pain, unspecified: Secondary | ICD-10-CM

## 2015-12-14 DIAGNOSIS — D509 Iron deficiency anemia, unspecified: Secondary | ICD-10-CM

## 2015-12-14 DIAGNOSIS — R1011 Right upper quadrant pain: Secondary | ICD-10-CM

## 2015-12-14 DIAGNOSIS — G8929 Other chronic pain: Secondary | ICD-10-CM

## 2015-12-14 LAB — CBC WITH DIFFERENTIAL/PLATELET
Basophils Absolute: 0 10*3/uL (ref 0.0–0.1)
Basophils Relative: 0.3 % (ref 0.0–3.0)
EOS PCT: 0.6 % (ref 0.0–5.0)
Eosinophils Absolute: 0.1 10*3/uL (ref 0.0–0.7)
HCT: 35.1 % — ABNORMAL LOW (ref 36.0–46.0)
Hemoglobin: 10.6 g/dL — ABNORMAL LOW (ref 12.0–15.0)
LYMPHS ABS: 0.9 10*3/uL (ref 0.7–4.0)
Lymphocytes Relative: 8.2 % — ABNORMAL LOW (ref 12.0–46.0)
MCHC: 30.3 g/dL (ref 30.0–36.0)
MONO ABS: 0.9 10*3/uL (ref 0.1–1.0)
MONOS PCT: 8 % (ref 3.0–12.0)
NEUTROS ABS: 8.8 10*3/uL — AB (ref 1.4–7.7)
NEUTROS PCT: 82.9 % — AB (ref 43.0–77.0)
PLATELETS: 395 10*3/uL (ref 150.0–400.0)
RBC: 5.04 Mil/uL (ref 3.87–5.11)
RDW: 19.5 % — ABNORMAL HIGH (ref 11.5–15.5)
WBC: 10.6 10*3/uL — ABNORMAL HIGH (ref 4.0–10.5)

## 2015-12-14 MED ORDER — NA SULFATE-K SULFATE-MG SULF 17.5-3.13-1.6 GM/177ML PO SOLN: 1.0000 | Freq: Once | ORAL | Status: AC

## 2015-12-14 NOTE — Telephone Encounter (Signed)
She may stop Coumadin for 5 days prior to endoscopy. She no longer needs to be on Plavix anyway.  Manley Fason [name] MD, [facility]

## 2015-12-14 NOTE — Progress Notes (Signed)
Patient ID: [name], female   DOB: 1933/09/01, 81 y.o.   MRN: [id number]   Subjective:    Patient ID: [name], female    DOB: [birth year], 81 y.o.   MRN: [id number]  HPI  [name] is a pleasant 81 year old white female new to GI today referred by Dr. Yong Ware for new finding of iron deficiency anemia. Asian had labs done in October 2015 showing hemoglobin of 12.1 hematocrit of 36 MCV of 76 labs in November 2016 hemoglobin 10.2 hematocrit of 33.4 MCV of 68, INR of 2.0, serum iron 18, TIBC 459 and iron saturation of 4. Patient has been symptomatic with fatigue over the past 3-4 months. She has also had some upper abdominal discomfort which she is attributing to her hiatal hernia "acting up". This is been fairly persistent over the past couple of months her appetite has been somewhat decreased and her weight is down a few pounds. She is also had some intermittent vague nausea no vomiting. No heartburn or indigestion no dysphagia or odynophagia. She has been taking Prilosec 20 mg by mouth daily on a regular basis. She denies any changes with her bowel habits no melena or hematochezia that she's been aware of. She is on chronic Coumadin for history of atrial fibrillation, followed by Dr. [name]. Other medical problems include coronary artery disease status post CABG and remote stents, adult-onset diabetes mellitus hypertension and hyperlipidemia. Patient did have colonoscopy in September 2013 per Dr. Phillis Ware him with finding of a 10 mm rectal polyp which was a tubular adenoma otherwise negative exam and had EGD at that same time with a reactive gastropathy.  Review of Systems Pertinent positive and negative review of systems were noted in the above HPI section.  All other review of systems was otherwise negative.  Outpatient Encounter Prescriptions as of 12/14/2015  Medication Sig  . clobetasol cream (TEMOVATE) 0.05 % Apply nightly as needed for irritation  . ferrous sulfate 325 (65 FE) MG tablet  Take 1 tablet (325 mg total) by mouth 2 (two) times daily with a meal.  . FLUoxetine (PROZAC) 20 MG tablet Take 20 mg by mouth 3 (three) times a week.   . Loratadine 10 MG CAPS Take 1 capsule (10 mg total) by mouth daily.  [name] losartan (COZAAR) 50 MG tablet Take 1 tablet (50 mg total) by mouth 2 (two) times daily.  . metFORMIN (GLUCOPHAGE) 500 MG tablet TAKE 1 TABLET (500 MG TOTAL) BY MOUTH DAILY WITH BREAKFAST.  . metoprolol succinate (TOPROL-XL) 25 MG 24 hr tablet TAKE 1 TABLET(S) BY MOUTH DAILY  . nitroGLYCERIN (NITROSTAT) 0.4 MG SL tablet Place 1 tablet (0.4 mg total) under the tongue every 5 (five) minutes as needed. For chest pain  . omeprazole (PRILOSEC OTC) 20 MG tablet Take 20 mg by mouth daily.   [name] warfarin (COUMADIN) 5 MG tablet Take as directed by coumadin clinic  . Na Sulfate-K Sulfate-Mg Sulf SOLN Take 1 kit by mouth once.  . [DISCONTINUED] clopidogrel (PLAVIX) 75 MG tablet Take 1 tablet (75 mg total) by mouth daily.   No facility-administered encounter medications on file as of 12/14/2015.   Allergies  Allergen Reactions  . Statins Other (See Comments)    Muscle soreness  . Sulfa Drugs Cross Reactors Itching  . Zetia [Ezetimibe] Other (See Comments)    Muscle soreness  . Ace Inhibitors     Intolerance per Dr. Doug Ware note  . Amlodipine     Intolerance per Dr. Doug Ware note   .  Fenofibrate Other (See Comments)    Muscle soreness   Patient Active Problem List   Diagnosis Date Noted  . Allergic rhinitis 11/19/2015  . Non-proliferative diabetic retinopathy, mild, left eye ([place]) 10/10/2015  . Carotid artery stenosis, asymptomatic 07/06/2015  . Carotid stenosis 07/06/2015  . Edema leg 05/26/2015  . Depression 03/25/2015  . GERD (gastroesophageal reflux disease) 03/25/2015  . Spinal stenosis   . Atrial fibrillation ([place]) 01/08/2014  . Long term current use of anticoagulant therapy 01/08/2014  . Diastolic CHF, chronic ([place]) 01/02/2014  . Type II diabetes mellitus with  ophthalmic manifestations ([place]) 01/02/2014  . Coronary artery disease   . Lichen sclerosus   . Carotid artery stenosis 04/01/2012  . HTN (hypertension) 08/29/2011  . Obesities, morbid ([place]) 07/18/2011  . Hyperlipidemia 07/18/2011   Social History   Social History  . Marital Status: Widowed    Spouse Name: N/A  . Number of Children: 3  . Years of Education: N/A   Occupational History  . Not on file.   Social History Main Topics  . Smoking status: Former Smoker -- 0.20 packs/day for 20 years    Types: Cigarettes    Quit date: 12/31/1998  . Smokeless tobacco: Never Used  . Alcohol Use: No  . Drug Use: No  . Sexual Activity: No     Comment: 1st intercourse 81 yo-Fewer than 5 partners   Other Topics Concern  . Not on file   Social History Narrative   Lives at [facility]. Married and husband is going to be a patient of Dr. Yong Ware. 3 children. 3 grandkids. No greatgrandkids yet.    Husband 34 in 2016. Moved from 5 bedroom home.      Retired Education officer, museum. Husband was a Engineer, maintenance (IT).       Hobbies: plays bridge, gardening club, church work, Psychologist, occupational          [name]'s family history includes Heart attack (age of onset: 20) in her father; Heart disease in her father and mother; Heart failure in her mother; Varicose Veins in her mother. There is no history of Colon cancer.      Objective:    Filed Vitals:   12/14/15 1006  BP: 140/74  Pulse: 76    Physical Exam    before pulse 76 height 5 foot 7 weight 218. HEENT ;nontraumatic cephalic EOMI PERRLA sclera anicteric, Neck ;supple, Cardiovascular; irregular rate and rhythm with S1-S2 no murmur or gallop, Pulmonary ;clear bilaterally, Abdomen; soft she has some mild tenderness across the upper abdomen there is no guarding or rebound no palpable mass or hepatosplenomegaly bowel sounds are present, Rectal; exam; not done, Ext; no clubbing cyanosis or edema skin warm and dry, Neuropsych; mood and affect appropriate        Assessment & Plan:   #1  81 yo female with  Fe deficiency anemia,and couple month hx of upper abdominal  dscomfort, decreased appetite  R/O upper vs lower GI neoplasm, PUD,AVM;s #2 Chronic anticoagulation -on Coumadin #3 Atrial Fib #4 CAD-s/p CABG #5 HTN #6 hyperlipidemia  Plan; repeat CBC today Schedule for Colonoscopy and EGD with Dr Fuller Plan. Procedures discussed in detail with pt and she is agreeable to proceed, We wll communicate with Dr [name] regarding holding Coumadin for 5 days prior to procedure and assure this is reasonable for this pt. If negative will need capsule endoscopy She will continue orla iron which was just started Continue prilosec 20 mg po daily    Amy S Esterwood PA-C  12/14/2015   Cc: [name], MD

## 2015-12-14 NOTE — Progress Notes (Signed)
Reviewed and agree with management plan.  Gurshan Settlemire T. Treyvion Durkee, MD FACG 

## 2015-12-14 NOTE — Patient Instructions (Addendum)
Please go to the basement level to have your labs drawn.  Continue the oral iron supplement.  Take the Prilosec 20 mg, 1 tablet in the morning daily.   We sent the prescription for the Suprep for the colonoscopy to [facility], [facility].   We will call you when we hear back from Dr. [name] regarding the Coumadin instructions.

## 2015-12-14 NOTE — Telephone Encounter (Signed)
12/14/2015   RE: [name] DOB: [birth year] MRN: [id number]   Dear Dr. Peter [name],    We have scheduled the above patient for an endoscopic procedure. Our records show that she is on anticoagulation therapy.   Please advise as to how long the patient may come off her therapy of Coumadin prior to the procedure, which is scheduled for 01-17-2016.  Please fax back/ or route the completed form to [facility] at [phone].   Sincerely,    Amy Esterwood Pa

## 2015-12-15 ENCOUNTER — Telehealth: Payer: Self-pay | Admitting: Cardiology

## 2015-12-15 DIAGNOSIS — R2681 Unsteadiness on feet: Secondary | ICD-10-CM | POA: Diagnosis not present

## 2015-12-15 DIAGNOSIS — M6281 Muscle weakness (generalized): Secondary | ICD-10-CM | POA: Diagnosis not present

## 2015-12-15 DIAGNOSIS — R262 Difficulty in walking, not elsewhere classified: Secondary | ICD-10-CM | POA: Diagnosis not present

## 2015-12-15 NOTE — Telephone Encounter (Signed)
Follow Up ° ° ° ° °Pt is returning call from earlier. Please call. °

## 2015-12-15 NOTE — Telephone Encounter (Signed)
See previous 12/15/15 note.

## 2015-12-15 NOTE — Telephone Encounter (Signed)
Returned call to patient.Dr.Jordan advised ok to hold coumadin 5 days prior to endoscopy.

## 2015-12-19 DIAGNOSIS — M6281 Muscle weakness (generalized): Secondary | ICD-10-CM | POA: Diagnosis not present

## 2015-12-19 DIAGNOSIS — R2681 Unsteadiness on feet: Secondary | ICD-10-CM | POA: Diagnosis not present

## 2015-12-19 DIAGNOSIS — R262 Difficulty in walking, not elsewhere classified: Secondary | ICD-10-CM | POA: Diagnosis not present

## 2015-12-20 ENCOUNTER — Encounter: Payer: Self-pay | Admitting: Family Medicine

## 2015-12-20 ENCOUNTER — Ambulatory Visit (INDEPENDENT_AMBULATORY_CARE_PROVIDER_SITE_OTHER): Payer: Medicare Other | Admitting: Family Medicine

## 2015-12-20 VITALS — BP 140/72 | HR 101 | Temp 99.2°F | Wt 224.0 lb

## 2015-12-20 DIAGNOSIS — J329 Chronic sinusitis, unspecified: Secondary | ICD-10-CM | POA: Diagnosis not present

## 2015-12-20 DIAGNOSIS — A499 Bacterial infection, unspecified: Secondary | ICD-10-CM | POA: Diagnosis not present

## 2015-12-20 DIAGNOSIS — B9689 Other specified bacterial agents as the cause of diseases classified elsewhere: Secondary | ICD-10-CM

## 2015-12-20 DIAGNOSIS — J40 Bronchitis, not specified as acute or chronic: Secondary | ICD-10-CM

## 2015-12-20 MED ORDER — GUAIFENESIN-CODEINE 100-10 MG/5ML PO SOLN: 5.0000 mL | Freq: Four times a day (QID) | ORAL | Status: DC | PRN

## 2015-12-20 MED ORDER — METHYLPREDNISOLONE ACETATE 80 MG/ML IJ SUSP
40.0000 mg | Freq: Once | INTRAMUSCULAR | Status: AC
  Administered 2015-12-20: 40 mg via INTRAMUSCULAR

## 2015-12-20 MED ORDER — AMOXICILLIN-POT CLAVULANATE 875-125 MG PO TABS: 1.0000 | ORAL_TABLET | Freq: Two times a day (BID) | ORAL | Status: DC

## 2015-12-20 MED ORDER — ALBUTEROL SULFATE HFA 108 (90 BASE) MCG/ACT IN AERS: 2.0000 | INHALATION_SPRAY | Freq: Four times a day (QID) | RESPIRATORY_TRACT | Status: DC | PRN

## 2015-12-20 NOTE — Progress Notes (Signed)
PCP: [name], MD  Subjective:  [name] is a 81 y.o. year old very pleasant female patient who presents with concern for bronchitis/ sinusitis.  Patient had allergic symptoms over a month ago and started on claritin. Helped some but symptoms persisted also including some nasal congestion, sinus tenderness. Over last 5 days symptoms have worsened- progressive cough with clear or white sputum. Shortness of breath. Notes occasional wheeze. Hears coarse sounds from her chest.  -day of illness:5 for more severe symptoms but lingering symptoms over a month -Symptoms are worsening -previous treatments: rest, hydrationclaritin -sick contacts/travel/risks: denies flu exposure.  -Hx of: allergies  ROS-denies fever (perhaps subjective but does not have thermometer at home), chest pain, NVD, tooth pain  Pertinent Past Medical History-  Patient Active Problem List   Diagnosis Date Noted  . Non-proliferative diabetic retinopathy, mild, left eye ([place]) 10/10/2015    Priority: High  . Atrial fibrillation ([place]) 01/08/2014    Priority: High  . Diastolic CHF, chronic ([place]) 01/02/2014    Priority: High  . Type II diabetes mellitus with ophthalmic manifestations ([place]) 01/02/2014    Priority: High  . Coronary artery disease     Priority: High  . Carotid artery stenosis 04/01/2012    Priority: High  . Depression 03/25/2015    Priority: Medium  . HTN (hypertension) 08/29/2011    Priority: Medium  . Hyperlipidemia 07/18/2011    Priority: Medium  . Allergic rhinitis 11/19/2015    Priority: Low  . GERD (gastroesophageal reflux disease) 03/25/2015    Priority: Low  . Long term current use of anticoagulant therapy 01/08/2014    Priority: Low  . Lichen sclerosus     Priority: Low  . Obesities, morbid ([place]) 07/18/2011    Priority: Low  . Carotid artery stenosis, asymptomatic 07/06/2015  . Carotid stenosis 07/06/2015  . Edema leg 05/26/2015  . Spinal stenosis     Medications- reviewed   Current Outpatient Prescriptions  Medication Sig Dispense Refill  . clobetasol cream (TEMOVATE) 0.05 % Apply nightly as needed for irritation 60 g 1  . ferrous sulfate 325 (65 FE) MG tablet Take 1 tablet (325 mg total) by mouth 2 (two) times daily with a meal. 60 tablet 5  . FLUoxetine (PROZAC) 20 MG tablet Take 20 mg by mouth 3 (three) times a week.     . Loratadine 10 MG CAPS Take 1 capsule (10 mg total) by mouth daily. 30 each 5  . losartan (COZAAR) 50 MG tablet Take 1 tablet (50 mg total) by mouth 2 (two) times daily. 60 tablet 3  . metFORMIN (GLUCOPHAGE) 500 MG tablet TAKE 1 TABLET (500 MG TOTAL) BY MOUTH DAILY WITH BREAKFAST. 30 tablet 5  . metoprolol succinate (TOPROL-XL) 25 MG 24 hr tablet TAKE 1 TABLET(S) BY MOUTH DAILY 30 tablet 8  . Na Sulfate-K Sulfate-Mg Sulf SOLN Take 1 kit by mouth once. 354 mL 0  . omeprazole (PRILOSEC OTC) 20 MG tablet Take 20 mg by mouth daily.     [name] warfarin (COUMADIN) 5 MG tablet Take as directed by coumadin clinic 30 tablet 3  . albuterol (PROVENTIL HFA;VENTOLIN HFA) 108 (90 BASE) MCG/ACT inhaler Inhale 2 puffs into the lungs every 6 (six) hours as needed for wheezing or shortness of breath. 1 Inhaler 0  . amoxicillin-clavulanate (AUGMENTIN) 875-125 MG tablet Take 1 tablet by mouth 2 (two) times daily. 14 tablet 0  . guaiFENesin-codeine 100-10 MG/5ML syrup Take 5 mLs by mouth every 6 (six) hours as needed for  cough. 120 mL 0  . nitroGLYCERIN (NITROSTAT) 0.4 MG SL tablet Place 1 tablet (0.4 mg total) under the tongue every 5 (five) minutes as needed. For chest pain (Patient not taking: Reported on 12/20/2015) 25 tablet 2   Current Facility-Administered Medications  Medication Dose Route Frequency Provider Last Rate Last Dose  . methylPREDNISolone acetate (DEPO-MEDROL) injection 40 mg  40 mg Intramuscular Once [name], MD        Objective: BP 140/72 mmHg  Pulse 101  Temp(Src) 99.2 F (37.3 C)  Wt 224 lb (101.606 kg)  SpO2 94% Gen: NAD,  resting comfortably HEENT: Turbinates erythematous with cleardrainage, TM normal, pharynx mildly erythematous with no tonsilar exudate or edema, minimal maxillary sinus tenderness CV: RRR no murmurs rubs or gallops Lungs: Diffuse rhonchi. Occasional wheeze. No crackles.  Abdomen: soft/nontender/No rebound or guarding.  Ext: chronic 1+ edema (not taking lasix) Skin: warm, dry, no rash Neuro: grossly normal, moves all extremities  Assessment/Plan:  Bronchitis with possible lingering Sinsusitis Chronic monthlong symptoms worrisome for sinusitis- hopeful augmentin will help or would cover if possible bacterial component to bronchitis (which is unfortunately largely viral)  For bronchitis also treat with 86m of depo medrol, albuterol for wheezing and shortness of breath and codeine cough syrup to help her rest. Discussed may take 4-6 weeks to clear but hopeful for improvement within 2 weeks. If any bacterial component may improve earlier. With diasotlic CHF though I heard no crackles- advised daily use of lasix during this time. Weight has trended up over last month so I am concerned this is contributing but prominent rhonchi definitely point towards bronchitis. Patient is high risk with diastolic CHF, CAD, a fib, diabetes so being rather aggressive with care.   Finally, we reviewed reasons to return to care including if symptoms worsen or persist or new concerns arise. Also see avs  Meds ordered this encounter  Medications  . guaiFENesin-codeine 100-10 MG/5ML syrup    Sig: Take 5 mLs by mouth every 6 (six) hours as needed for cough.    Dispense:  120 mL    Refill:  0  . albuterol (PROVENTIL HFA;VENTOLIN HFA) 108 (90 BASE) MCG/ACT inhaler    Sig: Inhale 2 puffs into the lungs every 6 (six) hours as needed for wheezing or shortness of breath.    Dispense:  1 Inhaler    Refill:  0  . amoxicillin-clavulanate (AUGMENTIN) 875-125 MG tablet    Sig: Take 1 tablet by mouth 2 (two) times daily.     Dispense:  14 tablet    Refill:  0  . methylPREDNISolone acetate (DEPO-MEDROL) injection 40 mg    Sig:

## 2015-12-20 NOTE — Patient Instructions (Signed)
Primary issue here appears to be bronchitis  To give you the best chance possible before holidays Augmentin (though this is likely viral- ? Mild sinusitis though) Steroid shot before you go Codeine cough syrup - do not drive 8 hours after taking and do not take mucinex with this.  Albuterol every 6 hours to help with cough/wheeze  If you have new or worsening symptoms specifically fever or shortness of breath return to care. Lung sounded like bronchitis not pneumonia but may have to get chest x-ray if things get worse. If you get really short of breath- seek care at urgent care or in the emergency room.

## 2015-12-21 NOTE — Telephone Encounter (Signed)
Called patient to advise she can stop the Coumadin on 01-12-2016 and resume it the day after the colonoscopy on 01-18-2016.

## 2015-12-27 DIAGNOSIS — R262 Difficulty in walking, not elsewhere classified: Secondary | ICD-10-CM | POA: Diagnosis not present

## 2015-12-27 DIAGNOSIS — M6281 Muscle weakness (generalized): Secondary | ICD-10-CM | POA: Diagnosis not present

## 2015-12-27 DIAGNOSIS — R2681 Unsteadiness on feet: Secondary | ICD-10-CM | POA: Diagnosis not present

## 2015-12-29 ENCOUNTER — Ambulatory Visit: Payer: Medicare Other | Admitting: Podiatry

## 2015-12-29 ENCOUNTER — Encounter: Payer: Self-pay | Admitting: Podiatry

## 2015-12-29 ENCOUNTER — Ambulatory Visit (INDEPENDENT_AMBULATORY_CARE_PROVIDER_SITE_OTHER): Payer: Medicare Other | Admitting: Podiatry

## 2015-12-29 DIAGNOSIS — M79676 Pain in unspecified toe(s): Secondary | ICD-10-CM

## 2015-12-29 DIAGNOSIS — B351 Tinea unguium: Secondary | ICD-10-CM | POA: Diagnosis not present

## 2015-12-29 DIAGNOSIS — M79674 Pain in right toe(s): Secondary | ICD-10-CM | POA: Diagnosis not present

## 2015-12-29 DIAGNOSIS — M79675 Pain in left toe(s): Secondary | ICD-10-CM | POA: Diagnosis not present

## 2015-12-29 NOTE — Progress Notes (Signed)
Patient ID: [name], female   DOB: 03-May-1933, 81 y.o.   MRN: [id number] Complaint:  Visit Type: Patient returns to my office for continued preventative foot care services. Complaint: Patient states" my nails have grown long and thick and become painful to walk and wear shoes. The patient presents for preventative foot care services. No changes to ROS  Podiatric Exam: Vascular: dorsalis pedis and posterior tibial pulses are palpable bilateral. Capillary return is immediate. Temperature gradient is WNL. Skin turgor WNL  Sensorium: Normal Semmes Weinstein monofilament test. Normal tactile sensation bilaterally. Nail Exam: Pt has thick disfigured discolored nails with subungual debris noted bilateral entire nail hallux through fifth toenails Ulcer Exam: There is no evidence of ulcer or pre-ulcerative changes or infection. Orthopedic Exam: Muscle tone and strength are WNL. No limitations in general ROM. No crepitus or effusions noted. Foot type and digits show no abnormalities. Bony prominences are unremarkable. Skin: No Porokeratosis. No infection or ulcers  Diagnosis:  Onychomycosis, , Pain in right toe, pain in left toes  Treatment & Plan Procedures and Treatment: Consent by patient was obtained for treatment procedures. The patient understood the discussion of treatment and procedures well. All questions were answered thoroughly reviewed. Debridement of mycotic and hypertrophic toenails, 1 through 5 bilateral and clearing of subungual debris. No ulceration, no infection noted.  Return Visit-Office Procedure: Patient instructed to return to the office for a follow up visit 3 months for continued evaluation and treatment.  Gardiner Barefoot DPM

## 2015-12-30 DIAGNOSIS — R2681 Unsteadiness on feet: Secondary | ICD-10-CM | POA: Diagnosis not present

## 2015-12-30 DIAGNOSIS — R262 Difficulty in walking, not elsewhere classified: Secondary | ICD-10-CM | POA: Diagnosis not present

## 2015-12-30 DIAGNOSIS — M6281 Muscle weakness (generalized): Secondary | ICD-10-CM | POA: Diagnosis not present

## 2016-01-03 DIAGNOSIS — R2681 Unsteadiness on feet: Secondary | ICD-10-CM | POA: Diagnosis not present

## 2016-01-03 DIAGNOSIS — M6281 Muscle weakness (generalized): Secondary | ICD-10-CM | POA: Diagnosis not present

## 2016-01-03 DIAGNOSIS — R262 Difficulty in walking, not elsewhere classified: Secondary | ICD-10-CM | POA: Diagnosis not present

## 2016-01-06 DIAGNOSIS — M6281 Muscle weakness (generalized): Secondary | ICD-10-CM | POA: Diagnosis not present

## 2016-01-06 DIAGNOSIS — R262 Difficulty in walking, not elsewhere classified: Secondary | ICD-10-CM | POA: Diagnosis not present

## 2016-01-06 DIAGNOSIS — R2681 Unsteadiness on feet: Secondary | ICD-10-CM | POA: Diagnosis not present

## 2016-01-10 DIAGNOSIS — R262 Difficulty in walking, not elsewhere classified: Secondary | ICD-10-CM | POA: Diagnosis not present

## 2016-01-10 DIAGNOSIS — M6281 Muscle weakness (generalized): Secondary | ICD-10-CM | POA: Diagnosis not present

## 2016-01-10 DIAGNOSIS — R2681 Unsteadiness on feet: Secondary | ICD-10-CM | POA: Diagnosis not present

## 2016-01-12 ENCOUNTER — Ambulatory Visit (INDEPENDENT_AMBULATORY_CARE_PROVIDER_SITE_OTHER): Payer: Managed Care, Other (non HMO) | Admitting: Cardiovascular Disease

## 2016-01-12 DIAGNOSIS — I482 Chronic atrial fibrillation, unspecified: Secondary | ICD-10-CM

## 2016-01-12 DIAGNOSIS — Z7901 Long term (current) use of anticoagulants: Secondary | ICD-10-CM | POA: Diagnosis not present

## 2016-01-12 DIAGNOSIS — I4891 Unspecified atrial fibrillation: Secondary | ICD-10-CM | POA: Diagnosis not present

## 2016-01-12 LAB — PROTIME-INR: INR: 2 — AB (ref 0.9–1.1)

## 2016-01-13 ENCOUNTER — Other Ambulatory Visit: Payer: Self-pay | Admitting: Cardiology

## 2016-01-13 DIAGNOSIS — M6281 Muscle weakness (generalized): Secondary | ICD-10-CM | POA: Diagnosis not present

## 2016-01-13 DIAGNOSIS — R262 Difficulty in walking, not elsewhere classified: Secondary | ICD-10-CM | POA: Diagnosis not present

## 2016-01-13 DIAGNOSIS — R2681 Unsteadiness on feet: Secondary | ICD-10-CM | POA: Diagnosis not present

## 2016-01-17 ENCOUNTER — Ambulatory Visit (AMBULATORY_SURGERY_CENTER): Payer: Medicare Other | Admitting: Gastroenterology

## 2016-01-17 ENCOUNTER — Encounter: Payer: Self-pay | Admitting: Gastroenterology

## 2016-01-17 ENCOUNTER — Telehealth: Payer: Self-pay | Admitting: *Deleted

## 2016-01-17 VITALS — BP 109/56 | HR 75 | Temp 97.1°F | Resp 19 | Ht 67.0 in | Wt 218.0 lb

## 2016-01-17 DIAGNOSIS — D509 Iron deficiency anemia, unspecified: Secondary | ICD-10-CM

## 2016-01-17 DIAGNOSIS — G8929 Other chronic pain: Secondary | ICD-10-CM

## 2016-01-17 DIAGNOSIS — F329 Major depressive disorder, single episode, unspecified: Secondary | ICD-10-CM | POA: Diagnosis not present

## 2016-01-17 DIAGNOSIS — R1013 Epigastric pain: Secondary | ICD-10-CM | POA: Diagnosis not present

## 2016-01-17 DIAGNOSIS — D123 Benign neoplasm of transverse colon: Secondary | ICD-10-CM | POA: Diagnosis not present

## 2016-01-17 DIAGNOSIS — R109 Unspecified abdominal pain: Secondary | ICD-10-CM | POA: Diagnosis not present

## 2016-01-17 DIAGNOSIS — D120 Benign neoplasm of cecum: Secondary | ICD-10-CM

## 2016-01-17 DIAGNOSIS — I509 Heart failure, unspecified: Secondary | ICD-10-CM | POA: Diagnosis not present

## 2016-01-17 DIAGNOSIS — Z86010 Personal history of colonic polyps: Secondary | ICD-10-CM | POA: Diagnosis not present

## 2016-01-17 DIAGNOSIS — D125 Benign neoplasm of sigmoid colon: Secondary | ICD-10-CM | POA: Diagnosis not present

## 2016-01-17 DIAGNOSIS — I10 Essential (primary) hypertension: Secondary | ICD-10-CM | POA: Diagnosis not present

## 2016-01-17 DIAGNOSIS — D122 Benign neoplasm of ascending colon: Secondary | ICD-10-CM

## 2016-01-17 DIAGNOSIS — K219 Gastro-esophageal reflux disease without esophagitis: Secondary | ICD-10-CM | POA: Diagnosis not present

## 2016-01-17 DIAGNOSIS — I4891 Unspecified atrial fibrillation: Secondary | ICD-10-CM | POA: Diagnosis not present

## 2016-01-17 DIAGNOSIS — I2510 Atherosclerotic heart disease of native coronary artery without angina pectoris: Secondary | ICD-10-CM | POA: Diagnosis not present

## 2016-01-17 DIAGNOSIS — E119 Type 2 diabetes mellitus without complications: Secondary | ICD-10-CM | POA: Diagnosis not present

## 2016-01-17 LAB — GLUCOSE, CAPILLARY: GLUCOSE-CAPILLARY: 118 mg/dL — AB (ref 65–99)

## 2016-01-17 MED ORDER — OMEPRAZOLE 20 MG PO CPDR
20.0000 mg | DELAYED_RELEASE_CAPSULE | Freq: Every day | ORAL | Status: DC
Start: 2016-01-17 — End: 2016-12-04

## 2016-01-17 MED ORDER — SODIUM CHLORIDE 0.9 % IV SOLN: 500.0000 mL | INTRAVENOUS | Status: DC

## 2016-01-17 NOTE — Op Note (Signed)
[place]  [address] [place], [id number]   COLONOSCOPY PROCEDURE REPORT  PATIENT: [name], [name]  MR#: [id number] BIRTHDATE: 09-22-1933 , 82  yrs. old GENDER: female ENDOSCOPIST: Ladene Artist, MD, [facility] Regal REFERRED BY:  [name], M.D. PROCEDURE DATE:  01/17/2016 PROCEDURE:   Colonoscopy, diagnostic, Colonoscopy with biopsy, and Colonoscopy with snare polypectomy First Screening Colonoscopy - Avg.  risk and is 50 yrs.  old or older - No.  Prior Negative Screening - Now for repeat screening. N/A  History of Adenoma - Now for follow-up colonoscopy & has been > or = to 3 yrs.  Yes hx of adenoma.  Has been 3 or more years since last colonoscopy.  History of Adenoma - Now for follow-up colonoscopy & has been > or = to 3 yrs.  Polyps removed today? Yes ASA CLASS:   Class III INDICATIONS:Unexplained iron deficiency anemia, Surveillance due to prior colonic neoplasia, and PH Colon Adenoma. MEDICATIONS: Monitored anesthesia care and Propofol 200 mg IV DESCRIPTION OF PROCEDURE:   After the risks benefits and alternatives of the procedure were thoroughly explained, informed consent was obtained.  The digital rectal exam revealed no abnormalities of the rectum.   The LB [id number]  endoscope was introduced through the anus and advanced to the cecum, which was identified by both the appendix and ileocecal valve. No adverse events experienced.   The quality of the prep was good.  (Suprep was used)  The instrument was then slowly withdrawn as the colon was fully examined. Estimated blood loss is zero unless otherwise noted in this procedure report.    COLON FINDINGS: Four sessile polyps measuring 6-7 mm in size were found in the sigmoid colon (3) and ascending colon (1). Polypectomies were performed with a cold snare.  The resection was complete, the polyp tissue was completely retrieved and sent to histology.   A sessile polyp measuring 4 mm in size was  found at the cecum.  A polypectomy was performed with cold forceps.  The resection was complete, the polyp tissue was completely retrieved and sent to histology.  There was moderate diverticulosis noted in the sigmoid colon with associated luminal narrowing and muscular hypertrophy.  An arteriovenous malformation measuring 35mm in size was found at the cecum.   The examination was otherwise normal. Retroflexed views revealed no abnormalities. The time to cecum = 3.9 Withdrawal time = 13.6   The scope was withdrawn and the procedure completed.  COMPLICATIONS: There were no immediate complications.   ENDOSCOPIC IMPRESSION: 1.   Four sessile polyps in the sigmoid colon and ascending colon; polypectomies performed with a cold snare 2.   Sessile polyp at the cecum; polypectomy performed with cold forceps 3.   Moderate diverticulosis in the sigmoid colon 4.   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  Await pathology results 2.  Hold Aspirin and all other NSAIDS for 2 weeks. 3.  Resume Coumadin (warfarin) tomorrow and have your PT/INR checked within 1 week. 4.  Given your age, you will not need another colonoscopy for colon cancer screening or polyp surveillance.  These types of tests usually stop around the age 30. 5.  Continue Fe replacement and follow up with PCP. 6.  Anticipate recurrent iron deficiency with potential for chronic losses from AVM  eSigned:  Ladene Artist, MD, [facility] 01/17/2016 3:13 PM      PATIENT NAME:  [name], [name] MR#: [id number]

## 2016-01-17 NOTE — Progress Notes (Signed)
Called to room to assist during endoscopic procedure.  Patient ID and intended procedure confirmed with present staff. Received instructions for my participation in the procedure from the performing physician.  

## 2016-01-17 NOTE — Op Note (Signed)
[place]  [address]. [place], [id number]   ENDOSCOPY PROCEDURE REPORT  PATIENT: [name], [name]  MR#: [id number] BIRTHDATE: Dec 15, 1933 , 82  yrs. old GENDER: female ENDOSCOPIST: Ladene Artist, MD, Marval Regal REFERRED BY:  [name], M.D. PROCEDURE DATE:  01/17/2016 PROCEDURE:  EGD, diagnostic ASA CLASS:     Class III INDICATIONS:  iron deficiency anemia and epigastric pain. MEDICATIONS: Monitored anesthesia care, Residual sedation present, and Propofol 100 mg IV TOPICAL ANESTHETIC: none DESCRIPTION OF PROCEDURE: After the risks benefits and alternatives of the procedure were thoroughly explained, informed consent was obtained.  The LB [id number] endoscope was introduced through the mouth and advanced to the second portion of the duodenum , Without limitations.  The instrument was slowly withdrawn as the mucosa was fully examined.    ESOPHAGUS: The mucosa of the esophagus appeared normal.  The esophagus was mildly tortuous. STOMACH: Multiple smooth sessile polyps ranging between 3-59mm in size were found in the gastric fundus and gastric body. Appearance typical for benign gastric fundic polyps and they were not biopsied as pt was resuming Coumadin.  The stomach otherwise appeared normal. DUODENUM: The duodenal mucosa showed no abnormalities in the bulb and 2nd part of the duodenum.  Retroflexed views revealed a 6-7 cm hiatal hernia.  The scope was then withdrawn from the patient and the procedure completed.  COMPLICATIONS: There were no immediate complications.  ENDOSCOPIC IMPRESSION: 1.   Multiple sessile polyps in the gastric fundus and gastric body 2.   6-7 cm hiatal hernia  RECOMMENDATIONS: 1.  Anti-reflux regimen long term 2.  Continue PPI qam long term  eSigned:  Ladene Artist, MD, [facility] 01/17/2016 3:18 PM

## 2016-01-17 NOTE — Progress Notes (Signed)
Report to PACU, RN, vss, BBS= Clear.  

## 2016-01-17 NOTE — Patient Instructions (Signed)

## 2016-01-17 NOTE — Telephone Encounter (Signed)
Call placed to pt. To offer her to come in early since doctor had cancellation,she stated that her daughter is coming from [place] and she will not be able to come in until she gets here.

## 2016-01-18 ENCOUNTER — Telehealth: Payer: Self-pay

## 2016-01-18 LAB — GLUCOSE, CAPILLARY: Glucose-Capillary: 106 mg/dL — ABNORMAL HIGH (ref 65–99)

## 2016-01-18 NOTE — Telephone Encounter (Signed)
  Follow up Call-  Call back number 01/17/2016  Post procedure Call Back phone  #  [phone]     Patient questions:  Do you have a fever, pain , or abdominal swelling? No. Pain Score  0 *  Have you tolerated food without any problems? Yes.    Have you been able to return to your normal activities? Yes.    Do you have any questions about your discharge instructions: Diet   No. Medications  No. Follow up visit  No.  Do you have questions or concerns about your Care? No.  Actions: * If pain score is 4 or above: No action needed, pain <4.

## 2016-01-26 ENCOUNTER — Encounter: Payer: Self-pay | Admitting: Gastroenterology

## 2016-01-26 ENCOUNTER — Ambulatory Visit (INDEPENDENT_AMBULATORY_CARE_PROVIDER_SITE_OTHER): Payer: Medicare Other | Admitting: Internal Medicine

## 2016-01-26 DIAGNOSIS — I4891 Unspecified atrial fibrillation: Secondary | ICD-10-CM | POA: Diagnosis not present

## 2016-01-26 DIAGNOSIS — I482 Chronic atrial fibrillation, unspecified: Secondary | ICD-10-CM

## 2016-01-26 DIAGNOSIS — Z7901 Long term (current) use of anticoagulants: Secondary | ICD-10-CM | POA: Diagnosis not present

## 2016-01-26 LAB — POCT INR: INR: 1.8

## 2016-02-09 ENCOUNTER — Ambulatory Visit (INDEPENDENT_AMBULATORY_CARE_PROVIDER_SITE_OTHER): Payer: Medicare Other

## 2016-02-09 DIAGNOSIS — B353 Tinea pedis: Secondary | ICD-10-CM | POA: Diagnosis not present

## 2016-02-09 DIAGNOSIS — I482 Chronic atrial fibrillation, unspecified: Secondary | ICD-10-CM

## 2016-02-09 DIAGNOSIS — I4891 Unspecified atrial fibrillation: Secondary | ICD-10-CM | POA: Diagnosis not present

## 2016-02-09 DIAGNOSIS — I8311 Varicose veins of right lower extremity with inflammation: Secondary | ICD-10-CM | POA: Diagnosis not present

## 2016-02-09 DIAGNOSIS — I872 Venous insufficiency (chronic) (peripheral): Secondary | ICD-10-CM | POA: Diagnosis not present

## 2016-02-09 DIAGNOSIS — B351 Tinea unguium: Secondary | ICD-10-CM | POA: Diagnosis not present

## 2016-02-09 DIAGNOSIS — Z7901 Long term (current) use of anticoagulants: Secondary | ICD-10-CM | POA: Diagnosis not present

## 2016-02-09 DIAGNOSIS — I8312 Varicose veins of left lower extremity with inflammation: Secondary | ICD-10-CM | POA: Diagnosis not present

## 2016-02-09 LAB — PROTIME-INR: INR: 2.9 — AB (ref 0.9–1.1)

## 2016-02-20 ENCOUNTER — Telehealth: Payer: Self-pay | Admitting: Family Medicine

## 2016-02-20 NOTE — Telephone Encounter (Signed)
Pt call to say that she can not take the following med ferrous sulfate 325 (65 FE) MG tablet   She said the med upsets her stomach and make her nausea

## 2016-02-20 NOTE — Telephone Encounter (Signed)
Thanks for update- we can discuss further at next visit

## 2016-02-20 NOTE — Telephone Encounter (Signed)
See below

## 2016-02-21 ENCOUNTER — Encounter: Payer: Self-pay | Admitting: Family

## 2016-02-21 ENCOUNTER — Ambulatory Visit (INDEPENDENT_AMBULATORY_CARE_PROVIDER_SITE_OTHER): Payer: Medicare Other | Admitting: Family

## 2016-02-21 ENCOUNTER — Ambulatory Visit (HOSPITAL_COMMUNITY)
Admission: RE | Admit: 2016-02-21 | Discharge: 2016-02-21 | Disposition: A | Discharge status: Home / Self Care | Payer: Medicare Other | Source: Ambulatory Visit | Attending: Family | Admitting: Family

## 2016-02-21 VITALS — BP 155/76 | HR 78 | Temp 97.6°F | Resp 16 | Ht 68.0 in | Wt 210.0 lb

## 2016-02-21 DIAGNOSIS — I509 Heart failure, unspecified: Secondary | ICD-10-CM | POA: Insufficient documentation

## 2016-02-21 DIAGNOSIS — Z959 Presence of cardiac and vascular implant and graft, unspecified: Secondary | ICD-10-CM

## 2016-02-21 DIAGNOSIS — I6521 Occlusion and stenosis of right carotid artery: Secondary | ICD-10-CM | POA: Insufficient documentation

## 2016-02-21 DIAGNOSIS — I110 Hypertensive heart disease with heart failure: Secondary | ICD-10-CM | POA: Insufficient documentation

## 2016-02-21 DIAGNOSIS — E785 Hyperlipidemia, unspecified: Secondary | ICD-10-CM | POA: Diagnosis not present

## 2016-02-21 DIAGNOSIS — E119 Type 2 diabetes mellitus without complications: Secondary | ICD-10-CM | POA: Insufficient documentation

## 2016-02-21 DIAGNOSIS — Z9889 Other specified postprocedural states: Secondary | ICD-10-CM | POA: Diagnosis not present

## 2016-02-21 DIAGNOSIS — Z48812 Encounter for surgical aftercare following surgery on the circulatory system: Secondary | ICD-10-CM

## 2016-02-21 DIAGNOSIS — I6523 Occlusion and stenosis of bilateral carotid arteries: Secondary | ICD-10-CM

## 2016-02-21 DIAGNOSIS — Z95828 Presence of other vascular implants and grafts: Secondary | ICD-10-CM | POA: Insufficient documentation

## 2016-02-21 NOTE — Patient Instructions (Signed)
Stroke Prevention Some medical conditions and behaviors are associated with an increased chance of having a stroke. You may prevent a stroke by making healthy choices and managing medical conditions. HOW CAN I REDUCE MY RISK OF HAVING A STROKE?   Stay physically active. Get at least 30 minutes of activity on most or all days.  Do not smoke. It may also be helpful to avoid exposure to secondhand smoke.  Limit alcohol use. Moderate alcohol use is considered to be:  No more than 2 drinks per day for men.  No more than 1 drink per day for nonpregnant women.  Eat healthy foods. This involves:  Eating 5 or more servings of fruits and vegetables a day.  Making dietary changes that address high blood pressure (hypertension), high cholesterol, diabetes, or obesity.  Manage your cholesterol levels.  Making food choices that are high in fiber and low in saturated fat, trans fat, and cholesterol may control cholesterol levels.  Take any prescribed medicines to control cholesterol as directed by your health care provider.  Manage your diabetes.  Controlling your carbohydrate and sugar intake is recommended to manage diabetes.  Take any prescribed medicines to control diabetes as directed by your health care provider.  Control your hypertension.  Making food choices that are low in salt (sodium), saturated fat, trans fat, and cholesterol is recommended to manage hypertension.  Ask your health care provider if you need treatment to lower your blood pressure. Take any prescribed medicines to control hypertension as directed by your health care provider.  If you are 18-39 years of age, have your blood pressure checked every 3-5 years. If you are 40 years of age or older, have your blood pressure checked every year.  Maintain a healthy weight.  Reducing calorie intake and making food choices that are low in sodium, saturated fat, trans fat, and cholesterol are recommended to manage  weight.  Stop drug abuse.  Avoid taking birth control pills.  Talk to your health care provider about the risks of taking birth control pills if you are over 35 years old, smoke, get migraines, or have ever had a blood clot.  Get evaluated for sleep disorders (sleep apnea).  Talk to your health care provider about getting a sleep evaluation if you snore a lot or have excessive sleepiness.  Take medicines only as directed by your health care provider.  For some people, aspirin or blood thinners (anticoagulants) are helpful in reducing the risk of forming abnormal blood clots that can lead to stroke. If you have the irregular heart rhythm of atrial fibrillation, you should be on a blood thinner unless there is a good reason you cannot take them.  Understand all your medicine instructions.  Make sure that other conditions (such as anemia or atherosclerosis) are addressed. SEEK IMMEDIATE MEDICAL CARE IF:   You have sudden weakness or numbness of the face, arm, or leg, especially on one side of the body.  Your face or eyelid droops to one side.  You have sudden confusion.  You have trouble speaking (aphasia) or understanding.  You have sudden trouble seeing in one or both eyes.  You have sudden trouble walking.  You have dizziness.  You have a loss of balance or coordination.  You have a sudden, severe headache with no known cause.  You have new chest pain or an irregular heartbeat. Any of these symptoms may represent a serious problem that is an emergency. Do not wait to see if the symptoms will   go away. Get medical help at once. Call your local emergency services (911 in [place] Do not drive yourself to the hospital.   This information is not intended to replace advice given to you by your health care provider. Make sure you discuss any questions you have with your health care provider.   Document Released: 01/24/2005 Document Revised: 01/07/2015 Document Reviewed:  06/19/2013 Elsevier Interactive Patient Education 2016 Elsevier Inc.  
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
## 2016-02-21 NOTE — Progress Notes (Signed)
Filed Vitals:   02/21/16 1447 02/21/16 1450 02/21/16 1455 02/21/16 1457  BP: 155/85 142/82 137/86 155/76  Pulse: 83 86 83 78  Temp:  97.6 F (36.4 C)    TempSrc:  Oral    Resp:  16    Height:  5\' 8"  (1.727 m)    Weight:  210 lb (95.255 kg)    SpO2:  98%

## 2016-02-23 ENCOUNTER — Ambulatory Visit (INDEPENDENT_AMBULATORY_CARE_PROVIDER_SITE_OTHER): Payer: Medicare Other | Admitting: Podiatry

## 2016-02-23 ENCOUNTER — Encounter: Payer: Self-pay | Admitting: Podiatry

## 2016-02-23 DIAGNOSIS — B351 Tinea unguium: Secondary | ICD-10-CM | POA: Diagnosis not present

## 2016-02-23 DIAGNOSIS — M79674 Pain in right toe(s): Secondary | ICD-10-CM | POA: Diagnosis not present

## 2016-02-23 DIAGNOSIS — M79675 Pain in left toe(s): Secondary | ICD-10-CM | POA: Diagnosis not present

## 2016-02-23 DIAGNOSIS — M79676 Pain in unspecified toe(s): Secondary | ICD-10-CM

## 2016-02-23 NOTE — Progress Notes (Signed)
Patient ID: [name], female   DOB: 1933/06/16, 82 y.o.   MRN: [id number] Complaint:  Visit Type: Patient returns to my office for continued preventative foot care services. Complaint: Patient states" my nails have grown long and thick and become painful to walk and wear shoes. The patient presents for preventative foot care services. No changes to ROS  Podiatric Exam: Vascular: dorsalis pedis and posterior tibial pulses are palpable bilateral. Capillary return is immediate. Temperature gradient is WNL. Skin turgor WNL  Sensorium: Normal Semmes Weinstein monofilament test. Normal tactile sensation bilaterally. Nail Exam: Pt has thick disfigured discolored nails with subungual debris noted bilateral entire nail hallux through fifth toenails Ulcer Exam: There is no evidence of ulcer or pre-ulcerative changes or infection. Orthopedic Exam: Muscle tone and strength are WNL. No limitations in general ROM. No crepitus or effusions noted. Foot type and digits show no abnormalities. Bony prominences are unremarkable. Skin: No Porokeratosis. No infection or ulcers  Diagnosis:  Onychomycosis, , Pain in right toe, pain in left toes  Treatment & Plan Procedures and Treatment: Consent by patient was obtained for treatment procedures. The patient understood the discussion of treatment and procedures well. All questions were answered thoroughly reviewed. Debridement of mycotic and hypertrophic toenails, 1 through 5 bilateral and clearing of subungual debris. No ulceration, no infection noted.  Return Visit-Office Procedure: Patient instructed to return to the office for a follow up visit 3 months for continued evaluation and treatment.  Gardiner Barefoot DPM

## 2016-02-27 ENCOUNTER — Other Ambulatory Visit: Payer: Self-pay | Admitting: Cardiology

## 2016-02-27 NOTE — Telephone Encounter (Signed)
REFILL 

## 2016-03-01 ENCOUNTER — Ambulatory Visit (INDEPENDENT_AMBULATORY_CARE_PROVIDER_SITE_OTHER): Payer: Medicare Other | Admitting: Pharmacist

## 2016-03-01 DIAGNOSIS — I4891 Unspecified atrial fibrillation: Secondary | ICD-10-CM | POA: Diagnosis not present

## 2016-03-01 DIAGNOSIS — Z7901 Long term (current) use of anticoagulants: Secondary | ICD-10-CM | POA: Diagnosis not present

## 2016-03-01 DIAGNOSIS — I482 Chronic atrial fibrillation, unspecified: Secondary | ICD-10-CM

## 2016-03-01 LAB — PROTIME-INR: INR: 2.1 — AB (ref 0.9–1.1)

## 2016-03-13 ENCOUNTER — Ambulatory Visit (INDEPENDENT_AMBULATORY_CARE_PROVIDER_SITE_OTHER): Payer: Medicare Other | Admitting: Family Medicine

## 2016-03-13 ENCOUNTER — Encounter: Payer: Self-pay | Admitting: Family Medicine

## 2016-03-13 VITALS — BP 132/86 | HR 78 | Wt 206.0 lb

## 2016-03-13 DIAGNOSIS — D649 Anemia, unspecified: Secondary | ICD-10-CM | POA: Diagnosis not present

## 2016-03-13 DIAGNOSIS — R42 Dizziness and giddiness: Secondary | ICD-10-CM | POA: Diagnosis not present

## 2016-03-13 DIAGNOSIS — H6123 Impacted cerumen, bilateral: Secondary | ICD-10-CM | POA: Diagnosis not present

## 2016-03-13 LAB — CBC
HCT: 40 % (ref 36.0–46.0)
Hemoglobin: 12.8 g/dL (ref 12.0–15.0)
MCHC: 32 g/dL (ref 30.0–36.0)
MCV: 74.6 fl — ABNORMAL LOW (ref 78.0–100.0)
Platelets: 271 10*3/uL (ref 150.0–400.0)
RBC: 5.36 Mil/uL — ABNORMAL HIGH (ref 3.87–5.11)
RDW: 20.1 % — AB (ref 11.5–15.5)
WBC: 8.6 10*3/uL (ref 4.0–10.5)

## 2016-03-13 LAB — FERRITIN: Ferritin: 15.6 ng/mL (ref 10.0–291.0)

## 2016-03-13 MED ORDER — MECLIZINE HCL 25 MG PO TABS: 25.0000 mg | ORAL_TABLET | Freq: Three times a day (TID) | ORAL | Status: DC | PRN

## 2016-03-13 NOTE — Progress Notes (Signed)
Pre visit review using our clinic review tool, if applicable. No additional management support is needed unless otherwise documented below in the visit note. 

## 2016-03-13 NOTE — Patient Instructions (Addendum)
Dizziness 1. Cleaned ears out may have contributed some 2. Could be inner ear/BPPV issues- trial meclizine 3. I doubt stroke with rest of your exam being normal but we could consider MRI if persistent symptoms not helped by meclizine as you do have several risk factors 4. Lets make sure anemia not worsening- check with lab  update me a week from now or if worsening symptoms.

## 2016-03-13 NOTE — Progress Notes (Signed)
Alexandria Reddish, MD  Subjective:  [name] is a 82 y.o. year old very pleasant female patient who presents for/with See problem oriented charting ROS- see HPI below. No chest pain or shortness of breath. No headache or blurry vision.   Past Medical History-  Patient Active Problem List   Diagnosis Date Noted  . Non-proliferative diabetic retinopathy, mild, left eye ([place]) 10/10/2015    Priority: High  . Atrial fibrillation ([place]) 01/08/2014    Priority: High  . Diastolic CHF, chronic ([place]) 01/02/2014    Priority: High  . Type II diabetes mellitus with ophthalmic manifestations ([place]) 01/02/2014    Priority: High  . Coronary artery disease     Priority: High  . Carotid artery stenosis 04/01/2012    Priority: High  . Depression 03/25/2015    Priority: Medium  . HTN (hypertension) 08/29/2011    Priority: Medium  . Hyperlipidemia 07/18/2011    Priority: Medium  . Allergic rhinitis 11/19/2015    Priority: Low  . GERD (gastroesophageal reflux disease) 03/25/2015    Priority: Low  . Long term current use of anticoagulant therapy 01/08/2014    Priority: Low  . Lichen sclerosus     Priority: Low  . Obesities, morbid ([place]) 07/18/2011    Priority: Low  . Carotid artery stenosis, asymptomatic 07/06/2015  . Carotid stenosis 07/06/2015  . Edema leg 05/26/2015  . Spinal stenosis     Medications- reviewed and updated Current Outpatient Prescriptions  Medication Sig Dispense Refill  . acetaminophen (TYLENOL) 500 MG tablet Take 1,000 mg by mouth. Reported on 02/21/2016    . albuterol (PROVENTIL HFA;VENTOLIN HFA) 108 (90 BASE) MCG/ACT inhaler Inhale 2 puffs into the lungs every 6 (six) hours as needed for wheezing or shortness of breath. 1 Inhaler 0  . ALPRAZolam (XANAX) 1 MG tablet Take by mouth.    . clobetasol cream (TEMOVATE) 0.05 % Apply nightly as needed for irritation 60 g 1  . ferrous sulfate 325 (65 FE) MG tablet Take 1 tablet (325 mg total) by mouth 2 (two) times daily with  a meal. 60 tablet 5  . FLUoxetine (PROZAC) 20 MG tablet Take 20 mg by mouth 3 (three) times a week.     . Loratadine 10 MG CAPS Take 1 capsule (10 mg total) by mouth daily. 30 each 5  . losartan (COZAAR) 50 MG tablet TAKE 1 TABLET (50 MG TOTAL) BY MOUTH 2 (TWO) TIMES DAILY. 60 tablet 6  . metFORMIN (GLUCOPHAGE) 500 MG tablet TAKE 1 TABLET (500 MG TOTAL) BY MOUTH DAILY WITH BREAKFAST. 30 tablet 5  . metoprolol succinate (TOPROL-XL) 25 MG 24 hr tablet TAKE 1 TABLET(S) BY MOUTH DAILY 30 tablet 8  . nitroGLYCERIN (NITROSTAT) 0.4 MG SL tablet Place 1 tablet (0.4 mg total) under the tongue every 5 (five) minutes as needed. For chest pain 25 tablet 2  . warfarin (COUMADIN) 5 MG tablet Take 1-1.5 tablets by mouth daily as directed by coumadin clinic 40 tablet 3  . meclizine (ANTIVERT) 25 MG tablet Take 1 tablet (25 mg total) by mouth 3 (three) times daily as needed for dizziness. 30 tablet 0  . omeprazole (PRILOSEC) 20 MG capsule Take 1 capsule (20 mg total) by mouth daily. (Patient taking differently: Take 20 mg by mouth. Take One tablet every other day) 90 capsule 3   No current facility-administered medications for this visit.    Objective: BP 132/86 mmHg  Pulse 78  Wt 206 lb (93.441 kg)  SpO2 96% Gen: NAD,  resting comfortably Cerumen impaction- after irrigation- TM normal bilaterally CV: RRR no murmurs rubs or gallops Lungs: CTAB no crackles, wheeze, rhonchi Abdomen: soft/nontender/nondistended/normal bowel sounds. No rebound or guarding.  Skin: warm, dry Neuro: CN II-XII intact, sensation and reflexes normal throughout, 5/5 muscle strength in bilateral upper and lower extremities. Normal finger to nose. Normal rapid alternating movements. No pronator drift. Normal romberg. Normal gait.    Assessment/Plan:  Dizzy S: Dizzy at night in the mornings since Sunday. Has had a history of some inner ear issues causing issues in the past. Wilburn Mylar was somewhat dizzy all day- used walker all day.  Has not been sick lately. No tinnitus or hearing loss. Denies issues with head movement but on exam admits to dizziness when moving her head. Slight ear fullness on left. Does not describe classic room spinning ROS-No facial or extremity weakness. No slurred words or trouble swallowing. no blurry vision or double vision. No paresthesias. No confusion or word finding difficulties.  A/P: new symptom. Cerumen impaction bilaterally s/p irrigation by [name], CMA but not likely etiology though it did improve per patient after irrigation- she walked out of office without any dizziness Reassuring neurological exam though is at high risk for CVA/TIA- she is on coumadin which is reassuring though obviously wouldn't definitely prevent ischemia. Has carotid stenosis history but studies last month showed patency. Given her improvement in symptoms over last day- will monitor for now. If worsening or persistent would consider MRI brain Anemia could contribute as could not tolerate iron- will repeat CBC today BPPV also possible although seems to describe more prolonged episodes. We will trial meclizine.  Asked patient to update me a week from now or if she has worsening symptoms.   Return precautions advised.   Orders Placed This Encounter  Procedures  . CBC    [place]  . Ferritin    Meds ordered this encounter  Medications  . meclizine (ANTIVERT) 25 MG tablet    Sig: Take 1 tablet (25 mg total) by mouth 3 (three) times daily as needed for dizziness.    Dispense:  30 tablet    Refill:  0

## 2016-03-27 ENCOUNTER — Other Ambulatory Visit: Payer: Self-pay | Admitting: Cardiology

## 2016-03-29 ENCOUNTER — Ambulatory Visit (INDEPENDENT_AMBULATORY_CARE_PROVIDER_SITE_OTHER): Payer: Medicare Other | Admitting: Cardiovascular Disease

## 2016-03-29 DIAGNOSIS — I482 Chronic atrial fibrillation, unspecified: Secondary | ICD-10-CM

## 2016-03-29 DIAGNOSIS — I4891 Unspecified atrial fibrillation: Secondary | ICD-10-CM | POA: Diagnosis not present

## 2016-03-29 DIAGNOSIS — Z7901 Long term (current) use of anticoagulants: Secondary | ICD-10-CM | POA: Diagnosis not present

## 2016-03-29 LAB — PROTIME-INR: INR: 2.4 — AB (ref 0.9–1.1)

## 2016-04-11 DIAGNOSIS — M722 Plantar fascial fibromatosis: Secondary | ICD-10-CM | POA: Diagnosis not present

## 2016-04-11 DIAGNOSIS — M25571 Pain in right ankle and joints of right foot: Secondary | ICD-10-CM | POA: Diagnosis not present

## 2016-04-19 ENCOUNTER — Ambulatory Visit (INDEPENDENT_AMBULATORY_CARE_PROVIDER_SITE_OTHER): Payer: Medicare Other | Admitting: Podiatry

## 2016-04-19 ENCOUNTER — Encounter: Payer: Self-pay | Admitting: Podiatry

## 2016-04-19 DIAGNOSIS — L84 Corns and callosities: Secondary | ICD-10-CM

## 2016-04-19 DIAGNOSIS — I6521 Occlusion and stenosis of right carotid artery: Secondary | ICD-10-CM

## 2016-04-19 MED ORDER — CEPHALEXIN 500 MG PO CAPS: 500.0000 mg | ORAL_CAPSULE | Freq: Two times a day (BID) | ORAL | Status: DC

## 2016-04-19 NOTE — Progress Notes (Addendum)
Subjective:     Patient ID: [name], female   DOB: [birth year], 82 y.o.   MRN: [id number]  HPI this patient presents to the office with chief complaint of a painful fourth toe, right foot. Patient states it was a corn that has developed into a severely painful toe in the last few days she has provided no self treatment nor sought any professional help. She presents the office for an evaluation and treatment of this painful fourth toe corn   Review of Systems     Objective:   Physical Exam GENERAL APPEARANCE: Alert, conversant. Appropriately groomed. No acute distress.  VASCULAR: Pedal pulses are  palpable at  [facility] and PT bilateral.  Capillary refill time is immediate to all digits,  Normal temperature gradient.  Digital hair growth is present bilateral  NEUROLOGIC: sensation is normal to 5.07 monofilament at 5/5 sites bilateral.  Light touch is intact bilateral, Muscle strength normal.  MUSCULOSKELETAL: acceptable muscle strength, tone and stability bilateral.  Intrinsic muscluature intact bilateral.  Rectus appearance of foot and digits noted bilateral. Hammer toes 2-4 right foot with heloma durum.   DERMATOLOGIC: skin color, texture, and turgor are within normal limits.  No preulcerative lesions or ulcers  are seen, no interdigital maceration noted.  No open lesions present.  Digital nails are asymptomatic. No drainage noted.Heloma durum 4th toe right foot.Distal clavi third toe left foot.      Assessment:     Infected corn fourth toe right foot.    Plan:     ROV   Injected fourth toe.  Incision and ndrainage abscess fourth toe right foot under local anesthesia.  Prescribed cephalexin 500 mg  # 20.  Home soaks given.  RTC prn   Gardiner Barefoot DPM

## 2016-04-23 ENCOUNTER — Other Ambulatory Visit: Payer: Self-pay | Admitting: *Deleted

## 2016-04-23 MED ORDER — METFORMIN HCL 500 MG PO TABS: ORAL_TABLET | ORAL | Status: DC

## 2016-05-03 ENCOUNTER — Ambulatory Visit (INDEPENDENT_AMBULATORY_CARE_PROVIDER_SITE_OTHER): Payer: Private Health Insurance - Indemnity | Admitting: Cardiology

## 2016-05-03 DIAGNOSIS — I482 Chronic atrial fibrillation, unspecified: Secondary | ICD-10-CM

## 2016-05-03 DIAGNOSIS — Z7901 Long term (current) use of anticoagulants: Secondary | ICD-10-CM | POA: Diagnosis not present

## 2016-05-03 DIAGNOSIS — I4891 Unspecified atrial fibrillation: Secondary | ICD-10-CM | POA: Diagnosis not present

## 2016-05-03 LAB — PROTIME-INR: INR: 2.9 — AB (ref 0.9–1.1)

## 2016-05-15 ENCOUNTER — Encounter: Payer: Self-pay | Admitting: Family Medicine

## 2016-05-15 ENCOUNTER — Ambulatory Visit (INDEPENDENT_AMBULATORY_CARE_PROVIDER_SITE_OTHER): Payer: Medicare Other | Admitting: Family Medicine

## 2016-05-15 VITALS — BP 130/90 | HR 86 | Temp 97.9°F | Ht 68.0 in | Wt 217.0 lb

## 2016-05-15 DIAGNOSIS — I6521 Occlusion and stenosis of right carotid artery: Secondary | ICD-10-CM

## 2016-05-15 DIAGNOSIS — D509 Iron deficiency anemia, unspecified: Secondary | ICD-10-CM

## 2016-05-15 DIAGNOSIS — I5032 Chronic diastolic (congestive) heart failure: Secondary | ICD-10-CM | POA: Diagnosis not present

## 2016-05-15 DIAGNOSIS — Z23 Encounter for immunization: Secondary | ICD-10-CM | POA: Diagnosis not present

## 2016-05-15 DIAGNOSIS — R5383 Other fatigue: Secondary | ICD-10-CM | POA: Diagnosis not present

## 2016-05-15 LAB — CBC
HCT: 39.3 % (ref 36.0–46.0)
Hemoglobin: 12.3 g/dL (ref 12.0–15.0)
MCHC: 31.4 g/dL (ref 30.0–36.0)
MCV: 77.7 fl — ABNORMAL LOW (ref 78.0–100.0)
Platelets: 282 10*3/uL (ref 150.0–400.0)
RBC: 5.06 Mil/uL (ref 3.87–5.11)
RDW: 18.9 % — ABNORMAL HIGH (ref 11.5–15.5)
WBC: 8.6 10*3/uL (ref 4.0–10.5)

## 2016-05-15 LAB — COMPREHENSIVE METABOLIC PANEL
ALT: 11 U/L (ref 0–35)
AST: 16 U/L (ref 0–37)
Albumin: 4.2 g/dL (ref 3.5–5.2)
Alkaline Phosphatase: 97 U/L (ref 39–117)
BUN: 13 mg/dL (ref 6–23)
CO2: 25 mEq/L (ref 19–32)
Calcium: 9.4 mg/dL (ref 8.4–10.5)
Chloride: 98 mEq/L (ref 96–112)
Creatinine, Ser: 0.71 mg/dL (ref 0.40–1.20)
GFR: 84.01 mL/min (ref >60.00)
Glucose, Bld: 113 mg/dL — ABNORMAL HIGH (ref 70–99)
Potassium: 4.9 mEq/L (ref 3.5–5.1)
Sodium: 136 mEq/L (ref 135–145)
Total Bilirubin: 1.4 mg/dL — ABNORMAL HIGH (ref 0.2–1.2)
Total Protein: 6.7 g/dL (ref 6.0–8.3)

## 2016-05-15 LAB — FERRITIN: Ferritin: 16.9 ng/mL (ref 10.0–291.0)

## 2016-05-15 LAB — TSH: TSH: 4.1 u[IU]/mL (ref 0.35–4.50)

## 2016-05-15 NOTE — Progress Notes (Signed)
Subjective:  [name] is a 82 y.o. year old very pleasant female patient who presents for/with See problem oriented charting ROS- No chest pain. No headache or blurry vision.see any ROS included in HPI as well.   Past Medical History-  Patient Active Problem List   Diagnosis Date Noted  . Anemia, iron deficiency 05/15/2016    Priority: High  . Non-proliferative diabetic retinopathy, mild, left eye ([place]) 10/10/2015    Priority: High  . Atrial fibrillation ([place]) 01/08/2014    Priority: High  . Diastolic CHF, chronic ([place]) 01/02/2014    Priority: High  . Type II diabetes mellitus with ophthalmic manifestations ([place]) 01/02/2014    Priority: High  . Coronary artery disease     Priority: High  . Carotid artery stenosis 04/01/2012    Priority: High  . Depression 03/25/2015    Priority: Medium  . HTN (hypertension) 08/29/2011    Priority: Medium  . Hyperlipidemia 07/18/2011    Priority: Medium  . Allergic rhinitis 11/19/2015    Priority: Low  . Edema leg 05/26/2015    Priority: Low  . GERD (gastroesophageal reflux disease) 03/25/2015    Priority: Low  . Long term current use of anticoagulant therapy 01/08/2014    Priority: Low  . Lichen sclerosus     Priority: Low  . Obesities, morbid ([place]) 07/18/2011    Priority: Low  . Spinal stenosis     Medications- reviewed and updated Current Outpatient Prescriptions  Medication Sig Dispense Refill  . acetaminophen (TYLENOL) 500 MG tablet Take 1,000 mg by mouth. Reported on 02/21/2016    . albuterol (PROVENTIL HFA;VENTOLIN HFA) 108 (90 BASE) MCG/ACT inhaler Inhale 2 puffs into the lungs every 6 (six) hours as needed for wheezing or shortness of breath. 1 Inhaler 0  . ALPRAZolam (XANAX) 1 MG tablet Take by mouth.    . clobetasol cream (TEMOVATE) 0.05 % Apply nightly as needed for irritation 60 g 1  . ferrous sulfate 325 (65 FE) MG tablet Take 1 tablet (325 mg total) by mouth 2 (two) times daily with a meal. 60 tablet 5  .  FLUoxetine (PROZAC) 20 MG tablet Take 20 mg by mouth 3 (three) times a week.     . Loratadine 10 MG CAPS Take 1 capsule (10 mg total) by mouth daily. 30 each 5  . losartan (COZAAR) 50 MG tablet TAKE 1 TABLET (50 MG TOTAL) BY MOUTH 2 (TWO) TIMES DAILY. 60 tablet 6  . metFORMIN (GLUCOPHAGE) 500 MG tablet TAKE 1 TABLET (500 MG TOTAL) BY MOUTH DAILY WITH BREAKFAST. 30 tablet 5  . metoprolol succinate (TOPROL-XL) 25 MG 24 hr tablet TAKE 1 TABLET(S) BY MOUTH DAILY 30 tablet 8  . warfarin (COUMADIN) 5 MG tablet Take 1-1.5 tablets by mouth daily as directed by coumadin clinic 40 tablet 3  . nitroGLYCERIN (NITROSTAT) 0.4 MG SL tablet Place 1 tablet (0.4 mg total) under the tongue every 5 (five) minutes as needed. For chest pain 25 tablet 2  . omeprazole (PRILOSEC) 20 MG capsule Take 1 capsule (20 mg total) by mouth daily. (Patient taking differently: Take 20 mg by mouth. Take One tablet every other day) 90 capsule 3   No current facility-administered medications for this visit.    Objective: BP 130/90 mmHg  Pulse 86  Temp(Src) 97.9 F (36.6 C) (Oral)  Ht 5\' 8"  (1.727 m)  Wt 217 lb (98.431 kg)  BMI 33.00 kg/m2  SpO2 94% Gen: NAD, resting comfortably CV: irregularly irregular no murmurs rubs or  gallops Lungs: CTAB no crackles, wheeze, rhonchi Abdomen: soft/nontender/nondistended/normal bowel sounds. obese Ext: 1+ edema Skin: warm, dry, no rash Neuro: grossly normal, moves all extremities  Assessment/Plan:  Anemia, iron deficiency S: Likely due to AVM on colonoscopy 01/2016. Does not tolerate iron twice a day. Colonoscopy showed 3 adenomas also removed. Patient also on coumadin increasing risk, also on aspirin for CAD history. She has had some worsening fatigue sinc elast visit and wonders if this is iron deficiency related. She was also placed on pepcid to reduce upper GI risk by GI and remains on this A/P: iron to try once a day  And if does not tolerate, consider iron infusion. Will update  labs today and if ferritin decreased- go ahead and push for infusion Consider discussing with cards stopping one of agents (aspirin, coumadin) if recurrent or more severe issues- fortunately last time was no longer anemic.   Concerning other causes of fatigue- doubt thyroid related. No snoring to suggest sleep apnea. Obviously doubt colon cancer. No unintentional weight loss- doubt malignancy in general- patient is not screening for breast cancer at present  Diastolic CHF, chronic (HCC) S: weight up 11 lbs in 2 months, she reports mild shortness of breath and restarted taking lasix after several weeks off yesterday A/P: CHF is worsening- I advised her to return to daily lasix for a minimum of a week but also to monitor her weight to have it return to prior baseline. If she is not improving- she is to return to care. If anemic could contribute to edema as well.     Return in about 2 months (around 07/15/2016). Return precautions advised.   Orders Placed This Encounter  Procedures  . Pneumococcal polysaccharide vaccine 23-valent greater than or equal to 2yo subcutaneous/IM  . CBC    [place]  . Ferritin  . Iron and TIBC  . TSH    [place]  . Comprehensive metabolic panel    [place]   [name], MD

## 2016-05-15 NOTE — Assessment & Plan Note (Signed)
S: weight up 11 lbs in 2 months, she reports mild shortness of breath and restarted taking lasix after several weeks off yesterday A/P: CHF is worsening- I advised her to return to daily lasix for a minimum of a week but also to monitor her weight to have it return to prior baseline. If she is not improving- she is to return to care. If anemic could contribute to edema as well.

## 2016-05-15 NOTE — Assessment & Plan Note (Signed)
S: Likely due to AVM on colonoscopy 01/2016. Does not tolerate iron twice a day. Colonoscopy showed 3 adenomas also removed. Patient also on coumadin increasing risk, also on aspirin for CAD history. She has had some worsening fatigue sinc elast visit and wonders if this is iron deficiency related. She was also placed on pepcid to reduce upper GI risk by GI and remains on this A/P: iron to try once a day  And if does not tolerate, consider iron infusion. Will update labs today and if ferritin decreased- go ahead and push for infusion Consider discussing with cards stopping one of agents (aspirin, coumadin) if recurrent or more severe issues- fortunately last time was no longer anemic.   Concerning other causes of fatigue- doubt thyroid related. No snoring to suggest sleep apnea. Obviously doubt colon cancer. No unintentional weight loss- doubt malignancy in general- patient is not screening for breast cancer at present

## 2016-05-15 NOTE — Patient Instructions (Addendum)
Labs today. If ferritin under 10- consider iron transfusion  Otherwise, continue iron rich diet and iron pills once a day  2 month follow up to check in and see how you are doing

## 2016-05-16 LAB — IRON AND TIBC
%SAT: 9 % — ABNORMAL LOW (ref 11–50)
Iron: 41 ug/dL — ABNORMAL LOW (ref 45–160)
TIBC: 447 ug/dL (ref 250–450)
UIBC: 406 ug/dL — AB (ref 125–400)

## 2016-05-17 ENCOUNTER — Ambulatory Visit: Payer: Medicare Other | Admitting: Podiatry

## 2016-05-22 ENCOUNTER — Other Ambulatory Visit: Payer: Self-pay

## 2016-05-22 DIAGNOSIS — I2510 Atherosclerotic heart disease of native coronary artery without angina pectoris: Secondary | ICD-10-CM

## 2016-05-22 MED ORDER — NITROGLYCERIN 0.4 MG SL SUBL: 0.4000 mg | SUBLINGUAL_TABLET | SUBLINGUAL | Status: DC | PRN

## 2016-05-22 NOTE — Telephone Encounter (Signed)
Rx(s) sent to pharmacy electronically.  

## 2016-06-01 ENCOUNTER — Ambulatory Visit (INDEPENDENT_AMBULATORY_CARE_PROVIDER_SITE_OTHER): Payer: Medicare Other | Admitting: Cardiology

## 2016-06-01 ENCOUNTER — Encounter: Payer: Self-pay | Admitting: Cardiology

## 2016-06-01 VITALS — BP 134/72 | HR 92 | Ht 68.0 in | Wt 206.0 lb

## 2016-06-01 DIAGNOSIS — I482 Chronic atrial fibrillation, unspecified: Secondary | ICD-10-CM

## 2016-06-01 DIAGNOSIS — I6521 Occlusion and stenosis of right carotid artery: Secondary | ICD-10-CM

## 2016-06-01 DIAGNOSIS — I2510 Atherosclerotic heart disease of native coronary artery without angina pectoris: Secondary | ICD-10-CM | POA: Diagnosis not present

## 2016-06-01 DIAGNOSIS — I5032 Chronic diastolic (congestive) heart failure: Secondary | ICD-10-CM

## 2016-06-01 DIAGNOSIS — I6529 Occlusion and stenosis of unspecified carotid artery: Secondary | ICD-10-CM | POA: Diagnosis not present
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
## 2016-06-01 NOTE — Patient Instructions (Addendum)
Continue your current therapy  I will see you in 6-8 months. 

## 2016-06-03 ENCOUNTER — Other Ambulatory Visit: Payer: Self-pay | Admitting: Cardiology

## 2016-06-05 ENCOUNTER — Other Ambulatory Visit: Payer: Self-pay

## 2016-06-07 ENCOUNTER — Ambulatory Visit (INDEPENDENT_AMBULATORY_CARE_PROVIDER_SITE_OTHER): Payer: Medicare Other | Admitting: Interventional Cardiology

## 2016-06-07 DIAGNOSIS — I482 Chronic atrial fibrillation, unspecified: Secondary | ICD-10-CM

## 2016-06-07 DIAGNOSIS — Z7901 Long term (current) use of anticoagulants: Secondary | ICD-10-CM | POA: Diagnosis not present

## 2016-06-07 DIAGNOSIS — I4891 Unspecified atrial fibrillation: Secondary | ICD-10-CM | POA: Diagnosis not present

## 2016-06-07 LAB — PROTIME-INR: INR: 2.4 — AB (ref 0.9–1.1)

## 2016-07-06 DIAGNOSIS — R2681 Unsteadiness on feet: Secondary | ICD-10-CM | POA: Diagnosis not present

## 2016-07-06 DIAGNOSIS — R42 Dizziness and giddiness: Secondary | ICD-10-CM | POA: Diagnosis not present

## 2016-07-10 DIAGNOSIS — R2681 Unsteadiness on feet: Secondary | ICD-10-CM | POA: Diagnosis not present

## 2016-07-10 DIAGNOSIS — R42 Dizziness and giddiness: Secondary | ICD-10-CM | POA: Diagnosis not present

## 2016-07-18 ENCOUNTER — Ambulatory Visit (INDEPENDENT_AMBULATORY_CARE_PROVIDER_SITE_OTHER): Payer: Private Health Insurance - Indemnity | Admitting: Podiatry

## 2016-07-18 ENCOUNTER — Encounter: Payer: Self-pay | Admitting: Podiatry

## 2016-07-18 DIAGNOSIS — M79676 Pain in unspecified toe(s): Secondary | ICD-10-CM | POA: Diagnosis not present

## 2016-07-18 DIAGNOSIS — L84 Corns and callosities: Secondary | ICD-10-CM

## 2016-07-18 DIAGNOSIS — B351 Tinea unguium: Secondary | ICD-10-CM

## 2016-07-18 DIAGNOSIS — M79675 Pain in left toe(s): Secondary | ICD-10-CM

## 2016-07-18 NOTE — Progress Notes (Signed)
Patient ID: [name], female   DOB: 03/06/1933, 82 y.o.   MRN: [id number] Complaint:  Visit Type: Patient returns to my office for continued preventative foot care services. Complaint: Patient states" my nails have grown long and thick and become painful to walk and wear shoes. The patient presents for preventative foot care services. No changes to ROS  Podiatric Exam: Vascular: dorsalis pedis and posterior tibial pulses are palpable bilateral. Capillary return is immediate. Temperature gradient is WNL. Skin turgor WNL  Sensorium: Normal Semmes Weinstein monofilament test. Normal tactile sensation bilaterally. Nail Exam: Pt has thick disfigured discolored nails with subungual debris noted bilateral entire nail hallux through fifth toenails Ulcer Exam: There is no evidence of ulcer or pre-ulcerative changes or infection. Orthopedic Exam: Muscle tone and strength are WNL. No limitations in general ROM. No crepitus or effusions noted. Foot type and digits show no abnormalities. Bony prominences are unremarkable. Skin: No Porokeratosis. No infection or ulcers.  Corn 4th right and distal clavi 3rd left.  Diagnosis:  Onychomycosis, , Pain in right toe, pain in left toes, Debride callus  Treatment & Plan Procedures and Treatment: Consent by patient was obtained for treatment procedures. The patient understood the discussion of treatment and procedures well. All questions were answered thoroughly reviewed. Debridement of mycotic and hypertrophic toenails, 1 through 5 bilateral and clearing of subungual debris. No ulceration, no infection noted.  Return Visit-Office Procedure: Patient instructed to return to the office for a follow up visit 3 months for continued evaluation and treatment.  Gardiner Barefoot DPM

## 2016-07-19 ENCOUNTER — Ambulatory Visit (INDEPENDENT_AMBULATORY_CARE_PROVIDER_SITE_OTHER): Payer: Medicare Other | Admitting: Interventional Cardiology

## 2016-07-19 DIAGNOSIS — Z7901 Long term (current) use of anticoagulants: Secondary | ICD-10-CM | POA: Diagnosis not present

## 2016-07-19 DIAGNOSIS — I482 Chronic atrial fibrillation, unspecified: Secondary | ICD-10-CM

## 2016-07-19 DIAGNOSIS — I4891 Unspecified atrial fibrillation: Secondary | ICD-10-CM | POA: Diagnosis not present

## 2016-07-19 LAB — PROTIME-INR: INR: 1.7 — AB (ref 0.9–1.1)

## 2016-07-25 DIAGNOSIS — F4001 Agoraphobia with panic disorder: Secondary | ICD-10-CM | POA: Diagnosis not present

## 2016-08-01 DIAGNOSIS — R2681 Unsteadiness on feet: Secondary | ICD-10-CM | POA: Diagnosis not present

## 2016-08-01 DIAGNOSIS — R42 Dizziness and giddiness: Secondary | ICD-10-CM | POA: Diagnosis not present

## 2016-08-09 ENCOUNTER — Ambulatory Visit (INDEPENDENT_AMBULATORY_CARE_PROVIDER_SITE_OTHER): Payer: Medicare Other | Admitting: Cardiology

## 2016-08-09 DIAGNOSIS — I482 Chronic atrial fibrillation, unspecified: Secondary | ICD-10-CM

## 2016-08-09 DIAGNOSIS — Z7901 Long term (current) use of anticoagulants: Secondary | ICD-10-CM | POA: Diagnosis not present

## 2016-08-09 DIAGNOSIS — I4891 Unspecified atrial fibrillation: Secondary | ICD-10-CM | POA: Diagnosis not present

## 2016-08-09 LAB — PROTIME-INR: INR: 1.9 — AB (ref 0.9–1.1)

## 2016-08-23 ENCOUNTER — Ambulatory Visit (INDEPENDENT_AMBULATORY_CARE_PROVIDER_SITE_OTHER): Payer: Medicare Other | Admitting: Internal Medicine

## 2016-08-23 DIAGNOSIS — Z7901 Long term (current) use of anticoagulants: Secondary | ICD-10-CM | POA: Diagnosis not present

## 2016-08-23 DIAGNOSIS — I482 Chronic atrial fibrillation, unspecified: Secondary | ICD-10-CM

## 2016-08-23 DIAGNOSIS — I4891 Unspecified atrial fibrillation: Secondary | ICD-10-CM | POA: Diagnosis not present

## 2016-08-23 LAB — PROTIME-INR: INR: 1.7 — AB (ref 0.9–1.1)

## 2016-08-24 ENCOUNTER — Encounter: Payer: Self-pay | Admitting: Family

## 2016-08-28 ENCOUNTER — Encounter: Payer: Self-pay | Admitting: Family

## 2016-08-28 ENCOUNTER — Ambulatory Visit (INDEPENDENT_AMBULATORY_CARE_PROVIDER_SITE_OTHER): Payer: Medicare Other | Admitting: Family

## 2016-08-28 ENCOUNTER — Other Ambulatory Visit: Payer: Self-pay | Admitting: *Deleted

## 2016-08-28 ENCOUNTER — Ambulatory Visit (HOSPITAL_COMMUNITY)
Admission: RE | Admit: 2016-08-28 | Discharge: 2016-08-28 | Disposition: A | Discharge status: Home / Self Care | Payer: Medicare Other | Source: Ambulatory Visit | Attending: Family | Admitting: Family

## 2016-08-28 VITALS — BP 155/87 | HR 74 | Temp 97.9°F | Resp 16 | Ht 68.0 in | Wt 209.0 lb

## 2016-08-28 DIAGNOSIS — I6523 Occlusion and stenosis of bilateral carotid arteries: Secondary | ICD-10-CM

## 2016-08-28 DIAGNOSIS — Z959 Presence of cardiac and vascular implant and graft, unspecified: Secondary | ICD-10-CM

## 2016-08-28 DIAGNOSIS — I6521 Occlusion and stenosis of right carotid artery: Secondary | ICD-10-CM

## 2016-08-28 DIAGNOSIS — Z9889 Other specified postprocedural states: Secondary | ICD-10-CM | POA: Diagnosis not present

## 2016-08-28 DIAGNOSIS — Z48812 Encounter for surgical aftercare following surgery on the circulatory system: Secondary | ICD-10-CM | POA: Diagnosis not present
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
## 2016-08-28 NOTE — Patient Instructions (Signed)
Stroke Prevention Some medical conditions and behaviors are associated with an increased chance of having a stroke. You may prevent a stroke by making healthy choices and managing medical conditions. HOW CAN I REDUCE MY RISK OF HAVING A STROKE?   Stay physically active. Get at least 30 minutes of activity on most or all days.  Do not smoke. It may also be helpful to avoid exposure to secondhand smoke.  Limit alcohol use. Moderate alcohol use is considered to be:  No more than 2 drinks per day for men.  No more than 1 drink per day for nonpregnant women.  Eat healthy foods. This involves:  Eating 5 or more servings of fruits and vegetables a day.  Making dietary changes that address high blood pressure (hypertension), high cholesterol, diabetes, or obesity.  Manage your cholesterol levels.  Making food choices that are high in fiber and low in saturated fat, trans fat, and cholesterol may control cholesterol levels.  Take any prescribed medicines to control cholesterol as directed by your health care provider.  Manage your diabetes.  Controlling your carbohydrate and sugar intake is recommended to manage diabetes.  Take any prescribed medicines to control diabetes as directed by your health care provider.  Control your hypertension.  Making food choices that are low in salt (sodium), saturated fat, trans fat, and cholesterol is recommended to manage hypertension.  Ask your health care provider if you need treatment to lower your blood pressure. Take any prescribed medicines to control hypertension as directed by your health care provider.  If you are 18-39 years of age, have your blood pressure checked every 3-5 years. If you are 40 years of age or older, have your blood pressure checked every year.  Maintain a healthy weight.  Reducing calorie intake and making food choices that are low in sodium, saturated fat, trans fat, and cholesterol are recommended to manage  weight.  Stop drug abuse.  Avoid taking birth control pills.  Talk to your health care provider about the risks of taking birth control pills if you are over 35 years old, smoke, get migraines, or have ever had a blood clot.  Get evaluated for sleep disorders (sleep apnea).  Talk to your health care provider about getting a sleep evaluation if you snore a lot or have excessive sleepiness.  Take medicines only as directed by your health care provider.  For some people, aspirin or blood thinners (anticoagulants) are helpful in reducing the risk of forming abnormal blood clots that can lead to stroke. If you have the irregular heart rhythm of atrial fibrillation, you should be on a blood thinner unless there is a good reason you cannot take them.  Understand all your medicine instructions.  Make sure that other conditions (such as anemia or atherosclerosis) are addressed. SEEK IMMEDIATE MEDICAL CARE IF:   You have sudden weakness or numbness of the face, arm, or leg, especially on one side of the body.  Your face or eyelid droops to one side.  You have sudden confusion.  You have trouble speaking (aphasia) or understanding.  You have sudden trouble seeing in one or both eyes.  You have sudden trouble walking.  You have dizziness.  You have a loss of balance or coordination.  You have a sudden, severe headache with no known cause.  You have new chest pain or an irregular heartbeat. Any of these symptoms may represent a serious problem that is an emergency. Do not wait to see if the symptoms will   go away. Get medical help at once. Call your local emergency services (911 in [place] Do not drive yourself to the hospital.   This information is not intended to replace advice given to you by your health care provider. Make sure you discuss any questions you have with your health care provider.   Document Released: 01/24/2005 Document Revised: 01/07/2015 Document Reviewed:  06/19/2013 Elsevier Interactive Patient Education 2016 Elsevier Inc.  

## 2016-09-06 ENCOUNTER — Ambulatory Visit (INDEPENDENT_AMBULATORY_CARE_PROVIDER_SITE_OTHER): Payer: Medicare Other | Admitting: Cardiovascular Disease

## 2016-09-06 DIAGNOSIS — Z7901 Long term (current) use of anticoagulants: Secondary | ICD-10-CM | POA: Diagnosis not present

## 2016-09-06 DIAGNOSIS — I482 Chronic atrial fibrillation, unspecified: Secondary | ICD-10-CM

## 2016-09-06 DIAGNOSIS — I4891 Unspecified atrial fibrillation: Secondary | ICD-10-CM | POA: Diagnosis not present

## 2016-09-06 LAB — PROTIME-INR: INR: 2.8 — AB (ref 0.9–1.1)

## 2016-09-16 ENCOUNTER — Other Ambulatory Visit: Payer: Self-pay | Admitting: Cardiology

## 2016-09-18 ENCOUNTER — Encounter: Payer: Self-pay | Admitting: Family Medicine

## 2016-09-18 ENCOUNTER — Ambulatory Visit (INDEPENDENT_AMBULATORY_CARE_PROVIDER_SITE_OTHER): Payer: Medicare Other | Admitting: Family Medicine

## 2016-09-18 VITALS — BP 122/84 | HR 77 | Temp 98.8°F | Wt 211.6 lb

## 2016-09-18 DIAGNOSIS — E1139 Type 2 diabetes mellitus with other diabetic ophthalmic complication: Secondary | ICD-10-CM | POA: Diagnosis not present

## 2016-09-18 DIAGNOSIS — I5032 Chronic diastolic (congestive) heart failure: Secondary | ICD-10-CM | POA: Diagnosis not present

## 2016-09-18 DIAGNOSIS — I6523 Occlusion and stenosis of bilateral carotid arteries: Secondary | ICD-10-CM

## 2016-09-18 DIAGNOSIS — D509 Iron deficiency anemia, unspecified: Secondary | ICD-10-CM

## 2016-09-18 DIAGNOSIS — Z23 Encounter for immunization: Secondary | ICD-10-CM

## 2016-09-18 LAB — COMPREHENSIVE METABOLIC PANEL
ALT: 11 U/L (ref 0–35)
AST: 18 U/L (ref 0–37)
Albumin: 4.2 g/dL (ref 3.5–5.2)
Alkaline Phosphatase: 100 U/L (ref 39–117)
BILIRUBIN TOTAL: 1.2 mg/dL (ref 0.2–1.2)
BUN: 17 mg/dL (ref 6–23)
CALCIUM: 9.3 mg/dL (ref 8.4–10.5)
CHLORIDE: 100 meq/L (ref 96–112)
CO2: 29 meq/L (ref 19–32)
Creatinine, Ser: 0.77 mg/dL (ref 0.40–1.20)
GFR: 76.44 mL/min (ref >60.00)
Glucose, Bld: 89 mg/dL (ref 70–99)
Potassium: 5 mEq/L (ref 3.5–5.1)
Sodium: 135 mEq/L (ref 135–145)
Total Protein: 6.9 g/dL (ref 6.0–8.3)

## 2016-09-18 LAB — CBC
HEMATOCRIT: 37.8 % (ref 36.0–46.0)
HEMOGLOBIN: 12.1 g/dL (ref 12.0–15.0)
MCHC: 32 g/dL (ref 30.0–36.0)
MCV: 75.2 fl — AB (ref 78.0–100.0)
PLATELETS: 277 10*3/uL (ref 150.0–400.0)
RBC: 5.02 Mil/uL (ref 3.87–5.11)
RDW: 18.7 % — ABNORMAL HIGH (ref 11.5–15.5)
WBC: 8.9 10*3/uL (ref 4.0–10.5)

## 2016-09-18 LAB — FERRITIN: FERRITIN: 17.8 ng/mL (ref 10.0–291.0)

## 2016-09-18 LAB — HEMOGLOBIN A1C: Hgb A1c MFr Bld: 6.2 % (ref 4.6–6.5)

## 2016-09-18 NOTE — Progress Notes (Signed)
Subjective:  [name] is a 82 y.o. year old very pleasant female patient who presents for/with See problem oriented charting ROS- no chest pain or blurry vision or headaches or dizziness.see any ROS included in HPI as well.   Past Medical History-  Patient Active Problem List   Diagnosis Date Noted  . Anemia, iron deficiency 05/15/2016    Priority: High  . Non-proliferative diabetic retinopathy, mild, left eye ([place]) 10/10/2015    Priority: High  . Atrial fibrillation ([place]) 01/08/2014    Priority: High  . Diastolic CHF, chronic ([place]) 01/02/2014    Priority: High  . Type II diabetes mellitus with ophthalmic manifestations ([place]) 01/02/2014    Priority: High  . Coronary artery disease     Priority: High  . Carotid artery stenosis 04/01/2012    Priority: High  . Depression 03/25/2015    Priority: Medium  . HTN (hypertension) 08/29/2011    Priority: Medium  . Hyperlipidemia 07/18/2011    Priority: Medium  . Allergic rhinitis 11/19/2015    Priority: Low  . Edema leg 05/26/2015    Priority: Low  . GERD (gastroesophageal reflux disease) 03/25/2015    Priority: Low  . Long term current use of anticoagulant therapy 01/08/2014    Priority: Low  . Lichen sclerosus     Priority: Low  . Obesities, morbid ([place]) 07/18/2011    Priority: Low  . Spinal stenosis     Medications- reviewed and updated Current Outpatient Prescriptions  Medication Sig Dispense Refill  . ALPRAZolam (XANAX) 1 MG tablet Take by mouth.    [name] FLUoxetine (PROZAC) 20 MG tablet Take 20 mg by mouth 3 (three) times a week.     . Loratadine 10 MG CAPS Take 1 capsule (10 mg total) by mouth daily. 30 each 5  . losartan (COZAAR) 50 MG tablet TAKE 1 TABLET (50 MG TOTAL) BY MOUTH 2 (TWO) TIMES DAILY. 60 tablet 6  . metFORMIN (GLUCOPHAGE) 500 MG tablet TAKE 1 TABLET (500 MG TOTAL) BY MOUTH DAILY WITH BREAKFAST. 30 tablet 5  . metoprolol succinate (TOPROL-XL) 25 MG 24 hr tablet TAKE 1 TABLET(S) BY MOUTH DAILY 30 tablet  8  . nitroGLYCERIN (NITROSTAT) 0.4 MG SL tablet Place 1 tablet (0.4 mg total) under the tongue every 5 (five) minutes as needed. For chest pain 25 tablet 2  . warfarin (COUMADIN) 5 MG tablet TAKE 1 TO 1.5 TABLETS BY MOUTH DAILY AS DIRECTED BY COUMADIN CLINIC 40 tablet 3  . acetaminophen (TYLENOL) 500 MG tablet Take 1,000 mg by mouth. Reported on 02/21/2016    . albuterol (PROVENTIL HFA;VENTOLIN HFA) 108 (90 BASE) MCG/ACT inhaler Inhale 2 puffs into the lungs every 6 (six) hours as needed for wheezing or shortness of breath. (Patient not taking: Reported on 09/18/2016) 1 Inhaler 0  . clobetasol cream (TEMOVATE) 0.05 % Apply nightly as needed for irritation (Patient not taking: Reported on 09/18/2016) 60 g 1  . omeprazole (PRILOSEC) 20 MG capsule Take 1 capsule (20 mg total) by mouth daily. (Patient taking differently: Take 20 mg by mouth. Take One tablet every other day) 90 capsule 3   No current facility-administered medications for this visit.     Objective: BP 122/84 (BP Location: Left Arm, Patient Position: Sitting, Cuff Size: Large)   Pulse 77   Temp 98.8 F (37.1 C) (Oral)   Wt 211 lb 9.6 oz (96 kg)   SpO2 94%   BMI 32.17 kg/m  Gen: NAD, resting comfortably CV: irregularly irregular no murmurs rubs  or gallops Lungs: CTAB perhaps faint crackles in bases crackles, wheeze, rhonchi Abdomen: soft/nontender/nondistended/normal bowel sounds.  Ext: 1+ edema Skin: warm, dry Neuro: grossly normal, moves all extremities  Assessment/Plan:  Diastolic CHF, chronic (HCC) S: has been taking lasix prn about 2x a week. She has noted that she has had some worsening fatigue and mild worsening of shortness of breath. Oddly Doing exercise class and does well with it. Feels better with exercise class. Weight is up 2 lbs from last visit and 5 lbs from visit before that. Swelling has increased in same time fame of last few weeks that fatigue and SOB has.  A/P: Lasix really gets in way of her daily  activities. She is agreeable to try every other day and follow up in 2 weeks.   Anemia, iron deficiency S: patient states she hasAbdominal pain on daily iron. Prior could not tolerate twice a day iron. She is on both asa and coumadin and has AVM history A/P:Every other day trial of iron along with lasix. We may have to consider adjusting anticoagulant and anti platelet. Ferritin remains low normal but not anemic at this time Lab Results  Component Value Date   WBC 8.9 09/18/2016   HGB 12.1 09/18/2016   HCT 37.8 09/18/2016   MCV 75.2 (L) 09/18/2016   PLT 277.0 09/18/2016      Type II diabetes mellitus with ophthalmic manifestations ([place]) S: had not checked a1c in some time. She does not heck sugars regularly. Does take metformin 500mg  daily Lab Results  Component Value Date   HGBA1C 6.2 09/18/2016  A/P:fortunately a1c shows excellent control- continue to monitor    Return in about 2 weeks (around 10/02/2016) for follow up- or sooner if needed.  Orders Placed This Encounter  Procedures  . Flu vaccine HIGH DOSE PF  . CBC    [place]  . Comprehensive metabolic panel    [place]  . Hemoglobin A1c    [place]  . Ferritin   We also repeated bilirubin which normalized.  Return precautions advised.  [name], MD

## 2016-09-18 NOTE — Progress Notes (Signed)
Pre visit review using our clinic review tool, if applicable. No additional management support is needed unless otherwise documented below in the visit note. 

## 2016-09-18 NOTE — Patient Instructions (Addendum)
Flu shot today  Increase lasix and iron to every other day  Labs before you go  Follow up 2 weeks- schedule before you leave. Sooner if you have shortness of breath or fatigue worsen.

## 2016-09-19 NOTE — Assessment & Plan Note (Signed)
S: had not checked a1c in some time. She does not heck sugars regularly. Does take metformin 500mg  daily Lab Results  Component Value Date   HGBA1C 6.2 09/18/2016  A/P:fortunately a1c shows excellent control- continue to monitor

## 2016-09-19 NOTE — Assessment & Plan Note (Addendum)
S: patient states she hasAbdominal pain on daily iron. Prior could not tolerate twice a day iron. She is on both asa and coumadin and has AVM history A/P:Every other day trial of iron along with lasix. We may have to consider adjusting anticoagulant and anti platelet. Ferritin remains low normal but not anemic at this time Lab Results  Component Value Date   WBC 8.9 09/18/2016   HGB 12.1 09/18/2016   HCT 37.8 09/18/2016   MCV 75.2 (L) 09/18/2016   PLT 277.0 09/18/2016

## 2016-09-19 NOTE — Assessment & Plan Note (Signed)
S: has been taking lasix prn about 2x a week. She has noted that she has had some worsening fatigue and mild worsening of shortness of breath. Oddly Doing exercise class and does well with it. Feels better with exercise class. Weight is up 2 lbs from last visit and 5 lbs from visit before that. Swelling has increased in same time fame of last few weeks that fatigue and SOB has.  A/P: Lasix really gets in way of her daily activities. She is agreeable to try every other day and follow up in 2 weeks.

## 2016-09-27 ENCOUNTER — Ambulatory Visit (INDEPENDENT_AMBULATORY_CARE_PROVIDER_SITE_OTHER): Payer: Medicare Other | Admitting: Cardiovascular Disease

## 2016-09-27 DIAGNOSIS — Z7901 Long term (current) use of anticoagulants: Secondary | ICD-10-CM | POA: Diagnosis not present

## 2016-09-27 DIAGNOSIS — I482 Chronic atrial fibrillation, unspecified: Secondary | ICD-10-CM

## 2016-09-27 DIAGNOSIS — I4891 Unspecified atrial fibrillation: Secondary | ICD-10-CM | POA: Diagnosis not present

## 2016-09-27 LAB — PROTIME-INR: INR: 2.4 — AB (ref 0.9–1.1)

## 2016-10-11 ENCOUNTER — Ambulatory Visit (INDEPENDENT_AMBULATORY_CARE_PROVIDER_SITE_OTHER): Payer: Medicare Other | Admitting: Podiatry

## 2016-10-11 DIAGNOSIS — M79675 Pain in left toe(s): Secondary | ICD-10-CM

## 2016-10-11 DIAGNOSIS — L84 Corns and callosities: Secondary | ICD-10-CM | POA: Diagnosis not present

## 2016-10-11 DIAGNOSIS — B351 Tinea unguium: Secondary | ICD-10-CM | POA: Diagnosis not present

## 2016-10-11 NOTE — Progress Notes (Signed)
Patient ID: [name], female   DOB: 1933/01/30, 82 y.o.   MRN: [id number] Complaint:  Visit Type: Patient returns to my office for continued preventative foot care services. Complaint: Patient states" my nails have grown long and thick and become painful to walk and wear shoes. The patient presents for preventative foot care services. No changes to ROS  Podiatric Exam: Vascular: dorsalis pedis and posterior tibial pulses are palpable bilateral. Capillary return is immediate. Temperature gradient is WNL. Skin turgor WNL  Sensorium: Normal Semmes Weinstein monofilament test. Normal tactile sensation bilaterally. Nail Exam: Pt has thick disfigured discolored nails with subungual debris noted bilateral entire nail hallux through fifth toenails Ulcer Exam: There is no evidence of ulcer or pre-ulcerative changes or infection. Orthopedic Exam: Muscle tone and strength are WNL. No limitations in general ROM. No crepitus or effusions noted. Foot type and digits show no abnormalities. Bony prominences are unremarkable. Skin: No Porokeratosis. No infection or ulcers.  Corn 4th right and distal clavi 3rd left.  Diagnosis:  Onychomycosis, , Pain in right toe, pain in left toes, Debride callus  Treatment & Plan Procedures and Treatment: Consent by patient was obtained for treatment procedures. The patient understood the discussion of treatment and procedures well. All questions were answered thoroughly reviewed. Debridement of mycotic and hypertrophic toenails, 1 through 5 bilateral and clearing of subungual debris. No ulceration, no infection noted.  Return Visit-Office Procedure: Patient instructed to return to the office for a follow up visit 3 months for continued evaluation and treatment.  Gardiner Barefoot DPM

## 2016-10-16 DIAGNOSIS — H35372 Puckering of macula, left eye: Secondary | ICD-10-CM | POA: Diagnosis not present

## 2016-10-16 DIAGNOSIS — Z961 Presence of intraocular lens: Secondary | ICD-10-CM | POA: Diagnosis not present

## 2016-10-16 DIAGNOSIS — E119 Type 2 diabetes mellitus without complications: Secondary | ICD-10-CM | POA: Diagnosis not present

## 2016-10-16 DIAGNOSIS — H524 Presbyopia: Secondary | ICD-10-CM | POA: Diagnosis not present

## 2016-10-16 LAB — HM DIABETES EYE EXAM

## 2016-10-19 ENCOUNTER — Encounter: Payer: Self-pay | Admitting: Family Medicine

## 2016-10-21 ENCOUNTER — Other Ambulatory Visit: Payer: Self-pay | Admitting: Family Medicine

## 2016-10-25 ENCOUNTER — Ambulatory Visit (INDEPENDENT_AMBULATORY_CARE_PROVIDER_SITE_OTHER): Payer: Medicare Other | Admitting: Cardiovascular Disease

## 2016-10-25 DIAGNOSIS — I4891 Unspecified atrial fibrillation: Secondary | ICD-10-CM | POA: Diagnosis not present

## 2016-10-25 DIAGNOSIS — I482 Chronic atrial fibrillation, unspecified: Secondary | ICD-10-CM

## 2016-10-25 DIAGNOSIS — Z7901 Long term (current) use of anticoagulants: Secondary | ICD-10-CM | POA: Diagnosis not present

## 2016-10-25 LAB — PROTIME-INR: INR: 2.5 — AB (ref 0.9–1.1)

## 2016-10-26 ENCOUNTER — Ambulatory Visit (INDEPENDENT_AMBULATORY_CARE_PROVIDER_SITE_OTHER): Payer: Medicare Other | Admitting: Family Medicine

## 2016-10-26 ENCOUNTER — Encounter: Payer: Self-pay | Admitting: Family Medicine

## 2016-10-26 DIAGNOSIS — I6523 Occlusion and stenosis of bilateral carotid arteries: Secondary | ICD-10-CM

## 2016-10-26 DIAGNOSIS — I5032 Chronic diastolic (congestive) heart failure: Secondary | ICD-10-CM

## 2016-10-26 DIAGNOSIS — D509 Iron deficiency anemia, unspecified: Secondary | ICD-10-CM | POA: Diagnosis not present

## 2016-10-26 NOTE — Progress Notes (Signed)
Pre visit review using our clinic review tool, if applicable. No additional management support is needed unless otherwise documented below in the visit note. 

## 2016-10-26 NOTE — Progress Notes (Signed)
Subjective:  [name] is a 82 y.o. year old very pleasant female patient who presents for/with See problem oriented charting ROS- No chest pain. shortness of breath with walking down hall has resolved. No headache or blurry vision. Mild cough/uri symptoms. .see any ROS included in HPI as well.   Past Medical History-  Patient Active Problem List   Diagnosis Date Noted  . Anemia, iron deficiency 05/15/2016    Priority: High  . Non-proliferative diabetic retinopathy, mild, left eye ([place]) 10/10/2015    Priority: High  . Atrial fibrillation ([place]) 01/08/2014    Priority: High  . Diastolic CHF, chronic ([place]) 01/02/2014    Priority: High  . Type II diabetes mellitus with ophthalmic manifestations ([place]) 01/02/2014    Priority: High  . Coronary artery disease     Priority: High  . Carotid artery stenosis 04/01/2012    Priority: High  . Depression 03/25/2015    Priority: Medium  . HTN (hypertension) 08/29/2011    Priority: Medium  . Hyperlipidemia 07/18/2011    Priority: Medium  . Allergic rhinitis 11/19/2015    Priority: Low  . Edema leg 05/26/2015    Priority: Low  . GERD (gastroesophageal reflux disease) 03/25/2015    Priority: Low  . Long term current use of anticoagulant therapy 01/08/2014    Priority: Low  . Lichen sclerosus     Priority: Low  . Obesities, morbid ([place]) 07/18/2011    Priority: Low  . Spinal stenosis     Medications- reviewed and updated Current Outpatient Prescriptions  Medication Sig Dispense Refill  . acetaminophen (TYLENOL) 500 MG tablet Take 1,000 mg by mouth. Reported on 02/21/2016    . albuterol (PROVENTIL HFA;VENTOLIN HFA) 108 (90 BASE) MCG/ACT inhaler Inhale 2 puffs into the lungs every 6 (six) hours as needed for wheezing or shortness of breath. 1 Inhaler 0  . ALPRAZolam (XANAX) 1 MG tablet Take by mouth.    . clobetasol cream (TEMOVATE) 0.05 % Apply nightly as needed for irritation 60 g 1  . FLUoxetine (PROZAC) 20 MG tablet Take 20 mg by  mouth 3 (three) times a week.     . Loratadine 10 MG CAPS Take 1 capsule (10 mg total) by mouth daily. 30 each 5  . losartan (COZAAR) 50 MG tablet TAKE 1 TABLET (50 MG TOTAL) BY MOUTH 2 (TWO) TIMES DAILY. 60 tablet 6  . metFORMIN (GLUCOPHAGE) 500 MG tablet TAKE 1 TABLET (500 MG TOTAL) BY MOUTH DAILY WITH BREAKFAST. 30 tablet 4  . metoprolol succinate (TOPROL-XL) 25 MG 24 hr tablet TAKE 1 TABLET(S) BY MOUTH DAILY 30 tablet 8  . nitroGLYCERIN (NITROSTAT) 0.4 MG SL tablet Place 1 tablet (0.4 mg total) under the tongue every 5 (five) minutes as needed. For chest pain 25 tablet 2  . warfarin (COUMADIN) 5 MG tablet TAKE 1 TO 1.5 TABLETS BY MOUTH DAILY AS DIRECTED BY COUMADIN CLINIC 40 tablet 3  . omeprazole (PRILOSEC) 20 MG capsule Take 1 capsule (20 mg total) by mouth daily. (Patient taking differently: Take 20 mg by mouth. Take One tablet every other day) 90 capsule 3   No current facility-administered medications for this visit.     Objective: BP 102/76 (BP Location: Left Arm, Patient Position: Sitting, Cuff Size: Normal)   Pulse 74   Temp 98.6 F (37 C) (Oral)   Wt 206 lb 3.2 oz (93.5 kg)   SpO2 93%   BMI 31.35 kg/m  Gen: NAD, resting comfortably Mild rhinorrhea, oropharynx normal CV: Irregularly irregular  no murmurs rubs or gallops Lungs: CTAB no crackles, wheeze, rhonchi Abdomen: soft/nontender/nondistended/normal bowel sounds. No rebound or guarding.  Ext: edema 1+ on right, trace on left.  Skin: warm, dry, no rash  Assessment/Plan:  Diastolic CHF, chronic (HCC) S: started lasix every other day started about a month ago. Weight down 5 lbs. Had only been doing 2x a week. Fatigue and SOB reported last visit. Edema had increased as well  Patient states with this regimen she has felt much better. Able to walk down the hall without difficulty, swelling down some, fatigue decreased. Weight down on our scales Wt Readings from Last 3 Encounters:  10/26/16 206 lb 3.2 oz (93.5 kg)   09/18/16 211 lb 9.6 oz (96 kg)  08/28/16 209 lb (94.8 kg)   A/P: advised continued use of every other day lasix despite annoyances with frequent urination as is doing better job managing her fluid status and improving her symptoms  Anemia, iron deficiency S: Every other day iron advised last visit. She has been able to tolerate every other day dosing A/P: for now declines CBC, ferritin but we can do this at follow up suspect most of symptomati improvement due to Improved CHF control but improvement in hemoglobin may also be helping her    mild URI symptoms- discussed symptomatic care.   Declines diabetic foot exam- agrees to next visit  Return in about 4 months (around 02/26/2017).  Return precautions advised.  [name], MD

## 2016-10-26 NOTE — Patient Instructions (Signed)
Continue every other day lasix and iron pill  Dextromethorphan also known as delsym is a good choice for cough  Health Maintenance Due  Topic Date Due  . TETANUS/TDAP - need this if you get a cut or scrape  07/18/1954  . FOOT EXAM - next time 04/07/2016

## 2016-10-27 NOTE — Assessment & Plan Note (Signed)
S: started lasix every other day started about a month ago. Weight down 5 lbs. Had only been doing 2x a week. Fatigue and SOB reported last visit. Edema had increased as well  Patient states with this regimen she has felt much better. Able to walk down the hall without difficulty, swelling down some, fatigue decreased. Weight down on our scales Wt Readings from Last 3 Encounters:  10/26/16 206 lb 3.2 oz (93.5 kg)  09/18/16 211 lb 9.6 oz (96 kg)  08/28/16 209 lb (94.8 kg)   A/P: advised continued use of every other day lasix despite annoyances with frequent urination as is doing better job managing her fluid status and improving her symptoms

## 2016-10-27 NOTE — Assessment & Plan Note (Signed)
S: Every other day iron advised last visit. She has been able to tolerate every other day dosing A/P: for now declines CBC, ferritin but we can do this at follow up suspect most of symptomati improvement due to Improved CHF control but improvement in hemoglobin may also be helping her

## 2016-11-15 ENCOUNTER — Other Ambulatory Visit: Payer: Self-pay | Admitting: Gynecology

## 2016-11-16 DIAGNOSIS — F4001 Agoraphobia with panic disorder: Secondary | ICD-10-CM | POA: Diagnosis not present

## 2016-11-28 ENCOUNTER — Encounter (HOSPITAL_COMMUNITY): Payer: Self-pay | Admitting: *Deleted

## 2016-11-28 ENCOUNTER — Inpatient Hospital Stay (HOSPITAL_COMMUNITY)
Admission: EM | Admit: 2016-11-28 | Discharge: 2016-12-04 | DRG: 292 | Disposition: A | Discharge status: Home / Self Care | Payer: Medicare Other | Attending: Cardiology | Admitting: Cardiology

## 2016-11-28 ENCOUNTER — Telehealth: Payer: Self-pay | Admitting: Cardiology

## 2016-11-28 ENCOUNTER — Emergency Department (HOSPITAL_COMMUNITY): Payer: Medicare Other

## 2016-11-28 DIAGNOSIS — N183 Chronic kidney disease, stage 3 (moderate): Secondary | ICD-10-CM

## 2016-11-28 DIAGNOSIS — I130 Hypertensive heart and chronic kidney disease with heart failure and stage 1 through stage 4 chronic kidney disease, or unspecified chronic kidney disease: Secondary | ICD-10-CM | POA: Diagnosis present

## 2016-11-28 DIAGNOSIS — I5033 Acute on chronic diastolic (congestive) heart failure: Secondary | ICD-10-CM | POA: Diagnosis not present

## 2016-11-28 DIAGNOSIS — R079 Chest pain, unspecified: Secondary | ICD-10-CM | POA: Diagnosis not present

## 2016-11-28 DIAGNOSIS — J449 Chronic obstructive pulmonary disease, unspecified: Secondary | ICD-10-CM | POA: Diagnosis present

## 2016-11-28 DIAGNOSIS — I25119 Atherosclerotic heart disease of native coronary artery with unspecified angina pectoris: Secondary | ICD-10-CM | POA: Diagnosis not present

## 2016-11-28 DIAGNOSIS — I10 Essential (primary) hypertension: Secondary | ICD-10-CM

## 2016-11-28 DIAGNOSIS — L900 Lichen sclerosus et atrophicus: Secondary | ICD-10-CM | POA: Diagnosis present

## 2016-11-28 DIAGNOSIS — I110 Hypertensive heart disease with heart failure: Secondary | ICD-10-CM | POA: Diagnosis not present

## 2016-11-28 DIAGNOSIS — I4891 Unspecified atrial fibrillation: Secondary | ICD-10-CM | POA: Diagnosis present

## 2016-11-28 DIAGNOSIS — N390 Urinary tract infection, site not specified: Secondary | ICD-10-CM | POA: Diagnosis not present

## 2016-11-28 DIAGNOSIS — Z961 Presence of intraocular lens: Secondary | ICD-10-CM | POA: Diagnosis present

## 2016-11-28 DIAGNOSIS — Z7901 Long term (current) use of anticoagulants: Secondary | ICD-10-CM | POA: Diagnosis not present

## 2016-11-28 DIAGNOSIS — I959 Hypotension, unspecified: Secondary | ICD-10-CM | POA: Diagnosis not present

## 2016-11-28 DIAGNOSIS — Z955 Presence of coronary angioplasty implant and graft: Secondary | ICD-10-CM

## 2016-11-28 DIAGNOSIS — R0602 Shortness of breath: Secondary | ICD-10-CM | POA: Diagnosis not present

## 2016-11-28 DIAGNOSIS — Z882 Allergy status to sulfonamides status: Secondary | ICD-10-CM

## 2016-11-28 DIAGNOSIS — M479 Spondylosis, unspecified: Secondary | ICD-10-CM | POA: Diagnosis present

## 2016-11-28 DIAGNOSIS — Z888 Allergy status to other drugs, medicaments and biological substances status: Secondary | ICD-10-CM

## 2016-11-28 DIAGNOSIS — Z9842 Cataract extraction status, left eye: Secondary | ICD-10-CM

## 2016-11-28 DIAGNOSIS — E119 Type 2 diabetes mellitus without complications: Secondary | ICD-10-CM | POA: Diagnosis present

## 2016-11-28 DIAGNOSIS — Z8249 Family history of ischemic heart disease and other diseases of the circulatory system: Secondary | ICD-10-CM

## 2016-11-28 DIAGNOSIS — I482 Chronic atrial fibrillation: Secondary | ICD-10-CM | POA: Diagnosis not present

## 2016-11-28 DIAGNOSIS — D649 Anemia, unspecified: Secondary | ICD-10-CM | POA: Diagnosis present

## 2016-11-28 DIAGNOSIS — I081 Rheumatic disorders of both mitral and tricuspid valves: Secondary | ICD-10-CM | POA: Diagnosis not present

## 2016-11-28 DIAGNOSIS — I509 Heart failure, unspecified: Secondary | ICD-10-CM

## 2016-11-28 DIAGNOSIS — I2510 Atherosclerotic heart disease of native coronary artery without angina pectoris: Secondary | ICD-10-CM | POA: Diagnosis present

## 2016-11-28 DIAGNOSIS — Z7984 Long term (current) use of oral hypoglycemic drugs: Secondary | ICD-10-CM

## 2016-11-28 DIAGNOSIS — Z9841 Cataract extraction status, right eye: Secondary | ICD-10-CM

## 2016-11-28 DIAGNOSIS — Z6831 Body mass index (BMI) 31.0-31.9, adult: Secondary | ICD-10-CM

## 2016-11-28 DIAGNOSIS — Z951 Presence of aortocoronary bypass graft: Secondary | ICD-10-CM

## 2016-11-28 DIAGNOSIS — Z87891 Personal history of nicotine dependence: Secondary | ICD-10-CM | POA: Diagnosis not present

## 2016-11-28 DIAGNOSIS — R0789 Other chest pain: Secondary | ICD-10-CM | POA: Diagnosis not present

## 2016-11-28 DIAGNOSIS — E669 Obesity, unspecified: Secondary | ICD-10-CM | POA: Diagnosis present

## 2016-11-28 DIAGNOSIS — E785 Hyperlipidemia, unspecified: Secondary | ICD-10-CM | POA: Diagnosis present

## 2016-11-28 DIAGNOSIS — M419 Scoliosis, unspecified: Secondary | ICD-10-CM | POA: Diagnosis present

## 2016-11-28 LAB — COMPREHENSIVE METABOLIC PANEL
ALBUMIN: 4 g/dL (ref 3.5–5.0)
ALK PHOS: 88 U/L (ref 38–126)
ALT: 11 U/L — ABNORMAL LOW (ref 14–54)
AST: 21 U/L (ref 15–41)
Anion gap: 9 (ref 5–15)
BILIRUBIN TOTAL: 1.3 mg/dL — AB (ref 0.3–1.2)
BUN: 13 mg/dL (ref 6–20)
CALCIUM: 9.3 mg/dL (ref 8.9–10.3)
CO2: 26 mmol/L (ref 22–32)
Chloride: 102 mmol/L (ref 101–111)
Creatinine, Ser: 0.85 mg/dL (ref 0.44–1.00)
GFR calc Af Amer: >60 mL/min (ref >60)
GFR calc non Af Amer: >60 mL/min (ref >60)
GLUCOSE: 92 mg/dL (ref 65–99)
Potassium: 4.7 mmol/L (ref 3.5–5.1)
Sodium: 137 mmol/L (ref 135–145)
TOTAL PROTEIN: 6.9 g/dL (ref 6.5–8.1)

## 2016-11-28 LAB — CBC WITH DIFFERENTIAL/PLATELET
Basophils Absolute: 0 10*3/uL (ref 0.0–0.1)
Basophils Relative: 0 %
Eosinophils Absolute: 0 10*3/uL (ref 0.0–0.7)
Eosinophils Relative: 1 %
HCT: 39.1 % (ref 36.0–46.0)
HEMOGLOBIN: 12.1 g/dL (ref 12.0–15.0)
LYMPHS ABS: 0.8 10*3/uL (ref 0.7–4.0)
LYMPHS PCT: 11 %
MCH: 24.3 pg — AB (ref 26.0–34.0)
MCHC: 30.9 g/dL (ref 30.0–36.0)
MCV: 78.7 fL (ref 78.0–100.0)
Monocytes Absolute: 0.6 10*3/uL (ref 0.1–1.0)
Monocytes Relative: 7 %
NEUTROS ABS: 6.3 10*3/uL (ref 1.7–7.7)
Neutrophils Relative %: 81 %
Platelets: 247 10*3/uL (ref 150–400)
RBC: 4.97 MIL/uL (ref 3.87–5.11)
RDW: 18.5 % — ABNORMAL HIGH (ref 11.5–15.5)
WBC: 7.7 10*3/uL (ref 4.0–10.5)

## 2016-11-28 LAB — PROTIME-INR
INR: 3.08
Prothrombin Time: 32.5 seconds — ABNORMAL HIGH (ref 11.4–15.2)

## 2016-11-28 LAB — I-STAT TROPONIN, ED: Troponin i, poc: 0.01 ng/mL (ref 0.00–0.08)

## 2016-11-28 LAB — GLUCOSE, CAPILLARY: Glucose-Capillary: 127 mg/dL — ABNORMAL HIGH (ref 65–99)

## 2016-11-28 LAB — BRAIN NATRIURETIC PEPTIDE: B Natriuretic Peptide: 1268.9 pg/mL — ABNORMAL HIGH (ref 0.0–100.0)

## 2016-11-28 MED ORDER — ASPIRIN 81 MG PO CHEW
324.0000 mg | CHEWABLE_TABLET | Freq: Once | ORAL | Status: AC
  Administered 2016-11-28: 324 mg via ORAL
  Filled 2016-11-28: qty 4

## 2016-11-28 MED ORDER — METOPROLOL SUCCINATE ER 25 MG PO TB24
25.0000 mg | ORAL_TABLET | Freq: Every day | ORAL | Status: DC
  Administered 2016-11-28 – 2016-12-03 (×6): 25 mg via ORAL
  Filled 2016-11-28 (×7): qty 1

## 2016-11-28 MED ORDER — SODIUM CHLORIDE 0.9 % IV SOLN: 250.0000 mL | INTRAVENOUS | Status: DC | PRN

## 2016-11-28 MED ORDER — ONDANSETRON HCL 4 MG/2ML IJ SOLN
4.0000 mg | Freq: Four times a day (QID) | INTRAMUSCULAR | Status: DC | PRN
  Administered 2016-12-02: 4 mg via INTRAVENOUS
  Filled 2016-11-28: qty 2

## 2016-11-28 MED ORDER — FUROSEMIDE 10 MG/ML IJ SOLN
40.0000 mg | Freq: Two times a day (BID) | INTRAMUSCULAR | Status: DC
  Administered 2016-11-29 – 2016-12-03 (×9): 40 mg via INTRAVENOUS
  Filled 2016-11-28 (×9): qty 4

## 2016-11-28 MED ORDER — ALPRAZOLAM 0.5 MG PO TABS
1.0000 mg | ORAL_TABLET | Freq: Every day | ORAL | Status: DC | PRN
  Administered 2016-11-29 – 2016-12-03 (×4): 1 mg via ORAL
  Filled 2016-11-28 (×4): qty 2

## 2016-11-28 MED ORDER — ACETAMINOPHEN 325 MG PO TABS
650.0000 mg | ORAL_TABLET | ORAL | Status: DC | PRN
  Administered 2016-12-03 (×2): 650 mg via ORAL
  Filled 2016-11-28 (×2): qty 2

## 2016-11-28 MED ORDER — SODIUM CHLORIDE 0.9% FLUSH
3.0000 mL | Freq: Two times a day (BID) | INTRAVENOUS | Status: DC
  Administered 2016-11-28 – 2016-12-03 (×8): 3 mL via INTRAVENOUS

## 2016-11-28 MED ORDER — FUROSEMIDE 10 MG/ML IJ SOLN
40.0000 mg | Freq: Once | INTRAMUSCULAR | Status: AC
  Administered 2016-11-28: 40 mg via INTRAVENOUS
  Filled 2016-11-28: qty 4

## 2016-11-28 MED ORDER — LOSARTAN POTASSIUM 50 MG PO TABS
50.0000 mg | ORAL_TABLET | Freq: Every day | ORAL | Status: DC
  Filled 2016-11-28: qty 1

## 2016-11-28 MED ORDER — SODIUM CHLORIDE 0.9% FLUSH
3.0000 mL | INTRAVENOUS | Status: DC | PRN
  Administered 2016-12-02 – 2016-12-03 (×2): 3 mL via INTRAVENOUS
  Filled 2016-11-28 (×2): qty 3

## 2016-11-28 MED ORDER — POTASSIUM CHLORIDE CRYS ER 10 MEQ PO TBCR
10.0000 meq | EXTENDED_RELEASE_TABLET | Freq: Every day | ORAL | Status: DC
Start: 2016-11-28 — End: 2016-12-04
  Administered 2016-11-28 – 2016-12-04 (×7): 10 meq via ORAL
  Filled 2016-11-28 (×7): qty 1

## 2016-11-28 MED ORDER — INSULIN ASPART 100 UNIT/ML ~~LOC~~ SOLN
0.0000 [IU] | Freq: Three times a day (TID) | SUBCUTANEOUS | Status: DC
  Administered 2016-11-29 – 2016-12-02 (×2): 2 [IU] via SUBCUTANEOUS

## 2016-11-28 MED ORDER — WARFARIN - PHARMACIST DOSING INPATIENT
Freq: Every day | Status: DC
  Administered 2016-11-30 – 2016-12-01 (×2)
  Administered 2016-12-02: 1

## 2016-11-28 NOTE — ED Notes (Signed)
Up using the bedside commode

## 2016-11-28 NOTE — ED Notes (Signed)
To x-ray

## 2016-11-28 NOTE — ED Notes (Signed)
Aspirin not given she received aspirin on the way here by ems  324mg 

## 2016-11-28 NOTE — Telephone Encounter (Signed)
Spoke to patient. She also had me speak to RN from [facility]. Notes that Dr. Ansel Bong office had already recommended ER evaluation for her symptoms, she was seeking 2nd opinion. She reports palps x 2 days, now SOB at rest & fatigue at rest today. Dyspnea was somewhat relieved by nitro. She is also c/o "slight" pressure on chest, unresolved. Denies other pain. I affirmed advice to go to ED. She voiced understanding and will follow instructions. She's aware that due to acuity of symptoms she should go there rather than the medical office.  I have called Trish and alerted her of tentative patient arrival from home.
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
## 2016-11-28 NOTE — Telephone Encounter (Signed)
New message      Pt c/o Shortness Of Breath: STAT if SOB developed within the last 24 hours or pt is noticeably SOB on the phone  1. Are you currently SOB (can you hear that pt is SOB on the phone)?  no 2. How long have you been experiencing SOB? 1 day  3. Are you SOB when sitting or when up moving around?  sitting 4. Are you currently experiencing any other symptoms?   Pt took an nitro 1 hr ago and it has helped.  [facility]. bp is 182-92, oxygen 93%, pulse 72
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
## 2016-11-28 NOTE — ED Notes (Signed)
Pt voided no pain  Sister at the bedside

## 2016-11-28 NOTE — Progress Notes (Signed)
   11/28/16 2132  Vitals  Temp 98.6 F (37 C)  Temp Source Oral  BP (!) 142/87  BP Location Left Arm  BP Method Automatic  Patient Position (if appropriate) Sitting  Pulse Rate 76  Pulse Rate Source Dinamap  Resp 18  Oxygen Therapy  SpO2 94 %  O2 Device Room Air  Height and Weight  Height 5' 8.5" (1.74 m)  Weight 95.4 kg (210 lb 6.4 oz) (a scale)  Type of Scale Used Standing  Type of Weight Actual  BSA (Calculated - sq m) 2.15 sq meters  BMI (Calculated) 31.6  Weight in (lb) to have BMI = 25 166.5  admitted pt to rm 3E03 from ED, pt alert an oriented, denied pain at this time, oriented to room, call bell placed within reach, CCMD made aware.

## 2016-11-28 NOTE — ED Triage Notes (Signed)
Pt arrives from home via [facility]. Pt states she has been having intermittent chest pressure and significant shortness of breath since yesterday. Pt states she took 1 nitro and the pressure has now resolved. Pt received 324mg  of ASA pta. Dr. Billy Fischer informed and handed EKG.

## 2016-11-29 ENCOUNTER — Observation Stay (HOSPITAL_BASED_OUTPATIENT_CLINIC_OR_DEPARTMENT_OTHER): Payer: Medicare Other

## 2016-11-29 DIAGNOSIS — Z6831 Body mass index (BMI) 31.0-31.9, adult: Secondary | ICD-10-CM | POA: Diagnosis not present

## 2016-11-29 DIAGNOSIS — N390 Urinary tract infection, site not specified: Secondary | ICD-10-CM | POA: Diagnosis not present

## 2016-11-29 DIAGNOSIS — D649 Anemia, unspecified: Secondary | ICD-10-CM | POA: Diagnosis present

## 2016-11-29 DIAGNOSIS — I4891 Unspecified atrial fibrillation: Secondary | ICD-10-CM | POA: Diagnosis not present

## 2016-11-29 DIAGNOSIS — Z888 Allergy status to other drugs, medicaments and biological substances status: Secondary | ICD-10-CM | POA: Diagnosis not present

## 2016-11-29 DIAGNOSIS — Z882 Allergy status to sulfonamides status: Secondary | ICD-10-CM | POA: Diagnosis not present

## 2016-11-29 DIAGNOSIS — I482 Chronic atrial fibrillation: Secondary | ICD-10-CM

## 2016-11-29 DIAGNOSIS — I509 Heart failure, unspecified: Secondary | ICD-10-CM

## 2016-11-29 DIAGNOSIS — Z8249 Family history of ischemic heart disease and other diseases of the circulatory system: Secondary | ICD-10-CM | POA: Diagnosis not present

## 2016-11-29 DIAGNOSIS — Z951 Presence of aortocoronary bypass graft: Secondary | ICD-10-CM | POA: Diagnosis not present

## 2016-11-29 DIAGNOSIS — Z7984 Long term (current) use of oral hypoglycemic drugs: Secondary | ICD-10-CM | POA: Diagnosis not present

## 2016-11-29 DIAGNOSIS — I110 Hypertensive heart disease with heart failure: Secondary | ICD-10-CM | POA: Diagnosis present

## 2016-11-29 DIAGNOSIS — I25119 Atherosclerotic heart disease of native coronary artery with unspecified angina pectoris: Secondary | ICD-10-CM | POA: Diagnosis not present

## 2016-11-29 DIAGNOSIS — Z7901 Long term (current) use of anticoagulants: Secondary | ICD-10-CM | POA: Diagnosis not present

## 2016-11-29 DIAGNOSIS — I959 Hypotension, unspecified: Secondary | ICD-10-CM | POA: Diagnosis not present

## 2016-11-29 DIAGNOSIS — R0602 Shortness of breath: Secondary | ICD-10-CM | POA: Diagnosis not present

## 2016-11-29 DIAGNOSIS — I2510 Atherosclerotic heart disease of native coronary artery without angina pectoris: Secondary | ICD-10-CM | POA: Diagnosis present

## 2016-11-29 DIAGNOSIS — M479 Spondylosis, unspecified: Secondary | ICD-10-CM | POA: Diagnosis present

## 2016-11-29 DIAGNOSIS — R079 Chest pain, unspecified: Secondary | ICD-10-CM | POA: Diagnosis not present

## 2016-11-29 DIAGNOSIS — E669 Obesity, unspecified: Secondary | ICD-10-CM | POA: Diagnosis present

## 2016-11-29 DIAGNOSIS — I5033 Acute on chronic diastolic (congestive) heart failure: Secondary | ICD-10-CM | POA: Diagnosis present

## 2016-11-29 DIAGNOSIS — I25798 Atherosclerosis of other coronary artery bypass graft(s) with other forms of angina pectoris: Secondary | ICD-10-CM | POA: Diagnosis not present

## 2016-11-29 DIAGNOSIS — L900 Lichen sclerosus et atrophicus: Secondary | ICD-10-CM | POA: Diagnosis present

## 2016-11-29 DIAGNOSIS — J449 Chronic obstructive pulmonary disease, unspecified: Secondary | ICD-10-CM | POA: Diagnosis present

## 2016-11-29 DIAGNOSIS — Z955 Presence of coronary angioplasty implant and graft: Secondary | ICD-10-CM | POA: Diagnosis not present

## 2016-11-29 DIAGNOSIS — I081 Rheumatic disorders of both mitral and tricuspid valves: Secondary | ICD-10-CM | POA: Diagnosis present

## 2016-11-29 DIAGNOSIS — I10 Essential (primary) hypertension: Secondary | ICD-10-CM | POA: Diagnosis not present

## 2016-11-29 DIAGNOSIS — E785 Hyperlipidemia, unspecified: Secondary | ICD-10-CM | POA: Diagnosis present

## 2016-11-29 DIAGNOSIS — M419 Scoliosis, unspecified: Secondary | ICD-10-CM | POA: Diagnosis present

## 2016-11-29 DIAGNOSIS — E119 Type 2 diabetes mellitus without complications: Secondary | ICD-10-CM | POA: Diagnosis present

## 2016-11-29 DIAGNOSIS — Z87891 Personal history of nicotine dependence: Secondary | ICD-10-CM | POA: Diagnosis not present

## 2016-11-29 LAB — BASIC METABOLIC PANEL
ANION GAP: 9 (ref 5–15)
BUN: 14 mg/dL (ref 6–20)
CALCIUM: 8.9 mg/dL (ref 8.9–10.3)
CO2: 25 mmol/L (ref 22–32)
Chloride: 103 mmol/L (ref 101–111)
Creatinine, Ser: 0.95 mg/dL (ref 0.44–1.00)
GFR, EST NON AFRICAN AMERICAN: 55 mL/min — AB (ref >60)
Glucose, Bld: 101 mg/dL — ABNORMAL HIGH (ref 65–99)
Potassium: 4.4 mmol/L (ref 3.5–5.1)
Sodium: 137 mmol/L (ref 135–145)

## 2016-11-29 LAB — PROTIME-INR
INR: 3.47
Prothrombin Time: 35.7 seconds — ABNORMAL HIGH (ref 11.4–15.2)

## 2016-11-29 LAB — ECHOCARDIOGRAM COMPLETE
Height: 68.5 in
Weight: 3366.4 oz

## 2016-11-29 LAB — GLUCOSE, CAPILLARY
GLUCOSE-CAPILLARY: 133 mg/dL — AB (ref 65–99)
GLUCOSE-CAPILLARY: 92 mg/dL (ref 65–99)
Glucose-Capillary: 95 mg/dL (ref 65–99)
Glucose-Capillary: 108 mg/dL — ABNORMAL HIGH (ref 65–99)

## 2016-11-29 LAB — TROPONIN I
TROPONIN I: 0.04 ng/mL — AB (ref <0.03)
TROPONIN I: 0.04 ng/mL — AB (ref <0.03)
TROPONIN I: 0.03 ng/mL — AB (ref <0.03)

## 2016-11-29 NOTE — Progress Notes (Signed)
Patient Name: [name] Date of Encounter: 11/29/2016  Primary Cardiologist: Halia Franey [name] MD  Hospital Problem List     Principal Problem:   Acute on chronic diastolic CHF (congestive heart failure) ([place]) Active Problems:   HTN (hypertension)   Coronary artery disease   Atrial fibrillation ([place])   Long term current use of anticoagulant therapy     Subjective   Feeling better, less SOB. Good diuresis.  Inpatient Medications    Scheduled Meds: . furosemide  40 mg Intravenous BID  . insulin aspart  0-15 Units Subcutaneous TID WC  . losartan  50 mg Oral Daily  . metoprolol succinate  25 mg Oral Daily  . potassium chloride  10 mEq Oral Daily  . sodium chloride flush  3 mL Intravenous Q12H  . Warfarin - Pharmacist Dosing Inpatient   Does not apply q1800   Continuous Infusions:  PRN Meds: sodium chloride, acetaminophen, ALPRAZolam, ondansetron (ZOFRAN) IV, sodium chloride flush   Vital Signs    Vitals:   11/28/16 2132 11/29/16 0542 11/29/16 0925 11/29/16 1012  BP: (!) 142/87 138/67 (!) 109/55 (!) 99/55  Pulse: 76 70 62 66  Resp: 18 20 17  (!) 28  Temp: 98.6 F (37 C) 97.9 F (36.6 C) 98.4 F (36.9 C) 97.7 F (36.5 C)  TempSrc: Oral Oral Oral Oral  SpO2: 94% 96% 94% 95%  Weight: 210 lb 6.4 oz (95.4 kg) 210 lb 6.4 oz (95.4 kg)    Height: 5' 8.5" (1.74 m)       Intake/Output Summary (Last 24 hours) at 11/29/16 1146 Last data filed at 11/29/16 1128  Gross per 24 hour  Intake              600 ml  Output             1625 ml  Net            -1025 ml   Filed Weights   11/28/16 2132 11/29/16 0542  Weight: 210 lb 6.4 oz (95.4 kg) 210 lb 6.4 oz (95.4 kg)    Physical Exam    GEN: Well nourished, well developed, in no acute distress.  HEENT: Grossly normal.  Neck: Supple, no JVD, carotid bruits, or masses. Cardiac: IRRR, no murmurs, rubs, or gallops. 2+edema.  Radials/DP/PT 2+ and equal bilaterally.  Respiratory:  Respirations regular and unlabored, clear  to auscultation bilaterally. GI: Soft, nontender, nondistended, BS + x 4. MS: no deformity or atrophy. Skin: warm and dry, no rash. Neuro:  Strength and sensation are intact. Psych: AAOx3.  Normal affect.  Labs    CBC  Recent Labs  11/28/16 1440  WBC 7.7  NEUTROABS 6.3  HGB 12.1  HCT 39.1  MCV 78.7  PLT [id number]   Basic Metabolic Panel  Recent Labs  11/28/16 1440 11/29/16 0533  NA 137 137  K 4.7 4.4  CL 102 103  CO2 26 25  GLUCOSE 92 101*  BUN 13 14  CREATININE 0.85 0.95  CALCIUM 9.3 8.9   Liver Function Tests  Recent Labs  11/28/16 1440  AST 21  ALT 11*  ALKPHOS 88  BILITOT 1.3*  PROT 6.9  ALBUMIN 4.0   No results for input(s): LIPASE, AMYLASE in the last 72 hours. Cardiac Enzymes  Recent Labs  11/28/16 2256 11/29/16 0533  TROPONINI 0.04* 0.04*   BNP Invalid input(s): POCBNP D-Dimer No results for input(s): DDIMER in the last 72 hours. Hemoglobin A1C No results for input(s): HGBA1C in the last  72 hours. Fasting Lipid Panel No results for input(s): CHOL, HDL, LDLCALC, TRIG, CHOLHDL, LDLDIRECT in the last 72 hours. Thyroid Function Tests No results for input(s): TSH, T4TOTAL, T3FREE, THYROIDAB in the last 72 hours.  Invalid input(s): FREET3  Telemetry    Afib with controlled rate - Personally Reviewed  ECG    Afib, incomplete RBBB. Low voltage. - Personally Reviewed  Radiology    Dg Chest 2 View  Result Date: 11/28/2016 CLINICAL DATA:  Two days of shortness of breath. No chest pain. History of coronary angioplasty and stent placement in 2015. CABG in 2007. EXAM: CHEST  2 VIEW COMPARISON:  CT scan of the chest of January 02, 2014 and chest x-ray of the same date FINDINGS: The lungs are mildly hyperinflated. There small bilateral pleural effusions greatest on the right. The interstitial markings are increased. The cardiac silhouette is enlarged and the pulmonary vascularity is engorged. The sternal wires are intact. There is calcification in  the wall of the aortic arch. The observed bony thorax exhibits no acute abnormality. IMPRESSION: CHF with pulmonary interstitial edema and small bilateral pleural effusions. Probable underlying COPD. Electronically Signed   By: David  [name] M.D.   On: 11/28/2016 15:40    Cardiac Studies   Echo: pending  Patient Profile     [name] is a 82 y.o. female with past medical history of CAD (s/p single vessel CABG in 2007 with SVG-PDA secondary to dissection of attempted RCA angioplasty, DES to LCx in 2012, instent restenosis by cath in 12/2013 w/ DES placement at that time), chronic atrial fibrillation (on Coumadin), carotid artery disease (s/p R CEA with recurrent stenosis and subsequent stenting in 07/2015), HTN, HLD, chronic diastolic CHF, and iron deficiency anemia who presents to [facility] ED on 11/28/2016 for worsening dyspnea and chest discomfort.   Assessment & Plan    1. Acute on Chronic Diastolic CHF - was diagnosed with CHF exacerbation by PCP weeks ago and instructed to take PO Lasix 20mg  every other day, but she has not done this due to frequent urination.  - has developed progressive dyspnea with exertion and lower extremity edema.  - appears her baseline weight has been 206 lbs in the past, however she is volume overloaded on exam. Therefore we will need to establish a new dry weight.  - BNP elevated to 1268 and CXR shows CHF with pulmonary interstitial edema and small bilateral pleural effusions with probable underlying COPD. She does appear volume overloaded on exam with elevated JVD and lower extremity edema. - diuresing well on  IV Lasix 40mg  BID. Repeat echocardiogram done- results pending.  Obtain daily weights and strict I&O's.  -- will hold losartan today due to low BP.  2. Chest Pain/ Known CAD - s/p single vessel CABG in 2007 with SVG-PDA secondary to dissection of attempted RCA angioplasty, DES to LCx in 2012, instent restenosis by cath in 12/2013 w/ DES placement  at that time. Cath in 2015 showed 40% distal LM stenosis, 50% mid-LAD stenosis, 50% proxCx stenosis with 90% mid-Cx stenosis (DES placed), known CTO of RCA and patent SVG-PDA. - reports episodes of dyspnea with exertion for 3-4 days and episodic chest pressure starting yesterday afternoon. Only two episodes thus far, one occurring at rest and the other with exertion. Says this does not resemble her previous episodes of unstable angina . - troponin 0.04 with flat trend. EKG shows rate-controlled atrial fibrillation, HR 88, with no acute ST or T-wave changes when compared to previous  tracings.  -  Do not plan further ischemic evaluation at this time.  3. Chronic Atrial Fibrillation - This patients CHA2DS2-VASc Score and unadjusted Ischemic Stroke Rate (% per year) is equal to 11.2 % stroke rate/year from a score of 7 (CHF, HTN, DM, Vascular, Female, Age (2)). On Coumadin prior to admission. INR high today. Will hold Coumadin on admission as she is mildly supra-therapeutic.  - continue Toprol-XL for rate-control.   4. HTN - BP low today will hold losartan.  5. Type 2 DM - on Metformin PTA - SSI while admitted.   Signed, Alaysia Lightle [name], MD  11/29/2016, 11:46 AM

## 2016-11-29 NOTE — Telephone Encounter (Signed)
[name] ( daughter) is calling wanting to speak to Dr.Jordan about her mother . Please call at [phone]

## 2016-11-29 NOTE — Telephone Encounter (Signed)
Pt current admission after recommendation for ER evaluation yesterday, Dr. [name] has seen in hospital. Daughter requested call.

## 2016-11-29 NOTE — Progress Notes (Signed)
Patient is alert and oriented Blood pressure has improved, Expecting to be discharged tomorrow.

## 2016-11-29 NOTE — Care Management Note (Signed)
Case Management Note  Patient Details  Name: MERLIE MOUSE MRN: [id number] Date of Birth: 12/20/1933  Subjective/Objective:     Admitted to Observation with CHF               Action/Plan: Patient lives at [facility].  Primary Physician: [name], MD; patient has private insurance with Medicare / [place] with prescription drug coverage; CM will continue to follow for DCP, awaiting on PT/ OT eval for disposition needs;  Expected Discharge Date:     Possible 11/30/2016             Expected Discharge Plan:  [place]  Discharge planning Services  CM Consult  Status of Service:  In process, will continue to follow  Sherrilyn Rist [id number] 11/29/2016, 10:48 AM
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
## 2016-11-29 NOTE — Progress Notes (Signed)
  Echocardiogram 2D Echocardiogram has been performed.  [name] 11/29/2016, 8:49 AM

## 2016-11-29 NOTE — Progress Notes (Signed)
Patient troponin is elevated and non symptomatic.

## 2016-11-29 NOTE — Clinical Social Work Note (Signed)
CSW acknowledges consult that patient is from [place]. RNCM notified.  Dayton Scrape, [place]

## 2016-11-30 LAB — BASIC METABOLIC PANEL
ANION GAP: 12 (ref 5–15)
BUN: 21 mg/dL — ABNORMAL HIGH (ref 6–20)
CHLORIDE: 98 mmol/L — AB (ref 101–111)
CO2: 27 mmol/L (ref 22–32)
CREATININE: 1.12 mg/dL — AB (ref 0.44–1.00)
Calcium: 8.9 mg/dL (ref 8.9–10.3)
GFR calc non Af Amer: 45 mL/min — ABNORMAL LOW (ref >60)
GFR, EST AFRICAN AMERICAN: 52 mL/min — AB (ref >60)
Glucose, Bld: 104 mg/dL — ABNORMAL HIGH (ref 65–99)
Potassium: 4.6 mmol/L (ref 3.5–5.1)
SODIUM: 137 mmol/L (ref 135–145)

## 2016-11-30 LAB — GLUCOSE, CAPILLARY
GLUCOSE-CAPILLARY: 104 mg/dL — AB (ref 65–99)
GLUCOSE-CAPILLARY: 93 mg/dL (ref 65–99)
GLUCOSE-CAPILLARY: 121 mg/dL — AB (ref 65–99)
Glucose-Capillary: 99 mg/dL (ref 65–99)

## 2016-11-30 LAB — PROTIME-INR
INR: 2.76
PROTHROMBIN TIME: 29.8 s — AB (ref 11.4–15.2)

## 2016-11-30 MED ORDER — METOLAZONE 2.5 MG PO TABS
2.5000 mg | ORAL_TABLET | Freq: Once | ORAL | Status: AC
  Administered 2016-11-30: 2.5 mg via ORAL
  Filled 2016-11-30: qty 1

## 2016-11-30 MED ORDER — LOSARTAN POTASSIUM 25 MG PO TABS
25.0000 mg | ORAL_TABLET | Freq: Every day | ORAL | Status: DC
  Administered 2016-12-01 – 2016-12-02 (×2): 25 mg via ORAL
  Filled 2016-11-30 (×3): qty 1

## 2016-11-30 MED ORDER — WARFARIN SODIUM 7.5 MG PO TABS
7.5000 mg | ORAL_TABLET | Freq: Once | ORAL | Status: AC
  Administered 2016-11-30: 7.5 mg via ORAL
  Filled 2016-11-30: qty 1

## 2016-11-30 NOTE — Progress Notes (Signed)
Nutrition Education Note  RD consulted for nutrition education.  RD provided "Low Sodium Nutrition Therapy" handout from the Academy of [employer] and [employer]etics. Reviewed patient's dietary recall. Provided examples on ways to decrease sodium intake in diet. Discouraged intake of processed foods and use of salt shaker. Encouraged fresh fruits and vegetables as well as whole grain sources of carbohydrates to maximize fiber intake. Discussed tips for eating out and encouraged patient to meet with dietitian at [facility] to obtain nutrition information.   RD discussed why it is important for patient to adhere to diet recommendations, and emphasized the role of fluids, foods to avoid, and importance of weighing self daily. Teach back method used.  Expect Very good compliance. Pt expresses that she can make a few changes: stop eating soup, eat less Chik-fi-A, eat more fresh fruit, and choose lower sodium cheeses.   Body mass index is 31.33 kg/m. Pt meets criteria for Obesity based on current BMI.  Current diet order is Heart Healthy, patient is consuming approximately 75-100% of meals at this time. Labs and medications reviewed. No further nutrition interventions warranted at this time. RD contact information provided. If additional nutrition issues arise, please re-consult RD.   Scarlette Ar RD, CSP, LDN Inpatient Clinical Dietitian Pager: [phone] After Hours Pager: [phone]

## 2016-11-30 NOTE — Progress Notes (Signed)
ANTICOAGULATION CONSULT NOTE - Follow Up Consult  Pharmacy Consult for Coumadin Indication: atrial fibrillation  Allergies  Allergen Reactions  . Ace Inhibitors Other (See Comments)    Intolerance per Dr. Doug Sou note  . Amlodipine Other (See Comments)    Intolerance per Dr. Doug Sou note   . Statins Other (See Comments)    Muscle soreness  . Sulfa Drugs Cross Reactors Itching  . Zetia [Ezetimibe] Other (See Comments)    Muscle soreness  . Fenofibrate Other (See Comments)    Muscle soreness    Patient Measurements: Height: 5' 8.5" (174 cm) Weight: 209 lb 1.6 oz (94.8 kg) (scale a) IBW/kg (Calculated) : 65.05  Vital Signs: Temp: 98.2 F (36.8 C) (12/01 0451) Temp Source: Oral (12/01 0451) BP: 120/89 (12/01 0451) Pulse Rate: 64 (12/01 0451)  Labs:  Recent Labs  11/28/16 1440 11/28/16 1449 11/28/16 2256 11/29/16 0533 11/29/16 1134 11/30/16 0320  HGB 12.1  --   --   --   --   --   HCT 39.1  --   --   --   --   --   PLT 247  --   --   --   --   --   LABPROT  --  32.5*  --  35.7*  --  29.8*  INR  --  3.08  --  3.47  --  2.76  CREATININE 0.85  --   --  0.95  --  1.12*  TROPONINI  --   --  0.04* 0.04* 0.03*  --     Estimated Creatinine Clearance: 47.9 mL/min (by C-G formula based on SCr of 1.12 mg/dL (H)).  Assessment: 83yof on coumadin pta for afib. INR 3 on admit and trended up to 3.4 yesterday despite last dose taken 11/28. Dose held last night. INR down and back within therapeutic range to 2.75. No bleeding reported.  Home dose: 5mg  daily except 7.5mg  on Thursdays  Goal of Therapy:  INR 2-3 Monitor platelets by anticoagulation protocol: Yes   Plan:  1) Coumadin 7.5mg  x 1 2) Daily INR  [name] 11/30/2016,8:40 AM

## 2016-11-30 NOTE — Progress Notes (Signed)
Patient Name: [name] Date of Encounter: 11/30/2016  Primary Cardiologist: Peter [name] MD  Hospital Problem List     Principal Problem:   Acute on chronic diastolic CHF (congestive heart failure) ([place]) Active Problems:   HTN (hypertension)   Coronary artery disease   Atrial fibrillation ([place])   Long term current use of anticoagulant therapy     Subjective   Feeling better, still SOB with exertion.   Inpatient Medications    Scheduled Meds: . furosemide  40 mg Intravenous BID  . insulin aspart  0-15 Units Subcutaneous TID WC  . losartan  50 mg Oral Daily  . metoprolol succinate  25 mg Oral Daily  . potassium chloride  10 mEq Oral Daily  . sodium chloride flush  3 mL Intravenous Q12H  . warfarin  7.5 mg Oral ONCE-1800  . Warfarin - Pharmacist Dosing Inpatient   Does not apply q1800   Continuous Infusions:  PRN Meds: sodium chloride, acetaminophen, ALPRAZolam, ondansetron (ZOFRAN) IV, sodium chloride flush   Vital Signs    Vitals:   11/29/16 1656 11/29/16 2013 11/30/16 0010 11/30/16 0451  BP: 113/69 (!) 113/56 119/72 120/89  Pulse: (!) 54 (!) 54 63 64  Resp: 20 18 18 18   Temp: 98.5 F (36.9 C)  98.5 F (36.9 C) 98.2 F (36.8 C)  TempSrc: Oral  Oral Oral  SpO2: 98% 97% 97% 94%  Weight:    209 lb 1.6 oz (94.8 kg)  Height:        Intake/Output Summary (Last 24 hours) at 11/30/16 0959 Last data filed at 11/30/16 0953  Gross per 24 hour  Intake              600 ml  Output             1900 ml  Net            -1300 ml   Filed Weights   11/28/16 2132 11/29/16 0542 11/30/16 0451  Weight: 210 lb 6.4 oz (95.4 kg) 210 lb 6.4 oz (95.4 kg) 209 lb 1.6 oz (94.8 kg)    Physical Exam    GEN: Well nourished, well developed, in no acute distress.  HEENT: Grossly normal.  Neck: Supple, no JVD, carotid bruits, or masses. Cardiac: IRRR, no murmurs, rubs, or gallops. 1- 2+edema.  Radials/DP/PT 2+ and equal bilaterally.  Respiratory:  Respirations regular and  unlabored, clear to auscultation bilaterally. GI: Soft, nontender, nondistended, BS + x 4. MS: no deformity or atrophy. Skin: warm and dry, no rash. Neuro:  Strength and sensation are intact. Psych: AAOx3.  Normal affect.  Labs    CBC  Recent Labs  11/28/16 1440  WBC 7.7  NEUTROABS 6.3  HGB 12.1  HCT 39.1  MCV 78.7  PLT A999333   Basic Metabolic Panel  Recent Labs  11/29/16 0533 11/30/16 0320  NA 137 137  K 4.4 4.6  CL 103 98*  CO2 25 27  GLUCOSE 101* 104*  BUN 14 21*  CREATININE 0.95 1.12*  CALCIUM 8.9 8.9   Liver Function Tests  Recent Labs  11/28/16 1440  AST 21  ALT 11*  ALKPHOS 88  BILITOT 1.3*  PROT 6.9  ALBUMIN 4.0   No results for input(s): LIPASE, AMYLASE in the last 72 hours. Cardiac Enzymes  Recent Labs  11/28/16 2256 11/29/16 0533 11/29/16 1134  TROPONINI 0.04* 0.04* 0.03*   BNP Invalid input(s): POCBNP D-Dimer No results for input(s): DDIMER in the last 72 hours. Hemoglobin  A1C No results for input(s): HGBA1C in the last 72 hours. Fasting Lipid Panel No results for input(s): CHOL, HDL, LDLCALC, TRIG, CHOLHDL, LDLDIRECT in the last 72 hours. Thyroid Function Tests No results for input(s): TSH, T4TOTAL, T3FREE, THYROIDAB in the last 72 hours.  Invalid input(s): FREET3  Telemetry    Afib with controlled rate - Personally Reviewed  ECG    Afib, incomplete RBBB. Low voltage. - Personally Reviewed  Radiology    Dg Chest 2 View  Result Date: 11/28/2016 CLINICAL DATA:  Two days of shortness of breath. No chest pain. History of coronary angioplasty and stent placement in 2015. CABG in 2007. EXAM: CHEST  2 VIEW COMPARISON:  CT scan of the chest of January 02, 2014 and chest x-ray of the same date FINDINGS: The lungs are mildly hyperinflated. There small bilateral pleural effusions greatest on the right. The interstitial markings are increased. The cardiac silhouette is enlarged and the pulmonary vascularity is engorged. The sternal  wires are intact. There is calcification in the wall of the aortic arch. The observed bony thorax exhibits no acute abnormality. IMPRESSION: CHF with pulmonary interstitial edema and small bilateral pleural effusions. Probable underlying COPD. Electronically Signed   By: David  [name] M.D.   On: 11/28/2016 15:40    Cardiac Studies   Echo: Left ventricle:  The cavity size was normal. There was severe concentric hypertrophy. Systolic function was vigorous. The estimated ejection fraction was in the range of 70-75%. Wall motion was normal; there were no regional wall motion abnormalities. The study was not technically sufficient to allow evaluation of LV diastolic dysfunction due to atrial fibrillation. Doppler parameters are consistent with elevated ventricular end-diastolic filling pressure.  Patient Profile     [name] is a 82 y.o. female with past medical history of CAD (s/p single vessel CABG in 2007 with SVG-PDA secondary to dissection of attempted RCA angioplasty, DES to LCx in 2012, instent restenosis by cath in 12/2013 w/ DES placement at that time), chronic atrial fibrillation (on Coumadin), carotid artery disease (s/p R CEA with recurrent stenosis and subsequent stenting in 07/2015), HTN, HLD, chronic diastolic CHF, and iron deficiency anemia who presents to [facility] ED on 11/28/2016 for worsening dyspnea and chest discomfort.   Assessment & Plan    1. Acute on Chronic Diastolic CHF - was diagnosed with CHF exacerbation by PCP weeks ago and instructed to take PO Lasix 20mg  every other day, but she has not done this due to frequent urination.  - has developed progressive dyspnea with exertion and lower extremity edema.  - appears her baseline weight has been 206 lbs in the past, however she is volume overloaded on exam. Therefore we will need to establish a new dry weight.  - BNP elevated to 1268 and CXR shows CHF with pulmonary interstitial edema and small bilateral pleural  effusions with probable underlying COPD. She does appear volume overloaded on exam with elevated JVD and lower extremity edema. --Echo shows vigorous LV systolic function with diastolic dysfunction, Mild to mod AS, mild MS - diuresing OK on  IV Lasix 40mg  BID. I/O negative 2200 cc since admit. Weight only down one lb.  -- will resume losartan 25 mg daily. -- add metolazone 2.5 mg x 1 today.  --- BMET in am.   2. Chest Pain/ Known CAD - s/p single vessel CABG in 2007 with SVG-PDA secondary to dissection of attempted RCA angioplasty, DES to LCx in 2012, instent restenosis by cath in 12/2013 w/  DES placement at that time. Cath in 2015 showed 40% distal LM stenosis, 50% mid-LAD stenosis, 50% proxCx stenosis with 90% mid-Cx stenosis (DES placed), known CTO of RCA and patent SVG-PDA. - troponin 0.04 with flat trend. EKG shows rate-controlled atrial fibrillation, HR 88, with no acute ST or T-wave changes when compared to previous tracings.  -  Do not plan further ischemic evaluation at this time.  3. Chronic Atrial Fibrillation - This patients CHA2DS2-VASc Score and unadjusted Ischemic Stroke Rate (% per year) is equal to 11.2 % stroke rate/year from a score of 7 (CHF, HTN, DM, Vascular, Female, Age (2)). On Coumadin prior to admission. INR high today.  Coumadin dosing per pharmacy - continue Toprol-XL for rate-control.   4. HTN - BP controlled.  5. Type 2 DM - on Metformin PTA - SSI while admitted.   I would anticipate DC this weekend on oral lasix.   Signed, Peter [name], MD  11/30/2016, 9:59 AM

## 2016-12-01 DIAGNOSIS — I25798 Atherosclerosis of other coronary artery bypass graft(s) with other forms of angina pectoris: Secondary | ICD-10-CM

## 2016-12-01 DIAGNOSIS — R079 Chest pain, unspecified: Secondary | ICD-10-CM

## 2016-12-01 LAB — BASIC METABOLIC PANEL
ANION GAP: 12 (ref 5–15)
BUN: 26 mg/dL — ABNORMAL HIGH (ref 6–20)
CALCIUM: 8.8 mg/dL — AB (ref 8.9–10.3)
CO2: 25 mmol/L (ref 22–32)
Chloride: 98 mmol/L — ABNORMAL LOW (ref 101–111)
Creatinine, Ser: 1.08 mg/dL — ABNORMAL HIGH (ref 0.44–1.00)
GFR, EST AFRICAN AMERICAN: 54 mL/min — AB (ref >60)
GFR, EST NON AFRICAN AMERICAN: 47 mL/min — AB (ref >60)
Glucose, Bld: 107 mg/dL — ABNORMAL HIGH (ref 65–99)
POTASSIUM: 4.1 mmol/L (ref 3.5–5.1)
SODIUM: 135 mmol/L (ref 135–145)

## 2016-12-01 LAB — GLUCOSE, CAPILLARY
GLUCOSE-CAPILLARY: 91 mg/dL (ref 65–99)
GLUCOSE-CAPILLARY: 111 mg/dL — AB (ref 65–99)
Glucose-Capillary: 104 mg/dL — ABNORMAL HIGH (ref 65–99)
Glucose-Capillary: 94 mg/dL (ref 65–99)

## 2016-12-01 LAB — PROTIME-INR
INR: 2.15
PROTHROMBIN TIME: 24.3 s — AB (ref 11.4–15.2)

## 2016-12-01 MED ORDER — WARFARIN SODIUM 7.5 MG PO TABS
7.5000 mg | ORAL_TABLET | Freq: Once | ORAL | Status: AC
  Administered 2016-12-01: 7.5 mg via ORAL
  Filled 2016-12-01: qty 1

## 2016-12-01 MED ORDER — PANTOPRAZOLE SODIUM 20 MG PO TBEC
20.0000 mg | DELAYED_RELEASE_TABLET | Freq: Every day | ORAL | Status: DC
  Administered 2016-12-01 – 2016-12-04 (×4): 20 mg via ORAL
  Filled 2016-12-01 (×4): qty 1

## 2016-12-01 NOTE — Progress Notes (Signed)
ANTICOAGULATION CONSULT NOTE - Follow Up Consult  Pharmacy Consult for Coumadin Indication: atrial fibrillation  Allergies  Allergen Reactions  . Ace Inhibitors Other (See Comments)    Intolerance per Dr. Doug Sou note  . Amlodipine Other (See Comments)    Intolerance per Dr. Doug Sou note   . Statins Other (See Comments)    Muscle soreness  . Sulfa Drugs Cross Reactors Itching  . Zetia [Ezetimibe] Other (See Comments)    Muscle soreness  . Fenofibrate Other (See Comments)    Muscle soreness    Patient Measurements: Height: 5' 8.5" (174 cm) Weight: 204 lb 8 oz (92.8 kg) (scale a) IBW/kg (Calculated) : 65.05  Vital Signs: Temp: 97.4 F (36.3 C) (12/02 0709) Temp Source: Oral (12/02 0709) BP: 125/66 (12/02 1230) Pulse Rate: 63 (12/02 1230)  Labs:  Recent Labs  11/28/16 1440  11/28/16 2256 11/29/16 0533 11/29/16 1134 11/30/16 0320 12/01/16 0248  HGB 12.1  --   --   --   --   --   --   HCT 39.1  --   --   --   --   --   --   PLT 247  --   --   --   --   --   --   LABPROT  --   < >  --  35.7*  --  29.8* 24.3*  INR  --   < >  --  3.47  --  2.76 2.15  CREATININE 0.85  --   --  0.95  --  1.12* 1.08*  TROPONINI  --   --  0.04* 0.04* 0.03*  --   --   < > = values in this interval not displayed.  Estimated Creatinine Clearance: 49.1 mL/min (by C-G formula based on SCr of 1.08 mg/dL (H)).  Assessment: 83yof on coumadin pta for afib. INR 3 on admit and trended up to 3.4 yesterday despite last dose taken 11/28. Dose held 11/29 & 11/30. INR continues to decrease to 2.15 today. No bleeding noted.   Home dose: 5mg  daily except 7.5mg  on Thursdays  Goal of Therapy:  INR 2-3 Monitor platelets by anticoagulation protocol: Yes   Plan:  1) Repeat Coumadin 7.5mg  x 1 to prevent INR from further dropping 2) Daily INR  [name], [name] 12/01/2016,1:23 PM

## 2016-12-01 NOTE — Progress Notes (Signed)
Patient Name: [name] Date of Encounter: 12/01/2016  Primary Cardiologist: [name]  Hospital Problem List     Principal Problem:   Acute on chronic diastolic CHF (congestive heart failure) ([place]) Active Problems:   HTN (hypertension)   Coronary artery disease   Atrial fibrillation ([place])   Long term current use of anticoagulant therapy     Subjective   Says breathing is better. No chest pain. Wants to go home.  Inpatient Medications    Scheduled Meds: . furosemide  40 mg Intravenous BID  . insulin aspart  0-15 Units Subcutaneous TID WC  . losartan  25 mg Oral Daily  . metoprolol succinate  25 mg Oral Daily  . potassium chloride  10 mEq Oral Daily  . sodium chloride flush  3 mL Intravenous Q12H  . Warfarin - Pharmacist Dosing Inpatient   Does not apply q1800   Continuous Infusions:  PRN Meds: sodium chloride, acetaminophen, ALPRAZolam, ondansetron (ZOFRAN) IV, sodium chloride flush   Vital Signs    Vitals:   11/30/16 1806 11/30/16 2029 12/01/16 0709 12/01/16 0826  BP: 106/65 118/79 128/76 120/66  Pulse: 76 66 70 66  Resp:  16 18   Temp:  98.5 F (36.9 C) 97.4 F (36.3 C)   TempSrc:  Oral Oral   SpO2:  97% 94%   Weight:   204 lb 8 oz (92.8 kg)   Height:        Intake/Output Summary (Last 24 hours) at 12/01/16 1025 Last data filed at 12/01/16 0900  Gross per 24 hour  Intake              843 ml  Output             2400 ml  Net            -1557 ml   Filed Weights   11/29/16 0542 11/30/16 0451 12/01/16 0709  Weight: 210 lb 6.4 oz (95.4 kg) 209 lb 1.6 oz (94.8 kg) 204 lb 8 oz (92.8 kg)    Physical Exam    GEN: Well nourished, well developed, in no acute distress.  HEENT: Grossly normal.  Neck: Supple, no JVD,  or masses. Cardiac: Irregular rhythm, normal rate, 2/6 systolic murmur, no rubs or gallops. No clubbing, cyanosis, non-pitting pretibial edema.  Respiratory:  Respirations regular and unlabored, clear to auscultation bilaterally. GI:  Soft, nontender, nondistended MS: no deformity or atrophy. Neuro:  Strength and sensation are intact. Psych: AAOx3.  Normal affect.  Labs    CBC  Recent Labs  11/28/16 1440  WBC 7.7  NEUTROABS 6.3  HGB 12.1  HCT 39.1  MCV 78.7  PLT A999333   Basic Metabolic Panel  Recent Labs  11/30/16 0320 12/01/16 0248  NA 137 135  K 4.6 4.1  CL 98* 98*  CO2 27 25  GLUCOSE 104* 107*  BUN 21* 26*  CREATININE 1.12* 1.08*  CALCIUM 8.9 8.8*   Liver Function Tests  Recent Labs  11/28/16 1440  AST 21  ALT 11*  ALKPHOS 88  BILITOT 1.3*  PROT 6.9  ALBUMIN 4.0   No results for input(s): LIPASE, AMYLASE in the last 72 hours. Cardiac Enzymes  Recent Labs  11/28/16 2256 11/29/16 0533 11/29/16 1134  TROPONINI 0.04* 0.04* 0.03*   BNP Invalid input(s): POCBNP D-Dimer No results for input(s): DDIMER in the last 72 hours. Hemoglobin A1C No results for input(s): HGBA1C in the last 72 hours. Fasting Lipid Panel No results for input(s): CHOL, HDL,  LDLCALC, TRIG, CHOLHDL, LDLDIRECT in the last 72 hours. Thyroid Function Tests No results for input(s): TSH, T4TOTAL, T3FREE, THYROIDAB in the last 72 hours.  Invalid input(s): [place]  Telemetry     Personally Reviewed  ECG    Personally Reviewed  Radiology    No results found.  Cardiac Studies     Patient Profile     [name] a 82 y.o.femalewith past medical history of CAD (s/p single vessel CABG in 2007 with SVG-PDA secondary to dissection of attempted RCA angioplasty, DES to LCx in 2012, instent restenosis by cath in 12/2013 w/ DES placement at that time), chronic atrial fibrillation (on Coumadin), carotid artery disease (s/p R CEA with recurrent stenosis and subsequent stenting in 07/2015), HTN, HLD, chronic diastolic CHF, and iron deficiency anemia who presents to [facility] ED on 11/28/2016 for worsening dyspnea and chest discomfort, admitted with acute on chronic diastolic heart failure.  Assessment &  Plan    1. Acute on Chronic Diastolic CHF -Improving with 1.6 L output in last 24 hrs. Continue IV Lasix for today, plan to switch to oral tomorrow. --Echo shows vigorous LV systolic function with diastolic dysfunction, Mild to mod AS, mild MS -- continue losartan 25 mg daily. --- BMET in am.   2. Chest Pain/ Known CAD - s/p single vessel CABG in 2007 with SVG-PDA secondary to dissection of attempted RCA angioplasty, DES to LCx in 2012, instent restenosis by cath in 12/2013 w/ DES placement at that time. Cath in 2015 showed 40% distal LM stenosis, 50% mid-LAD stenosis, 50% proxCx stenosis with 90% mid-Cx stenosis (DES placed), known CTO of RCA and patent SVG-PDA. - troponin 0.04 with flat trend. EKG shows rate-controlled atrial fibrillation, HR 88, with no acute ST or T-wave changes when compared to previous tracings.  -  Do not plan further ischemic evaluation at this time.  3. Chronic Atrial Fibrillation - This patients CHA2DS2-VASc Score and unadjusted Ischemic Stroke Rate (% per year) is equal to 11.2 % stroke rate/year from a score of 7(CHF, HTN, DM, Vascular, Female, Age (2)). On Coumadin prior to admission. INR therapeutic today.  Coumadin dosing per pharmacy. - continue Toprol-XL for rate-control.   4. HTN - BP controlled.  Signed, Kate Sable, MD  12/01/2016, 10:25 AM

## 2016-12-02 ENCOUNTER — Inpatient Hospital Stay (HOSPITAL_COMMUNITY): Payer: Medicare Other

## 2016-12-02 DIAGNOSIS — I2510 Atherosclerotic heart disease of native coronary artery without angina pectoris: Secondary | ICD-10-CM

## 2016-12-02 DIAGNOSIS — I4891 Unspecified atrial fibrillation: Secondary | ICD-10-CM

## 2016-12-02 LAB — BASIC METABOLIC PANEL
ANION GAP: 13 (ref 5–15)
BUN: 26 mg/dL — ABNORMAL HIGH (ref 6–20)
CALCIUM: 9.5 mg/dL (ref 8.9–10.3)
CHLORIDE: 96 mmol/L — AB (ref 101–111)
CO2: 25 mmol/L (ref 22–32)
Creatinine, Ser: 1.06 mg/dL — ABNORMAL HIGH (ref 0.44–1.00)
GFR calc non Af Amer: 48 mL/min — ABNORMAL LOW (ref >60)
GFR, EST AFRICAN AMERICAN: 56 mL/min — AB (ref >60)
Glucose, Bld: 93 mg/dL (ref 65–99)
POTASSIUM: 4.5 mmol/L (ref 3.5–5.1)
Sodium: 134 mmol/L — ABNORMAL LOW (ref 135–145)

## 2016-12-02 LAB — GLUCOSE, CAPILLARY
GLUCOSE-CAPILLARY: 105 mg/dL — AB (ref 65–99)
GLUCOSE-CAPILLARY: 95 mg/dL (ref 65–99)
GLUCOSE-CAPILLARY: 138 mg/dL — AB (ref 65–99)
GLUCOSE-CAPILLARY: 84 mg/dL (ref 65–99)

## 2016-12-02 LAB — PROTIME-INR
INR: 1.9
Prothrombin Time: 22 seconds — ABNORMAL HIGH (ref 11.4–15.2)

## 2016-12-02 MED ORDER — WARFARIN SODIUM 7.5 MG PO TABS
7.5000 mg | ORAL_TABLET | Freq: Once | ORAL | Status: AC
  Administered 2016-12-02: 7.5 mg via ORAL
  Filled 2016-12-02: qty 1

## 2016-12-02 NOTE — Progress Notes (Signed)
Patient states she is not feeling well, No change in appieite MD aware, ordered soup and ginger ale for supper.

## 2016-12-02 NOTE — Progress Notes (Signed)
Pt has become drowsy and confused, 02 sat dropped to mid 80s. Rapid shallow breaths Hr 100s., Applied 02 at 3 MD notified stat Xray ordered, IV Re- inserted

## 2016-12-02 NOTE — Progress Notes (Signed)
Patient Name: [name] Date of Encounter: 12/02/2016  Primary Cardiologist: Dr. [name]   Hospital Problem List     Principal Problem:   Acute on chronic diastolic CHF (congestive heart failure) ([place]) Active Problems:   HTN (hypertension)   Coronary artery disease   Atrial fibrillation ([place])   Long term current use of anticoagulant therapy    Patient Profile     [name] a 82 y.o.femalewith past medical history of CAD (s/p single vessel CABG in 2007 with SVG-PDA secondary to dissection of attempted RCA angioplasty, DES to LCx in 2012, instent restenosis by cath in 12/2013 w/ DES placement at that time), chronic atrial fibrillation (on Coumadin), carotid artery disease (s/p R CEA with recurrent stenosis and subsequent stenting in 07/2015), HTN, HLD, chronic diastolic CHF, and iron deficiency anemia who presents to [facility] ED on 11/28/2016 for worsening dyspnea and chest discomfort, admitted with acute on chronic diastolic heart failure.   Subjective   Feels better. Breathing improved. She is eager to go home.  Inpatient Medications    Scheduled Meds: . furosemide  40 mg Intravenous BID  . insulin aspart  0-15 Units Subcutaneous TID WC  . losartan  25 mg Oral Daily  . metoprolol succinate  25 mg Oral Daily  . pantoprazole  20 mg Oral Daily  . potassium chloride  10 mEq Oral Daily  . sodium chloride flush  3 mL Intravenous Q12H  . Warfarin - Pharmacist Dosing Inpatient   Does not apply q1800   Continuous Infusions:  PRN Meds: sodium chloride, acetaminophen, ALPRAZolam, ondansetron (ZOFRAN) IV, sodium chloride flush   Vital Signs    Vitals:   12/01/16 2127 12/02/16 0602 12/02/16 0611 12/02/16 0944  BP: (!) 119/55 125/83  120/60  Pulse: 81 61    Resp: 17 18  18   Temp: 99.8 F (37.7 C) 98.5 F (36.9 C)    TempSrc: Oral Oral    SpO2: 97% 97%    Weight:   197 lb 3.2 oz (89.4 kg)   Height:        Intake/Output Summary (Last 24 hours) at  12/02/16 1010 Last data filed at 12/02/16 0824  Gross per 24 hour  Intake              480 ml  Output             2050 ml  Net            -1570 ml   Filed Weights   11/30/16 0451 12/01/16 0709 12/02/16 0611  Weight: 209 lb 1.6 oz (94.8 kg) 204 lb 8 oz (92.8 kg) 197 lb 3.2 oz (89.4 kg)    Physical Exam   GEN: Well nourished, well developed, in no acute distress.  HEENT: Grossly normal.  Neck: Supple, no JVD, carotid bruits, or masses. Cardiac: irregularly irregular rhythm, regular rate, no murmurs, rubs, or gallops. No clubbing or cyanosis, trace- 1+ bilateral LEE radials/DP/PT 2+ and equal bilaterally.  Respiratory:  Respirations regular and unlabored, clear to auscultation bilaterally. GI: Soft, nontender, nondistended, BS + x 4. MS: no deformity or atrophy. Skin: warm and dry, no rash. Neuro:  Strength and sensation are intact. Psych: AAOx3.  Normal affect.  Labs    CBC No results for input(s): WBC, NEUTROABS, HGB, HCT, MCV, PLT in the last 72 hours. Basic Metabolic Panel  Recent Labs  12/01/16 0248 12/02/16 0409  NA 135 134*  K 4.1 4.5  CL 98* 96*  CO2  25 25  GLUCOSE 107* 93  BUN 26* 26*  CREATININE 1.08* 1.06*  CALCIUM 8.8* 9.5   Liver Function Tests No results for input(s): AST, ALT, ALKPHOS, BILITOT, PROT, ALBUMIN in the last 72 hours. No results for input(s): LIPASE, AMYLASE in the last 72 hours. Cardiac Enzymes  Recent Labs  11/29/16 1134  TROPONINI 0.03*   BNP Invalid input(s): POCBNP D-Dimer No results for input(s): DDIMER in the last 72 hours. Hemoglobin A1C No results for input(s): HGBA1C in the last 72 hours. Fasting Lipid Panel No results for input(s): CHOL, HDL, LDLCALC, TRIG, CHOLHDL, LDLDIRECT in the last 72 hours. Thyroid Function Tests No results for input(s): TSH, T4TOTAL, T3FREE, THYROIDAB in the last 72 hours.  Invalid input(s): FREET3  Telemetry    Atrial fibrillation with a CVR- Personally Reviewed   Radiology    No  results found.   Patient Profile     Alexandria Vigh Brownis a 82 y.o.femalewith past medical history of CAD (s/p single vessel CABG in 2007 with SVG-PDA secondary to dissection of attempted RCA angioplasty, DES to LCx in 2012, instent restenosis by cath in 12/2013 w/ DES placement at that time), chronic atrial fibrillation (on Coumadin), carotid artery disease (s/p R CEA with recurrent stenosis and subsequent stenting in 07/2015), HTN, HLD, chronic diastolic CHF, and iron deficiency anemia who presents to [facility] ED on 11/28/2016 for worsening dyspnea and chest discomfort, admitted with acute on chronic diastolic heart failure.  Assessment & Plan    1. Acute on Chronic Diastolic CHF -Improving with an additional 2 L output in last 24 hrs. She still has trace- 1+ bilateral LEE but LCTA, breathing improved--Echo shows vigorous LV systolic function with diastolic dysfunction, Mild to mod AS, mild MS -- continue losartan 25 mg daily. -- switch to PO lasix today --- BMET today shows stable renal function with SCr at 1.06. K is WNL at 4.5. Na is slightly low at 134. -- we discussed importance of low sodium diet, strict adherence to daily lasix + daily weights at home to prevent repeat hospitalizations.   2. Chest Pain/ Known CAD - s/p single vessel CABG in 2007 with SVG-PDA secondary to dissection of attempted RCA angioplasty, DES to LCx in 2012, instent restenosis by cath in 12/2013 w/ DES placement at that time. Cath in 2015 showed 40% distal LM stenosis, 50% mid-LAD stenosis, 50% proxCx stenosis with 90% mid-Cx stenosis (DES placed), known CTO of RCA and patent SVG-PDA. - troponin 0.04 with flat trend. EKG shows rate-controlled atrial fibrillation, HR in the 60s, with no acute ST or T-wave changes when compared to previous tracings.  - Do not plan further ischemic evaluation at this time.  3. Chronic Atrial Fibrillation - This patients CHA2DS2-VASc Score and unadjusted Ischemic Stroke Rate (%  per year) is equal to 11.2 % stroke rate/year from a score of 7(CHF, HTN, DM, Vascular, Female, Age (2)). On Coumadin prior to admission. INR therapeutic today. Coumadin dosing per pharmacy. - continue Toprol-XL for rate-control. Rate is controlled in the 60s on telemetry.   4. HTN - BP controlled.120/60 this morning.   Signed, Lyda Jester, PA-C  12/02/2016, 10:10 AM   Pt seen and examined, and I agree with the assessment and plan. Importance of diuretic adherence was emphasized. She should likely be ready for discharge tomorrow.

## 2016-12-02 NOTE — Progress Notes (Signed)
ANTICOAGULATION CONSULT NOTE - Follow Up Consult  Pharmacy Consult for Coumadin Indication: atrial fibrillation  Allergies  Allergen Reactions  . Ace Inhibitors Other (See Comments)    Intolerance per Dr. Doug Sou note  . Amlodipine Other (See Comments)    Intolerance per Dr. Doug Sou note   . Statins Other (See Comments)    Muscle soreness  . Sulfa Drugs Cross Reactors Itching  . Zetia [Ezetimibe] Other (See Comments)    Muscle soreness  . Fenofibrate Other (See Comments)    Muscle soreness    Patient Measurements: Height: 5' 8.5" (174 cm) Weight: 197 lb 3.2 oz (89.4 kg) (a scale) IBW/kg (Calculated) : 65.05  Vital Signs: Temp: 98.5 F (36.9 C) (12/03 0602) Temp Source: Oral (12/03 0602) BP: 120/60 (12/03 0944) Pulse Rate: 61 (12/03 0602)  Labs:  Recent Labs  11/30/16 0320 12/01/16 0248 12/02/16 0409  LABPROT 29.8* 24.3* 22.0*  INR 2.76 2.15 1.90  CREATININE 1.12* 1.08* 1.06*    Estimated Creatinine Clearance: 49.2 mL/min (by C-G formula based on SCr of 1.06 mg/dL (H)).  Assessment: 83yof on coumadin pta for afib. INR 3 on admit. Doses held 11/29 & 11/30. INR continues to decrease due to held doses and now INR is slightly subtherapeutic. No bleeding noted.   Home dose: 5mg  daily except 7.5mg  on Thursdays  Goal of Therapy:  INR 2-3 Monitor platelets by anticoagulation protocol: Yes   Plan:  1) Repeat Coumadin 7.5mg  x 1 tonight 2) Daily INR  [name], [name] 12/02/2016,12:14 PM

## 2016-12-03 LAB — CBC WITH DIFFERENTIAL/PLATELET
Basophils Absolute: 0 10*3/uL (ref 0.0–0.1)
Basophils Relative: 0 %
EOS PCT: 0 %
Eosinophils Absolute: 0 10*3/uL (ref 0.0–0.7)
HEMATOCRIT: 39.5 % (ref 36.0–46.0)
Hemoglobin: 12.4 g/dL (ref 12.0–15.0)
LYMPHS ABS: 0.4 10*3/uL — AB (ref 0.7–4.0)
LYMPHS PCT: 5 %
MCH: 24.8 pg — AB (ref 26.0–34.0)
MCHC: 31.4 g/dL (ref 30.0–36.0)
MCV: 79.2 fL (ref 78.0–100.0)
MONO ABS: 0.9 10*3/uL (ref 0.1–1.0)
Monocytes Relative: 12 %
NEUTROS ABS: 6.1 10*3/uL (ref 1.7–7.7)
Neutrophils Relative %: 83 %
PLATELETS: 181 10*3/uL (ref 150–400)
RBC: 4.99 MIL/uL (ref 3.87–5.11)
RDW: 18.1 % — AB (ref 11.5–15.5)
WBC: 7.5 10*3/uL (ref 4.0–10.5)

## 2016-12-03 LAB — URINE MICROSCOPIC-ADD ON

## 2016-12-03 LAB — BASIC METABOLIC PANEL WITH GFR
Anion gap: 34 — ABNORMAL HIGH (ref 5–15)
BUN: 27 mg/dL — ABNORMAL HIGH (ref 6–20)
CO2: 23 mmol/L (ref 22–32)
Calcium: 7.1 mg/dL — ABNORMAL LOW (ref 8.9–10.3)
Chloride: 85 mmol/L — ABNORMAL LOW (ref 101–111)
Creatinine, Ser: 1.21 mg/dL — ABNORMAL HIGH (ref 0.44–1.00)
GFR calc Af Amer: 47 mL/min — ABNORMAL LOW
GFR calc non Af Amer: 41 mL/min — ABNORMAL LOW
Glucose, Bld: 75 mg/dL (ref 65–99)
Potassium: 3.9 mmol/L (ref 3.5–5.1)
Sodium: 142 mmol/L (ref 135–145)

## 2016-12-03 LAB — GLUCOSE, CAPILLARY
Glucose-Capillary: 109 mg/dL — ABNORMAL HIGH (ref 65–99)
Glucose-Capillary: 91 mg/dL (ref 65–99)
Glucose-Capillary: 142 mg/dL — ABNORMAL HIGH (ref 65–99)

## 2016-12-03 LAB — PROTIME-INR
INR: 2.08
Prothrombin Time: 23.7 s — ABNORMAL HIGH (ref 11.4–15.2)

## 2016-12-03 LAB — URINALYSIS, ROUTINE W REFLEX MICROSCOPIC
Glucose, UA: NEGATIVE mg/dL
Hgb urine dipstick: NEGATIVE
KETONES UR: NEGATIVE mg/dL
NITRITE: NEGATIVE
PROTEIN: 30 mg/dL — AB
Specific Gravity, Urine: 1.015 (ref 1.005–1.030)
pH: 5 (ref 5.0–8.0)

## 2016-12-03 MED ORDER — SODIUM CHLORIDE 0.9 % IV SOLN
Freq: Once | INTRAVENOUS | Status: AC
  Administered 2016-12-03: 17:00:00 via INTRAVENOUS

## 2016-12-03 MED ORDER — BISACODYL 10 MG RE SUPP
10.0000 mg | Freq: Every day | RECTAL | Status: DC | PRN
  Administered 2016-12-03: 10 mg via RECTAL
  Filled 2016-12-03: qty 1

## 2016-12-03 MED ORDER — CIPROFLOXACIN IN D5W 400 MG/200ML IV SOLN
400.0000 mg | Freq: Two times a day (BID) | INTRAVENOUS | Status: DC
  Administered 2016-12-03 – 2016-12-04 (×2): 400 mg via INTRAVENOUS
  Filled 2016-12-03 (×2): qty 200

## 2016-12-03 MED ORDER — WARFARIN SODIUM 5 MG PO TABS
5.0000 mg | ORAL_TABLET | Freq: Once | ORAL | Status: AC
  Administered 2016-12-03: 5 mg via ORAL
  Filled 2016-12-03: qty 1

## 2016-12-03 MED ORDER — FUROSEMIDE 40 MG PO TABS: 40.0000 mg | ORAL_TABLET | Freq: Every day | ORAL | Status: DC

## 2016-12-03 MED ORDER — SODIUM CHLORIDE 0.9 % IV SOLN
INTRAVENOUS | Status: DC
  Administered 2016-12-03: 22:00:00 via INTRAVENOUS

## 2016-12-03 NOTE — Progress Notes (Signed)
Patient Name: [name] Date of Encounter: 12/03/2016  Primary Cardiologist: Dr. [name]  Hospital Problem List     Principal Problem:   Acute on chronic diastolic CHF (congestive heart failure) ([place]) Active Problems:   HTN (hypertension)   Coronary artery disease   Atrial fibrillation ([place])   Long term current use of anticoagulant therapy     Subjective   Feels great, denies chest pain and SOB.   Inpatient Medications    Scheduled Meds: . furosemide  40 mg Intravenous BID  . insulin aspart  0-15 Units Subcutaneous TID WC  . losartan  25 mg Oral Daily  . metoprolol succinate  25 mg Oral Daily  . pantoprazole  20 mg Oral Daily  . potassium chloride  10 mEq Oral Daily  . sodium chloride flush  3 mL Intravenous Q12H  . warfarin  5 mg Oral ONCE-1800  . Warfarin - Pharmacist Dosing Inpatient   Does not apply q1800   Continuous Infusions:  PRN Meds: sodium chloride, acetaminophen, ALPRAZolam, ondansetron (ZOFRAN) IV, sodium chloride flush   Vital Signs    Vitals:   12/02/16 1629 12/02/16 2217 12/03/16 0609 12/03/16 0853  BP:  115/60 123/77 93/66  Pulse:  68 60 (!) 44  Resp:  18    Temp:  98 F (36.7 C) 97.2 F (36.2 C) (!) 101 F (38.3 C)  TempSrc:  Oral Oral   SpO2: 93% 94% 96% 92%  Weight:   194 lb 12.8 oz (88.4 kg)   Height:        Intake/Output Summary (Last 24 hours) at 12/03/16 0901 Last data filed at 12/03/16 0218  Gross per 24 hour  Intake              120 ml  Output              775 ml  Net             -655 ml   Filed Weights   12/01/16 0709 12/02/16 0611 12/03/16 0609  Weight: 204 lb 8 oz (92.8 kg) 197 lb 3.2 oz (89.4 kg) 194 lb 12.8 oz (88.4 kg)    Physical Exam    GEN: Well nourished, well developed, in no acute distress.  HEENT: Grossly normal.  Neck: Supple, no JVD, carotid bruits, or masses. Cardiac: irregularly irregular rhythm, no murmurs, rubs, or gallops. No clubbing, cyanosis, edema.  Radials/DP/PT 2+ and equal  bilaterally.  Respiratory:  Respirations regular and unlabored, clear to auscultation bilaterally. GI: Soft, nontender, nondistended, BS + x 4. MS: no deformity or atrophy. Skin: warm and dry, no rash. Neuro:  Strength and sensation are intact. Psych: AAOx3.  Normal affect.  Labs    Basic Metabolic Panel  Recent Labs  12/02/16 0409 12/03/16 0234  NA 134* 142  K 4.5 3.9  CL 96* 85*  CO2 25 23  GLUCOSE 93 75  BUN 26* 27*  CREATININE 1.06* 1.21*  CALCIUM 9.5 7.1*     Telemetry    Afib, rate controlled - Personally Reviewed  ECG     Afib- Personally Reviewed  Radiology    Dg Chest 2 View  Result Date: 12/02/2016 CLINICAL DATA:  Shortness of breath, CHF, hypertension and diabetes EXAM: CHEST  2 VIEW COMPARISON:  11/28/2016 FINDINGS: Previous coronary bypass changes noted. Marked cardiomegaly with vascular and interstitial prominence, suspicious for residual edema. Interstitial pattern is improved compared to 11/28/2016. Left mid lung and bibasilar atelectasis evident. No significant pleural effusion. No pneumothorax.  Trachea is midline. Aortic atherosclerosis present. Degenerative changes of the spine. Bones are osteopenic. IMPRESSION: Marked cardiomegaly with minimal improvement in the interstitial edema pattern compared to 11/28/2016. Background left mid lung and bibasilar atelectasis versus scarring. Electronically Signed   By: Jerilynn Mages.  Shick M.D.   On: 12/02/2016 14:05    Cardiac Studies   Transthoracic Echocardiography 11/29/16 Study Conclusions  - Left ventricle: The cavity size was normal. There was severe   concentric hypertrophy. Systolic function was vigorous. The   estimated ejection fraction was in the range of 70-75%. Wall   motion was normal; there were no regional wall motion   abnormalities. Doppler parameters are consistent with elevated   ventricular end-diastolic filling pressure. - Ventricular septum: The contour showed diastolic flattening and    systolic flattening. - Aortic valve: There was mild to moderate stenosis. Mean gradient   (S): 17 mm Hg. Peak gradient (S): 26 mm Hg. Valve area (VTI):   1.04 cm^2. Valve area (Vmax): 1.12 cm^2. Valve area (Vmean): 0.97   cm^2. - Mitral valve: Severely thickened, severely calcified leaflets .   Mobility was restricted. The findings are consistent with mild   stenosis. There was trivial regurgitation. Peak gradient (D): 12   mm Hg. Mean gradient 5 mmHg. - Left atrium: The atrium was moderately dilated. - Right ventricle: The cavity size was moderately dilated. Wall   thickness was normal. Systolic function was moderately reduced. - Tricuspid valve: There was moderate regurgitation. - Pulmonary arteries: Systolic pressure was severely increased. PA   peak pressure: 58 mm Hg (S). - Inferior vena cava: The vessel was dilated. The respirophasic   diameter changes were blunted (< 50%), consistent with elevated   central venous pressure.   Patient Profile  Alexandria Noreen Brownis a 82 y.o.femalewith past medical history of CAD (s/p single vessel CABG in 2007 with SVG-PDA secondary to dissection of attempted RCA angioplasty, DES to LCx in 2012, instent restenosis by cath in 12/2013 w/ DES placement at that time), chronic atrial fibrillation (on Coumadin), carotid artery disease (s/p R CEA with recurrent stenosis and subsequent stenting in 07/2015), HTN, HLD, chronic diastolic CHF, and iron deficiency anemia who presents to [facility] ED on 11/28/2016 for worsening dyspnea and chest discomfort, admitted with acute on chronic diastolic heart failure.     Assessment & Plan  1. Acute on Chronic Diastolic CHF: Echo shows vigorous LV systolic function with diastolic dysfunction, Mild to mod AS, mild MS. Continuelosartan 25 mg daily.   Creatinine increased to 1.21 today, she usually has normal renal function. Will discontinue IV lasix and transition to po.   2. Chest Pain/ Known CAD: s/p single vessel  CABG in 2007 with SVG-PDA secondary to dissection of attempted RCA angioplasty, DES to LCx in 2012, instent restenosis by cath in 12/2013 w/ DES placement at that time. Cath in 2015 showed 40% distal LM stenosis, 50% mid-LAD stenosis, 50% proxCx stenosis with 90% mid-Cx stenosis (DES placed), known CTO of RCA and patent SVG-PDA.  Troponin 0.04 with flat trend. EKG shows rate-controlled atrial fibrillation, HR in the 60s, with no acute ST or T-wave changes when compared to previous tracings.   Do not plan further ischemic evaluation at this time.  3. Chronic Atrial Fibrillation: This patients CHA2DS2-VASc Score and unadjusted Ischemic Stroke Rate (% per year) is equal to 11.2 % stroke rate/year from a score of 7(CHF, HTN, DM, Vascular, Female, Age (2)). On Coumadin prior to admission. INR therapeutic today. Coumadin dosing per pharmacy.  Continue Toprol-XL for rate-control. Rate is controlled in the 60s on telemetry.   4. HTN: Hypotensive this am.    Per nursing patient is more confused this am, although she was A&O when I talked with her. Sending a urine for culture and analysis as it has strong odor and she has a temp of 101.      Signed, Arbutus Leas, NP  12/03/2016, 9:01 AM    I have seen and examined the patient along with Arbutus Leas, NP .  I have reviewed the chart, notes and new data.  I agree with NP's note.  Key new complaints: breathing back to baseline, weight down 16 lb since admission Key examination changes: low grade fever Key new findings / data: waiting for UA  PLAN: Transition to PO diuretics, check for UTI. Potential DC tomorrow. Discussed Na restricted diet, daily weight monitoring, signs and sxs of CHF exacerbation.  Sanda Klein, MD, [facility] [phone] 12/03/2016, 11:33 AM

## 2016-12-03 NOTE — Progress Notes (Signed)
Patient currently in bed with low blood pressures, states that she feels ok, drowsy, NP ordered 500 cc of  Iv fluids bolus  IV Antibiotics, and Urine to be cultured.

## 2016-12-03 NOTE — Progress Notes (Signed)
ANTICOAGULATION CONSULT NOTE - Follow Up Consult  Pharmacy Consult for Coumadin Indication: atrial fibrillation  Allergies  Allergen Reactions  . Ace Inhibitors Other (See Comments)    Intolerance per Dr. Doug Sou note  . Amlodipine Other (See Comments)    Intolerance per Dr. Doug Sou note   . Statins Other (See Comments)    Muscle soreness  . Sulfa Drugs Cross Reactors Itching  . Zetia [Ezetimibe] Other (See Comments)    Muscle soreness  . Fenofibrate Other (See Comments)    Muscle soreness    Patient Measurements: Height: 5' 8.5" (174 cm) Weight: 194 lb 12.8 oz (88.4 kg) (a scale) IBW/kg (Calculated) : 65.05  Vital Signs: Temp: 97.2 F (36.2 C) (12/04 0609) Temp Source: Oral (12/04 0609) BP: 123/77 (12/04 0609) Pulse Rate: 60 (12/04 0609)  Labs:  Recent Labs  12/01/16 0248 12/02/16 0409 12/03/16 0234  LABPROT 24.3* 22.0* 23.7*  INR 2.15 1.90 2.08  CREATININE 1.08* 1.06* 1.21*    Estimated Creatinine Clearance: 42.8 mL/min (by C-G formula based on SCr of 1.21 mg/dL (H)).  Assessment: 83yof on Coumadin 5mg  daily exc for 7.5mg  on Thurs PTA for Afib. INR on admit was 3.08.  INR today is therapeutic at 2.08. CBC stable on 11/29. No s/s of bleed.   Goal of Therapy:  INR 2-3 Monitor platelets by anticoagulation protocol: Yes   Plan:  Give Coumadin 5mg  PO x 1 Monitor daily INR, CBC, s/s of bleed  [name], PharmD, [facility] Clinical Pharmacist Pager [phone] 12/03/2016 8:26 AM

## 2016-12-03 NOTE — Progress Notes (Signed)
Pharmacy note: Cipro and coumadin  82 yo female with UTI on coumadin for afib  to begin cipro. Today's INR= 2.08 -Warfarin has a moderate interaction with cipro. Onset of the INR effect often occurs within 2-5 days  Home dose:  Coumadin 5mg  daily exc for 7.5mg  on Thurs. (37.5mg /week)  Plan -Consider 10-15% reduction in the home dose -Could consider 5mg /day except take 2.5mg  on Thursday -Consider close INR follow-up at discharge  Hildred Laser, Pharm D 12/03/2016 5:58 PM

## 2016-12-03 NOTE — Care Management Important Message (Signed)
Important Message  Patient Details  Name: [name] MRN: [id number] Date of Birth: 1933/03/27   Medicare Important Message Given:  Yes    [name] Montine Circle 12/03/2016, 10:44 AM

## 2016-12-04 LAB — CBC
HEMATOCRIT: 38.1 % (ref 36.0–46.0)
HEMOGLOBIN: 11.9 g/dL — AB (ref 12.0–15.0)
MCH: 24.5 pg — AB (ref 26.0–34.0)
MCHC: 31.2 g/dL (ref 30.0–36.0)
MCV: 78.6 fL (ref 78.0–100.0)
Platelets: 168 10*3/uL (ref 150–400)
RBC: 4.85 MIL/uL (ref 3.87–5.11)
RDW: 18.4 % — ABNORMAL HIGH (ref 11.5–15.5)
WBC: 4.6 10*3/uL (ref 4.0–10.5)

## 2016-12-04 LAB — GLUCOSE, CAPILLARY
GLUCOSE-CAPILLARY: 93 mg/dL (ref 65–99)
Glucose-Capillary: 118 mg/dL — ABNORMAL HIGH (ref 65–99)

## 2016-12-04 LAB — PROTIME-INR
INR: 2.99
Prothrombin Time: 31.7 seconds — ABNORMAL HIGH (ref 11.4–15.2)

## 2016-12-04 MED ORDER — CEPHALEXIN 500 MG PO CAPS: 500.0000 mg | ORAL_CAPSULE | Freq: Two times a day (BID) | ORAL | 0 refills | Status: DC

## 2016-12-04 MED ORDER — LOSARTAN POTASSIUM 25 MG PO TABS: 25.0000 mg | ORAL_TABLET | Freq: Every day | ORAL | 12 refills | Status: DC

## 2016-12-04 MED ORDER — DEXTROSE 5 % IV SOLN
1.0000 g | INTRAVENOUS | Status: DC
  Administered 2016-12-04: 1 g via INTRAVENOUS
  Filled 2016-12-04: qty 10

## 2016-12-04 NOTE — Progress Notes (Signed)
Patient up and down to bathroom multiple times during 7 p to 7 a shift, asking when IV could be taken down.  Patient A&O x 4, needed some redirection at times, kept bed alarm on.

## 2016-12-04 NOTE — Discharge Summary (Signed)
Discharge Summary    Patient ID: [name],  MRN: [id number], DOB/AGE: Sep 27, 1933 82 y.o.  Admit date: 11/28/2016 Discharge date: 12/04/2016  Primary Care Provider: Garret Ware Primary Cardiologist: Dr. [name]  Discharge Diagnoses    Principal Problem:   Acute on chronic diastolic CHF (congestive heart failure) ([place]) Active Problems:   HTN (hypertension)   Coronary artery disease   Atrial fibrillation ([place])   Long term current use of anticoagulant therapy   Allergies Allergies  Allergen Reactions  . Ace Inhibitors Other (See Comments)    Intolerance per Dr. Doug Ware note  . Amlodipine Other (See Comments)    Intolerance per Dr. Doug Ware note   . Statins Other (See Comments)    Muscle soreness  . Sulfa Drugs Cross Reactors Itching  . Zetia [Ezetimibe] Other (See Comments)    Muscle soreness  . Fenofibrate Other (See Comments)    Muscle soreness    Diagnostic Studies/Procedures    Transthoracic Echocardiography 11/29/16 Study Conclusions  - Left ventricle: The cavity size was normal. There was severe   concentric hypertrophy. Systolic function was vigorous. The   estimated ejection fraction was in the range of 70-75%. Wall   motion was normal; there were no regional wall motion   abnormalities. Doppler parameters are consistent with elevated   ventricular end-diastolic filling pressure. - Ventricular septum: The contour showed diastolic flattening and   systolic flattening. - Aortic valve: There was mild to moderate stenosis. Mean gradient   (S): 17 mm Hg. Peak gradient (S): 26 mm Hg. Valve area (VTI):   1.04 cm^2. Valve area (Vmax): 1.12 cm^2. Valve area (Vmean): 0.97   cm^2. - Mitral valve: Severely thickened, severely calcified leaflets .   Mobility was restricted. The findings are consistent with mild   stenosis. There was trivial regurgitation. Peak gradient (D): 12   mm Hg. Mean gradient 5 mmHg. - Left atrium: The atrium was moderately  dilated. - Right ventricle: The cavity size was moderately dilated. Wall   thickness was normal. Systolic function was moderately reduced. - Tricuspid valve: There was moderate regurgitation. - Pulmonary arteries: Systolic pressure was severely increased. PA   peak pressure: 58 mm Hg (S). - Inferior vena cava: The vessel was dilated. The respirophasic   diameter changes were blunted (< 50%), consistent with elevated   central venous pressure.  _____________   History of Present Illness   [name] is a 82 y.o. female with past medical history of CAD (s/p single vessel CABG in 2007 with SVG-PDA secondary to dissection of attempted RCA angioplasty, DES to LCx in 2012, instent restenosis by cath in 12/2013 w/ DES placement at that time), chronic atrial fibrillation (on Coumadin), carotid artery disease (s/p R CEA with recurrent stenosis and subsequent stenting in 07/2015), HTN, HLD, chronic diastolic CHF, and iron deficiency anemia who presents to [name] on 11/28/2016 for worsening dyspnea and chest discomfort.   She reports having a "common cold" 3-4 weeks ago. She was seen by her PCP and he thought her symptoms were more consistent with a CHF exacerbation. She was instructed to take Lasix every other day and her weight was 206 lbs at that time.   In talking with the patient, she has only been taking PO Lasix 1-2 times per week. Unsure of the dose she is on. Says she does not take it regularly due to the frequent urination interfering with her plans. Over the past 4 days, she has noticed worsening dyspnea  with exertion and lower extremity edema. Denies any orthopnea or PND.  Does not have a scale at home and is unaware of her baseline weight. Weight was 206 lbs at the time of her office visit in 05/2016.  The day prior to admission she developed severe dyspnea while in exercise class and had to stop the class early. Also reports having episodic chest pressure which has occurred at  rest or with exertion for the past 2 days. She awoke with a mild pressure this morning which was relieved with 1 SL NTG. She has not had to take SL NTG in over a year until today. She denies any chest pressure at this current time.   Initial labs show a WBC of 7.7, Hgb 12.1, and platelets 247. K+ 4.7. Creatinine 0.85. BNP 1268. INR 3.08. Initial troponin negative. CXR shows CHF with pulmonary interstitial edema and small bilateral pleural effusions with probable underlying COPD. EKG shows rate-controlled atrial fibrillation, HR 88, with no acute ST or T-wave changes when compared to previous tracings.   Last cardiac catheterization was in 12/2013 which showed 40% distal LM stenosis, 50% mid-LAD stenosis, 50% proxCx stenosis with 90% mid-Cx stenosis, known CTO of RCA and patent SVG-PDA. Successful PCI of the LCx with a DES was performed at that time. She was on ASA 81 mg, Plavix 75 mg, and warfarin for 3 months and instructed to stop ASA at 3 months.      Hospital Course     She was admitted and diuresed. She developed a UTI and was febrile so IV rocephin was started. Urine culture still pending at the time of discharge so we will follow culture and change antibiotic if needed. Will send her home on Cephalexin 500mg  BID for 7 days.   Her Echo shows vigorous LV systolic function. We will continue losartan 25mg  daily, and Lasix 20mg  daily. She had some hypotension during admission, can consider increasing her lasix to 40mg  a day after UTI resolves.   Chest Pain/ Known CAD: s/p single vessel CABG in 2007 with SVG-PDA secondary to dissection of attempted RCA angioplasty, DES to LCx in 2012, instent restenosis by cath in 12/2013 w/ DES placement at that time. Cath in 2015 showed 40% distal LM stenosis, 50% mid-LAD stenosis, 50% proxCx stenosis with 90% mid-Cx stenosis (DES placed), known CTO of RCA and patent SVG-PDA.  Troponin 0.04 with flat trend. EKG shows rate-controlled atrial fibrillation, HR  in the 60s, with no acute ST or T-wave changes when compared to previous tracings.   Do not plan further ischemic evaluation at this time.  Chronic Atrial Fibrillation: This patients CHA2DS2-VASc Score and unadjusted Ischemic Stroke Rate (% per year) is equal to 11.2 % stroke rate/year from a score of 7(CHF, HTN, DM, Vascular, Female, Age (2)). On Coumadin prior to admission. INR therapeutic today. Coumadin dosing per pharmacy.  Continue Toprol-XL for rate-control.   She was seen today by Dr. Sallyanne Kuster and deemed suitable for discharge.   _____________  Discharge Vitals Blood pressure 101/66, pulse 63, temperature 97.6 F (36.4 C), temperature source Oral, resp. rate 18, height 5' 8.5" (1.74 m), weight 200 lb 9.6 oz (91 kg), SpO2 95 %.  Filed Weights   12/02/16 0611 12/03/16 0609 12/04/16 0456  Weight: 197 lb 3.2 oz (89.4 kg) 194 lb 12.8 oz (88.4 kg) 200 lb 9.6 oz (91 kg)    Labs & Radiologic Studies     CBC  Recent Labs  12/03/16 1650 12/04/16 0551  WBC 7.5 4.6  NEUTROABS 6.1  --   HGB 12.4 11.9*  HCT 39.5 38.1  MCV 79.2 78.6  PLT 181 [id number]   Basic Metabolic Panel  Recent Labs  12/02/16 0409 12/03/16 0234  NA 134* 142  K 4.5 3.9  CL 96* 85*  CO2 25 23  GLUCOSE 93 75  BUN 26* 27*  CREATININE 1.06* 1.21*  CALCIUM 9.5 7.1*    Dg Chest 2 View  Result Date: 12/02/2016 CLINICAL DATA:  Shortness of breath, CHF, hypertension and diabetes EXAM: CHEST  2 VIEW COMPARISON:  11/28/2016 FINDINGS: Previous coronary bypass changes noted. Marked cardiomegaly with vascular and interstitial prominence, suspicious for residual edema. Interstitial pattern is improved compared to 11/28/2016. Left mid lung and bibasilar atelectasis evident. No significant pleural effusion. No pneumothorax. Trachea is midline. Aortic atherosclerosis present. Degenerative changes of the spine. Bones are osteopenic. IMPRESSION: Marked cardiomegaly with minimal improvement in the interstitial edema  pattern compared to 11/28/2016. Background left mid lung and bibasilar atelectasis versus scarring. Electronically Signed   By: Jerilynn Mages.  Shick M.D.   On: 12/02/2016 14:05   Dg Chest 2 View  Result Date: 11/28/2016 CLINICAL DATA:  Two days of shortness of breath. No chest pain. History of coronary angioplasty and stent placement in 2015. CABG in 2007. EXAM: CHEST  2 VIEW COMPARISON:  CT scan of the chest of January 02, 2014 and chest x-ray of the same date FINDINGS: The lungs are mildly hyperinflated. There small bilateral pleural effusions greatest on the right. The interstitial markings are increased. The cardiac silhouette is enlarged and the pulmonary vascularity is engorged. The sternal wires are intact. There is calcification in the wall of the aortic arch. The observed bony thorax exhibits no acute abnormality. IMPRESSION: CHF with pulmonary interstitial edema and small bilateral pleural effusions. Probable underlying COPD. Electronically Signed   By: David  [name] M.D.   On: 11/28/2016 15:40    Disposition   Pt is being discharged home today in good condition.  Follow-up Plans & Appointments    Follow-up Information    Alexandria Reddish, MD. Go on 12/13/2016.   Specialty:  Family Medicine Why:  @1 :30pm Contact information: [place] [address] [phone]        Peter [name], MD Follow up on 12/11/2016.   Specialty:  Cardiology Why:  at 9:30am for hospital follow up. Contact information: [address] [place] [address] [phone]          Discharge Instructions    Diet - low sodium heart healthy    Complete by:  As directed    Increase activity slowly    Complete by:  As directed       Discharge Medications   Current Discharge Medication List    START taking these medications   Details  cephALEXin (KEFLEX) 500 MG capsule Take 1 capsule (500 mg total) by mouth 2 (two) times daily. Qty: 14 capsule, Refills: 0        CONTINUE these medications which have CHANGED   Details  losartan (COZAAR) 25 MG tablet Take 1 tablet (25 mg total) by mouth daily. Qty: 30 tablet, Refills: 12      CONTINUE these medications which have NOT CHANGED   Details  acetaminophen (TYLENOL) 500 MG tablet Take 1,000 mg by mouth every 8 (eight) hours as needed for moderate pain. Reported on 02/21/2016    ALPRAZolam (XANAX) 1 MG tablet Take 1 mg by mouth daily as needed for anxiety.     FLUoxetine (  PROZAC) 20 MG tablet Take 20 mg by mouth 3 (three) times a week.     furosemide (LASIX) 20 MG tablet Take 20 mg by mouth daily as needed for fluid or edema.     Loratadine 10 MG CAPS Take 1 capsule (10 mg total) by mouth daily. Qty: 30 each, Refills: 5    metFORMIN (GLUCOPHAGE) 500 MG tablet TAKE 1 TABLET (500 MG TOTAL) BY MOUTH DAILY WITH BREAKFAST. Qty: 30 tablet, Refills: 4    metoprolol succinate (TOPROL-XL) 25 MG 24 hr tablet TAKE 1 TABLET(S) BY MOUTH DAILY Qty: 30 tablet, Refills: 8    nitroGLYCERIN (NITROSTAT) 0.4 MG SL tablet Place 1 tablet (0.4 mg total) under the tongue every 5 (five) minutes as needed. For chest pain Qty: 25 tablet, Refills: 2   Associated Diagnoses: Coronary artery disease involving native coronary artery of native heart without angina pectoris    potassium chloride (K-DUR,KLOR-CON) 10 MEQ tablet Take 10 mEq by mouth daily as needed (TAKES WITH LASIX).     warfarin (COUMADIN) 5 MG tablet TAKE 1 TO 1.5 TABLETS BY MOUTH DAILY AS DIRECTED BY COUMADIN CLINIC Qty: 40 tablet, Refills: 3    albuterol (PROVENTIL HFA;VENTOLIN HFA) 108 (90 BASE) MCG/ACT inhaler Inhale 2 puffs into the lungs every 6 (six) hours as needed for wheezing or shortness of breath. Qty: 1 Inhaler, Refills: 0    clobetasol cream (TEMOVATE) 0.05 % APPLY NIGHTLY AS NEEDED FOR IRRITATION Qty: 60 g, Refills: 0      STOP taking these medications     omeprazole (PRILOSEC) 20 MG capsule           Outstanding Labs/Studies      Duration of Discharge Encounter   Greater than 30 minutes including physician time.  Signed, Arbutus Leas NP 12/04/2016, 2:37 PM

## 2016-12-04 NOTE — Progress Notes (Signed)
Pt. Is alert and oriented working with therapy with no distress, stable vitals with neg orthostatics. Poss. Discharge today this afternoon.

## 2016-12-04 NOTE — Progress Notes (Signed)
ANTICOAGULATION CONSULT NOTE - Follow Up Consult  Pharmacy Consult for Coumadin Indication: atrial fibrillation  Allergies  Allergen Reactions  . Ace Inhibitors Other (See Comments)    Intolerance per Dr. Doug Sou note  . Amlodipine Other (See Comments)    Intolerance per Dr. Doug Sou note   . Statins Other (See Comments)    Muscle soreness  . Sulfa Drugs Cross Reactors Itching  . Zetia [Ezetimibe] Other (See Comments)    Muscle soreness  . Fenofibrate Other (See Comments)    Muscle soreness    Patient Measurements: Height: 5' 8.5" (174 cm) Weight: 200 lb 9.6 oz (91 kg) IBW/kg (Calculated) : 65.05  Vital Signs: Temp: 97.6 F (36.4 C) (12/05 0456) Temp Source: Oral (12/05 0456) BP: 101/66 (12/05 0456) Pulse Rate: 63 (12/05 0456)   Assessment: 83yof on Coumadin 5mg  daily exc for 7.5mg  on Thurs PTA for Afib. INR on admit was 3.08.  INR today jumped from 2.08 to 2.99. Hgb 11.9, plts wnl. No s/s of bleed. Started on ciprofloxacin on 12/4, will plan to reduce weekly Coumadin dose while on ciprofloxacin.   Goal of Therapy:  INR 2-3 Monitor platelets by anticoagulation protocol: Yes   Plan:  Hold Coumadin tonight Monitor daily INR, CBC, s/s of bleed  [name], PharmD, BCPS Clinical Pharmacist Pager [phone] 12/04/2016 8:20 AM

## 2016-12-04 NOTE — Progress Notes (Signed)
Pt is alert and oriented with periods of forgetfulness, states that she is going to independent living at [facility], Discharge instructions was given with teach back, Heart healthy package was given and reviewed with patient, Off Cardiac monitor and waiting on daughter to arrive for transport.

## 2016-12-04 NOTE — Evaluation (Signed)
Physical Therapy Evaluation Patient Details Name: [name] MRN: [id number] DOB: Sep 11, 1933 Today's Date: 12/04/2016   History of Present Illness  [name] is a 82 y.o. female with past medical history of CAD (s/p single vessel CABG in 2007), chronic atrial fibrillation (on Coumadin), carotid artery disease (s/p R CEA with recurrent stenosis and subsequent stenting in 07/2015), HTN, HLD, chronic diastolic CHF, and iron deficiency anemia who presents to [facility] ED on 11/28/2016 for worsening dyspnea and chest discomfort.  pt with CHF exacerbationa and UTI  Clinical Impression  Pt not orthostatic.  Pt walked 300 feet with her RW - vitals stable.  Pt feels ready for DC.  Pt abel to verbalize her plans to get stronger.  Pt given basic HEP.  Will follow as long as she is in hospital.    Follow Up Recommendations No PT follow up;Supervision - Intermittent (pt able to verbalize her plan - to walk for 5 minutes and then increase to 7 minutes etc.)    Equipment Recommendations  None recommended by PT    Recommendations for Other Services       Precautions / Restrictions Precautions Precautions: None Precaution Comments: pt denies recent falls.  Pt denies dizziness Restrictions Weight Bearing Restrictions: No      Mobility  Bed Mobility Overal bed mobility: Independent                Transfers Overall transfer level: Modified independent Equipment used: Rolling walker (2 wheeled)             General transfer comment: I had pt do 5 sit to stands from recliner - cued to look up and use one hand (pt relies heavily on UEs).  Ambulation/Gait Ambulation/Gait assistance: Supervision Ambulation Distance (Feet): 300 Feet Assistive device: Rolling walker (2 wheeled) Gait Pattern/deviations: Trunk flexed     General Gait Details: cued pt to stand taller and not lean so heavily on rolling walker for support.  no loss of balance seen  Stairs             Wheelchair Mobility    Modified Rankin (Stroke Patients Only)       Balance Overall balance assessment: No apparent balance deficits (not formally assessed)                                           Pertinent Vitals/Pain Pain Assessment: No/denies pain    Home Living Family/patient expects to be discharged to:: Private residence (pt in independnent living at [facility]) Living Arrangements: Alone Available Help at Discharge:  (pt questioning if she is going to hire extra help intially at DC) Type of Home: Apartment Home Access: Level entry       Home Equipment: Walker - 2 wheels;Walker - 4 wheels;Cane - single point;Grab bars - toilet;Grab bars - tub/shower;Shower seat - built in      Prior Function Level of Independence: Independent with assistive device(s)         Comments: pt was driving and going out to pilates classes at [employer].  She was told there to use RW when going out in community     Hand Dominance        Extremity/Trunk Assessment               Lower Extremity Assessment: Generalized weakness      Cervical /  Trunk Assessment: Kyphotic (pt reminded to stand taller in upper trunk -she was abel to verbalize instructions she had heard prior to my visit)  Communication   Communication: HOH  Cognition Arousal/Alertness: Awake/alert Behavior During Therapy: WFL for tasks assessed/performed Overall Cognitive Status: Within Functional Limits for tasks assessed                      General Comments      Exercises     Assessment/Plan    PT Assessment Patient needs continued PT services  PT Problem List Decreased activity tolerance          PT Treatment Interventions Gait training;Functional mobility training;Therapeutic activities;Patient/family education    PT Goals (Current goals can be found in the Care Plan section)  Acute Rehab PT Goals Patient Stated Goal: to get back to her  apartment - she plans to wait a week before going out to her pilates class PT Goal Formulation: With patient Time For Goal Achievement: 12/11/16 Potential to Achieve Goals: Good    Frequency Min 3X/week   Barriers to discharge        Co-evaluation               End of Session Equipment Utilized During Treatment: Gait belt Activity Tolerance: Patient tolerated treatment well Patient left: in chair;with chair alarm set;with call bell/phone within reach Nurse Communication: Mobility status         Time: 1055-1140 PT Time Calculation (min) (ACUTE ONLY): 45 min   Charges:   PT Evaluation $PT Eval Moderate Complexity: 1 Procedure PT Treatments $Gait Training: 8-22 mins $Therapeutic Activity: 8-22 mins   PT G Codes:        Loyal Buba 12/04/2016, 11:51 AM 12/04/2016   Rande Lawman, PT

## 2016-12-04 NOTE — Progress Notes (Signed)
Patient Name: [name] Date of Encounter: 12/04/2016  Primary Cardiologist: Dr. [name]   Hospital Problem List     Principal Problem:   Acute on chronic diastolic CHF (congestive heart failure) ([place]) Active Problems:   HTN (hypertension)   Coronary artery disease   Atrial fibrillation ([place])   Long term current use of anticoagulant therapy     Subjective   Feels well this morning. Endorses urinary frequency. Says her breathing is much better.   Inpatient Medications    Scheduled Meds: . cefTRIAXone (ROCEPHIN)  IV  1 g Intravenous Q24H  . insulin aspart  0-15 Units Subcutaneous TID WC  . losartan  25 mg Oral Daily  . metoprolol succinate  25 mg Oral Daily  . pantoprazole  20 mg Oral Daily  . potassium chloride  10 mEq Oral Daily  . Warfarin - Pharmacist Dosing Inpatient   Does not apply q1800   Continuous Infusions: . sodium chloride 75 mL/hr at 12/03/16 2200   PRN Meds: acetaminophen, ALPRAZolam, bisacodyl, ondansetron (ZOFRAN) IV   Vital Signs    Vitals:   12/03/16 1614 12/03/16 1759 12/03/16 2047 12/04/16 0456  BP: (!) 75/48 (!) 123/91 (!) 95/48 101/66  Pulse: 60  66 63  Resp:   18 18  Temp:   100.2 F (37.9 C) 97.6 F (36.4 C)  TempSrc:   Oral Oral  SpO2:   97% 95%  Weight:    200 lb 9.6 oz (91 kg)  Height:        Intake/Output Summary (Last 24 hours) at 12/04/16 0900 Last data filed at 12/04/16 0506  Gross per 24 hour  Intake             1272 ml  Output              300 ml  Net              972 ml   Filed Weights   12/02/16 0611 12/03/16 0609 12/04/16 0456  Weight: 197 lb 3.2 oz (89.4 kg) 194 lb 12.8 oz (88.4 kg) 200 lb 9.6 oz (91 kg)    Physical Exam   GEN: Well nourished, well developed, in no acute distress.  HEENT: Grossly normal.  Neck: Supple, no JVD, carotid bruits, or masses. Cardiac: Irregularly irregular rhythm, no murmurs, rubs, or gallops. No clubbing, cyanosis, edema.  Radials/DP/PT 2+ and equal bilaterally.    Respiratory:  Respirations regular and unlabored, clear to auscultation bilaterally. GI: Soft, nontender, nondistended, BS + x 4. MS: no deformity or atrophy. Skin: warm and dry, no rash. Neuro:  Strength and sensation are intact. Psych: AAOx3.  Normal affect.  Labs    CBC  Recent Labs  12/03/16 1650 12/04/16 0551  WBC 7.5 4.6  NEUTROABS 6.1  --   HGB 12.4 11.9*  HCT 39.5 38.1  MCV 79.2 78.6  PLT 181 [id number]   Basic Metabolic Panel  Recent Labs  12/02/16 0409 12/03/16 0234  NA 134* 142  K 4.5 3.9  CL 96* 85*  CO2 25 23  GLUCOSE 93 75  BUN 26* 27*  CREATININE 1.06* 1.21*  CALCIUM 9.5 7.1*     Telemetry    Afib, rate controlled  - Personally Reviewed  ECG    Afib - Personally Reviewed  Radiology    Dg Chest 2 View  Result Date: 12/02/2016 CLINICAL DATA:  Shortness of breath, CHF, hypertension and diabetes EXAM: CHEST  2 VIEW COMPARISON:  11/28/2016 FINDINGS: Previous coronary bypass  changes noted. Marked cardiomegaly with vascular and interstitial prominence, suspicious for residual edema. Interstitial pattern is improved compared to 11/28/2016. Left mid lung and bibasilar atelectasis evident. No significant pleural effusion. No pneumothorax. Trachea is midline. Aortic atherosclerosis present. Degenerative changes of the spine. Bones are osteopenic. IMPRESSION: Marked cardiomegaly with minimal improvement in the interstitial edema pattern compared to 11/28/2016. Background left mid lung and bibasilar atelectasis versus scarring. Electronically Signed   By: Jerilynn Mages.  Shick M.D.   On: 12/02/2016 14:05    Cardiac Studies   Transthoracic Echocardiography 11/29/16 Study Conclusions  - Left ventricle: The cavity size was normal. There was severe concentric hypertrophy. Systolic function was vigorous. The estimated ejection fraction was in the range of 70-75%. Wall motion was normal; there were no regional wall motion abnormalities. Doppler parameters are  consistent with elevated ventricular end-diastolic filling pressure. - Ventricular septum: The contour showed diastolic flattening and systolic flattening. - Aortic valve: There was mild to moderate stenosis. Mean gradient (S): 17 mm Hg. Peak gradient (S): 26 mm Hg. Valve area (VTI): 1.04 cm^2. Valve area (Vmax): 1.12 cm^2. Valve area (Vmean): 0.97 cm^2. - Mitral valve: Severely thickened, severely calcified leaflets . Mobility was restricted. The findings are consistent with mild stenosis. There was trivial regurgitation. Peak gradient (D): 12 mm Hg. Mean gradient 5 mmHg. - Left atrium: The atrium was moderately dilated. - Right ventricle: The cavity size was moderately dilated. Wall thickness was normal. Systolic function was moderately reduced. - Tricuspid valve: There was moderate regurgitation. - Pulmonary arteries: Systolic pressure was severely increased. PA peak pressure: 58 mm Hg (S). - Inferior vena cava: The vessel was dilated. The respirophasic diameter changes were blunted (<50%), consistent with elevated central venous pressure.  Patient Profile     Alexandria Hulick Brownis a 82 y.o.femalewith past medical history of CAD (s/p single vessel CABG in 2007 with SVG-PDA secondary to dissection of attempted RCA angioplasty, DES to LCx in 2012, instent restenosis by cath in 12/2013 w/ DES placement at that time), chronic atrial fibrillation (on Coumadin), carotid artery disease (s/p R CEA with recurrent stenosis and subsequent stenting in 07/2015), HTN, HLD, chronic diastolic CHF, and iron deficiency anemia who presents to [facility] ED on 11/28/2016 for worsening dyspnea and chest discomfort, admitted with acute on chronic diastolic heart failure.    Assessment & Plan    1. Acute on Chronic Diastolic CHF: Echo shows vigorous LV systolic function with diastolic dysfunction, Mild to mod AS, mild MS. Continuelosartan 25 mg daily.   -4.8 L for admission. BMP  pending. Weight up 6 (?) pounds from yesterday.   Transitioned to po lasix yesterday, but had some hypotension in the afternoon and was given fluid. BP still soft, will hold losartan today if SBP < 100.   2. Chest Pain/ Known CAD: s/p single vessel CABG in 2007 with SVG-PDA secondary to dissection of attempted RCA angioplasty, DES to LCx in 2012, instent restenosis by cath in 12/2013 w/ DES placement at that time. Cath in 2015 showed 40% distal LM stenosis, 50% mid-LAD stenosis, 50% proxCx stenosis with 90% mid-Cx stenosis (DES placed), known CTO of RCA and patent SVG-PDA.  Troponin 0.04 with flat trend. EKG shows rate-controlled atrial fibrillation, HR in the 60s, with no acute ST or T-wave changes when compared to previous tracings.   Do not plan further ischemic evaluation at this time.  3. Chronic Atrial Fibrillation: This patients CHA2DS2-VASc Score and unadjusted Ischemic Stroke Rate (% per year) is equal to 11.2 %  stroke rate/year from a score of 7(CHF, HTN, DM, Vascular, Female, Age (2)). On Coumadin prior to admission. INR therapeutic today. Coumadin dosing per pharmacy.  Continue Toprol-XL for rate-control. Rate is controlled in the 60s on telemetry.   4. HTN: Hypotensive this am.    5. UTI: patient was more confused yesterday according to nursing, she was also febrile, UA had leukocytes. Treating her with Rocephin IV, until culture results.   Signed, Arbutus Leas, NP  12/04/2016, 9:00 AM    I have seen and examined the patient along with Arbutus Leas, NP.  I have reviewed the chart, notes and new data.  I agree with PA/NP's note.  Key new complaints: feels well Key examination changes: no clinical signs of CHF. In out reportedly +ve 1L yesterday, but UO charted as only 300 mL, suspect not accurate. Weight increase of 6 lb also difficult to believe. Key new findings / data: UA abnormal, UCx pending  PLAN: DC IV fluids. Check orthostatic BP. Ambulate. Possibly DC this  afternoon. Her daughter is driving in from [place] and family members will be available to help the remainder of the week. She will be going back to [facility]. We will not have sensitivities back for urine culture until tomorrow at the earliest and will have to follow up with her by phone if the meds need to be adjusted.  Sanda Klein, MD, [place] [phone] 12/04/2016, 10:43 AM

## 2016-12-05 ENCOUNTER — Telehealth: Payer: Self-pay | Admitting: *Deleted

## 2016-12-05 LAB — URINE CULTURE

## 2016-12-05 NOTE — Telephone Encounter (Signed)
[name] at [facility] states [name] returned home yesterday from Hospitalization and order given to check her INR tomorrow as they do labs on Thursday

## 2016-12-06 ENCOUNTER — Ambulatory Visit (INDEPENDENT_AMBULATORY_CARE_PROVIDER_SITE_OTHER): Payer: Medicare Other | Admitting: Pharmacist

## 2016-12-06 ENCOUNTER — Encounter: Payer: Self-pay | Admitting: Cardiology

## 2016-12-06 ENCOUNTER — Telehealth: Payer: Self-pay

## 2016-12-06 DIAGNOSIS — I4891 Unspecified atrial fibrillation: Secondary | ICD-10-CM | POA: Diagnosis not present

## 2016-12-06 DIAGNOSIS — Z7901 Long term (current) use of anticoagulants: Secondary | ICD-10-CM | POA: Diagnosis not present

## 2016-12-06 LAB — PROTIME-INR
INR: 2.7 — AB (ref 0.9–1.1)
INR: 2.7 — AB (ref <=1.1)

## 2016-12-06 NOTE — Telephone Encounter (Signed)
Spoke with pt and she states that she is doing well. She has moved in to [facility]. She is meeting with a dietician to manage her diet. She has been out and walking and feels good.   Pt scheduled to see Dr Yong Channel 12/13/16. Pt aware.    Transition Care Management Follow-up Telephone Call  How have you been since you were released from the hospital? Very good   Do you understand why you were in the hospital? yes   Do you understand the discharge instructions? yes  Items Reviewed:  Medications reviewed: yes  Allergies reviewed: yes  Dietary changes reviewed: yes  Referrals reviewed: yes   Functional Questionnaire:   Activities of Daily Living (ADLs):   She states they are independent in the following: ambulation, bathing and hygiene, feeding, continence, grooming, toileting and dressing States they require assistance with the following: none   Any transportation issues/concerns?: no   Any patient concerns? no   Confirmed importance and date/time of follow-up visits scheduled: yes   Confirmed with patient if condition begins to worsen call PCP or go to the ER.  Patient was given the Call-a-Nurse line [phone]: yes

## 2016-12-07 DIAGNOSIS — M2042 Other hammer toe(s) (acquired), left foot: Secondary | ICD-10-CM | POA: Diagnosis not present

## 2016-12-07 DIAGNOSIS — E1159 Type 2 diabetes mellitus with other circulatory complications: Secondary | ICD-10-CM | POA: Diagnosis not present

## 2016-12-07 DIAGNOSIS — M2041 Other hammer toe(s) (acquired), right foot: Secondary | ICD-10-CM | POA: Diagnosis not present

## 2016-12-07 DIAGNOSIS — L84 Corns and callosities: Secondary | ICD-10-CM | POA: Diagnosis not present

## 2016-12-07 NOTE — Telephone Encounter (Signed)
D/C 12/04/16 To: home
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
## 2016-12-11 ENCOUNTER — Ambulatory Visit (INDEPENDENT_AMBULATORY_CARE_PROVIDER_SITE_OTHER): Payer: Medicare Other | Admitting: Cardiology

## 2016-12-11 ENCOUNTER — Encounter: Payer: Self-pay | Admitting: Cardiology

## 2016-12-11 VITALS — BP 125/68 | HR 80 | Ht 68.5 in | Wt 194.0 lb

## 2016-12-11 DIAGNOSIS — E7800 Pure hypercholesterolemia, unspecified: Secondary | ICD-10-CM | POA: Diagnosis not present

## 2016-12-11 DIAGNOSIS — I4891 Unspecified atrial fibrillation: Secondary | ICD-10-CM

## 2016-12-11 DIAGNOSIS — I6523 Occlusion and stenosis of bilateral carotid arteries: Secondary | ICD-10-CM

## 2016-12-11 DIAGNOSIS — I5033 Acute on chronic diastolic (congestive) heart failure: Secondary | ICD-10-CM

## 2016-12-11 DIAGNOSIS — I10 Essential (primary) hypertension: Secondary | ICD-10-CM

## 2016-12-11 NOTE — Patient Instructions (Signed)
We will check blood work today  Continue your current therapy. Take your lasix daily  Watch your salt intake  We will have you return in one month.

## 2016-12-12 LAB — BASIC METABOLIC PANEL
BUN: 18 mg/dL (ref 7–25)
CO2: 25 mmol/L (ref 20–31)
CREATININE: 0.77 mg/dL (ref 0.60–0.88)
Calcium: 9.3 mg/dL (ref 8.6–10.4)
Chloride: 101 mmol/L (ref 98–110)
GLUCOSE: 104 mg/dL — AB (ref 65–99)
Potassium: 4.6 mmol/L (ref 3.5–5.3)
Sodium: 136 mmol/L (ref 135–146)

## 2016-12-12 LAB — BRAIN NATRIURETIC PEPTIDE: BRAIN NATRIURETIC PEPTIDE: 1146.2 pg/mL — AB (ref <100)

## 2016-12-13 ENCOUNTER — Other Ambulatory Visit: Payer: Self-pay | Admitting: Family Medicine

## 2016-12-13 ENCOUNTER — Encounter: Payer: Self-pay | Admitting: Family Medicine

## 2016-12-13 ENCOUNTER — Ambulatory Visit (INDEPENDENT_AMBULATORY_CARE_PROVIDER_SITE_OTHER): Payer: Medicare Other | Admitting: Family Medicine

## 2016-12-13 VITALS — BP 102/66 | HR 69 | Temp 97.4°F | Ht 68.5 in | Wt 199.0 lb

## 2016-12-13 DIAGNOSIS — I10 Essential (primary) hypertension: Secondary | ICD-10-CM

## 2016-12-13 DIAGNOSIS — I5032 Chronic diastolic (congestive) heart failure: Secondary | ICD-10-CM

## 2016-12-13 DIAGNOSIS — I4891 Unspecified atrial fibrillation: Secondary | ICD-10-CM | POA: Diagnosis not present

## 2016-12-13 DIAGNOSIS — I2510 Atherosclerotic heart disease of native coronary artery without angina pectoris: Secondary | ICD-10-CM

## 2016-12-13 DIAGNOSIS — D509 Iron deficiency anemia, unspecified: Secondary | ICD-10-CM

## 2016-12-13 MED ORDER — FUROSEMIDE 20 MG PO TABS: 20.0000 mg | ORAL_TABLET | Freq: Every day | ORAL | 3 refills | Status: DC

## 2016-12-13 NOTE — Progress Notes (Signed)
Pre visit review using our clinic review tool, if applicable. No additional management support is needed unless otherwise documented below in the visit note. 

## 2016-12-13 NOTE — Progress Notes (Signed)
Subjective:  [name] is a 82 y.o. year old very pleasant female patient who presents for transitional care management and hospital follow up for diastolic CHF. Patient was hospitalized from 11/28/16 to 12/04/16. A TCM phone call was completed on 12/06/16. Medical complexity moderate.   [name] is a 82 year old female with history of diastolic CHF, CAD, atrial fibrillation on coumadin, and HTN who presented to the hospital with worsening shortness of breath and was found to be on acute on chronic diastolic CHF.   I had seen patient weeks ago with what she thought was a common cold and she had some SOB and weight gain at time- I suggested likely CHF related and we increased lasix to every other day and she noted improvement. Had encouraged continued daily 20mg  dosing but the extra urination got in the way of her activities so she stopped and cut down to 1-2x per week. During this time frame SOB and edema worsened- was not weight at home as we had discussed in past. This acutely worsened during exercise class day before admission. Also developed some chest pain whih resolved with dose of nitroglycerin. Review of initial labs showed BNP 1268 and creatinine 0.85 as well as INR 3.08 and initial troponin not elevated. She had a chest x-ray which I independently reviewed which shows interstitial edema and slight pleural effusions. Also independently reviewed x-ray 12/02/16 which shows persistent edema only mildly improved but with no evidence of pleural effusion. I also reviewed ekg from 11/28/16 which shows atrial fibrillation rate controlled without any st or t wave changes. Cardiology did not suggest further cardiac workup for known CAD as ches tpain thought related to CHF. For atrial fibrillation maintained on coumadin and on toprol XL for rate control.   Updated Echocardiogram completed in hospital showed EF 70-75%, findings suggestive of diastolic dysfunction,mild to moderate aortic stenosis, mild mitral  stenosis.   She also had a UTI and was feibrile initially and was on iv rocephin later changed to keflex 500mg  BID for 7 days but final urine culture did not show clear UTI. She is not having any UTI symptoms at present  She has been compliant with lasix and states she needs a refill. She now notices a significant difference pre and post lasix. Her peak weight before more regular lasix was 211.6 and she is now 199 on our scales. She also has had cardiology follow up and agrees to take lasix daily and in fact feels like she is urinating less frequently now with her lasix doses.   Mild anemia persisted I nhospital   Past Medical History-  Patient Active Problem List   Diagnosis Date Noted  . Anemia, iron deficiency 05/15/2016    Priority: High  . Non-proliferative diabetic retinopathy, mild, left eye ([place]) 10/10/2015    Priority: High  . Atrial fibrillation ([place]) 01/08/2014    Priority: High  . Diastolic CHF, chronic ([place]) 01/02/2014    Priority: High  . Type II diabetes mellitus with ophthalmic manifestations ([place]) 01/02/2014    Priority: High  . Coronary artery disease     Priority: High  . Carotid artery stenosis 04/01/2012    Priority: High  . Depression 03/25/2015    Priority: Medium  . Spinal stenosis     Priority: Medium  . HTN (hypertension) 08/29/2011    Priority: Medium  . Hyperlipidemia 07/18/2011    Priority: Medium  . Allergic rhinitis 11/19/2015    Priority: Low  . Edema leg 05/26/2015  Priority: Low  . GERD (gastroesophageal reflux disease) 03/25/2015    Priority: Low  . Long term current use of anticoagulant therapy 01/08/2014    Priority: Low  . Lichen sclerosus     Priority: Low  . Obesities, morbid ([place]) 07/18/2011    Priority: Low    Medications- reviewed and updated Current Outpatient Prescriptions  Medication Sig Dispense Refill  . acetaminophen (TYLENOL) 500 MG tablet Take 1,000 mg by mouth every 8 (eight) hours as needed for moderate pain.  Reported on 02/21/2016    . ALPRAZolam (XANAX) 1 MG tablet Take 1 mg by mouth daily as needed for anxiety.     . clobetasol cream (TEMOVATE) 0.05 % APPLY NIGHTLY AS NEEDED FOR IRRITATION 60 g 0  . furosemide (LASIX) 20 MG tablet Take 1 tablet (20 mg total) by mouth daily. 90 tablet 3  . Loratadine 10 MG CAPS Take 1 capsule (10 mg total) by mouth daily. (Patient taking differently: Take 10 mg by mouth daily as needed (allergies). ) 30 each 5  . losartan (COZAAR) 25 MG tablet Take 1 tablet (25 mg total) by mouth daily. 30 tablet 12  . metFORMIN (GLUCOPHAGE) 500 MG tablet TAKE 1 TABLET (500 MG TOTAL) BY MOUTH DAILY WITH BREAKFAST. 30 tablet 4  . metoprolol succinate (TOPROL-XL) 25 MG 24 hr tablet TAKE 1 TABLET(S) BY MOUTH DAILY 30 tablet 8  . nitroGLYCERIN (NITROSTAT) 0.4 MG SL tablet Place 1 tablet (0.4 mg total) under the tongue every 5 (five) minutes as needed. For chest pain 25 tablet 2  . potassium chloride (K-DUR,KLOR-CON) 10 MEQ tablet Take 10 mEq by mouth daily as needed (TAKES WITH LASIX).     [name] warfarin (COUMADIN) 5 MG tablet TAKE 1 TO 1.5 TABLETS BY MOUTH DAILY AS DIRECTED BY COUMADIN CLINIC (Patient taking differently: TAKES 7.5MG  ON THURS ONLY, TAKES 5MG  ALL OTHER DAYS) 40 tablet 3   No current facility-administered medications for this visit.     Objective: BP 102/66 (BP Location: Left Arm, Patient Position: Sitting, Cuff Size: Large)   Pulse 69   Temp 97.4 F (36.3 C) (Oral)   Ht 5' 8.5" (1.74 m)   Wt 199 lb (90.3 kg)   SpO2 98%   BMI 29.82 kg/m  Gen: NAD, resting comfortably CV: irregularly irregular. Systolic ejection murmur.  Lungs: CTAB no crackles, wheeze, rhonchi Abdomen: soft/nontender/nondistended/normal bowel sounds. No rebound or guarding.  Ext: trace edema- improved from prior Skin: warm, dry  Assessment/Plan:  Diastolic CHF, chronic (HCC) Well controlled on lasix 20mg  daily- continue current dose. Extensive counseling on weights, signs of heart failure. She  feels much more confident to follow this. She will call if weight increases as discussed  Iron deficiency anemia, unspecified iron deficiency anemia type Anemia noted in hospital- continue iron. Likely avm related  Coronary artery disease, angina presence unspecified, unspecified vessel or lesion type, unspecified whether native or transplanted heart No recurrent chest pain- was likely CHF related . No further ischemic workup  Atrial fibrillation, unspecified type ([place]) Rate controlled on metoprolol XL 25mg . Anticoagulated on coumadin  Essential hypertension bp controlled on lasix, metoprolol, losartan  1 month cardiology, 2 month PCP visit  Meds ordered this encounter  Medications  . furosemide (LASIX) 20 MG tablet    Sig: Take 1 tablet (20 mg total) by mouth daily.    Dispense:  90 tablet    Refill:  3    Return precautions advised.  [name], MD

## 2016-12-13 NOTE — Patient Instructions (Signed)
Continue daily lasix  Continue to watch salt intake- every once and a while perhaps every other week can have a slice of pizza or chick fil a sandwich but watch fluid status carefully with these  Continue daily weights  Follow up 2 months. Glad you are seeing cardiology next month.   Do not hesitate to call with questions about fluid status   Do you use the internet?

## 2016-12-18 ENCOUNTER — Other Ambulatory Visit: Payer: Self-pay | Admitting: Cardiology

## 2016-12-19 ENCOUNTER — Other Ambulatory Visit: Payer: Self-pay | Admitting: *Deleted

## 2016-12-19 NOTE — Telephone Encounter (Signed)
Rx(s) sent to pharmacy electronically.  

## 2016-12-27 ENCOUNTER — Ambulatory Visit: Payer: Medicare Other | Admitting: Podiatry

## 2017-01-01 ENCOUNTER — Other Ambulatory Visit: Payer: Self-pay | Admitting: *Deleted

## 2017-01-01 MED ORDER — POTASSIUM CHLORIDE ER 10 MEQ PO TBCR: 10.0000 meq | EXTENDED_RELEASE_TABLET | Freq: Every day | ORAL | 3 refills | Status: DC

## 2017-01-03 ENCOUNTER — Ambulatory Visit (INDEPENDENT_AMBULATORY_CARE_PROVIDER_SITE_OTHER): Payer: Medicare Other | Admitting: Pharmacist

## 2017-01-03 DIAGNOSIS — I4891 Unspecified atrial fibrillation: Secondary | ICD-10-CM

## 2017-01-03 DIAGNOSIS — Z7901 Long term (current) use of anticoagulants: Secondary | ICD-10-CM | POA: Diagnosis not present

## 2017-01-03 LAB — POCT INR: INR: 1.9

## 2017-01-04 DIAGNOSIS — L89892 Pressure ulcer of other site, stage 2: Secondary | ICD-10-CM | POA: Diagnosis not present

## 2017-01-04 DIAGNOSIS — E1159 Type 2 diabetes mellitus with other circulatory complications: Secondary | ICD-10-CM | POA: Diagnosis not present

## 2017-01-04 DIAGNOSIS — E11621 Type 2 diabetes mellitus with foot ulcer: Secondary | ICD-10-CM | POA: Diagnosis not present

## 2017-01-07 ENCOUNTER — Other Ambulatory Visit: Payer: Self-pay | Admitting: Cardiology

## 2017-01-11 ENCOUNTER — Ambulatory Visit (INDEPENDENT_AMBULATORY_CARE_PROVIDER_SITE_OTHER): Payer: Medicare Other | Admitting: Physician Assistant

## 2017-01-11 ENCOUNTER — Encounter: Payer: Self-pay | Admitting: Physician Assistant

## 2017-01-11 VITALS — BP 141/77 | HR 90 | Ht 68.0 in | Wt 190.4 lb

## 2017-01-11 DIAGNOSIS — D509 Iron deficiency anemia, unspecified: Secondary | ICD-10-CM

## 2017-01-11 DIAGNOSIS — I25810 Atherosclerosis of coronary artery bypass graft(s) without angina pectoris: Secondary | ICD-10-CM

## 2017-01-11 DIAGNOSIS — Z79899 Other long term (current) drug therapy: Secondary | ICD-10-CM

## 2017-01-11 DIAGNOSIS — I482 Chronic atrial fibrillation, unspecified: Secondary | ICD-10-CM

## 2017-01-11 DIAGNOSIS — I5032 Chronic diastolic (congestive) heart failure: Secondary | ICD-10-CM | POA: Diagnosis not present

## 2017-01-11 DIAGNOSIS — E785 Hyperlipidemia, unspecified: Secondary | ICD-10-CM

## 2017-01-11 DIAGNOSIS — I10 Essential (primary) hypertension: Secondary | ICD-10-CM

## 2017-01-11 DIAGNOSIS — D649 Anemia, unspecified: Secondary | ICD-10-CM
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
## 2017-01-11 NOTE — Patient Instructions (Signed)
Medication Instructions:  Continue current medications  Labwork: BMP, CBC, Anemia Panel   Testing/Procedures: None Ordered  Follow-Up: Your physician recommends that you schedule a follow-up appointment in: 3 Months with Dr [name]   Any Other Special Instructions Will Be Listed Below (If Applicable).   If you need a refill on your cardiac medications before your next appointment, please call your pharmacy.

## 2017-01-12 ENCOUNTER — Encounter: Payer: Self-pay | Admitting: Physician Assistant

## 2017-01-14 DIAGNOSIS — D649 Anemia, unspecified: Secondary | ICD-10-CM | POA: Diagnosis not present

## 2017-01-14 DIAGNOSIS — Z79899 Other long term (current) drug therapy: Secondary | ICD-10-CM | POA: Diagnosis not present

## 2017-01-14 LAB — CBC
HCT: 41.1 % (ref 35.0–45.0)
Hemoglobin: 12.9 g/dL (ref 11.7–15.5)
MCH: 25 pg — AB (ref 27.0–33.0)
MCHC: 31.4 g/dL — AB (ref 32.0–36.0)
MCV: 79.7 fL — ABNORMAL LOW (ref 80.0–100.0)
MPV: 11.4 fL (ref 7.5–12.5)
PLATELETS: 279 10*3/uL (ref 140–400)
RBC: 5.16 MIL/uL — AB (ref 3.80–5.10)
RDW: 17.7 % — AB (ref 11.0–15.0)
WBC: 8.4 10*3/uL (ref 3.8–10.8)

## 2017-01-15 LAB — BASIC METABOLIC PANEL
BUN: 20 mg/dL (ref 7–25)
CALCIUM: 9.5 mg/dL (ref 8.6–10.4)
CO2: 27 mmol/L (ref 20–31)
CREATININE: 0.93 mg/dL — AB (ref 0.60–0.88)
Chloride: 102 mmol/L (ref 98–110)
GLUCOSE: 110 mg/dL — AB (ref 65–99)
Potassium: 4 mmol/L (ref 3.5–5.3)
Sodium: 139 mmol/L (ref 135–146)

## 2017-01-15 LAB — IRON AND TIBC
%SAT: 10 % — ABNORMAL LOW (ref 11–50)
Iron: 48 ug/dL (ref 45–160)
TIBC: 458 ug/dL — ABNORMAL HIGH (ref 250–450)
UIBC: 410 ug/dL — ABNORMAL HIGH (ref 125–400)

## 2017-01-15 LAB — FERRITIN: FERRITIN: 21 ng/mL (ref 20–288)

## 2017-01-24 ENCOUNTER — Ambulatory Visit (INDEPENDENT_AMBULATORY_CARE_PROVIDER_SITE_OTHER): Payer: Medicare Other | Admitting: Interventional Cardiology

## 2017-01-24 DIAGNOSIS — Z7901 Long term (current) use of anticoagulants: Secondary | ICD-10-CM | POA: Diagnosis not present

## 2017-01-24 DIAGNOSIS — I4891 Unspecified atrial fibrillation: Secondary | ICD-10-CM | POA: Diagnosis not present

## 2017-01-24 DIAGNOSIS — I482 Chronic atrial fibrillation, unspecified: Secondary | ICD-10-CM

## 2017-01-24 LAB — PROTIME-INR: INR: 1.8 — AB (ref 0.9–1.1)

## 2017-02-06 ENCOUNTER — Encounter: Payer: Self-pay | Admitting: Cardiology

## 2017-02-06 ENCOUNTER — Ambulatory Visit: Payer: Medicare Other

## 2017-02-07 ENCOUNTER — Ambulatory Visit (INDEPENDENT_AMBULATORY_CARE_PROVIDER_SITE_OTHER): Payer: Medicare Other | Admitting: Cardiovascular Disease

## 2017-02-07 DIAGNOSIS — I4891 Unspecified atrial fibrillation: Secondary | ICD-10-CM | POA: Diagnosis not present

## 2017-02-07 DIAGNOSIS — I482 Chronic atrial fibrillation, unspecified: Secondary | ICD-10-CM

## 2017-02-07 LAB — PROTIME-INR: INR: 1.7 — AB (ref 0.9–1.1)

## 2017-02-08 DIAGNOSIS — L89892 Pressure ulcer of other site, stage 2: Secondary | ICD-10-CM | POA: Diagnosis not present

## 2017-02-08 DIAGNOSIS — E1159 Type 2 diabetes mellitus with other circulatory complications: Secondary | ICD-10-CM | POA: Diagnosis not present

## 2017-02-08 DIAGNOSIS — E11621 Type 2 diabetes mellitus with foot ulcer: Secondary | ICD-10-CM | POA: Diagnosis not present

## 2017-02-12 ENCOUNTER — Ambulatory Visit: Payer: Medicare Other | Admitting: Podiatry

## 2017-02-14 ENCOUNTER — Ambulatory Visit (INDEPENDENT_AMBULATORY_CARE_PROVIDER_SITE_OTHER): Payer: Medicare Other | Admitting: Podiatry

## 2017-02-14 VITALS — Ht 68.0 in | Wt 190.0 lb

## 2017-02-14 DIAGNOSIS — B351 Tinea unguium: Secondary | ICD-10-CM | POA: Diagnosis not present

## 2017-02-14 DIAGNOSIS — M79675 Pain in left toe(s): Secondary | ICD-10-CM | POA: Diagnosis not present

## 2017-02-14 DIAGNOSIS — E119 Type 2 diabetes mellitus without complications: Secondary | ICD-10-CM

## 2017-02-14 NOTE — Progress Notes (Signed)
Patient ID: [name], female   DOB: [birth year], 83 y.o.   MRN: [id number] Complaint:  Visit Type: Patient returns to my office for continued preventative foot care services. Complaint: Patient states" my nails have grown long and thick and become painful to walk and wear shoes. The patient presents for preventative foot care services. No changes to ROS  Podiatric Exam: Vascular: dorsalis pedis and posterior tibial pulses are palpable bilateral. Capillary return is immediate. Temperature gradient is WNL. Skin turgor WNL  Sensorium: Normal Semmes Weinstein monofilament test. Normal tactile sensation bilaterally. Nail Exam: Pt has thick disfigured discolored nails with subungual debris noted bilateral entire nail hallux through fifth toenails Ulcer Exam: There is no evidence of ulcer or pre-ulcerative changes or infection. Orthopedic Exam: Muscle tone and strength are WNL. No limitations in general ROM. No crepitus or effusions noted. Foot type and digits show no abnormalities. Bony prominences are unremarkable. Skin: No Porokeratosis. No infection or ulcers.  Corn 4th right and distal clavi 3rd left.  Diagnosis:  Onychomycosis, , Pain in right toe, pain in left toes, Debride callus  Treatment & Plan Procedures and Treatment: Consent by patient was obtained for treatment procedures. The patient understood the discussion of treatment and procedures well. All questions were answered thoroughly reviewed. Debridement of mycotic and hypertrophic toenails, 1 through 5 bilateral and clearing of subungual debris. No ulceration, no infection noted.  Return Visit-Office Procedure: Patient instructed to return to the office for a follow up visit 3 months for continued evaluation and treatment.  Gardiner Barefoot DPM

## 2017-02-21 ENCOUNTER — Ambulatory Visit (INDEPENDENT_AMBULATORY_CARE_PROVIDER_SITE_OTHER): Payer: Medicare Other | Admitting: Cardiology

## 2017-02-21 DIAGNOSIS — Z7901 Long term (current) use of anticoagulants: Secondary | ICD-10-CM | POA: Diagnosis not present

## 2017-02-21 DIAGNOSIS — I4891 Unspecified atrial fibrillation: Secondary | ICD-10-CM | POA: Diagnosis not present

## 2017-02-21 DIAGNOSIS — I482 Chronic atrial fibrillation, unspecified: Secondary | ICD-10-CM

## 2017-02-21 LAB — PROTIME-INR: INR: 1.8 — AB (ref 0.9–1.1)

## 2017-03-05 ENCOUNTER — Other Ambulatory Visit: Payer: Self-pay

## 2017-03-05 MED ORDER — METFORMIN HCL 500 MG PO TABS: ORAL_TABLET | ORAL | 4 refills | Status: DC

## 2017-03-07 ENCOUNTER — Ambulatory Visit (INDEPENDENT_AMBULATORY_CARE_PROVIDER_SITE_OTHER): Payer: Medicare Other | Admitting: Interventional Cardiology

## 2017-03-07 DIAGNOSIS — Z7901 Long term (current) use of anticoagulants: Secondary | ICD-10-CM | POA: Diagnosis not present

## 2017-03-07 DIAGNOSIS — I482 Chronic atrial fibrillation, unspecified: Secondary | ICD-10-CM

## 2017-03-07 DIAGNOSIS — I4891 Unspecified atrial fibrillation: Secondary | ICD-10-CM | POA: Diagnosis not present

## 2017-03-07 LAB — PROTIME-INR: INR: 1.6 — AB (ref 0.9–1.1)

## 2017-03-08 DIAGNOSIS — B351 Tinea unguium: Secondary | ICD-10-CM | POA: Diagnosis not present

## 2017-03-08 DIAGNOSIS — E1159 Type 2 diabetes mellitus with other circulatory complications: Secondary | ICD-10-CM | POA: Diagnosis not present

## 2017-03-08 DIAGNOSIS — L89892 Pressure ulcer of other site, stage 2: Secondary | ICD-10-CM | POA: Diagnosis not present

## 2017-03-08 DIAGNOSIS — E11621 Type 2 diabetes mellitus with foot ulcer: Secondary | ICD-10-CM | POA: Diagnosis not present

## 2017-03-08 DIAGNOSIS — L84 Corns and callosities: Secondary | ICD-10-CM | POA: Diagnosis not present

## 2017-03-08 DIAGNOSIS — L03032 Cellulitis of left toe: Secondary | ICD-10-CM | POA: Diagnosis not present

## 2017-03-08 DIAGNOSIS — L89893 Pressure ulcer of other site, stage 3: Secondary | ICD-10-CM | POA: Diagnosis not present

## 2017-03-18 ENCOUNTER — Ambulatory Visit (INDEPENDENT_AMBULATORY_CARE_PROVIDER_SITE_OTHER): Payer: Medicare Other | Admitting: Interventional Cardiology

## 2017-03-18 DIAGNOSIS — I482 Chronic atrial fibrillation, unspecified: Secondary | ICD-10-CM

## 2017-03-18 DIAGNOSIS — Z7901 Long term (current) use of anticoagulants: Secondary | ICD-10-CM | POA: Diagnosis not present

## 2017-03-18 DIAGNOSIS — I4891 Unspecified atrial fibrillation: Secondary | ICD-10-CM | POA: Diagnosis not present

## 2017-03-18 LAB — PROTIME-INR: INR: 3.2 — AB (ref 0.9–1.1)

## 2017-03-22 ENCOUNTER — Encounter: Payer: Self-pay | Admitting: Family Medicine

## 2017-03-22 ENCOUNTER — Ambulatory Visit (INDEPENDENT_AMBULATORY_CARE_PROVIDER_SITE_OTHER): Payer: Medicare Other | Admitting: Family Medicine

## 2017-03-22 VITALS — BP 118/70 | HR 80 | Temp 97.8°F | Ht 68.0 in | Wt 201.6 lb

## 2017-03-22 DIAGNOSIS — D509 Iron deficiency anemia, unspecified: Secondary | ICD-10-CM

## 2017-03-22 DIAGNOSIS — I10 Essential (primary) hypertension: Secondary | ICD-10-CM | POA: Diagnosis not present

## 2017-03-22 DIAGNOSIS — I5032 Chronic diastolic (congestive) heart failure: Secondary | ICD-10-CM | POA: Diagnosis not present

## 2017-03-22 DIAGNOSIS — I482 Chronic atrial fibrillation, unspecified: Secondary | ICD-10-CM

## 2017-03-22 DIAGNOSIS — I25810 Atherosclerosis of coronary artery bypass graft(s) without angina pectoris: Secondary | ICD-10-CM

## 2017-03-22 DIAGNOSIS — E1139 Type 2 diabetes mellitus with other diabetic ophthalmic complication: Secondary | ICD-10-CM

## 2017-03-22 LAB — COMPREHENSIVE METABOLIC PANEL
ALBUMIN: 4.2 g/dL (ref 3.5–5.2)
ALT: 9 U/L (ref 0–35)
AST: 15 U/L (ref 0–37)
Alkaline Phosphatase: 90 U/L (ref 39–117)
BUN: 24 mg/dL — ABNORMAL HIGH (ref 6–23)
CHLORIDE: 102 meq/L (ref 96–112)
CO2: 26 mEq/L (ref 19–32)
Calcium: 9.3 mg/dL (ref 8.4–10.5)
Creatinine, Ser: 0.83 mg/dL (ref 0.40–1.20)
GFR: 70.01 mL/min (ref >60.00)
GLUCOSE: 78 mg/dL (ref 70–99)
POTASSIUM: 4.8 meq/L (ref 3.5–5.1)
SODIUM: 138 meq/L (ref 135–145)
Total Bilirubin: 1.1 mg/dL (ref 0.2–1.2)
Total Protein: 6.8 g/dL (ref 6.0–8.3)

## 2017-03-22 LAB — CBC
HEMATOCRIT: 38 % (ref 36.0–46.0)
Hemoglobin: 11.9 g/dL — ABNORMAL LOW (ref 12.0–15.0)
MCHC: 31.3 g/dL (ref 30.0–36.0)
MCV: 78.8 fl (ref 78.0–100.0)
Platelets: 282 10*3/uL (ref 150.0–400.0)
RBC: 4.83 Mil/uL (ref 3.87–5.11)
RDW: 17.7 % — AB (ref 11.5–15.5)
WBC: 8.7 10*3/uL (ref 4.0–10.5)

## 2017-03-22 LAB — FERRITIN: Ferritin: 17.7 ng/mL (ref 10.0–291.0)

## 2017-03-22 LAB — HEMOGLOBIN A1C: HEMOGLOBIN A1C: 6.3 % (ref 4.6–6.5)

## 2017-03-22 NOTE — Assessment & Plan Note (Signed)
S: patient took some time off iron and just restarted in last 4 days. Has felt more fatigued with this and slightly more winded with exertion though states fatigue more significant than breathing issues. Compliant with lasix and no increase in home weights around 194 or 195. No orthopnea A/P: she wants to know if her iron has worsened- check cbc and ferritin today

## 2017-03-22 NOTE — Progress Notes (Signed)
Subjective:  [name] is a 83 y.o. year old very pleasant female patient who presents for/with See problem oriented charting ROS- mild fatigue, no orthopnea or chest pain, very slight increase in shortness of breath   Past Medical History-  Patient Active Problem List   Diagnosis Date Noted  . Anemia, iron deficiency 05/15/2016    Priority: High  . Non-proliferative diabetic retinopathy, mild, left eye ([place]) 10/10/2015    Priority: High  . Atrial fibrillation ([place]) 01/08/2014    Priority: High  . Diastolic CHF, chronic ([place]) 01/02/2014    Priority: High  . Type II diabetes mellitus with ophthalmic manifestations ([place]) 01/02/2014    Priority: High  . Coronary artery disease     Priority: High  . Carotid artery stenosis 04/01/2012    Priority: High  . Depression 03/25/2015    Priority: Medium  . Spinal stenosis     Priority: Medium  . HTN (hypertension) 08/29/2011    Priority: Medium  . Hyperlipidemia 07/18/2011    Priority: Medium  . Allergic rhinitis 11/19/2015    Priority: Low  . Edema leg 05/26/2015    Priority: Low  . GERD (gastroesophageal reflux disease) 03/25/2015    Priority: Low  . Long term current use of anticoagulant therapy 01/08/2014    Priority: Low  . Lichen sclerosus     Priority: Low    Medications- reviewed and updated Current Outpatient Prescriptions  Medication Sig Dispense Refill  . ALPRAZolam (XANAX) 1 MG tablet Take 1 mg by mouth daily as needed for anxiety.     . clobetasol cream (TEMOVATE) 0.05 % APPLY NIGHTLY AS NEEDED FOR IRRITATION 60 g 0  . furosemide (LASIX) 20 MG tablet Take 1 tablet (20 mg total) by mouth daily. 90 tablet 3  . loratadine (CLARITIN) 10 MG tablet Take 10 mg by mouth daily as needed for allergies.    [name] losartan (COZAAR) 25 MG tablet Take 1 tablet (25 mg total) by mouth daily. 30 tablet 12  . metFORMIN (GLUCOPHAGE) 500 MG tablet TAKE 1 TABLET (500 MG TOTAL) BY MOUTH DAILY WITH BREAKFAST. 30 tablet 4  . metoprolol  succinate (TOPROL-XL) 25 MG 24 hr tablet TAKE 1 TABLET(S) BY MOUTH DAILY 30 tablet 8  . nitroGLYCERIN (NITROSTAT) 0.4 MG SL tablet Place 1 tablet (0.4 mg total) under the tongue every 5 (five) minutes as needed. For chest pain 25 tablet 2  . potassium chloride (K-DUR) 10 MEQ tablet Take 1 tablet (10 mEq total) by mouth daily. 90 tablet 3  . warfarin (COUMADIN) 5 MG tablet TAKE 1 TO 1.5 TABLETS BY MOUTH DAILY AS DIRECTED BY COUMADIN CLINIC 40 tablet 2   No current facility-administered medications for this visit.     Objective: BP 118/70   Pulse 80   Temp 97.8 F (36.6 C) (Oral)   Ht 5\' 8"  (1.727 m)   Wt 201 lb 9.6 oz (91.4 kg)   SpO2 93%   BMI 30.65 kg/m  Gen: NAD, resting comfortably TM normal- minimal cerumen CV: irregular but rate controlled.  Lungs: CTAB no crackles, wheeze, rhonchi Ext: trace edema Skin: warm, dry  Assessment/Plan:  HTN (hypertension) S: controlled on Metoprolol 25 mg xl. Losartan 25mg . .  BP Readings from Last 3 Encounters:  03/22/17 118/70  01/11/17 (!) 141/77  12/13/16 102/66  A/P:Continue current meds:  Initial BP was low but improved on repeat. Doubt Bp as cause of weak feeling   Anemia, iron deficiency S: patient took some time off iron and  just restarted in last 4 days. Has felt more fatigued with this and slightly more winded with exertion though states fatigue more significant than breathing issues. Compliant with lasix and no increase in home weights around 194 or 195. No orthopnea A/P: she wants to know if her iron has worsened- check cbc and ferritin today   Type II diabetes mellitus with ophthalmic manifestations ([place]) S: well controlled. On metformin 500mg  alone Lab Results  Component Value Date   HGBA1C 6.2 09/18/2016   HGBA1C 7.1 (H) 11/18/2015   HGBA1C 6.7 (H) 08/12/2015   A/P: update a1c with labs   Diastolic CHF, chronic (HCC) S: as noted in anemia section- compliant with lasix. No increased edema or weight gain.  A/P:  continue lasix 20mg  daily- symptoms seem to be controlled on this. Think issue may be from not taking her iron instead of her chf but discussed if sob worsens needs to let us know.    Atrial fibrillation (HCC) S: compliant with coumadin and metoprolol A/P: rate controlled and anticoagulated. Likely AVM contributes to anemia issues. Continue coumadin with stroke risk   14 weeks at latest  Orders Placed This Encounter  Procedures  . CBC    [place]  . Comprehensive metabolic panel    [place]  . Hemoglobin A1c    [place]  . Ferritin    No orders of the defined types were placed in this encounter.   Return precautions advised.  [name], MD

## 2017-03-22 NOTE — Assessment & Plan Note (Signed)
S: compliant with coumadin and metoprolol A/P: rate controlled and anticoagulated. Likely AVM contributes to anemia issues. Continue coumadin with stroke risk

## 2017-03-22 NOTE — Patient Instructions (Signed)
Please stop by lab before you go. Also checking blood counts, kidneys, liver, and diabetes.

## 2017-03-22 NOTE — Assessment & Plan Note (Signed)
S: controlled on Metoprolol 25 mg xl. Losartan 25mg . .  BP Readings from Last 3 Encounters:  03/22/17 118/70  01/11/17 (!) 141/77  12/13/16 102/66  A/P:Continue current meds:  Initial BP was low but improved on repeat. Doubt Bp as cause of weak feeling

## 2017-03-22 NOTE — Assessment & Plan Note (Signed)
S: well controlled. On metformin 500mg  alone Lab Results  Component Value Date   HGBA1C 6.2 09/18/2016   HGBA1C 7.1 (H) 11/18/2015   HGBA1C 6.7 (H) 08/12/2015   A/P: update a1c with labs

## 2017-03-22 NOTE — Progress Notes (Signed)
Pre visit review using our clinic review tool, if applicable. No additional management support is needed unless otherwise documented below in the visit note. 

## 2017-03-22 NOTE — Assessment & Plan Note (Signed)
S: as noted in anemia section- compliant with lasix. No increased edema or weight gain.  A/P: continue lasix 20mg  daily- symptoms seem to be controlled on this. Think issue may be from not taking her iron instead of her chf but discussed if sob worsens needs to let us know.

## 2017-03-28 ENCOUNTER — Ambulatory Visit (INDEPENDENT_AMBULATORY_CARE_PROVIDER_SITE_OTHER): Payer: Medicare Other

## 2017-03-28 DIAGNOSIS — I482 Chronic atrial fibrillation, unspecified: Secondary | ICD-10-CM

## 2017-03-28 DIAGNOSIS — I4891 Unspecified atrial fibrillation: Secondary | ICD-10-CM | POA: Diagnosis not present

## 2017-03-28 DIAGNOSIS — Z7901 Long term (current) use of anticoagulants: Secondary | ICD-10-CM | POA: Diagnosis not present

## 2017-03-28 LAB — PROTIME-INR: INR: 2.7 — AB (ref 0.9–1.1)

## 2017-03-30 ENCOUNTER — Other Ambulatory Visit: Payer: Self-pay | Admitting: Gastroenterology

## 2017-04-02 ENCOUNTER — Other Ambulatory Visit: Payer: Self-pay | Admitting: Gastroenterology

## 2017-04-04 ENCOUNTER — Telehealth: Payer: Self-pay | Admitting: Family Medicine

## 2017-04-04 MED ORDER — OMEPRAZOLE 20 MG PO CPDR
20.0000 mg | DELAYED_RELEASE_CAPSULE | Freq: Every day | ORAL | 3 refills | Status: AC
Start: 2017-04-04 — End: ?

## 2017-04-04 NOTE — Telephone Encounter (Signed)
Filled   Please inform patient

## 2017-04-04 NOTE — Telephone Encounter (Signed)
Pt need new Rx for omeprazole 20 MG   Pharm:  [facility]

## 2017-04-05 DIAGNOSIS — E1159 Type 2 diabetes mellitus with other circulatory complications: Secondary | ICD-10-CM | POA: Diagnosis not present

## 2017-04-05 DIAGNOSIS — E11621 Type 2 diabetes mellitus with foot ulcer: Secondary | ICD-10-CM | POA: Diagnosis not present

## 2017-04-05 DIAGNOSIS — L89892 Pressure ulcer of other site, stage 2: Secondary | ICD-10-CM | POA: Diagnosis not present

## 2017-04-06 ENCOUNTER — Other Ambulatory Visit: Payer: Self-pay | Admitting: Cardiology

## 2017-04-08 NOTE — Telephone Encounter (Signed)
Pt c/o Shortness Of Breath: STAT if SOB developed within the last 24 hours or pt is noticeably SOB on the phone  1. Are you currently SOB (can you hear that pt is SOB on the phone)? no 2. How long have you been experiencing SOB? All the time  3. Are you SOB when sitting or when up moving around? Up moving around 4. Are you currently experiencing any other symptoms? no

## 2017-04-11 ENCOUNTER — Ambulatory Visit (INDEPENDENT_AMBULATORY_CARE_PROVIDER_SITE_OTHER): Payer: Medicare Other | Admitting: Cardiovascular Disease

## 2017-04-11 DIAGNOSIS — I482 Chronic atrial fibrillation, unspecified: Secondary | ICD-10-CM

## 2017-04-11 DIAGNOSIS — I4891 Unspecified atrial fibrillation: Secondary | ICD-10-CM | POA: Diagnosis not present

## 2017-04-11 DIAGNOSIS — Z7901 Long term (current) use of anticoagulants: Secondary | ICD-10-CM | POA: Diagnosis not present

## 2017-04-11 LAB — PROTIME-INR: INR: 6.7 — AB (ref 0.9–1.1)

## 2017-04-13 ENCOUNTER — Other Ambulatory Visit: Payer: Self-pay | Admitting: Cardiology
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
## 2017-04-15 ENCOUNTER — Encounter: Payer: Self-pay | Admitting: Cardiology

## 2017-04-15 ENCOUNTER — Ambulatory Visit (INDEPENDENT_AMBULATORY_CARE_PROVIDER_SITE_OTHER): Payer: Medicare Other | Admitting: Cardiology

## 2017-04-15 VITALS — BP 105/77 | HR 89 | Ht 68.0 in | Wt 206.6 lb

## 2017-04-15 DIAGNOSIS — I482 Chronic atrial fibrillation, unspecified: Secondary | ICD-10-CM

## 2017-04-15 DIAGNOSIS — D509 Iron deficiency anemia, unspecified: Secondary | ICD-10-CM | POA: Diagnosis not present

## 2017-04-15 DIAGNOSIS — I5033 Acute on chronic diastolic (congestive) heart failure: Secondary | ICD-10-CM

## 2017-04-15 DIAGNOSIS — I25810 Atherosclerosis of coronary artery bypass graft(s) without angina pectoris: Secondary | ICD-10-CM | POA: Diagnosis not present

## 2017-04-15 LAB — CBC WITH DIFFERENTIAL/PLATELET
BASOS ABS: 88 {cells}/uL (ref 0–200)
Basophils Relative: 1 %
Eosinophils Absolute: 88 cells/uL (ref 15–500)
Eosinophils Relative: 1 %
HEMATOCRIT: 39.7 % (ref 35.0–45.0)
HEMOGLOBIN: 12.5 g/dL (ref 11.7–15.5)
LYMPHS ABS: 968 {cells}/uL (ref 850–3900)
Lymphocytes Relative: 11 %
MCH: 24.8 pg — AB (ref 27.0–33.0)
MCHC: 31.5 g/dL — AB (ref 32.0–36.0)
MCV: 78.8 fL — ABNORMAL LOW (ref 80.0–100.0)
MONO ABS: 704 {cells}/uL (ref 200–950)
MPV: 9.8 fL (ref 7.5–12.5)
Monocytes Relative: 8 %
NEUTROS PCT: 79 %
Neutro Abs: 6952 cells/uL (ref 1500–7800)
Platelets: 307 10*3/uL (ref 140–400)
RBC: 5.04 MIL/uL (ref 3.80–5.10)
RDW: 17.6 % — ABNORMAL HIGH (ref 11.0–15.0)
WBC: 8.8 10*3/uL (ref 3.8–10.8)

## 2017-04-15 NOTE — Patient Instructions (Signed)
Increase lasix and potassium to twice a day for 4 days   We will check blood work today  Continue all your medication  Sodium restriction

## 2017-04-16 ENCOUNTER — Other Ambulatory Visit: Payer: Self-pay

## 2017-04-16 LAB — BASIC METABOLIC PANEL
BUN: 23 mg/dL (ref 7–25)
CHLORIDE: 103 mmol/L (ref 98–110)
CO2: 18 mmol/L — AB (ref 20–31)
Calcium: 9.5 mg/dL (ref 8.6–10.4)
Creat: 0.86 mg/dL (ref 0.60–0.88)
GLUCOSE: 98 mg/dL (ref 65–99)
POTASSIUM: 4.8 mmol/L (ref 3.5–5.3)
SODIUM: 138 mmol/L (ref 135–146)

## 2017-04-16 LAB — BRAIN NATRIURETIC PEPTIDE: Brain Natriuretic Peptide: 1616.2 pg/mL — ABNORMAL HIGH (ref <100)

## 2017-04-18 ENCOUNTER — Ambulatory Visit (INDEPENDENT_AMBULATORY_CARE_PROVIDER_SITE_OTHER): Payer: Medicare Other | Admitting: Interventional Cardiology

## 2017-04-18 DIAGNOSIS — I482 Chronic atrial fibrillation, unspecified: Secondary | ICD-10-CM

## 2017-04-18 DIAGNOSIS — Z7901 Long term (current) use of anticoagulants: Secondary | ICD-10-CM | POA: Diagnosis not present

## 2017-04-18 DIAGNOSIS — I4891 Unspecified atrial fibrillation: Secondary | ICD-10-CM | POA: Diagnosis not present

## 2017-04-18 LAB — PROTIME-INR: INR: 4.5 — AB (ref 0.9–1.1)

## 2017-04-29 ENCOUNTER — Ambulatory Visit (INDEPENDENT_AMBULATORY_CARE_PROVIDER_SITE_OTHER): Payer: Medicare Other | Admitting: Pharmacist

## 2017-04-29 ENCOUNTER — Ambulatory Visit (INDEPENDENT_AMBULATORY_CARE_PROVIDER_SITE_OTHER): Payer: Medicare Other | Admitting: Student

## 2017-04-29 ENCOUNTER — Encounter: Payer: Self-pay | Admitting: Student

## 2017-04-29 VITALS — BP 119/80 | HR 74 | Ht 68.0 in | Wt 195.4 lb

## 2017-04-29 DIAGNOSIS — I350 Nonrheumatic aortic (valve) stenosis: Secondary | ICD-10-CM | POA: Diagnosis not present

## 2017-04-29 DIAGNOSIS — I5032 Chronic diastolic (congestive) heart failure: Secondary | ICD-10-CM

## 2017-04-29 DIAGNOSIS — I10 Essential (primary) hypertension: Secondary | ICD-10-CM

## 2017-04-29 DIAGNOSIS — I482 Chronic atrial fibrillation, unspecified: Secondary | ICD-10-CM

## 2017-04-29 DIAGNOSIS — I25810 Atherosclerosis of coronary artery bypass graft(s) without angina pectoris: Secondary | ICD-10-CM | POA: Diagnosis not present

## 2017-04-29 DIAGNOSIS — Z7901 Long term (current) use of anticoagulants: Secondary | ICD-10-CM | POA: Diagnosis not present

## 2017-04-29 LAB — POCT INR: INR: 2.8
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
## 2017-04-29 NOTE — Patient Instructions (Addendum)
Medication Instructions:  DECREASE- Lasix 20 mg daily and Potassium 10 meq daily, Take an extra dose if needed for Edema  Labwork: None Ordered  Testing/Procedures: None Ordered  Follow-Up: Your physician recommends that you schedule a follow-up appointment in: Keep appointment with Dr [name] on July 16th @ 2:40 pm   Any Other Special Instructions Will Be Listed Below (If Applicable).   If you need a refill on your cardiac medications before your next appointment, please call your pharmacy.

## 2017-04-30 ENCOUNTER — Telehealth: Payer: Self-pay | Admitting: *Deleted

## 2017-04-30 MED ORDER — FUROSEMIDE 40 MG PO TABS: 40.0000 mg | ORAL_TABLET | Freq: Every day | ORAL | 0 refills | Status: DC

## 2017-04-30 NOTE — Telephone Encounter (Signed)
Spoke with pt letting her know she should take 40 mg of lasix instead of 20 mg, pt voice understanding

## 2017-04-30 NOTE — Telephone Encounter (Signed)
-----   Message from Erma Heritage, [place] sent at 04/29/2017  9:49 PM EDT ----- Regarding: Lasix Dosing Hi Nya,   This patient's Lasix dosing was suppose to be reduced from 40mg  BID to 40mg  daily. It looks like 20mg  daily showed up on her AVS. Can you please call and verify with her to take 40mg  daily?   Thank you!  [place],  [place]

## 2017-05-01 DIAGNOSIS — L97512 Non-pressure chronic ulcer of other part of right foot with fat layer exposed: Secondary | ICD-10-CM | POA: Diagnosis not present

## 2017-05-01 DIAGNOSIS — M79672 Pain in left foot: Secondary | ICD-10-CM | POA: Diagnosis not present

## 2017-05-01 DIAGNOSIS — M2041 Other hammer toe(s) (acquired), right foot: Secondary | ICD-10-CM | POA: Diagnosis not present

## 2017-05-01 DIAGNOSIS — M2042 Other hammer toe(s) (acquired), left foot: Secondary | ICD-10-CM | POA: Diagnosis not present

## 2017-05-01 DIAGNOSIS — L97522 Non-pressure chronic ulcer of other part of left foot with fat layer exposed: Secondary | ICD-10-CM | POA: Diagnosis not present

## 2017-05-01 DIAGNOSIS — M79671 Pain in right foot: Secondary | ICD-10-CM | POA: Diagnosis not present

## 2017-05-10 DIAGNOSIS — L308 Other specified dermatitis: Secondary | ICD-10-CM | POA: Diagnosis not present

## 2017-05-16 DIAGNOSIS — I509 Heart failure, unspecified: Secondary | ICD-10-CM | POA: Diagnosis not present

## 2017-05-16 DIAGNOSIS — L97522 Non-pressure chronic ulcer of other part of left foot with fat layer exposed: Secondary | ICD-10-CM | POA: Diagnosis not present

## 2017-05-16 DIAGNOSIS — D689 Coagulation defect, unspecified: Secondary | ICD-10-CM | POA: Diagnosis not present

## 2017-05-16 LAB — PROTIME-INR: INR: 4.2 — AB (ref 0.9–1.1)

## 2017-05-17 ENCOUNTER — Ambulatory Visit (INDEPENDENT_AMBULATORY_CARE_PROVIDER_SITE_OTHER): Payer: Medicare Other | Admitting: Cardiology

## 2017-05-17 DIAGNOSIS — I482 Chronic atrial fibrillation, unspecified: Secondary | ICD-10-CM

## 2017-05-30 ENCOUNTER — Ambulatory Visit (INDEPENDENT_AMBULATORY_CARE_PROVIDER_SITE_OTHER): Payer: Medicare Other

## 2017-05-30 DIAGNOSIS — I482 Chronic atrial fibrillation, unspecified: Secondary | ICD-10-CM

## 2017-05-30 DIAGNOSIS — I4891 Unspecified atrial fibrillation: Secondary | ICD-10-CM | POA: Diagnosis not present

## 2017-05-30 LAB — PROTIME-INR: INR: 3 — AB (ref 0.9–1.1)

## 2017-05-31 ENCOUNTER — Encounter: Payer: Self-pay | Admitting: Family Medicine

## 2017-05-31 ENCOUNTER — Ambulatory Visit (INDEPENDENT_AMBULATORY_CARE_PROVIDER_SITE_OTHER): Payer: Medicare Other | Admitting: Family Medicine

## 2017-05-31 VITALS — BP 108/72 | HR 89 | Temp 97.4°F | Ht 68.0 in | Wt 202.8 lb

## 2017-05-31 DIAGNOSIS — I5032 Chronic diastolic (congestive) heart failure: Secondary | ICD-10-CM

## 2017-05-31 DIAGNOSIS — I25810 Atherosclerosis of coronary artery bypass graft(s) without angina pectoris: Secondary | ICD-10-CM

## 2017-05-31 DIAGNOSIS — R197 Diarrhea, unspecified: Secondary | ICD-10-CM | POA: Diagnosis not present

## 2017-05-31 NOTE — Patient Instructions (Signed)
I want you to take lasix 40mg  twice a day for next 3 days.   I also want you to either eat activia yogurt once a day or take a probiotic like align or florastor (can talk with your pharmacist about probiotic options)  See me back in 2 weeks to reassess

## 2017-05-31 NOTE — Progress Notes (Addendum)
Subjective:  [name] is a 83 y.o. year old very pleasant female patient who presents for/with See problem oriented charting ROS- has some fatigue and weight gain. Mild increase in edema. No fever or chills   Past Medical History-  Patient Active Problem List   Diagnosis Date Noted  . Anemia, iron deficiency 05/15/2016    Priority: High  . Non-proliferative diabetic retinopathy, mild, left eye ([place]) 10/10/2015    Priority: High  . Atrial fibrillation ([place]) 01/08/2014    Priority: High  . Diastolic CHF, chronic ([place]) 01/02/2014    Priority: High  . Type II diabetes mellitus with ophthalmic manifestations ([place]) 01/02/2014    Priority: High  . Coronary artery disease     Priority: High  . Carotid artery stenosis 04/01/2012    Priority: High  . Depression 03/25/2015    Priority: Medium  . Spinal stenosis     Priority: Medium  . HTN (hypertension) 08/29/2011    Priority: Medium  . Hyperlipidemia 07/18/2011    Priority: Medium  . Allergic rhinitis 11/19/2015    Priority: Low  . Edema leg 05/26/2015    Priority: Low  . GERD (gastroesophageal reflux disease) 03/25/2015    Priority: Low  . Long term current use of anticoagulant therapy 01/08/2014    Priority: Low  . Lichen sclerosus     Priority: Low    Medications- reviewed and updated Current Outpatient Prescriptions  Medication Sig Dispense Refill  . ALPRAZolam (XANAX) 1 MG tablet Take 1 mg by mouth daily as needed for anxiety.     . clobetasol cream (TEMOVATE) 0.05 % APPLY NIGHTLY AS NEEDED FOR IRRITATION 60 g 0  . furosemide (LASIX) 40 MG tablet Take 1 tablet (40 mg total) by mouth daily. 90 tablet 0  . loratadine (CLARITIN) 10 MG tablet Take 10 mg by mouth daily as needed for allergies.    [name] losartan (COZAAR) 25 MG tablet Take 1 tablet (25 mg total) by mouth daily. 30 tablet 12  . metFORMIN (GLUCOPHAGE) 500 MG tablet TAKE 1 TABLET (500 MG TOTAL) BY MOUTH DAILY WITH BREAKFAST. 30 tablet 4  . metoprolol succinate  (TOPROL-XL) 25 MG 24 hr tablet TAKE 1 TABLET(S) BY MOUTH DAILY 30 tablet 11  . nitroGLYCERIN (NITROSTAT) 0.4 MG SL tablet Place 1 tablet (0.4 mg total) under the tongue every 5 (five) minutes as needed. For chest pain 25 tablet 2  . omeprazole (PRILOSEC) 20 MG capsule Take 1 capsule (20 mg total) by mouth daily. 90 capsule 3  . potassium chloride (K-DUR) 10 MEQ tablet Take 10 mEq by mouth daily.  90 tablet 3  . warfarin (COUMADIN) 5 MG tablet TAKE 1 TO 1.5 TABLETS BY MOUTH DAILY AS DIRECTED BY COUMADIN CLINIC 40 tablet 2   No current facility-administered medications for this visit.     Objective: BP 108/72 (BP Location: Left Arm, Patient Position: Sitting, Cuff Size: Normal)   Pulse 89   Temp 97.4 F (36.3 C) (Oral)   Ht 5\' 8"  (1.727 m)   Wt 202 lb 12.8 oz (92 kg)   SpO2 93%   BMI 30.84 kg/m  Gen: NAD, resting comfortably Mucous membranes are moist. CV: RRR no murmurs rubs or gallops Lungs: CTAB no crackles, wheeze, rhonchi Abdomen: soft/nontender/nondistended/normal bowel sounds. No rebound or guarding.  Ext: 1+ edema Skin: warm, dry  Assessment/Plan:  Diarrhea of presumed infectious origin S: went out to eat 2 weeks ago. She had severe diarrhea when she got out of the car -  ran down leg. Has never had something like this before. Very nauseous and threw up as well. Had eaten chicken salad that day before this event.   Since then has had similar event every other day since that time. Has to run to bathroom quickly. Not throwing up. No abdominal pain at this point. Very mild nausea. Diarrhea is slowing down A/P: This could have been related to the chicken salad and mayonaise she had that day. Will attempt to reset gut flora with probiotics. Already improving so think she will continue to improve. Advised 2 week reevaluation. Was slightly hesitant to increase lasix with diarrhea but it is improving so though treasonable.   Diastolic CHF, chronic (HCC) S: patient denies depression  or anhedonia but states she just doesn't feel like she has the energy to get out and be active recently. Her weight is up 7 lbs since last cardiology visit. Edema up some too and seems more winded with activity. Takes lasix 40mg  once daily. Wt Readings from Last 3 Encounters:  05/31/17 202 lb 12.8 oz (92 kg)  04/29/17 195 lb 6.4 oz (88.6 kg)  04/15/17 206 lb 9.6 oz (93.7 kg)  A/P: decided to increase lasix 40mg  to BID for 3 days to see if this helps with symptoms. Only doing 3 days with her diarrhea. Reassess 2 weeks and may need to do longer course of increase.   2 weeks advised- noted patient did not schedule- will have scheduling reach out.   Chronic issue worsening with medication management- CHF, acute issue diarrhea in high risk patient with CHF  Return precautions advised.  [name], MD

## 2017-06-01 NOTE — Addendum Note (Signed)
Addended by: [name] on: 06/01/2017 10:27 AM   Modules accepted: Level of Service

## 2017-06-01 NOTE — Assessment & Plan Note (Signed)
S: patient denies depression or anhedonia but states she just doesn't feel like she has the energy to get out and be active recently. Her weight is up 7 lbs since last cardiology visit. Edema up some too and seems more winded with activity. Takes lasix 40mg  once daily. Wt Readings from Last 3 Encounters:  05/31/17 202 lb 12.8 oz (92 kg)  04/29/17 195 lb 6.4 oz (88.6 kg)  04/15/17 206 lb 9.6 oz (93.7 kg)  A/P: decided to increase lasix 40mg  to BID for 3 days to see if this helps with symptoms. Only doing 3 days with her diarrhea. Reassess 2 weeks and may need to do longer course of increase.

## 2017-06-13 DIAGNOSIS — L97512 Non-pressure chronic ulcer of other part of right foot with fat layer exposed: Secondary | ICD-10-CM | POA: Diagnosis not present

## 2017-06-13 DIAGNOSIS — L97522 Non-pressure chronic ulcer of other part of left foot with fat layer exposed: Secondary | ICD-10-CM | POA: Diagnosis not present

## 2017-06-17 DIAGNOSIS — F4001 Agoraphobia with panic disorder: Secondary | ICD-10-CM | POA: Diagnosis not present

## 2017-06-20 ENCOUNTER — Ambulatory Visit (INDEPENDENT_AMBULATORY_CARE_PROVIDER_SITE_OTHER): Payer: Medicare Other | Admitting: Pharmacist

## 2017-06-20 DIAGNOSIS — D689 Coagulation defect, unspecified: Secondary | ICD-10-CM | POA: Diagnosis not present

## 2017-06-20 DIAGNOSIS — I5023 Acute on chronic systolic (congestive) heart failure: Secondary | ICD-10-CM | POA: Diagnosis not present

## 2017-06-20 DIAGNOSIS — I482 Chronic atrial fibrillation, unspecified: Secondary | ICD-10-CM

## 2017-06-20 LAB — PROTIME-INR: INR: 7.5 — AB (ref 0.9–1.1)

## 2017-06-24 ENCOUNTER — Ambulatory Visit (INDEPENDENT_AMBULATORY_CARE_PROVIDER_SITE_OTHER): Payer: Self-pay | Admitting: Internal Medicine

## 2017-06-24 DIAGNOSIS — I482 Chronic atrial fibrillation, unspecified: Secondary | ICD-10-CM

## 2017-06-24 DIAGNOSIS — I5041 Acute combined systolic (congestive) and diastolic (congestive) heart failure: Secondary | ICD-10-CM | POA: Diagnosis not present

## 2017-06-24 DIAGNOSIS — D689 Coagulation defect, unspecified: Secondary | ICD-10-CM | POA: Diagnosis not present

## 2017-06-24 LAB — PROTIME-INR: INR: 2.9 — AB (ref 0.9–1.1)

## 2017-06-29 ENCOUNTER — Other Ambulatory Visit: Payer: Self-pay | Admitting: Cardiology

## 2017-07-01 ENCOUNTER — Ambulatory Visit (INDEPENDENT_AMBULATORY_CARE_PROVIDER_SITE_OTHER): Payer: Self-pay | Admitting: Pharmacist

## 2017-07-01 DIAGNOSIS — I482 Chronic atrial fibrillation, unspecified: Secondary | ICD-10-CM

## 2017-07-01 DIAGNOSIS — I5032 Chronic diastolic (congestive) heart failure: Secondary | ICD-10-CM | POA: Diagnosis not present

## 2017-07-01 DIAGNOSIS — D689 Coagulation defect, unspecified: Secondary | ICD-10-CM | POA: Diagnosis not present

## 2017-07-01 LAB — PROTIME-INR: INR: 4 — AB (ref 0.9–1.1)

## 2017-07-08 ENCOUNTER — Ambulatory Visit (INDEPENDENT_AMBULATORY_CARE_PROVIDER_SITE_OTHER): Payer: Medicare Other | Admitting: Cardiovascular Disease

## 2017-07-08 ENCOUNTER — Other Ambulatory Visit: Payer: Self-pay | Admitting: Family Medicine

## 2017-07-08 DIAGNOSIS — Z7901 Long term (current) use of anticoagulants: Secondary | ICD-10-CM | POA: Diagnosis not present

## 2017-07-08 DIAGNOSIS — I482 Chronic atrial fibrillation, unspecified: Secondary | ICD-10-CM

## 2017-07-08 LAB — PROTIME-INR: INR: 4.4 — AB (ref 0.9–1.1)

## 2017-07-09 ENCOUNTER — Ambulatory Visit (INDEPENDENT_AMBULATORY_CARE_PROVIDER_SITE_OTHER): Payer: Medicare Other | Admitting: Family Medicine

## 2017-07-09 ENCOUNTER — Encounter: Payer: Self-pay | Admitting: Family Medicine

## 2017-07-09 DIAGNOSIS — I5032 Chronic diastolic (congestive) heart failure: Secondary | ICD-10-CM | POA: Diagnosis not present

## 2017-07-09 DIAGNOSIS — I25810 Atherosclerosis of coronary artery bypass graft(s) without angina pectoris: Secondary | ICD-10-CM

## 2017-07-09 DIAGNOSIS — R152 Fecal urgency: Secondary | ICD-10-CM | POA: Diagnosis not present

## 2017-07-09 DIAGNOSIS — R159 Full incontinence of feces: Secondary | ICD-10-CM

## 2017-07-09 NOTE — Progress Notes (Signed)
Subjective:  [name] is a 83 y.o. year old very pleasant female patient who presents for/with See problem oriented charting ROS- feels more winded than previously, does not want to do her exercise classes. Increased edema. Has had weight gain at home and stopped trusting the scale so stopped weighing 2 weeks ago  Past Medical History-  Patient Active Problem List   Diagnosis Date Noted  . Anemia, iron deficiency 05/15/2016    Priority: High  . Non-proliferative diabetic retinopathy, mild, left eye ([place]) 10/10/2015    Priority: High  . Atrial fibrillation ([place]) 01/08/2014    Priority: High  . Diastolic CHF, chronic ([place]) 01/02/2014    Priority: High  . Type II diabetes mellitus with ophthalmic manifestations ([place]) 01/02/2014    Priority: High  . Coronary artery disease     Priority: High  . Carotid artery stenosis 04/01/2012    Priority: High  . Depression 03/25/2015    Priority: Medium  . Spinal stenosis     Priority: Medium  . HTN (hypertension) 08/29/2011    Priority: Medium  . Hyperlipidemia 07/18/2011    Priority: Medium  . Allergic rhinitis 11/19/2015    Priority: Low  . Edema leg 05/26/2015    Priority: Low  . GERD (gastroesophageal reflux disease) 03/25/2015    Priority: Low  . Long term current use of anticoagulant therapy 01/08/2014    Priority: Low  . Lichen sclerosus     Priority: Low  . Fecal incontinence 07/09/2017    Medications- reviewed and updated Current Outpatient Prescriptions  Medication Sig Dispense Refill  . ALPRAZolam (XANAX) 1 MG tablet Take 1 mg by mouth daily as needed for anxiety.     . clobetasol cream (TEMOVATE) 0.05 % APPLY NIGHTLY AS NEEDED FOR IRRITATION 60 g 0  . furosemide (LASIX) 40 MG tablet Take 1 tablet (40 mg total) by mouth daily. 90 tablet 0  . loratadine (CLARITIN) 10 MG tablet Take 10 mg by mouth daily as needed for allergies.    [name] losartan (COZAAR) 25 MG tablet Take 1 tablet (25 mg total) by mouth daily. 30 tablet  12  . metFORMIN (GLUCOPHAGE) 500 MG tablet TAKE ONE TABLET BY MOUTH EVERY MORNING WITH BREAKFAST 90 tablet 3  . metoprolol succinate (TOPROL-XL) 25 MG 24 hr tablet TAKE 1 TABLET(S) BY MOUTH DAILY 30 tablet 11  . nitroGLYCERIN (NITROSTAT) 0.4 MG SL tablet Place 1 tablet (0.4 mg total) under the tongue every 5 (five) minutes as needed. For chest pain 25 tablet 2  . omeprazole (PRILOSEC) 20 MG capsule Take 1 capsule (20 mg total) by mouth daily. 90 capsule 3  . potassium chloride (K-DUR) 10 MEQ tablet Take 10 mEq by mouth daily.  90 tablet 3  . warfarin (COUMADIN) 5 MG tablet TAKE 1 TO 1.5 TABLETS BY MOUTH DAILY AS DIRECTED BY COUMADIN CLINIC 40 tablet 1   No current facility-administered medications for this visit.     Objective: BP 108/62 (BP Location: Left Arm, Patient Position: Sitting, Cuff Size: Large)   Pulse 98   Temp 97.9 F (36.6 C) (Oral)   Ht 5\' 8"  (1.727 m)   Wt 213 lb 12.8 oz (97 kg)   SpO2 98%   BMI 32.51 kg/m  Gen: NAD, resting comfortably Mucous membranes are moist. CV: RRR.  4/6 SEM.  Lungs: CTAB no crackles, wheeze, rhonchi Ext: 1+ edema Skin: warm, dry  Assessment/Plan:  Diastolic CHF, chronic (HCC) S: Weigh is up 11 lbs over the last month- had  planned on 2 week follow up after increasing lasix to 40mg  BID for 3 days. She was noncompliant with this. She also stopped taking the Lasix after the 3 days completely Wt Readings from Last 3 Encounters:  07/09/17 213 lb 12.8 oz (97 kg)  05/31/17 202 lb 12.8 oz (92 kg)  04/29/17 195 lb 6.4 oz (88.6 kg)  A/P:I'm concerned about worsening heart failure. Will increase Lasix to twice a day 40 mg for 3 days. Then resume once a day dosing. Follow-up next week. Also refer to care connection palliative care team through [facility] and hospice  Fecal incontinence S:Had one episode of incontinence before last visit followed by diarrhea at least every other day. The diarrhea has resolved at this point point with activa yougurt once a  day. She has had 2 more episodes over the last 6 weeks of sudden urgency to defecate thenfecal incontinence. Tries to hold a bowel movement in but simply cannot. A/P: We discussed the use of adult diapers. Imodium likely would not be helpful given his only one time occurrence. She asked about Pepto-Bismol which I also do not think will help. Some of this could be related to her long-term diabetes as well. Did not complete rectal exam. Offered gastrology referral which she declined. Hopeful care connections team may have some other advice  1 week.  Return precautions advised.  [name], MD

## 2017-07-09 NOTE — Patient Instructions (Signed)
Wednesday, Thursday, Friday- take 40mg  of lasix in the morning and gain in the afternoon  After that take 40mg  just in the morning  I would use dependz to help with the loose stools issue. You can try pepto bismol but I doubt it will help honestly.   We referred you to the palliative care - care connection team - they should be reaching out to you. I am hoping they may have other solutions for the loose stool issue as well as helping you with how to take medications to help with your heart failure.   You need to go back to weighing daily- if weight goes up with increased lasix I need to know immediatley- hoping to help it go back down by next week  Stop by check out and see me back in 1 week

## 2017-07-09 NOTE — Assessment & Plan Note (Signed)
S:Had one episode of incontinence before last visit followed by diarrhea at least every other day. The diarrhea has resolved at this point point with activa yougurt once a day. She has had 2 more episodes over the last 6 weeks of sudden urgency to defecate thenfecal incontinence. Tries to hold a bowel movement in but simply cannot. A/P: We discussed the use of adult diapers. Imodium likely would not be helpful given his only one time occurrence. She asked about Pepto-Bismol which I also do not think will help. Some of this could be related to her long-term diabetes as well. Did not complete rectal exam. Offered gastrology referral which she declined. Hopeful care connections team may have some other advice

## 2017-07-09 NOTE — Assessment & Plan Note (Signed)
S: Weigh is up 11 lbs over the last month- had planned on 2 week follow up after increasing lasix to 40mg  BID for 3 days. She was noncompliant with this. She also stopped taking the Lasix after the 3 days completely Wt Readings from Last 3 Encounters:  07/09/17 213 lb 12.8 oz (97 kg)  05/31/17 202 lb 12.8 oz (92 kg)  04/29/17 195 lb 6.4 oz (88.6 kg)  A/P:I'm concerned about worsening heart failure. Will increase Lasix to twice a day 40 mg for 3 days. Then resume once a day dosing. Follow-up next week. Also refer to care connection palliative care team through [facility] and hospice

## 2017-07-11 ENCOUNTER — Telehealth: Payer: Self-pay | Admitting: Family Medicine

## 2017-07-11 NOTE — Telephone Encounter (Signed)
Called and spoke with patient. She thinks she was dehydrated yesterday as she was restless last night. She did not sleep well. She has been drinking water today and states she has been feeling better. I told her not to change up her medication as we don't know if it was the lack of water or the medication that caused her to feel restless. She will call back tomorrow with an update for how she does tonight.

## 2017-07-11 NOTE — Telephone Encounter (Signed)
° ° °  Pt said she took the below med and thinks the medicine is keeping her awoke. She would like a call back    furosemide (LASIX) 40 MG tablet   [phone]

## 2017-07-12 DIAGNOSIS — L03032 Cellulitis of left toe: Secondary | ICD-10-CM | POA: Diagnosis not present

## 2017-07-12 DIAGNOSIS — E11621 Type 2 diabetes mellitus with foot ulcer: Secondary | ICD-10-CM | POA: Diagnosis not present

## 2017-07-12 DIAGNOSIS — L84 Corns and callosities: Secondary | ICD-10-CM | POA: Diagnosis not present

## 2017-07-12 DIAGNOSIS — E1159 Type 2 diabetes mellitus with other circulatory complications: Secondary | ICD-10-CM | POA: Diagnosis not present

## 2017-07-12 DIAGNOSIS — L89892 Pressure ulcer of other site, stage 2: Secondary | ICD-10-CM | POA: Diagnosis not present

## 2017-07-12 DIAGNOSIS — B351 Tinea unguium: Secondary | ICD-10-CM | POA: Diagnosis not present

## 2017-07-12 DIAGNOSIS — L89893 Pressure ulcer of other site, stage 3: Secondary | ICD-10-CM | POA: Diagnosis not present
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
## 2017-07-15 ENCOUNTER — Ambulatory Visit (INDEPENDENT_AMBULATORY_CARE_PROVIDER_SITE_OTHER): Payer: Medicare Other | Admitting: Cardiology

## 2017-07-15 ENCOUNTER — Encounter: Payer: Self-pay | Admitting: Cardiology

## 2017-07-15 VITALS — BP 120/68 | HR 63 | Ht 68.0 in | Wt 210.6 lb

## 2017-07-15 DIAGNOSIS — I482 Chronic atrial fibrillation, unspecified: Secondary | ICD-10-CM

## 2017-07-15 DIAGNOSIS — I5032 Chronic diastolic (congestive) heart failure: Secondary | ICD-10-CM | POA: Diagnosis not present

## 2017-07-15 DIAGNOSIS — I25810 Atherosclerosis of coronary artery bypass graft(s) without angina pectoris: Secondary | ICD-10-CM

## 2017-07-15 DIAGNOSIS — I10 Essential (primary) hypertension: Secondary | ICD-10-CM

## 2017-07-15 MED ORDER — FUROSEMIDE 40 MG PO TABS: 40.0000 mg | ORAL_TABLET | Freq: Two times a day (BID) | ORAL | 3 refills | Status: DC

## 2017-07-15 NOTE — Patient Instructions (Signed)
Increase lasix to 40 mg twice a day  We will arrange follow up in our office in one month.

## 2017-07-17 ENCOUNTER — Telehealth: Payer: Self-pay | Admitting: Cardiology

## 2017-07-17 NOTE — Telephone Encounter (Signed)
Called patient - she states she has not been taking metoprolol succinate or losartan for months. She states she was just taking so much medicine already. VS noted below. Advised to discuss w/PCP Dr. Yong Channel at her appt tomorrow as she will need advice on resuming her medications and at what dose. Advised if she resumed metoprolol succinate and losartan at current doses, this would likely decrease her BP too much. Advised she needs to be up front with PCP regarding med compliance so that she can be directed appropriately on med changes.   Message sent to Dr. [name] & PCP

## 2017-07-17 NOTE — Telephone Encounter (Signed)
Tammy( [facility]) is calling because [name] has not been taking her Losartan and her Metoprolol in months . She has not been using her scale correctly and haven't been weighing herself. She has edema up to her thighs . Her Weight this morning was 211.5, Her BP is 104/74 pulse 80 and Respiratory is 22. Just wanted you to be aware . If you have any questions or need to speak with her please call at [phone]

## 2017-07-18 ENCOUNTER — Ambulatory Visit (INDEPENDENT_AMBULATORY_CARE_PROVIDER_SITE_OTHER): Payer: Medicare Other | Admitting: Cardiology

## 2017-07-18 ENCOUNTER — Encounter: Payer: Self-pay | Admitting: Family Medicine

## 2017-07-18 ENCOUNTER — Ambulatory Visit (INDEPENDENT_AMBULATORY_CARE_PROVIDER_SITE_OTHER): Payer: Medicare Other | Admitting: Family Medicine

## 2017-07-18 VITALS — BP 108/88 | HR 93 | Ht 68.0 in | Wt 211.0 lb

## 2017-07-18 DIAGNOSIS — I5032 Chronic diastolic (congestive) heart failure: Secondary | ICD-10-CM

## 2017-07-18 DIAGNOSIS — I10 Essential (primary) hypertension: Secondary | ICD-10-CM | POA: Diagnosis not present

## 2017-07-18 DIAGNOSIS — D509 Iron deficiency anemia, unspecified: Secondary | ICD-10-CM

## 2017-07-18 DIAGNOSIS — I25810 Atherosclerosis of coronary artery bypass graft(s) without angina pectoris: Secondary | ICD-10-CM

## 2017-07-18 DIAGNOSIS — I2510 Atherosclerotic heart disease of native coronary artery without angina pectoris: Secondary | ICD-10-CM

## 2017-07-18 DIAGNOSIS — D649 Anemia, unspecified: Secondary | ICD-10-CM

## 2017-07-18 DIAGNOSIS — E1139 Type 2 diabetes mellitus with other diabetic ophthalmic complication: Secondary | ICD-10-CM

## 2017-07-18 DIAGNOSIS — Z79899 Other long term (current) drug therapy: Secondary | ICD-10-CM | POA: Diagnosis not present

## 2017-07-18 DIAGNOSIS — I482 Chronic atrial fibrillation, unspecified: Secondary | ICD-10-CM

## 2017-07-18 DIAGNOSIS — D689 Coagulation defect, unspecified: Secondary | ICD-10-CM | POA: Diagnosis not present

## 2017-07-18 LAB — PROTIME-INR: INR: 4.4 — AB (ref 0.9–1.1)

## 2017-07-18 MED ORDER — NITROGLYCERIN 0.4 MG SL SUBL: 0.4000 mg | SUBLINGUAL_TABLET | SUBLINGUAL | 2 refills | Status: AC | PRN

## 2017-07-18 NOTE — Assessment & Plan Note (Signed)
S: controlled on lasix 40mg  BID alone. She stopped taking metoprolol 25mg  XR and losartan 25mg  on her own BP Readings from Last 3 Encounters:  07/18/17 108/88  07/15/17 120/68  07/09/17 108/62  A/P: We discussed blood pressure goal of <140/90. Continue lasix 40mg  BID. Restart metoprolol XR 25mg  tablet but only at half tablet (written in) and follow up next week- rate noted at 93 today.

## 2017-07-18 NOTE — Assessment & Plan Note (Signed)
avm on colonoscopy as likely cause. Has not tolerated oral iron. With fatigue and SOB- update ferritin and CBC

## 2017-07-18 NOTE — Assessment & Plan Note (Signed)
S: last visit increased lasix to twice a day 40mg  for 3 days then was to continue 40mg  daily (prior had done this increase but she reported stopping lasix completely after the 3 days) . Cardiology saw her and encouraged her to continue twice a day. I had Referred to care connection who has been following up on compliance. [facility]- found out she was not taking metoprolol or losartan as she thought she was taking "too many medications".   Today weight is down 2 lbs from last visit on BID lasix. She feels fatigued on this dose and continues to feel short of breath.  A/P: continue lasix 40mg  BID potentially long term.   we had a very long discussion today about importance of compliance- she previously told me she had not been taking lasix 40mg  after a prior 3 day boost. She expects immediate relief and has been sad that short burts of lasix have not resolved issue. Due to compliance concerns encouraged follow up next week.   Could also have fatigue from low iron- updating cbc and ferritin

## 2017-07-18 NOTE — Progress Notes (Signed)
Subjective:  [name] is a 83 y.o. year old very pleasant female patient who presents for/with See problem oriented charting ROS- patient continues to feel fatigued, has swelling, has shortness of breath walking into kitchen athome. No chest pain.    Past Medical History-  Patient Active Problem List   Diagnosis Date Noted  . Anemia, iron deficiency 05/15/2016    Priority: High  . Non-proliferative diabetic retinopathy, mild, left eye ([place]) 10/10/2015    Priority: High  . Atrial fibrillation ([place]) 01/08/2014    Priority: High  . Diastolic CHF, chronic ([place]) 01/02/2014    Priority: High  . Type II diabetes mellitus with ophthalmic manifestations ([place]) 01/02/2014    Priority: High  . Coronary artery disease     Priority: High  . Carotid artery stenosis 04/01/2012    Priority: High  . Depression 03/25/2015    Priority: Medium  . Spinal stenosis     Priority: Medium  . HTN (hypertension) 08/29/2011    Priority: Medium  . Hyperlipidemia 07/18/2011    Priority: Medium  . Allergic rhinitis 11/19/2015    Priority: Low  . GERD (gastroesophageal reflux disease) 03/25/2015    Priority: Low  . Long term current use of anticoagulant therapy 01/08/2014    Priority: Low  . Lichen sclerosus     Priority: Low  . Fecal incontinence 07/09/2017    Medications- reviewed and updated Current Outpatient Prescriptions  Medication Sig Dispense Refill  . furosemide (LASIX) 40 MG tablet Take 1 tablet (40 mg total) by mouth 2 (two) times daily. 180 tablet 3  . loratadine (CLARITIN) 10 MG tablet Take 10 mg by mouth daily as needed for allergies.    . metFORMIN (GLUCOPHAGE) 500 MG tablet TAKE ONE TABLET BY MOUTH EVERY MORNING WITH BREAKFAST 90 tablet 3  . metoprolol succinate (TOPROL-XL) 25 MG 24 hr tablet TAKE 1 TABLET(S) BY MOUTH DAILY 30 tablet 11  . nitroGLYCERIN (NITROSTAT) 0.4 MG SL tablet Place 1 tablet (0.4 mg total) under the tongue every 5 (five) minutes as needed. For chest pain 25  tablet 2  . omeprazole (PRILOSEC) 20 MG capsule Take 1 capsule (20 mg total) by mouth daily. 90 capsule 3  . potassium chloride (K-DUR) 10 MEQ tablet Take 10 mEq by mouth daily.  90 tablet 3  . warfarin (COUMADIN) 5 MG tablet TAKE 1 TO 1.5 TABLETS BY MOUTH DAILY AS DIRECTED BY COUMADIN CLINIC 40 tablet 1   No current facility-administered medications for this visit.     Objective: BP 108/88 (BP Location: Left Arm, Patient Position: Sitting, Cuff Size: Large)   Pulse 93   Ht 5\' 8"  (1.727 m)   Wt 211 lb (95.7 kg)   SpO2 94%   BMI 32.08 kg/m  Gen: NAD, resting comfortably CV: RRR no murmurs rubs or gallops Lungs: CTAB no crackles, wheeze, rhonchi Abdomen: soft/nontender/nondistended/normal bowel sounds. No rebound or guarding.  Ext:  1-2+ edema bilaterally, light compression stockings noted at ankles Skin: warm, dry  Assessment/Plan:  Diastolic CHF, chronic (HCC) S: last visit increased lasix to twice a day 40mg  for 3 days then was to continue 40mg  daily (prior had done this increase but she reported stopping lasix completely after the 3 days) . Cardiology saw her and encouraged her to continue twice a day. I had Referred to [facility] who has been following up on compliance. [facility]- found out she was not taking metoprolol or losartan as she thought she was taking "too many medications".  Today weight is down 2 lbs from last visit on BID lasix. She feels fatigued on this dose and continues to feel short of breath.  A/P: continue lasix 40mg  BID potentially long term.   we had a very long discussion today about importance of compliance- she previously told me she had not been taking lasix 40mg  after a prior 3 day boost. She expects immediate relief and has been sad that short burts of lasix have not resolved issue. Due to compliance concerns encouraged follow up next week.   Could also have fatigue from low iron- updating cbc and ferritin  HTN (hypertension) S:  controlled on lasix 40mg  BID alone. She stopped taking metoprolol 25mg  XR and losartan 25mg  on her own BP Readings from Last 3 Encounters:  07/18/17 108/88  07/15/17 120/68  07/09/17 108/62  A/P: We discussed blood pressure goal of <140/90. Continue lasix 40mg  BID. Restart metoprolol XR 25mg  tablet but only at half tablet (written in) and follow up next week- rate noted at 93 today.    Type II diabetes mellitus with ophthalmic manifestations ([place]) S: previously controlled. On metformin 500mg  daily Lab Results  Component Value Date   HGBA1C 6.3 03/22/2017   HGBA1C 6.2 09/18/2016   HGBA1C 7.1 (H) 11/18/2015   A/P: drawing labs and will use opportunity to update a1c today. Hopeful at goal.    Anemia, iron deficiency avm on colonoscopy as likely cause. Has not tolerated oral iron. With fatigue and SOB- update ferritin and CBC  1 week  Orders Placed This Encounter  Procedures  . Basic metabolic panel    [place]  . Hemoglobin A1c    [place]  . CBC    [place]  . Ferritin    Meds ordered this encounter  Medications  . nitroGLYCERIN (NITROSTAT) 0.4 MG SL tablet    Sig: Place 1 tablet (0.4 mg total) under the tongue every 5 (five) minutes as needed. For chest pain    Dispense:  25 tablet    Refill:  2    Return precautions advised.  [name], MD

## 2017-07-18 NOTE — Patient Instructions (Addendum)
STOP losartan permanently  Start metoprolol 25mg  XL   Continue 40mg  of lasix in the morning and 40mg  about 6-7 hours later (twice a day)  Please stop by lab before you go

## 2017-07-18 NOTE — Assessment & Plan Note (Signed)
S: previously controlled. On metformin 500mg  daily Lab Results  Component Value Date   HGBA1C 6.3 03/22/2017   HGBA1C 6.2 09/18/2016   HGBA1C 7.1 (H) 11/18/2015   A/P: drawing labs and will use opportunity to update a1c today. Hopeful at goal.

## 2017-07-19 LAB — CBC
HEMATOCRIT: 37.7 % (ref 36.0–46.0)
HEMOGLOBIN: 11.8 g/dL — AB (ref 12.0–15.0)
MCHC: 31.4 g/dL (ref 30.0–36.0)
MCV: 78.8 fl (ref 78.0–100.0)
Platelets: 259 10*3/uL (ref 150.0–400.0)
RBC: 4.78 Mil/uL (ref 3.87–5.11)
RDW: 19.3 % — ABNORMAL HIGH (ref 11.5–15.5)
WBC: 7.8 10*3/uL (ref 4.0–10.5)

## 2017-07-19 LAB — BASIC METABOLIC PANEL
BUN: 27 mg/dL — ABNORMAL HIGH (ref 6–23)
CHLORIDE: 95 meq/L — AB (ref 96–112)
CO2: 26 meq/L (ref 19–32)
Calcium: 9.3 mg/dL (ref 8.4–10.5)
Creatinine, Ser: 1.04 mg/dL (ref 0.40–1.20)
GFR: 53.92 mL/min — ABNORMAL LOW (ref >60.00)
GLUCOSE: 91 mg/dL (ref 70–99)
Potassium: 4.2 mEq/L (ref 3.5–5.1)
SODIUM: 132 meq/L — AB (ref 135–145)

## 2017-07-19 LAB — HEMOGLOBIN A1C: Hgb A1c MFr Bld: 6 % (ref 4.6–6.5)

## 2017-07-19 LAB — FERRITIN: Ferritin: 34.1 ng/mL (ref 10.0–291.0)

## 2017-07-25 ENCOUNTER — Encounter: Payer: Self-pay | Admitting: Family Medicine

## 2017-07-25 ENCOUNTER — Ambulatory Visit (INDEPENDENT_AMBULATORY_CARE_PROVIDER_SITE_OTHER): Payer: Medicare Other | Admitting: Family Medicine

## 2017-07-25 DIAGNOSIS — I10 Essential (primary) hypertension: Secondary | ICD-10-CM

## 2017-07-25 DIAGNOSIS — I25810 Atherosclerosis of coronary artery bypass graft(s) without angina pectoris: Secondary | ICD-10-CM | POA: Diagnosis not present

## 2017-07-25 DIAGNOSIS — I5032 Chronic diastolic (congestive) heart failure: Secondary | ICD-10-CM

## 2017-07-25 MED ORDER — CLOBETASOL PROPIONATE 0.05 % EX CREA: TOPICAL_CREAM | CUTANEOUS | 0 refills | Status: DC

## 2017-07-25 MED ORDER — FUROSEMIDE 40 MG PO TABS: 40.0000 mg | ORAL_TABLET | Freq: Two times a day (BID) | ORAL | 3 refills | Status: DC

## 2017-07-25 NOTE — Progress Notes (Signed)
Subjective:  [name] is a 83 y.o. year old very pleasant female patient who presents for/with See problem oriented charting ROS- continued edema, shortness of breath has improved. No chest pain. No worsening of vision.    Past Medical History-  Patient Active Problem List   Diagnosis Date Noted  . Anemia, iron deficiency 05/15/2016    Priority: High  . Non-proliferative diabetic retinopathy, mild, left eye ([place]) 10/10/2015    Priority: High  . Atrial fibrillation ([place]) 01/08/2014    Priority: High  . Diastolic CHF, chronic ([place]) 01/02/2014    Priority: High  . Type II diabetes mellitus with ophthalmic manifestations ([place]) 01/02/2014    Priority: High  . Coronary artery disease     Priority: High  . Carotid artery stenosis 04/01/2012    Priority: High  . Depression 03/25/2015    Priority: Medium  . Spinal stenosis     Priority: Medium  . HTN (hypertension) 08/29/2011    Priority: Medium  . Hyperlipidemia 07/18/2011    Priority: Medium  . Allergic rhinitis 11/19/2015    Priority: Low  . GERD (gastroesophageal reflux disease) 03/25/2015    Priority: Low  . Long term current use of anticoagulant therapy 01/08/2014    Priority: Low  . Lichen sclerosus     Priority: Low  . Fecal incontinence 07/09/2017    Medications- reviewed and updated Current Outpatient Prescriptions  Medication Sig Dispense Refill  . furosemide (LASIX) 40 MG tablet Take 1 tablet (40 mg total) by mouth 2 (two) times daily. 180 tablet 3  . loratadine (CLARITIN) 10 MG tablet Take 10 mg by mouth daily as needed for allergies.    . metFORMIN (GLUCOPHAGE) 500 MG tablet TAKE ONE TABLET BY MOUTH EVERY MORNING WITH BREAKFAST 90 tablet 3  . metoprolol succinate (TOPROL-XL) 25 MG 24 hr tablet  Not taking yet TAKE 1 TABLET(S) BY MOUTH DAILY 30 tablet 11  . nitroGLYCERIN (NITROSTAT) 0.4 MG SL tablet Place 1 tablet (0.4 mg total) under the tongue every 5 (five) minutes as needed. For chest pain 25 tablet 2   . omeprazole (PRILOSEC) 20 MG capsule Take 1 capsule (20 mg total) by mouth daily. 90 capsule 3  . potassium chloride (K-DUR) 10 MEQ tablet Take 10 mEq by mouth daily.  90 tablet 3  . warfarin (COUMADIN) 5 MG tablet TAKE 1 TO 1.5 TABLETS BY MOUTH DAILY AS DIRECTED BY COUMADIN CLINIC 40 tablet 1   No current facility-administered medications for this visit.     Objective: BP 98/68 (BP Location: Left Arm, Patient Position: Sitting, Cuff Size: Large)   Pulse 88   Temp (!) 97.5 F (36.4 C) (Oral)   Ht 5\' 8"  (1.727 m)   Wt 210 lb (95.3 kg)   SpO2 92%   BMI 31.93 kg/m  Gen: NAD, resting comfortably CV: 3/6 systolic ejection murmur. Irregularly irregular Lungs: CTAB no crackles, wheeze, rhonchi Abdomen: soft/nontender/nondistended/normal bowel sounds. No rebound or guarding.  Ext: continued 1-2+ edema but not wearing compression stockings today skin: warm, dry  Assessment/Plan:  Diastolic CHF, chronic (HCC) S: weight down another lb in the last week on lasix 40mg  BID (suspected new long term dose). States not having shortness of breath as long as taking her medication.  Wt Readings from Last 3 Encounters:  07/25/17 210 lb (95.3 kg)  07/18/17 211 lb (95.7 kg)  A/P: Patient is discouraged by having to take medication regularly and not having rapid weight loss or edema improvement. Discussed with her to  continue this slow steady process. Will check in 2 weeks from now to make sure she is continuing her efforts. Will get bmet at that time to check back in on sodium which was slightly low as well as potassium and creatinine. Sent in 40mg  tablets in hopes that 1 pill twice a day bothers her less than 2 pills twice a day (mentally). Discussed she could follow up with cardiology in 1 week with me out of town but she opts out  HTN (hypertension) S: controlled on lasix 40mg  BID alone- and running on low side- has not started metoprolol yet.  BP Readings from Last 3 Encounters:  07/25/17 98/68   07/18/17 108/88  07/15/17 120/68  A/P: We discussed blood pressure goal of <140/90. Continue current meds:  And hold off on restarting metoprolol until follow up potentially.    2 weeks. Gave her a refill on cream steroid- uses on hands at night as sometimes wakes up itching.   Meds ordered this encounter  Medications  . clobetasol cream (TEMOVATE) 0.05 %    Sig: APPLY NIGHTLY AS NEEDED FOR IRRITATION. 7 days in a row maximum    Dispense:  60 g    Refill:  0  . furosemide (LASIX) 40 MG tablet    Sig: Take 1 tablet (40 mg total) by mouth 2 (two) times daily.    Dispense:  180 tablet    Refill:  3    Return precautions advised.  [name], MD

## 2017-07-25 NOTE — Patient Instructions (Addendum)
Continue lasix 40mg  twice a day. I sent in 40mg  tablets so you only have to take 1 pill twice a day.   Sent in cream for you as well for hand. 7 days maximum in a row  Schedule a visit for 2 weeks before you leave

## 2017-07-25 NOTE — Assessment & Plan Note (Signed)
S: weight down another lb in the last week on lasix 40mg  BID (suspected new long term dose). States not having shortness of breath as long as taking her medication.  Wt Readings from Last 3 Encounters:  07/25/17 210 lb (95.3 kg)  07/18/17 211 lb (95.7 kg)  A/P: Patient is discouraged by having to take medication regularly and not having rapid weight loss or edema improvement. Discussed with her to continue this slow steady process. Will check in 2 weeks from now to make sure she is continuing her efforts. Will get bmet at that time to check back in on sodium which was slightly low as well as potassium and creatinine. Sent in 40mg  tablets in hopes that 1 pill twice a day bothers her less than 2 pills twice a day (mentally). Discussed she could follow up with cardiology in 1 week with me out of town but she opts out

## 2017-07-25 NOTE — Assessment & Plan Note (Signed)
S: controlled on lasix 40mg  BID alone- and running on low side- has not started metoprolol yet.  BP Readings from Last 3 Encounters:  07/25/17 98/68  07/18/17 108/88  07/15/17 120/68  A/P: We discussed blood pressure goal of <140/90. Continue current meds:  And hold off on restarting metoprolol until follow up potentially.

## 2017-07-29 ENCOUNTER — Ambulatory Visit (INDEPENDENT_AMBULATORY_CARE_PROVIDER_SITE_OTHER): Payer: Self-pay | Admitting: Internal Medicine

## 2017-07-29 DIAGNOSIS — I480 Paroxysmal atrial fibrillation: Secondary | ICD-10-CM | POA: Diagnosis not present

## 2017-07-29 DIAGNOSIS — Z7901 Long term (current) use of anticoagulants: Secondary | ICD-10-CM | POA: Diagnosis not present

## 2017-07-29 DIAGNOSIS — I482 Chronic atrial fibrillation, unspecified: Secondary | ICD-10-CM

## 2017-07-29 LAB — PROTIME-INR: INR: 3.3 — AB (ref 0.9–1.1)

## 2017-08-08 ENCOUNTER — Encounter: Payer: Self-pay | Admitting: Family Medicine

## 2017-08-08 ENCOUNTER — Ambulatory Visit (INDEPENDENT_AMBULATORY_CARE_PROVIDER_SITE_OTHER): Payer: Medicare Other | Admitting: Family Medicine

## 2017-08-08 DIAGNOSIS — I25810 Atherosclerosis of coronary artery bypass graft(s) without angina pectoris: Secondary | ICD-10-CM | POA: Diagnosis not present

## 2017-08-08 DIAGNOSIS — I5032 Chronic diastolic (congestive) heart failure: Secondary | ICD-10-CM

## 2017-08-08 DIAGNOSIS — I10 Essential (primary) hypertension: Secondary | ICD-10-CM | POA: Diagnosis not present

## 2017-08-08 NOTE — Progress Notes (Signed)
Subjective:  [name] is a 83 y.o. year old very pleasant female patient who presents for/with See problem oriented charting ROS- can walk to car without any issues. No chest pain. Using walker. Edema improved. Fatigue improved.    Past Medical History-  Patient Active Problem List   Diagnosis Date Noted  . Anemia, iron deficiency 05/15/2016    Priority: High  . Non-proliferative diabetic retinopathy, mild, left eye ([place]) 10/10/2015    Priority: High  . Atrial fibrillation ([place]) 01/08/2014    Priority: High  . Diastolic CHF, chronic ([place]) 01/02/2014    Priority: High  . Type II diabetes mellitus with ophthalmic manifestations ([place]) 01/02/2014    Priority: High  . Coronary artery disease     Priority: High  . Carotid artery stenosis 04/01/2012    Priority: High  . Depression 03/25/2015    Priority: Medium  . Spinal stenosis     Priority: Medium  . HTN (hypertension) 08/29/2011    Priority: Medium  . Hyperlipidemia 07/18/2011    Priority: Medium  . Allergic rhinitis 11/19/2015    Priority: Low  . GERD (gastroesophageal reflux disease) 03/25/2015    Priority: Low  . Long term current use of anticoagulant therapy 01/08/2014    Priority: Low  . Lichen sclerosus     Priority: Low  . Fecal incontinence 07/09/2017    Medications- reviewed and updated Current Outpatient Prescriptions  Medication Sig Dispense Refill  . clobetasol cream (TEMOVATE) 0.05 % APPLY NIGHTLY AS NEEDED FOR IRRITATION. 7 days in a row maximum 60 g 0  . furosemide (LASIX) 40 MG tablet Take 1 tablet (40 mg total) by mouth 2 (two) times daily. 180 tablet 3  . loratadine (CLARITIN) 10 MG tablet Take 10 mg by mouth daily as needed for allergies.    . metFORMIN (GLUCOPHAGE) 500 MG tablet TAKE ONE TABLET BY MOUTH EVERY MORNING WITH BREAKFAST 90 tablet 3  . metoprolol succinate (TOPROL-XL) 25 MG 24 hr tablet. Not taking yet.  TAKE 1 TABLET(S) BY MOUTH DAILY 30 tablet 11  . nitroGLYCERIN (NITROSTAT) 0.4 MG  SL tablet Place 1 tablet (0.4 mg total) under the tongue every 5 (five) minutes as needed. For chest pain 25 tablet 2  . omeprazole (PRILOSEC) 20 MG capsule Take 1 capsule (20 mg total) by mouth daily. 90 capsule 3  . potassium chloride (K-DUR) 10 MEQ tablet Take 10 mEq by mouth daily.  90 tablet 3  . warfarin (COUMADIN) 5 MG tablet TAKE 1 TO 1.5 TABLETS BY MOUTH DAILY AS DIRECTED BY COUMADIN CLINIC 40 tablet 1   No current facility-administered medications for this visit.     Objective: BP 102/74 (BP Location: Left Arm, Patient Position: Sitting, Cuff Size: Large)   Pulse 78   Ht 5\' 8"  (1.727 m)   Wt 206 lb 6.4 oz (93.6 kg)   SpO2 95%   BMI 31.38 kg/m  Gen: NAD, resting comfortably CV: RRR no murmurs rubs or gallops Lungs: CTAB no crackles, wheeze, rhonchi Abdomen: soft/nontender/nondistended/normal bowel sounds. No rebound or guarding.  Ext: 1+ edema without compression stockings (improved Skin: warm, dry  Assessment/Plan:  Diastolic CHF, chronic (HCC) S: weight down 4 lbs on twice a day lasix 40mg  . Swelling improved. She is finally feeling better and encouraged by progress Wt Readings from Last 3 Encounters:  08/08/17 206 lb 6.4 oz (93.6 kg)  07/25/17 210 lb (95.3 kg)  07/18/17 211 lb (95.7 kg)  A/P: update bmet today to check on potassium and  creatinine (sodium also was slightly low). Continue lasix 40mg  BID  HTN (hypertension) S: controlled on lasix 40mg  BID alone. Not on metoprolol as result.  BP Readings from Last 3 Encounters:  08/08/17 102/74  07/25/17 98/68  07/18/17 108/88  A/P: We discussed blood pressure goal of <140/90. Continue current meds:  Needs higher lasix dose to avoid heart failure so will hold off on metoprolol unless BP starts to run higher or HR runs higher.   6 weeks. Hopeful may be able to start metoprolol at that visit though HR controlled for a fib  Orders Placed This Encounter  Procedures  . Basic metabolic panel    [place]   Return  precautions advised.  [name], MD

## 2017-08-08 NOTE — Assessment & Plan Note (Signed)
S: controlled on lasix 40mg  BID alone. Not on metoprolol as result.  BP Readings from Last 3 Encounters:  08/08/17 102/74  07/25/17 98/68  07/18/17 108/88  A/P: We discussed blood pressure goal of <140/90. Continue current meds:  Needs higher lasix dose to avoid heart failure so will hold off on metoprolol unless BP starts to run higher or HR runs higher.

## 2017-08-08 NOTE — Assessment & Plan Note (Signed)
S: weight down 4 lbs on twice a day lasix 40mg  . Swelling improved. She is finally feeling better and encouraged by progress Wt Readings from Last 3 Encounters:  08/08/17 206 lb 6.4 oz (93.6 kg)  07/25/17 210 lb (95.3 kg)  07/18/17 211 lb (95.7 kg)  A/P: update bmet today to check on potassium and creatinine (sodium also was slightly low). Continue lasix 40mg  BID

## 2017-08-08 NOTE — Patient Instructions (Signed)
Please stop by lab before you go  Continue lasix 40mg  twice a day. You are doing so fantastic! Weight down 4 lbs

## 2017-08-09 DIAGNOSIS — E1159 Type 2 diabetes mellitus with other circulatory complications: Secondary | ICD-10-CM | POA: Diagnosis not present

## 2017-08-09 DIAGNOSIS — E11621 Type 2 diabetes mellitus with foot ulcer: Secondary | ICD-10-CM | POA: Diagnosis not present

## 2017-08-09 DIAGNOSIS — L89892 Pressure ulcer of other site, stage 2: Secondary | ICD-10-CM | POA: Diagnosis not present

## 2017-08-09 LAB — BASIC METABOLIC PANEL
BUN: 32 mg/dL — ABNORMAL HIGH (ref 6–23)
CHLORIDE: 96 meq/L (ref 96–112)
CO2: 27 meq/L (ref 19–32)
Calcium: 9.1 mg/dL (ref 8.4–10.5)
Creatinine, Ser: 0.98 mg/dL (ref 0.40–1.20)
GFR: 57.74 mL/min — ABNORMAL LOW (ref >60.00)
Glucose, Bld: 88 mg/dL (ref 70–99)
POTASSIUM: 3.8 meq/L (ref 3.5–5.1)
SODIUM: 134 meq/L — AB (ref 135–145)

## 2017-08-12 ENCOUNTER — Ambulatory Visit (INDEPENDENT_AMBULATORY_CARE_PROVIDER_SITE_OTHER): Payer: Medicare Other | Admitting: Interventional Cardiology

## 2017-08-12 DIAGNOSIS — I2510 Atherosclerotic heart disease of native coronary artery without angina pectoris: Secondary | ICD-10-CM | POA: Diagnosis not present

## 2017-08-12 DIAGNOSIS — I482 Chronic atrial fibrillation, unspecified: Secondary | ICD-10-CM

## 2017-08-12 DIAGNOSIS — I4891 Unspecified atrial fibrillation: Secondary | ICD-10-CM | POA: Diagnosis not present

## 2017-08-12 LAB — PROTIME-INR: INR: 2.7 — AB (ref 0.9–1.1)

## 2017-08-15 ENCOUNTER — Ambulatory Visit
Admission: RE | Admit: 2017-08-15 | Discharge: 2017-08-15 | Disposition: A | Discharge status: Home / Self Care | Payer: Medicare Other | Source: Ambulatory Visit | Attending: Physician Assistant | Admitting: Physician Assistant

## 2017-08-15 ENCOUNTER — Encounter: Payer: Self-pay | Admitting: Physician Assistant

## 2017-08-15 ENCOUNTER — Ambulatory Visit (INDEPENDENT_AMBULATORY_CARE_PROVIDER_SITE_OTHER): Payer: Medicare Other | Admitting: Physician Assistant

## 2017-08-15 VITALS — BP 95/69 | HR 73 | Ht 68.0 in | Wt 209.6 lb

## 2017-08-15 DIAGNOSIS — I5033 Acute on chronic diastolic (congestive) heart failure: Secondary | ICD-10-CM

## 2017-08-15 DIAGNOSIS — I6523 Occlusion and stenosis of bilateral carotid arteries: Secondary | ICD-10-CM | POA: Diagnosis not present

## 2017-08-15 DIAGNOSIS — E119 Type 2 diabetes mellitus without complications: Secondary | ICD-10-CM

## 2017-08-15 DIAGNOSIS — E785 Hyperlipidemia, unspecified: Secondary | ICD-10-CM

## 2017-08-15 DIAGNOSIS — I482 Chronic atrial fibrillation, unspecified: Secondary | ICD-10-CM

## 2017-08-15 DIAGNOSIS — Z79899 Other long term (current) drug therapy: Secondary | ICD-10-CM

## 2017-08-15 DIAGNOSIS — I10 Essential (primary) hypertension: Secondary | ICD-10-CM | POA: Diagnosis not present

## 2017-08-15 DIAGNOSIS — I509 Heart failure, unspecified: Secondary | ICD-10-CM | POA: Diagnosis not present

## 2017-08-15 DIAGNOSIS — I25810 Atherosclerosis of coronary artery bypass graft(s) without angina pectoris: Secondary | ICD-10-CM

## 2017-08-15 DIAGNOSIS — I5032 Chronic diastolic (congestive) heart failure: Secondary | ICD-10-CM

## 2017-08-15 MED ORDER — POTASSIUM CHLORIDE ER 10 MEQ PO TBCR: 20.0000 meq | EXTENDED_RELEASE_TABLET | ORAL | 3 refills | Status: DC

## 2017-08-15 MED ORDER — FUROSEMIDE 40 MG PO TABS: 40.0000 mg | ORAL_TABLET | ORAL | 3 refills | Status: DC
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
## 2017-08-15 NOTE — Patient Instructions (Signed)
Medication Instructions:  INCREASE LASIX TO 80MG  IN THE AM AND 40MG  IN THE PM INCREASE POTASSIUM TO 2 (20MG )DAILY  If you need a refill on your cardiac medications before your next appointment, please call your pharmacy.  Labwork: BMP IN 5 DAYS (08-20-17) HERE IN OUR OFFICE AT [facility]  Testing/Procedures: CHEST XRAY TODAY AT: [address] [place], [place] [address]   Follow-Up: Your physician wants you to follow-up On:  Thursday 08-29-2017 AT 1130AM   Thank you for choosing [facility] at [facility]!!

## 2017-08-17 ENCOUNTER — Encounter: Payer: Self-pay | Admitting: Physician Assistant

## 2017-08-22 ENCOUNTER — Encounter: Payer: Self-pay | Admitting: Cardiology

## 2017-08-23 ENCOUNTER — Encounter: Payer: Self-pay | Admitting: Pharmacist

## 2017-08-26 ENCOUNTER — Ambulatory Visit (INDEPENDENT_AMBULATORY_CARE_PROVIDER_SITE_OTHER): Payer: Medicare Other | Admitting: Cardiovascular Disease

## 2017-08-26 DIAGNOSIS — I482 Chronic atrial fibrillation, unspecified: Secondary | ICD-10-CM

## 2017-08-26 DIAGNOSIS — I110 Hypertensive heart disease with heart failure: Secondary | ICD-10-CM | POA: Diagnosis not present

## 2017-08-26 DIAGNOSIS — I4891 Unspecified atrial fibrillation: Secondary | ICD-10-CM | POA: Diagnosis not present

## 2017-08-26 DIAGNOSIS — I2510 Atherosclerotic heart disease of native coronary artery without angina pectoris: Secondary | ICD-10-CM | POA: Diagnosis not present

## 2017-08-26 LAB — POCT INR: INR: 3.3

## 2017-08-27 ENCOUNTER — Telehealth: Payer: Self-pay | Admitting: Cardiology

## 2017-08-27 NOTE — Telephone Encounter (Signed)
Please call,concerning pt,[name].

## 2017-08-27 NOTE — Telephone Encounter (Signed)
Returned call to [facility] with [facility].She stated she wanted to make sure Dr.Jordan received bmet that was done yesterday.Bun 35 Creat 1.12.Advised to refax labs since patient has appointment with [name] PA 08/29/17 at 11:00 am.Stated patient still has some swelling in back of thighs.B/P 100/62.Advised to keep appointment with [facility] as planned.

## 2017-08-28 ENCOUNTER — Encounter: Payer: Self-pay | Admitting: Family

## 2017-08-29 ENCOUNTER — Ambulatory Visit (INDEPENDENT_AMBULATORY_CARE_PROVIDER_SITE_OTHER): Payer: Medicare Other | Admitting: Physician Assistant

## 2017-08-29 ENCOUNTER — Encounter: Payer: Self-pay | Admitting: Physician Assistant

## 2017-08-29 VITALS — BP 123/72 | HR 83 | Ht 68.0 in | Wt 209.0 lb

## 2017-08-29 DIAGNOSIS — E119 Type 2 diabetes mellitus without complications: Secondary | ICD-10-CM | POA: Diagnosis not present

## 2017-08-29 DIAGNOSIS — I779 Disorder of arteries and arterioles, unspecified: Secondary | ICD-10-CM

## 2017-08-29 DIAGNOSIS — I25810 Atherosclerosis of coronary artery bypass graft(s) without angina pectoris: Secondary | ICD-10-CM | POA: Diagnosis not present

## 2017-08-29 DIAGNOSIS — I6523 Occlusion and stenosis of bilateral carotid arteries: Secondary | ICD-10-CM

## 2017-08-29 DIAGNOSIS — I482 Chronic atrial fibrillation, unspecified: Secondary | ICD-10-CM

## 2017-08-29 DIAGNOSIS — I5032 Chronic diastolic (congestive) heart failure: Secondary | ICD-10-CM | POA: Diagnosis not present

## 2017-08-29 DIAGNOSIS — I38 Endocarditis, valve unspecified: Secondary | ICD-10-CM | POA: Diagnosis not present

## 2017-08-29 DIAGNOSIS — I10 Essential (primary) hypertension: Secondary | ICD-10-CM | POA: Diagnosis not present

## 2017-08-29 DIAGNOSIS — E785 Hyperlipidemia, unspecified: Secondary | ICD-10-CM

## 2017-08-29 DIAGNOSIS — I739 Peripheral vascular disease, unspecified: Secondary | ICD-10-CM

## 2017-08-29 MED ORDER — METOPROLOL SUCCINATE ER 25 MG PO TB24: 12.5000 mg | ORAL_TABLET | Freq: Every day | ORAL | 3 refills | Status: DC
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
## 2017-08-29 NOTE — Patient Instructions (Addendum)
Medication Instructions:   START METOPROLOL SUCC ER 12.5 MG ONCE DAILY  Labwork:  Your physician recommends that you return for lab work in: [place]:  Your physician recommends that you schedule a follow-up appointment in: ONE MONTH WITH DR [name]   If you need a refill on your cardiac medications before your next appointment, please call your pharmacy.    ELEVATE LEGS IN THE EVENINGS  WEAR COMPRESSION HOSE DURING THE DAY REMOVE AT BEDTIME

## 2017-08-30 ENCOUNTER — Other Ambulatory Visit: Payer: Self-pay | Admitting: Cardiology

## 2017-08-31 ENCOUNTER — Encounter: Payer: Self-pay | Admitting: Physician Assistant

## 2017-09-03 ENCOUNTER — Encounter: Payer: Self-pay | Admitting: Physician Assistant

## 2017-09-03 ENCOUNTER — Ambulatory Visit (HOSPITAL_COMMUNITY)
Admit: 2017-09-03 | Discharge: 2017-09-03 | Disposition: A | Discharge status: Home / Self Care | Payer: Medicare Other | Attending: Surgery | Admitting: Surgery

## 2017-09-03 ENCOUNTER — Ambulatory Visit (INDEPENDENT_AMBULATORY_CARE_PROVIDER_SITE_OTHER): Payer: Medicare Other | Admitting: Physician Assistant

## 2017-09-03 VITALS — BP 112/80 | HR 81 | Temp 97.0°F | Resp 16 | Ht 68.0 in | Wt 211.0 lb

## 2017-09-03 DIAGNOSIS — I6529 Occlusion and stenosis of unspecified carotid artery: Secondary | ICD-10-CM

## 2017-09-03 DIAGNOSIS — I6521 Occlusion and stenosis of right carotid artery: Secondary | ICD-10-CM

## 2017-09-03 DIAGNOSIS — I6523 Occlusion and stenosis of bilateral carotid arteries: Secondary | ICD-10-CM

## 2017-09-03 LAB — VAS US CAROTID
LCCADDIAS: -13 cm/s
LCCADSYS: -66 cm/s
LEFT ECA DIAS: -82 cm/s
LICADDIAS: -23 cm/s
LICADSYS: -105 cm/s
LICAPSYS: -129 cm/s
Left CCA prox dias: 18 cm/s
Left CCA prox sys: 81 cm/s
Left ICA prox dias: -22 cm/s
RCCADSYS: -126 cm/s
RCCAPSYS: 118 cm/s
RIGHT CCA MID DIAS: 9 cm/s
RIGHT ECA DIAS: 12 cm/s
RIGHT VERTEBRAL DIAS: -21 cm/s
Right CCA prox dias: 14 cm/s
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
## 2017-09-04 ENCOUNTER — Encounter: Payer: Self-pay | Admitting: Physician Assistant

## 2017-09-06 ENCOUNTER — Telehealth: Payer: Self-pay | Admitting: *Deleted

## 2017-09-06 MED ORDER — FUROSEMIDE 40 MG PO TABS: 40.0000 mg | ORAL_TABLET | Freq: Two times a day (BID) | ORAL | 3 refills | Status: DC

## 2017-09-06 NOTE — Telephone Encounter (Signed)
Results and recommendations discussed with patient, who verbalized understanding of medication changes and thanks. She will come in next week for labwork recheck.  She is aware to call if new concerns in interim, and aware I will follow up with her next week after repeat labwork.

## 2017-09-06 NOTE — Telephone Encounter (Signed)
Attempt to call back-line busy x 2.

## 2017-09-06 NOTE — Telephone Encounter (Signed)
Follow up     Pt is calling asking for a call back. She has one more question.

## 2017-09-09 ENCOUNTER — Ambulatory Visit: Payer: Self-pay | Admitting: Cardiology

## 2017-09-09 DIAGNOSIS — I482 Chronic atrial fibrillation, unspecified: Secondary | ICD-10-CM

## 2017-09-09 DIAGNOSIS — I4891 Unspecified atrial fibrillation: Secondary | ICD-10-CM | POA: Diagnosis not present

## 2017-09-09 DIAGNOSIS — I2510 Atherosclerotic heart disease of native coronary artery without angina pectoris: Secondary | ICD-10-CM | POA: Diagnosis not present

## 2017-09-09 LAB — PROTIME-INR: INR: 3.4 — AB (ref 0.9–1.1)

## 2017-09-09 NOTE — Telephone Encounter (Signed)
Left msg to call.

## 2017-09-11 ENCOUNTER — Telehealth: Payer: Self-pay

## 2017-09-11 NOTE — Telephone Encounter (Signed)
Yes thanks 

## 2017-09-11 NOTE — Telephone Encounter (Signed)
Received a call from [facility] who states that they were called out this week due to Sani having a few open places on her left leg and one on her right leg that are weeping due to edema. They feel that she would benefit from [facility] for wound management as they are more for monitoring purposes. Please call Arville Go back at [phone]. [facility] would resume services once home health gets wounds healed.  Ok to order home health?

## 2017-09-12 ENCOUNTER — Other Ambulatory Visit: Payer: Self-pay | Admitting: *Deleted

## 2017-09-12 DIAGNOSIS — I5032 Chronic diastolic (congestive) heart failure: Secondary | ICD-10-CM | POA: Diagnosis not present

## 2017-09-12 DIAGNOSIS — Z79899 Other long term (current) drug therapy: Secondary | ICD-10-CM

## 2017-09-12 DIAGNOSIS — I38 Endocarditis, valve unspecified: Secondary | ICD-10-CM | POA: Diagnosis not present

## 2017-09-13 LAB — BASIC METABOLIC PANEL
BUN / CREAT RATIO: 33 — AB (ref 12–28)
BUN: 38 mg/dL — AB (ref 8–27)
CO2: 22 mmol/L (ref 20–29)
CREATININE: 1.16 mg/dL — AB (ref 0.57–1.00)
Calcium: 9.5 mg/dL (ref 8.7–10.3)
Chloride: 96 mmol/L (ref 96–106)
GFR calc non Af Amer: 44 mL/min/{1.73_m2} — ABNORMAL LOW (ref >59)
GFR, EST AFRICAN AMERICAN: 51 mL/min/{1.73_m2} — AB (ref >59)
GLUCOSE: 93 mg/dL (ref 65–99)
Potassium: 4.4 mmol/L (ref 3.5–5.2)
SODIUM: 137 mmol/L (ref 134–144)

## 2017-09-16 ENCOUNTER — Telehealth: Payer: Self-pay | Admitting: Family Medicine

## 2017-09-16 NOTE — Telephone Encounter (Signed)
Pt states her legs continue to weep.  This has been going on 4 months. Pt states she cannot make an appt here today. Just not feeling good.  The nurse at [facility] advised she cannot keep coming to dress her legs since pt is in independent living.  Pt wants to know what you would have her do?  Wants to know if she could possible get home health, or should she go to the hospital?

## 2017-09-17 ENCOUNTER — Other Ambulatory Visit: Payer: Self-pay

## 2017-09-17 DIAGNOSIS — S81809A Unspecified open wound, unspecified lower leg, initial encounter: Secondary | ICD-10-CM

## 2017-09-17 NOTE — Telephone Encounter (Signed)
Referral placed.

## 2017-09-17 NOTE — Telephone Encounter (Signed)
Referral for Home Health placed. I called patient to let her know. She verbalized understanding

## 2017-09-18 ENCOUNTER — Telehealth: Payer: Self-pay

## 2017-09-18 ENCOUNTER — Encounter (HOSPITAL_COMMUNITY): Payer: Self-pay

## 2017-09-18 ENCOUNTER — Inpatient Hospital Stay (HOSPITAL_COMMUNITY)
Admission: EM | Admit: 2017-09-18 | Discharge: 2017-09-22 | DRG: 293 | Disposition: A | Discharge status: Home Health | Payer: Medicare Other | Attending: Internal Medicine | Admitting: Internal Medicine

## 2017-09-18 ENCOUNTER — Ambulatory Visit (INDEPENDENT_AMBULATORY_CARE_PROVIDER_SITE_OTHER): Payer: Medicare Other | Admitting: Family Medicine

## 2017-09-18 ENCOUNTER — Encounter: Payer: Self-pay | Admitting: Family Medicine

## 2017-09-18 ENCOUNTER — Emergency Department (HOSPITAL_COMMUNITY): Payer: Medicare Other

## 2017-09-18 DIAGNOSIS — Z4800 Encounter for change or removal of nonsurgical wound dressing: Secondary | ICD-10-CM | POA: Diagnosis not present

## 2017-09-18 DIAGNOSIS — I10 Essential (primary) hypertension: Secondary | ICD-10-CM | POA: Diagnosis not present

## 2017-09-18 DIAGNOSIS — M109 Gout, unspecified: Secondary | ICD-10-CM | POA: Diagnosis present

## 2017-09-18 DIAGNOSIS — Z888 Allergy status to other drugs, medicaments and biological substances status: Secondary | ICD-10-CM

## 2017-09-18 DIAGNOSIS — I130 Hypertensive heart and chronic kidney disease with heart failure and stage 1 through stage 4 chronic kidney disease, or unspecified chronic kidney disease: Secondary | ICD-10-CM | POA: Diagnosis present

## 2017-09-18 DIAGNOSIS — Z7984 Long term (current) use of oral hypoglycemic drugs: Secondary | ICD-10-CM | POA: Diagnosis not present

## 2017-09-18 DIAGNOSIS — Z951 Presence of aortocoronary bypass graft: Secondary | ICD-10-CM

## 2017-09-18 DIAGNOSIS — Z7901 Long term (current) use of anticoagulants: Secondary | ICD-10-CM | POA: Diagnosis not present

## 2017-09-18 DIAGNOSIS — L97222 Non-pressure chronic ulcer of left calf with fat layer exposed: Secondary | ICD-10-CM | POA: Diagnosis not present

## 2017-09-18 DIAGNOSIS — M479 Spondylosis, unspecified: Secondary | ICD-10-CM | POA: Diagnosis present

## 2017-09-18 DIAGNOSIS — Z8249 Family history of ischemic heart disease and other diseases of the circulatory system: Secondary | ICD-10-CM

## 2017-09-18 DIAGNOSIS — I4891 Unspecified atrial fibrillation: Secondary | ICD-10-CM | POA: Diagnosis not present

## 2017-09-18 DIAGNOSIS — Z882 Allergy status to sulfonamides status: Secondary | ICD-10-CM

## 2017-09-18 DIAGNOSIS — I83222 Varicose veins of left lower extremity with both ulcer of calf and inflammation: Secondary | ICD-10-CM | POA: Diagnosis not present

## 2017-09-18 DIAGNOSIS — M4800 Spinal stenosis, site unspecified: Secondary | ICD-10-CM | POA: Diagnosis not present

## 2017-09-18 DIAGNOSIS — N183 Chronic kidney disease, stage 3 unspecified: Secondary | ICD-10-CM | POA: Diagnosis present

## 2017-09-18 DIAGNOSIS — L97212 Non-pressure chronic ulcer of right calf with fat layer exposed: Secondary | ICD-10-CM | POA: Diagnosis not present

## 2017-09-18 DIAGNOSIS — Z955 Presence of coronary angioplasty implant and graft: Secondary | ICD-10-CM

## 2017-09-18 DIAGNOSIS — I272 Pulmonary hypertension, unspecified: Secondary | ICD-10-CM

## 2017-09-18 DIAGNOSIS — E876 Hypokalemia: Secondary | ICD-10-CM | POA: Diagnosis present

## 2017-09-18 DIAGNOSIS — I071 Rheumatic tricuspid insufficiency: Secondary | ICD-10-CM | POA: Diagnosis not present

## 2017-09-18 DIAGNOSIS — M10241 Drug-induced gout, right hand: Secondary | ICD-10-CM

## 2017-09-18 DIAGNOSIS — S91105D Unspecified open wound of left lesser toe(s) without damage to nail, subsequent encounter: Secondary | ICD-10-CM | POA: Diagnosis not present

## 2017-09-18 DIAGNOSIS — I482 Chronic atrial fibrillation: Secondary | ICD-10-CM | POA: Diagnosis not present

## 2017-09-18 DIAGNOSIS — K219 Gastro-esophageal reflux disease without esophagitis: Secondary | ICD-10-CM | POA: Diagnosis present

## 2017-09-18 DIAGNOSIS — E785 Hyperlipidemia, unspecified: Secondary | ICD-10-CM | POA: Diagnosis not present

## 2017-09-18 DIAGNOSIS — I2510 Atherosclerotic heart disease of native coronary artery without angina pectoris: Secondary | ICD-10-CM | POA: Diagnosis not present

## 2017-09-18 DIAGNOSIS — I5032 Chronic diastolic (congestive) heart failure: Secondary | ICD-10-CM

## 2017-09-18 DIAGNOSIS — I83212 Varicose veins of right lower extremity with both ulcer of calf and inflammation: Secondary | ICD-10-CM | POA: Diagnosis not present

## 2017-09-18 DIAGNOSIS — I6523 Occlusion and stenosis of bilateral carotid arteries: Secondary | ICD-10-CM

## 2017-09-18 DIAGNOSIS — E113291 Type 2 diabetes mellitus with mild nonproliferative diabetic retinopathy without macular edema, right eye: Secondary | ICD-10-CM | POA: Diagnosis not present

## 2017-09-18 DIAGNOSIS — I110 Hypertensive heart disease with heart failure: Secondary | ICD-10-CM | POA: Diagnosis not present

## 2017-09-18 DIAGNOSIS — R0602 Shortness of breath: Secondary | ICD-10-CM

## 2017-09-18 DIAGNOSIS — F329 Major depressive disorder, single episode, unspecified: Secondary | ICD-10-CM | POA: Diagnosis not present

## 2017-09-18 DIAGNOSIS — R238 Other skin changes: Secondary | ICD-10-CM | POA: Diagnosis not present

## 2017-09-18 DIAGNOSIS — Z79899 Other long term (current) drug therapy: Secondary | ICD-10-CM

## 2017-09-18 DIAGNOSIS — E1139 Type 2 diabetes mellitus with other diabetic ophthalmic complication: Secondary | ICD-10-CM | POA: Diagnosis present

## 2017-09-18 DIAGNOSIS — R600 Localized edema: Secondary | ICD-10-CM | POA: Diagnosis present

## 2017-09-18 DIAGNOSIS — Z87891 Personal history of nicotine dependence: Secondary | ICD-10-CM | POA: Diagnosis not present

## 2017-09-18 DIAGNOSIS — I5033 Acute on chronic diastolic (congestive) heart failure: Secondary | ICD-10-CM | POA: Diagnosis not present

## 2017-09-18 DIAGNOSIS — I480 Paroxysmal atrial fibrillation: Secondary | ICD-10-CM | POA: Diagnosis present

## 2017-09-18 LAB — BASIC METABOLIC PANEL
ANION GAP: 12 (ref 5–15)
BUN: 40 mg/dL — ABNORMAL HIGH (ref 6–20)
CALCIUM: 9.3 mg/dL (ref 8.9–10.3)
CO2: 24 mmol/L (ref 22–32)
Chloride: 99 mmol/L — ABNORMAL LOW (ref 101–111)
Creatinine, Ser: 1.06 mg/dL — ABNORMAL HIGH (ref 0.44–1.00)
GFR calc Af Amer: 55 mL/min — ABNORMAL LOW (ref >60)
GFR, EST NON AFRICAN AMERICAN: 48 mL/min — AB (ref >60)
GLUCOSE: 95 mg/dL (ref 65–99)
POTASSIUM: 4.3 mmol/L (ref 3.5–5.1)
Sodium: 135 mmol/L (ref 135–145)

## 2017-09-18 LAB — CBC
HEMATOCRIT: 36.4 % (ref 36.0–46.0)
HEMOGLOBIN: 11.4 g/dL — AB (ref 12.0–15.0)
MCH: 25.2 pg — ABNORMAL LOW (ref 26.0–34.0)
MCHC: 31.3 g/dL (ref 30.0–36.0)
MCV: 80.4 fL (ref 78.0–100.0)
Platelets: 268 10*3/uL (ref 150–400)
RBC: 4.53 MIL/uL (ref 3.87–5.11)
RDW: 19.7 % — ABNORMAL HIGH (ref 11.5–15.5)
WBC: 8.9 10*3/uL (ref 4.0–10.5)

## 2017-09-18 LAB — I-STAT TROPONIN, ED: TROPONIN I, POC: 0.03 ng/mL (ref 0.00–0.08)

## 2017-09-18 LAB — PROTIME-INR
INR: 1.95
PROTHROMBIN TIME: 22.1 s — AB (ref 11.4–15.2)

## 2017-09-18 MED ORDER — FUROSEMIDE 10 MG/ML IJ SOLN
40.0000 mg | INTRAMUSCULAR | Status: AC
  Administered 2017-09-18: 40 mg via INTRAVENOUS
  Filled 2017-09-18: qty 4

## 2017-09-18 MED ORDER — WARFARIN SODIUM 5 MG PO TABS
5.0000 mg | ORAL_TABLET | Freq: Once | ORAL | Status: AC
  Administered 2017-09-19: 5 mg via ORAL
  Filled 2017-09-18: qty 1

## 2017-09-18 MED ORDER — ACETAMINOPHEN 325 MG PO TABS
650.0000 mg | ORAL_TABLET | ORAL | Status: DC | PRN
  Administered 2017-09-19 – 2017-09-22 (×4): 650 mg via ORAL
  Filled 2017-09-18 (×3): qty 2

## 2017-09-18 MED ORDER — FUROSEMIDE 10 MG/ML IJ SOLN
40.0000 mg | Freq: Two times a day (BID) | INTRAMUSCULAR | Status: DC
  Administered 2017-09-19 – 2017-09-22 (×7): 40 mg via INTRAVENOUS
  Filled 2017-09-18 (×7): qty 4

## 2017-09-18 MED ORDER — SODIUM CHLORIDE 0.9% FLUSH
3.0000 mL | Freq: Two times a day (BID) | INTRAVENOUS | Status: DC
  Administered 2017-09-19 – 2017-09-22 (×8): 3 mL via INTRAVENOUS

## 2017-09-18 MED ORDER — SODIUM CHLORIDE 0.9 % IV SOLN: 250.0000 mL | INTRAVENOUS | Status: DC | PRN

## 2017-09-18 MED ORDER — SODIUM CHLORIDE 0.9% FLUSH: 3.0000 mL | INTRAVENOUS | Status: DC | PRN

## 2017-09-18 MED ORDER — LORATADINE 10 MG PO TABS: 10.0000 mg | ORAL_TABLET | Freq: Every day | ORAL | Status: DC | PRN

## 2017-09-18 MED ORDER — POTASSIUM CHLORIDE CRYS ER 20 MEQ PO TBCR
20.0000 meq | EXTENDED_RELEASE_TABLET | Freq: Every day | ORAL | Status: DC
  Administered 2017-09-19 – 2017-09-22 (×4): 20 meq via ORAL
  Filled 2017-09-18 (×4): qty 1

## 2017-09-18 MED ORDER — METOPROLOL SUCCINATE ER 25 MG PO TB24
12.5000 mg | ORAL_TABLET | Freq: Every day | ORAL | Status: DC
  Administered 2017-09-19 – 2017-09-22 (×4): 12.5 mg via ORAL
  Filled 2017-09-18 (×2): qty 0.5
  Filled 2017-09-18 (×3): qty 1

## 2017-09-18 MED ORDER — ONDANSETRON HCL 4 MG/2ML IJ SOLN: 4.0000 mg | Freq: Four times a day (QID) | INTRAMUSCULAR | Status: DC | PRN

## 2017-09-18 MED ORDER — PANTOPRAZOLE SODIUM 40 MG PO TBEC
40.0000 mg | DELAYED_RELEASE_TABLET | Freq: Every day | ORAL | Status: DC
  Administered 2017-09-19 – 2017-09-22 (×4): 40 mg via ORAL
  Filled 2017-09-18 (×4): qty 1

## 2017-09-18 MED ORDER — WARFARIN - PHARMACIST DOSING INPATIENT
Freq: Every day | Status: DC
Start: 2017-09-19 — End: 2017-09-22
  Administered 2017-09-19: 1
  Administered 2017-09-20: 18:00:00
  Administered 2017-09-21: 1
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
## 2017-09-18 NOTE — Assessment & Plan Note (Addendum)
S: Weight was 206 at last visit here and now back up to 210. Last visit had her on BID lasix 40mg . Saw cardiology 20 days ago and was advised continue current dose of lasix. They added metoprolol back at 12.5mg  XR.  She has also started to have weeping from bilateral legs R >L. Her soaks will be soaked unless she changes dressing a few times a day which home health is unable to do. Creatinine has trended up as well. Patient feels very winded- more so than last visit and extremely fatigued. This has been going on for 1-2 weeks since leaving cardiology and slowly worsening.  Wt Readings from Last 3 Encounters:  09/18/17 210 lb 6.4 oz (95.4 kg)  09/03/17 211 lb (95.7 kg)  08/29/17 209 lb (94.8 kg)  A/P:She has asked me multiple times before if she could be hospitalized to have diuresis- when this was done 2 years ago- she came out and felt much much better. May need updated echocardiogram.   Given months of efforts at this point of diuresing and worsening symptoms outpatient as well as increasing creatinine- I agreed with her that hospitalization is appropriate for diuresis/monitoring.   I do not think she has cellulitis in the leg- no warmth or erythema though suspect they may still cover her for that to be cautious. She plans to have her daughter take her to the ER. They can also help her with elevation/wound care/dressing changes which she does not feel capable of doing in her current condition. She is considering but resistant to changing to assisted living- I suggested this may be the right move.

## 2017-09-18 NOTE — Patient Instructions (Signed)
Please have your daughter take you to the emergency room this evening. I believe you are going to need close monitoring/IV diuresis (getting fluid off) to help you feel better

## 2017-09-18 NOTE — ED Provider Notes (Signed)
Medical screening examination/treatment/procedure(s) were conducted as a shared visit with non-physician practitioner(s) and myself.  I personally evaluated the patient during the encounter.   EKG Interpretation  Date/Time:  Wednesday September 18 2017 17:31:05 EDT Ventricular Rate:  78 PR Interval:    QRS Duration: 96 QT Interval:  388 QTC Calculation: 442 R Axis:   172 Text Interpretation:  Undetermined rhythm Incomplete right bundle branch block Possible Right ventricular hypertrophy Cannot rule out Anteroseptal infarct , age undetermined Abnormal ECG No STEMI. No significant change from previous. Confirmed by Charlesetta Shanks [phone]) on 09/18/2017 11:16:08 PM     patient reports that she's been becoming increasingly short of breath. Also her legs are swelling and fluid is draining out of them. She has been taking Lasix but symptoms have been worsening. Patient is alert and appropriate. Heart is irregular. Mild increased work of breathing. Crackles at bases. Peripheral edema bilateral lower extremities with clear fluid weeping. Plan will be for admission for congestive heart failure. I agree with plan of management.   Charlesetta Shanks, MD 09/18/17 [phone]
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
## 2017-09-18 NOTE — ED Triage Notes (Signed)
Patient complains of increased SOB and bilateral leg swelling and weeping for several weeks. Patient denies CP, alert and oriented.

## 2017-09-18 NOTE — Progress Notes (Signed)
Subjective:  [name] is a 83 y.o. year old very pleasant female patient who presents for/with See problem oriented charting ROS- no chest pain. Does have shortness of breath and severe fatigue. Worsening edema.    Past Medical History-  Patient Active Problem List   Diagnosis Date Noted  . Anemia, iron deficiency 05/15/2016    Priority: High  . Non-proliferative diabetic retinopathy, mild, left eye ([place]) 10/10/2015    Priority: High  . Atrial fibrillation ([place]) 01/08/2014    Priority: High  . Diastolic CHF, chronic ([place]) 01/02/2014    Priority: High  . Type II diabetes mellitus with ophthalmic manifestations ([place]) 01/02/2014    Priority: High  . Coronary artery disease     Priority: High  . Carotid artery stenosis 04/01/2012    Priority: High  . Depression 03/25/2015    Priority: Medium  . Spinal stenosis     Priority: Medium  . HTN (hypertension) 08/29/2011    Priority: Medium  . Hyperlipidemia 07/18/2011    Priority: Medium  . Allergic rhinitis 11/19/2015    Priority: Low  . GERD (gastroesophageal reflux disease) 03/25/2015    Priority: Low  . Long term current use of anticoagulant therapy 01/08/2014    Priority: Low  . Lichen sclerosus     Priority: Low  . Fecal incontinence 07/09/2017    Medications- reviewed and updated Current Outpatient Prescriptions  Medication Sig Dispense Refill  . clobetasol cream (TEMOVATE) 0.05 % APPLY NIGHTLY AS NEEDED FOR IRRITATION. 7 days in a row maximum 60 g 0  . furosemide (LASIX) 40 MG tablet Take 1 tablet (40 mg total) by mouth 2 (two) times daily. 90 tablet 3  . loratadine (CLARITIN) 10 MG tablet Take 10 mg by mouth daily as needed for allergies.    . metFORMIN (GLUCOPHAGE) 500 MG tablet TAKE ONE TABLET BY MOUTH EVERY MORNING WITH BREAKFAST 90 tablet 3  . metoprolol succinate (TOPROL-XL) 25 MG 24 hr tablet Take 0.5 tablets (12.5 mg total) by mouth daily. 90 tablet 3  . nitroGLYCERIN (NITROSTAT) 0.4 MG SL tablet Place 1  tablet (0.4 mg total) under the tongue every 5 (five) minutes as needed. For chest pain 25 tablet 2  . omeprazole (PRILOSEC) 20 MG capsule Take 1 capsule (20 mg total) by mouth daily. 90 capsule 3  . potassium chloride (K-DUR) 10 MEQ tablet Take 2 tablets (20 mEq total) by mouth as directed. 180 tablet 3  . warfarin (COUMADIN) 5 MG tablet TAKE 1/2 TO 1 TABLET DAILY AS DIRECTED BY COUMADIN CLINIC. 30 tablet 1   No current facility-administered medications for this visit.     Objective: BP 124/76 (BP Location: Left Arm, Patient Position: Sitting, Cuff Size: Large)   Pulse 80   Temp 97.6 F (36.4 C) (Oral)   Ht 5\' 8"  (1.727 m)   Wt 210 lb 6.4 oz (95.4 kg)   SpO2 92%   BMI 31.99 kg/m  Gen: NAD, resting comfortably CV: RRR no murmurs rubs or gallops Lungs: CTAB no crackles, wheeze, rhonchi Ext: 1-2+ pitting edema (worse from last visit) Skin: warm, dry, weeping from right shin almost continuous once dressing removed   Assessment/Plan:  Diastolic CHF, chronic (HCC) S: Weight was 206 at last visit here and now back up to 210. Last visit had her on BID lasix 40mg . Saw cardiology 20 days ago and was advised continue current dose of lasix. They added metoprolol back at 12.5mg  XR.  She has also started to have weeping from bilateral  legs R >L. Her soaks will be soaked unless she changes dressing a few times a day which home health is unable to do. Creatinine has trended up as well. Patient feels very winded- more so than last visit and extremely fatigued. This has been going on for 1-2 weeks since leaving cardiology and slowly worsening.  Wt Readings from Last 3 Encounters:  09/18/17 210 lb 6.4 oz (95.4 kg)  09/03/17 211 lb (95.7 kg)  08/29/17 209 lb (94.8 kg)  A/P:She has asked me multiple times before if she could be hospitalized to have diuresis- when this was done 2 years ago- she came out and felt much much better. May need updated echocardiogram.   Given months of efforts at this point  of diuresing and worsening symptoms outpatient as well as increasing creatinine- I agreed with her that hospitalization is appropriate for diuresis/monitoring.   I do not think she has cellulitis in the leg- no warmth or erythema though suspect they may still cover her for that to be cautious. She plans to have her daughter take her to the ER. They can also help her with elevation/wound care/dressing changes which she does not feel capable of doing in her current condition. She is considering but resistant to changing to assisted living- I suggested this may be the right move.   Future Appointments Date Time Provider [place]  09/27/2017 8:00 AM [name], [name], MD [facility]   Return precautions advised.  [name], MD

## 2017-09-18 NOTE — Telephone Encounter (Signed)
Denise @ [facility] is calling. She is at pt's home for initial visit. She reports pt has had BLE edema, +2-3, and weeping, R worse than L. Langley Gauss is very concerned. She gave vitals of: 198# BP 110-58 HR 83 O2 94% room air  She states that pt reports these symptoms have increased over the past week. She also mentioned the 3rd Right toe needs to be assessed.   Appt scheduled with Dr. Yong Channel for this afternoon, pt aware.   Dr. Yong Channel - FYI. Thanks!
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
## 2017-09-19 ENCOUNTER — Ambulatory Visit (HOSPITAL_BASED_OUTPATIENT_CLINIC_OR_DEPARTMENT_OTHER): Payer: Medicare Other

## 2017-09-19 DIAGNOSIS — I361 Nonrheumatic tricuspid (valve) insufficiency: Secondary | ICD-10-CM

## 2017-09-19 DIAGNOSIS — I2510 Atherosclerotic heart disease of native coronary artery without angina pectoris: Secondary | ICD-10-CM | POA: Diagnosis present

## 2017-09-19 DIAGNOSIS — E1139 Type 2 diabetes mellitus with other diabetic ophthalmic complication: Secondary | ICD-10-CM | POA: Diagnosis not present

## 2017-09-19 DIAGNOSIS — I350 Nonrheumatic aortic (valve) stenosis: Secondary | ICD-10-CM | POA: Diagnosis not present

## 2017-09-19 DIAGNOSIS — I482 Chronic atrial fibrillation: Secondary | ICD-10-CM

## 2017-09-19 DIAGNOSIS — Z79899 Other long term (current) drug therapy: Secondary | ICD-10-CM | POA: Diagnosis not present

## 2017-09-19 DIAGNOSIS — I480 Paroxysmal atrial fibrillation: Secondary | ICD-10-CM | POA: Diagnosis present

## 2017-09-19 DIAGNOSIS — Z8249 Family history of ischemic heart disease and other diseases of the circulatory system: Secondary | ICD-10-CM | POA: Diagnosis not present

## 2017-09-19 DIAGNOSIS — E785 Hyperlipidemia, unspecified: Secondary | ICD-10-CM | POA: Diagnosis present

## 2017-09-19 DIAGNOSIS — I5033 Acute on chronic diastolic (congestive) heart failure: Secondary | ICD-10-CM | POA: Diagnosis not present

## 2017-09-19 DIAGNOSIS — I272 Pulmonary hypertension, unspecified: Secondary | ICD-10-CM | POA: Diagnosis not present

## 2017-09-19 DIAGNOSIS — I071 Rheumatic tricuspid insufficiency: Secondary | ICD-10-CM | POA: Diagnosis present

## 2017-09-19 DIAGNOSIS — Z7901 Long term (current) use of anticoagulants: Secondary | ICD-10-CM | POA: Diagnosis not present

## 2017-09-19 DIAGNOSIS — Z882 Allergy status to sulfonamides status: Secondary | ICD-10-CM | POA: Diagnosis not present

## 2017-09-19 DIAGNOSIS — K219 Gastro-esophageal reflux disease without esophagitis: Secondary | ICD-10-CM | POA: Diagnosis not present

## 2017-09-19 DIAGNOSIS — Z955 Presence of coronary angioplasty implant and graft: Secondary | ICD-10-CM | POA: Diagnosis not present

## 2017-09-19 DIAGNOSIS — M10241 Drug-induced gout, right hand: Secondary | ICD-10-CM | POA: Diagnosis not present

## 2017-09-19 DIAGNOSIS — I10 Essential (primary) hypertension: Secondary | ICD-10-CM | POA: Diagnosis not present

## 2017-09-19 DIAGNOSIS — R600 Localized edema: Secondary | ICD-10-CM | POA: Diagnosis present

## 2017-09-19 DIAGNOSIS — Z7984 Long term (current) use of oral hypoglycemic drugs: Secondary | ICD-10-CM | POA: Diagnosis not present

## 2017-09-19 DIAGNOSIS — Z951 Presence of aortocoronary bypass graft: Secondary | ICD-10-CM | POA: Diagnosis not present

## 2017-09-19 DIAGNOSIS — E876 Hypokalemia: Secondary | ICD-10-CM | POA: Diagnosis not present

## 2017-09-19 DIAGNOSIS — I110 Hypertensive heart disease with heart failure: Secondary | ICD-10-CM | POA: Diagnosis present

## 2017-09-19 DIAGNOSIS — Z888 Allergy status to other drugs, medicaments and biological substances status: Secondary | ICD-10-CM | POA: Diagnosis not present

## 2017-09-19 DIAGNOSIS — R0602 Shortness of breath: Secondary | ICD-10-CM | POA: Diagnosis not present

## 2017-09-19 DIAGNOSIS — M479 Spondylosis, unspecified: Secondary | ICD-10-CM | POA: Diagnosis present

## 2017-09-19 DIAGNOSIS — M109 Gout, unspecified: Secondary | ICD-10-CM | POA: Diagnosis present

## 2017-09-19 LAB — BASIC METABOLIC PANEL
ANION GAP: 11 (ref 5–15)
BUN: 40 mg/dL — ABNORMAL HIGH (ref 6–20)
CHLORIDE: 97 mmol/L — AB (ref 101–111)
CO2: 25 mmol/L (ref 22–32)
Calcium: 9 mg/dL (ref 8.9–10.3)
Creatinine, Ser: 1.07 mg/dL — ABNORMAL HIGH (ref 0.44–1.00)
GFR calc non Af Amer: 47 mL/min — ABNORMAL LOW (ref >60)
GFR, EST AFRICAN AMERICAN: 54 mL/min — AB (ref >60)
GLUCOSE: 100 mg/dL — AB (ref 65–99)
Potassium: 3.9 mmol/L (ref 3.5–5.1)
Sodium: 133 mmol/L — ABNORMAL LOW (ref 135–145)

## 2017-09-19 LAB — PROTIME-INR
INR: 1.98
Prothrombin Time: 22.4 seconds — ABNORMAL HIGH (ref 11.4–15.2)

## 2017-09-19 LAB — HEMOGLOBIN A1C
HEMOGLOBIN A1C: 5.5 % (ref 4.8–5.6)
Mean Plasma Glucose: 111.15 mg/dL

## 2017-09-19 LAB — ECHOCARDIOGRAM COMPLETE
HEIGHTINCHES: 68 in
Weight: 3366.4 oz

## 2017-09-19 LAB — GLUCOSE, CAPILLARY: Glucose-Capillary: 111 mg/dL — ABNORMAL HIGH (ref 65–99)

## 2017-09-19 LAB — BRAIN NATRIURETIC PEPTIDE: B Natriuretic Peptide: 2268 pg/mL — ABNORMAL HIGH (ref 0.0–100.0)

## 2017-09-19 LAB — CBG MONITORING, ED: GLUCOSE-CAPILLARY: 79 mg/dL (ref 65–99)

## 2017-09-19 MED ORDER — TRAZODONE HCL 50 MG PO TABS
50.0000 mg | ORAL_TABLET | Freq: Once | ORAL | Status: DC
  Filled 2017-09-19: qty 1

## 2017-09-19 MED ORDER — INSULIN ASPART 100 UNIT/ML ~~LOC~~ SOLN
0.0000 [IU] | Freq: Three times a day (TID) | SUBCUTANEOUS | Status: DC
  Administered 2017-09-21: 2 [IU] via SUBCUTANEOUS

## 2017-09-19 MED ORDER — INSULIN ASPART 100 UNIT/ML ~~LOC~~ SOLN: 0.0000 [IU] | Freq: Every day | SUBCUTANEOUS | Status: DC

## 2017-09-19 MED ORDER — WARFARIN SODIUM 5 MG PO TABS
5.0000 mg | ORAL_TABLET | Freq: Once | ORAL | Status: AC
  Administered 2017-09-19: 5 mg via ORAL
  Filled 2017-09-19 (×2): qty 1

## 2017-09-19 NOTE — ED Notes (Signed)
Patient CBG was 79.

## 2017-09-19 NOTE — ED Notes (Signed)
Visitor at the bedside with patient. Verbalizes no needs at this time.

## 2017-09-19 NOTE — ED Notes (Signed)
Admitting MD at the bedside.  

## 2017-09-19 NOTE — Consult Note (Addendum)
[place] Nurse wound consult note Reason for Consult: partial thickness and full thickness BLE ulcers, full thickness on two toes. Wound type:venous insufficiency, neuropathic Pressure Injury POA: NA Measurement: Left posterior calf 2cm x 7cm (wraps around posterior and medial and lateral calf) partial thickness with 100% pink wound bed, no drainage or odor noted. Looks like healing scar but pt denies ever having a wound there. Left pretibial 1cm x 0.5cm x 0.1 full thickness venous stasis ulcer, soft scab, no odor. Left medial ankle 0.2cm x 0.2cm x 0.1cm full thickness venous stasis ulcer, pink wound bed, no drainage or odor. Left third toe 1cm x 1.2cm 100% hardened scabbed over ulcer on tip just distal to nail. Right pretibial 1cm x 0.5cm x 0.1cm pink wound bed,serous fluid dripping out while assessing.  . Right great toe 0.6cm x 0.6cm 100% hardened scab on lateral planter surface.  Wound bed: see above Drainage (amount, consistency, odor) see above Periwound:erythema cellulitis bilateral lower extremities Dressing procedure/placement/frequency:I have provided nurses with orders for Cleansing and applying Xeroform to bilateral lower legs, wrap with kerlix, wrap in a spiral fashion from toes to just below knee, change daily. I have placed order for ED nurses Please place same orders via signed and held for admission orders when pt gets a transferred to a regular in pt bed. We will not follow, but will remain available to this patient, to nursing, and the medical and/or surgical teams.  Please re-consult if we need to assist further.   Fara Olden, RN-C, WTA-C Wound Treatment Associate

## 2017-09-19 NOTE — ED Notes (Signed)
C/o cramping in her feet medication given

## 2017-09-19 NOTE — ED Notes (Signed)
Wound care nurse at bedside.
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
## 2017-09-19 NOTE — Progress Notes (Signed)
[facility] PROGRESS NOTE  [name] NID:[id number] DOB: 08-19-1933 DOA: 09/18/2017 PCP: [name], MD  Interim summary and HPI 83 y.o. female with medical history significant of diastolic CHF with pulmonary HTN on EKG in 2017, preserved EF.  CAD s/p stenting.  HTN.  Patient presents to the ED with c/o mild SOB and severe peripheral edema now with skin break down and fluid draining out of legs.  Symptoms ongoing and progressively worsening over last couple of months.  Has been taking lasix as directed but symptoms progressing despite treatment.  Assessment/Plan: 1-acute on chronic diastolic HF -continue oxygen supplementation -will continue IV diuretics -follow daily weights, strict intake and output  -follow clinical response   2-HTN -stable overall  -will continue current antihypertensive drugs   3-type 2 diabetes: -will hold oral hypoglycemic regimen  -will check A1C and use SSI  4-A. Fib -continue metoprolol for rate control -continue coumadin per pharmacy   5-LEG swelling/whipping  -follow rec's from [place] service -continue diuresis   6-pulmonary HTN -might benefit use of revatio -for now continue lasix  7-GERD -will continue PPI  8-hypokalemia -potassium stable currently -will continue daily supplementation given active diuresis   Code Status: Full Family Communication: no family at bedside  Disposition Plan: continue IV diuretics; monitor on telemetry; no CP. Follow clinical response    Consultants:  None   Procedures:  2-D echo: - Left ventricle: The cavity size was normal. There was moderate   concentric hypertrophy. Systolic function was vigorous. The   estimated ejection fraction was in the range of 65% to 70%. Wall   motion was normal; there were no regional wall motion   abnormalities. - Ventricular septum: The contour showed diastolic flattening and   systolic flattening. - Aortic valve: Valve mobility was restricted. There  was moderate   stenosis. Mean gradient (S): 24 mm Hg. Peak gradient (S): 40 mm   Hg. Valve area (VTI): 0.73 cm^2. Valve area (Vmax): 0.8 cm^2.   Valve area (Vmean): 0.71 cm^2. - Mitral valve: The findings are consistent with mild stenosis.   There was mild regurgitation. Mean gradient (D): 3 mm Hg. Valve   area by continuity equation (using LVOT flow): 1.05 cm^2. - Left atrium: The atrium was moderately dilated. - Right ventricle: The cavity size was severely dilated. Wall   thickness was normal. Systolic function was moderately reduced. - Right atrium: The atrium was severely dilated. - Tricuspid valve: There was severe regurgitation. - Pulmonic valve: There was mild regurgitation. - Pulmonary arteries: Systolic pressure was moderately increased.   PA peak pressure: 43 mm Hg (S). - Inferior vena cava: The vessel was dilated. The respirophasic   diameter changes were blunted (< 50%), consistent with elevated   central venous pressure. - Pericardium, extracardiac: There was no pericardial effusion.  Antibiotics:  None   HPI/Subjective: Afebrile, no CP. Reporting SOB and LE edema.   Objective: Vitals:   09/19/17 1330 09/19/17 1400  BP: (!) 102/49 (!) 100/49  Pulse: 68 61  Resp: 17 20  Temp:    SpO2: 96% 98%    Intake/Output Summary (Last 24 hours) at 09/19/17 1738 Last data filed at 09/19/17 0708  Gross per 24 hour  Intake                0 ml  Output              750 ml  Net             -  750 ml   Filed Weights   09/19/17 1620  Weight: 93.2 kg (205 lb 6.4 oz)    Exam:   General:  Afebrile, no CP. Reported SOB and whipping edema of her LE  Cardiovascular: S1 and S2, no rubs, no gallops; mild JVD on exam  Respiratory: fair air movement, positive rhonchi and bibasilar crackles.   Abdomen: soft, NT, ND, positive BS  Musculoskeletal: 3++ edema bilaterally, no cyanosis   Data Reviewed: Basic Metabolic Panel:  Recent Labs Lab 09/18/17 1810 09/19/17 0208  NA  135 133*  K 4.3 3.9  CL 99* 97*  CO2 24 25  GLUCOSE 95 100*  BUN 40* 40*  CREATININE 1.06* 1.07*  CALCIUM 9.3 9.0   CBC:  Recent Labs Lab 09/18/17 1810  WBC 8.9  HGB 11.4*  HCT 36.4  MCV 80.4  PLT 268   BNP (last 3 results)  Recent Labs  12/11/16 0959 04/15/17 1551 09/18/17 1810  BNP 1,146.2* 1,616.2* 2,268.0*    CBG:  Recent Labs Lab 09/19/17 1246  GLUCAP 79    Studies: Dg Chest 2 View  Result Date: 09/18/2017 CLINICAL DATA:  Increased shortness of breath and bilateral leg swelling. EXAM: CHEST  2 VIEW COMPARISON:  08/15/2017 FINDINGS: Stable cardiomegaly. Prior median sternotomy and evidence of previous CABG procedure. Lung markings are slightly prominent in the right upper lung and right hilar region. Difficult to exclude mild edema. Increased densities along the medial left chest base are similar to the previous examination of some of this could be chronic. Findings could also be associated small effusions. Negative for pneumothorax. IMPRESSION: Cardiomegaly and suspect mild pulmonary edema. Minimal change from the previous examination. Cannot exclude small pleural effusions. Electronically Signed   By: Markus Daft M.D.   On: 09/18/2017 18:38    Scheduled Meds: . furosemide  40 mg Intravenous BID  . metoprolol succinate  12.5 mg Oral Daily  . pantoprazole  40 mg Oral Daily  . potassium chloride  20 mEq Oral Daily  . sodium chloride flush  3 mL Intravenous Q12H  . warfarin  5 mg Oral ONCE-1800  . Warfarin - Pharmacist Dosing Inpatient   Does not apply q1800   Continuous Infusions: . sodium chloride       Time spent: 30 minutes    Barton Dubois  [facility] Pager [phone]. If 7PM-7AM, please contact night-coverage at [email or web address], password [facility] 09/19/2017, 5:38 PM  LOS: 0 days

## 2017-09-19 NOTE — Progress Notes (Signed)
  Echocardiogram 2D Echocardiogram has been performed.  Merrie Roof F 09/19/2017, 3:55 PM

## 2017-09-19 NOTE — Progress Notes (Signed)
Pt educated about safety and importance of bed alarm during the night however pt refuses to be on bed alarm. Will continue to round on patient.   Tzirel Leonor, RN    

## 2017-09-19 NOTE — Discharge Planning (Signed)
EDCM received called from Olivia Mackie, [place] and Tammy, RN of [facility] regarding pt code status.  Per Olivia Mackie and [place], Pt has recently became DNR due to ongoing CHF symptoms and that status needs to be reflected in the hospital setting.   EDCM spoke with EDP to have it addressed with pt.

## 2017-09-19 NOTE — ED Notes (Signed)
Patient c/o cramp in her right ankle -  Repositioned leg and had her push on my arm with her foot.  Patient stated it was feeling better

## 2017-09-19 NOTE — ED Notes (Signed)
Patient moved to a hospital bed.  Positioned for comfort, pad under lower legs due to "weeping".

## 2017-09-19 NOTE — ED Notes (Signed)
Lunch tray given , sitting on the side of the bed, no complaints voiced.

## 2017-09-20 DIAGNOSIS — R0602 Shortness of breath: Secondary | ICD-10-CM

## 2017-09-20 LAB — BASIC METABOLIC PANEL
Anion gap: 11 (ref 5–15)
BUN: 44 mg/dL — AB (ref 6–20)
CO2: 25 mmol/L (ref 22–32)
CREATININE: 1.21 mg/dL — AB (ref 0.44–1.00)
Calcium: 9 mg/dL (ref 8.9–10.3)
Chloride: 98 mmol/L — ABNORMAL LOW (ref 101–111)
GFR, EST AFRICAN AMERICAN: 47 mL/min — AB (ref >60)
GFR, EST NON AFRICAN AMERICAN: 41 mL/min — AB (ref >60)
Glucose, Bld: 110 mg/dL — ABNORMAL HIGH (ref 65–99)
Potassium: 4.3 mmol/L (ref 3.5–5.1)
SODIUM: 134 mmol/L — AB (ref 135–145)

## 2017-09-20 LAB — CBC
HCT: 38 % (ref 36.0–46.0)
Hemoglobin: 11.6 g/dL — ABNORMAL LOW (ref 12.0–15.0)
MCH: 24.5 pg — AB (ref 26.0–34.0)
MCHC: 30.5 g/dL (ref 30.0–36.0)
MCV: 80.3 fL (ref 78.0–100.0)
Platelets: 266 10*3/uL (ref 150–400)
RBC: 4.73 MIL/uL (ref 3.87–5.11)
RDW: 19.7 % — ABNORMAL HIGH (ref 11.5–15.5)
WBC: 7.6 10*3/uL (ref 4.0–10.5)

## 2017-09-20 LAB — GLUCOSE, CAPILLARY
GLUCOSE-CAPILLARY: 117 mg/dL — AB (ref 65–99)
GLUCOSE-CAPILLARY: 106 mg/dL — AB (ref 65–99)
Glucose-Capillary: 96 mg/dL (ref 65–99)
Glucose-Capillary: 95 mg/dL (ref 65–99)

## 2017-09-20 LAB — PROTIME-INR
INR: 2.25
PROTHROMBIN TIME: 24.7 s — AB (ref 11.4–15.2)

## 2017-09-20 MED ORDER — METOLAZONE 2.5 MG PO TABS
2.5000 mg | ORAL_TABLET | Freq: Every day | ORAL | Status: DC
  Administered 2017-09-20 – 2017-09-22 (×3): 2.5 mg via ORAL
  Filled 2017-09-20 (×3): qty 1

## 2017-09-20 MED ORDER — WARFARIN SODIUM 2.5 MG PO TABS
2.5000 mg | ORAL_TABLET | Freq: Once | ORAL | Status: AC
  Administered 2017-09-20: 2.5 mg via ORAL
  Filled 2017-09-20: qty 1

## 2017-09-20 NOTE — Progress Notes (Signed)
[facility] PROGRESS NOTE  [name] HWE:[id number] DOB: 19-May-1933 DOA: 09/18/2017 PCP: [name], MD  Interim summary and HPI 83 y.o. female with medical history significant of diastolic CHF with pulmonary HTN on EKG in 2017, preserved EF.  CAD s/p stenting.  HTN.  Patient presents to the ED with c/o mild SOB and severe peripheral edema now with skin break down and fluid draining out of legs.  Symptoms ongoing and progressively worsening over last couple of months.  Has been taking lasix as directed but symptoms progressing despite treatment.  Assessment/Plan: 1-acute on chronic diastolic HF -patient now off O2 supplementation. -will continue IV diuretics and will add metolazone  -continue to follow daily weights, strict intake and output  -continue current therapy and follow clinical response   2-HTN -stable overall  -will continue current antihypertensive drugs   3-type 2 diabetes: -will hold oral hypoglycemic regimen  -A1C 5.5 -continue SSI and modified carb diet while inpatient   4-A. Fib: PAF -continue metoprolol for rate control -continue coumadin per pharmacy  -CHADsVASC score 3-4  5-LEG swelling/whipping  -follow rec's from [place] service -continue diuresis  -wrapping compression therapy initiated   6-pulmonary HTN -might benefit use of revatio at discharge  -for now continue lasix therapy -patient educated in need of low sodium diet   7-GERD -will continue PPI  8-hypokalemia -will continue daily supplementation given active diuresis  -monitoring patient on telemetry   Code Status: Full Family Communication: no family at bedside  Disposition Plan: continue IV diuretics; monitor on telemetry; no CP. Follow clinical response    Consultants:  None   Procedures:  2-D echo: - Left ventricle: The cavity size was normal. There was moderate   concentric hypertrophy. Systolic function was vigorous. The   estimated ejection fraction was in  the range of 65% to 70%. Wall   motion was normal; there were no regional wall motion   abnormalities. - Ventricular septum: The contour showed diastolic flattening and   systolic flattening. - Aortic valve: Valve mobility was restricted. There was moderate   stenosis. Mean gradient (S): 24 mm Hg. Peak gradient (S): 40 mm   Hg. Valve area (VTI): 0.73 cm^2. Valve area (Vmax): 0.8 cm^2.   Valve area (Vmean): 0.71 cm^2. - Mitral valve: The findings are consistent with mild stenosis.   There was mild regurgitation. Mean gradient (D): 3 mm Hg. Valve   area by continuity equation (using LVOT flow): 1.05 cm^2. - Left atrium: The atrium was moderately dilated. - Right ventricle: The cavity size was severely dilated. Wall   thickness was normal. Systolic function was moderately reduced. - Right atrium: The atrium was severely dilated. - Tricuspid valve: There was severe regurgitation. - Pulmonic valve: There was mild regurgitation. - Pulmonary arteries: Systolic pressure was moderately increased.   PA peak pressure: 43 mm Hg (S). - Inferior vena cava: The vessel was dilated. The respirophasic   diameter changes were blunted (< 50%), consistent with elevated   central venous pressure. - Pericardium, extracardiac: There was no pericardial effusion.  Antibiotics:  None   HPI/Subjective: Afebrile, no CP, no nausea, no vomiting. Positive LE swelling (even improved) and described orthopnea.  Objective: Vitals:   09/20/17 0845 09/20/17 1242  BP: 120/66 123/67  Pulse: 67 69  Resp: 17   Temp: 97.9 F (36.6 C)   SpO2: 94%     Intake/Output Summary (Last 24 hours) at 09/20/17 1658 Last data filed at 09/20/17 1300  Gross per 24 hour  Intake              785 ml  Output             1200 ml  Net             -415 ml   Filed Weights   09/19/17 1620 09/20/17 0406  Weight: 93.2 kg (205 lb 6.4 oz) 93.1 kg (205 lb 4.8 oz)    Exam:   General:  No CP, no nausea or vomiting. Patient is  afebrile and breathing a lot better. Still with swelling on her leg and with orthopnea, but improved overall.  Cardiovascular: S1 and S2, no rubs, no gallops, mild JVD appreciate don exam  Respiratory: fair air movement, no wheezing, positive rhonchi, fine crackles at bases.   Abdomen: soft, NT, ND, positive BS  Musculoskeletal: 2-3++ edema appreciated; patient now with wrapping compression dressing in place.   Data Reviewed: Basic Metabolic Panel:  Recent Labs Lab 09/18/17 1810 09/19/17 0208 09/20/17 0337  NA 135 133* 134*  K 4.3 3.9 4.3  CL 99* 97* 98*  CO2 24 25 25   GLUCOSE 95 100* 110*  BUN 40* 40* 44*  CREATININE 1.06* 1.07* 1.21*  CALCIUM 9.3 9.0 9.0   CBC:  Recent Labs Lab 09/18/17 1810 09/20/17 1205  WBC 8.9 7.6  HGB 11.4* 11.6*  HCT 36.4 38.0  MCV 80.4 80.3  PLT 268 266   BNP (last 3 results)  Recent Labs  12/11/16 0959 04/15/17 1551 09/18/17 1810  BNP 1,146.2* 1,616.2* 2,268.0*    CBG:  Recent Labs Lab 09/19/17 1246 09/19/17 2216 09/20/17 0732 09/20/17 1215 09/20/17 1645  GLUCAP 79 111* 96 95 117*    Studies: Dg Chest 2 View  Result Date: 09/18/2017 CLINICAL DATA:  Increased shortness of breath and bilateral leg swelling. EXAM: CHEST  2 VIEW COMPARISON:  08/15/2017 FINDINGS: Stable cardiomegaly. Prior median sternotomy and evidence of previous CABG procedure. Lung markings are slightly prominent in the right upper lung and right hilar region. Difficult to exclude mild edema. Increased densities along the medial left chest base are similar to the previous examination of some of this could be chronic. Findings could also be associated small effusions. Negative for pneumothorax. IMPRESSION: Cardiomegaly and suspect mild pulmonary edema. Minimal change from the previous examination. Cannot exclude small pleural effusions. Electronically Signed   By: Markus Daft M.D.   On: 09/18/2017 18:38    Scheduled Meds: . furosemide  40 mg Intravenous BID   . insulin aspart  0-5 Units Subcutaneous QHS  . insulin aspart  0-9 Units Subcutaneous TID WC  . metolazone  2.5 mg Oral Daily  . metoprolol succinate  12.5 mg Oral Daily  . pantoprazole  40 mg Oral Daily  . potassium chloride  20 mEq Oral Daily  . sodium chloride flush  3 mL Intravenous Q12H  . traZODone  50 mg Oral Once  . warfarin  2.5 mg Oral ONCE-1800  . Warfarin - Pharmacist Dosing Inpatient   Does not apply q1800   Continuous Infusions: . sodium chloride       Time spent: 30 minutes    Barton Dubois  [facility] Pager [phone]. If 7PM-7AM, please contact night-coverage at [email or web address], password [facility] 09/20/2017, 4:58 PM  LOS: 1 day

## 2017-09-20 NOTE — Evaluation (Signed)
Physical Therapy Evaluation Patient Details Name: RUSSELL ENGELSTAD MRN: [id number] DOB: 08/22/1933 Today's Date: 09/20/2017   History of Present Illness  83 yo female with onset of pulm edema and effusion with admission for SOB and peripheral edema.  Has been treated for LE wounds and is expecting to continue with [facility] services.  PMHx:  DM, CHF, CAD with stent, HTN, HLD, a-fib, OA, spinal stenosis  Clinical Impression  Pt is up to walk with PT and noted her O2 sats remained at 98 to 99% despite her SOB and limited endurance.  Her plan is to get up to walk with help in hospital and will have HHPT follow her with caregiver assistance as previously received.  Her tolerance for activity was limited by SOB but not actual medical readings of compromise, and so will continue to work on strengthening and mobility as needed here.    Follow Up Recommendations Home health PT;Supervision for mobility/OOB    Equipment Recommendations  None recommended by PT    Recommendations for Other Services       Precautions / Restrictions Precautions Precautions: Fall (telemetry) Precaution Comments: ck O2 sats with effort Restrictions Weight Bearing Restrictions: No      Mobility  Bed Mobility Overal bed mobility: Needs Assistance Bed Mobility: Supine to Sit     Supine to sit: Min guard     General bed mobility comments: SOB with effort  Transfers Overall transfer level: Needs assistance Equipment used: 1 person hand held assist;4-wheeled walker Transfers: Sit to/from Stand Sit to Stand: Min assist;Mod assist;Min guard         General transfer comment: help to power up then can steady with min guard  Ambulation/Gait Ambulation/Gait assistance: Min guard;Min assist Ambulation Distance (Feet): 100 Feet Assistive device: 4-wheeled walker;1 person hand held assist Gait Pattern/deviations: Step-to pattern;Step-through pattern;Shuffle;Wide base of support;Trunk flexed;Drifts right/left Gait  velocity: reduced Gait velocity interpretation: Below normal speed for age/gender General Gait Details: pt is halting at times to rest and able to control her rollator well  Stairs            Wheelchair Mobility    Modified Rankin (Stroke Patients Only)       Balance Overall balance assessment: Needs assistance Sitting-balance support: Feet supported Sitting balance-Leahy Scale: Good     Standing balance support: Bilateral upper extremity supported;During functional activity Standing balance-Leahy Scale: Fair                               Pertinent Vitals/Pain Pain Assessment: No/denies pain    Home Living Family/patient expects to be discharged to:: Assisted living               Home Equipment: Walker - 2 wheels;Walker - 4 wheels;Cane - single point;Grab bars - toilet;Grab bars - tub/shower;Shower seat - built in Additional Comments: Pt is not using O2 at home and not involved in regular exercise    Prior Function Level of Independence: Independent with assistive device(s)         Comments: pt was driving and going out to pilates classes at [employer].  She was told there to use RW when going out in community     Hand Dominance   Dominant Hand: Right    Extremity/Trunk Assessment   Upper Extremity Assessment Upper Extremity Assessment: Generalized weakness    Lower Extremity Assessment Lower Extremity Assessment: Generalized weakness    Cervical / Trunk Assessment Cervical /  Trunk Assessment: Kyphotic  Communication   Communication: HOH  Cognition Arousal/Alertness: Awake/alert Behavior During Therapy: WFL for tasks assessed/performed Overall Cognitive Status: Within Functional Limits for tasks assessed                                 General Comments: pt is reasonable with her expectations and plans for therapy      General Comments      Exercises     Assessment/Plan    PT Assessment Patient  needs continued PT services  PT Problem List Decreased strength;Decreased range of motion;Decreased activity tolerance;Decreased balance;Decreased mobility;Decreased coordination;Decreased safety awareness;Cardiopulmonary status limiting activity;Obesity;Decreased skin integrity       PT Treatment Interventions DME instruction;Gait training;Functional mobility training;Therapeutic activities;Therapeutic exercise;Balance training;Neuromuscular re-education;Patient/family education    PT Goals (Current goals can be found in the Care Plan section)  Acute Rehab PT Goals Patient Stated Goal: to get home and get therapy started PT Goal Formulation: With patient Time For Goal Achievement: 10/04/17 Potential to Achieve Goals: Good    Frequency Min 3X/week   Barriers to discharge  (all level ALF ) has caregiver help 2x per week for several hours    Co-evaluation               AM-PAC PT "6 Clicks" Daily Activity  Outcome Measure Difficulty turning over in bed (including adjusting bedclothes, sheets and blankets)?: A Little Difficulty moving from lying on back to sitting on the side of the bed? : A Little Difficulty sitting down on and standing up from a chair with arms (e.g., wheelchair, bedside commode, etc,.)?: Unable       6 Click Score: 7    End of Session Equipment Utilized During Treatment: Gait belt Activity Tolerance: Patient limited by fatigue Patient left: in bed;with call bell/phone within reach Nurse Communication: Mobility status PT Visit Diagnosis: Unsteadiness on feet (R26.81);Muscle weakness (generalized) (M62.81);Other abnormalities of gait and mobility (R26.89)    Time: 6767-2094 PT Time Calculation (min) (ACUTE ONLY): 39 min   Charges:   PT Evaluation $PT Eval Moderate Complexity: 1 Mod PT Treatments $Gait Training: 8-22 mins $Therapeutic Exercise: 8-22 mins   PT G Codes:   PT G-Codes **NOT FOR INPATIENT CLASS** Functional Assessment Tool Used: AM-PAC  6 Clicks Basic Mobility    Ramond Dial 09/20/2017, 5:38 PM   Mee Hives, PT MS Acute Rehab Dept. Number: [place] and [place]

## 2017-09-20 NOTE — Progress Notes (Signed)
Pt c/o right hand redness/pain around knuckles.  Dr. Dyann Kief is aware.  No new orders at this time.
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
## 2017-09-20 NOTE — Consult Note (Signed)
   [facility] Inpatient Consult   09/20/2017  [name] 10-09-1933 [id number]   Received information from [facility] Liaison that this patient is active with [place] program through [place].  Will update inpatient of the ED RNCM, Camille's  notes regarding status as well. Will sign off for any [facility] Care Management needs.  For questions, please contact:  Natividad Brood, RN BSN [facility] Liaison  [phone] business mobile phone Toll free office [phone]

## 2017-09-20 NOTE — Consult Note (Signed)
[facility] a home based palliative care services provided by [facility]: Pt is alert oriented and eating. She states she feels so much better that th e high doses of medicines are really helping with her fluid build up. She states she is breathing better as well. She is receptive to having [place] return in home when she is discharged. We will continue to follow. Webb Silversmith RN [phone]

## 2017-09-21 DIAGNOSIS — E876 Hypokalemia: Secondary | ICD-10-CM

## 2017-09-21 DIAGNOSIS — M10241 Drug-induced gout, right hand: Secondary | ICD-10-CM

## 2017-09-21 LAB — BASIC METABOLIC PANEL
ANION GAP: 12 (ref 5–15)
BUN: 44 mg/dL — ABNORMAL HIGH (ref 6–20)
CALCIUM: 8.9 mg/dL (ref 8.9–10.3)
CO2: 24 mmol/L (ref 22–32)
CREATININE: 1.24 mg/dL — AB (ref 0.44–1.00)
Chloride: 98 mmol/L — ABNORMAL LOW (ref 101–111)
GFR calc Af Amer: 46 mL/min — ABNORMAL LOW (ref >60)
GFR, EST NON AFRICAN AMERICAN: 39 mL/min — AB (ref >60)
Glucose, Bld: 102 mg/dL — ABNORMAL HIGH (ref 65–99)
Potassium: 4 mmol/L (ref 3.5–5.1)
SODIUM: 134 mmol/L — AB (ref 135–145)

## 2017-09-21 LAB — GLUCOSE, CAPILLARY
GLUCOSE-CAPILLARY: 152 mg/dL — AB (ref 65–99)
Glucose-Capillary: 105 mg/dL — ABNORMAL HIGH (ref 65–99)
Glucose-Capillary: 84 mg/dL (ref 65–99)
Glucose-Capillary: 154 mg/dL — ABNORMAL HIGH (ref 65–99)

## 2017-09-21 LAB — CBC
HCT: 36.9 % (ref 36.0–46.0)
Hemoglobin: 11.3 g/dL — ABNORMAL LOW (ref 12.0–15.0)
MCH: 24.5 pg — ABNORMAL LOW (ref 26.0–34.0)
MCHC: 30.6 g/dL (ref 30.0–36.0)
MCV: 79.9 fL (ref 78.0–100.0)
PLATELETS: 224 10*3/uL (ref 150–400)
RBC: 4.62 MIL/uL (ref 3.87–5.11)
RDW: 19.7 % — AB (ref 11.5–15.5)
WBC: 9.4 10*3/uL (ref 4.0–10.5)

## 2017-09-21 LAB — PROTIME-INR
INR: 2.41
PROTHROMBIN TIME: 26 s — AB (ref 11.4–15.2)

## 2017-09-21 MED ORDER — TRAZODONE HCL 50 MG PO TABS: 50.0000 mg | ORAL_TABLET | Freq: Every evening | ORAL | Status: DC | PRN

## 2017-09-21 MED ORDER — WARFARIN SODIUM 5 MG PO TABS
5.0000 mg | ORAL_TABLET | Freq: Once | ORAL | Status: AC
  Administered 2017-09-21: 5 mg via ORAL
  Filled 2017-09-21: qty 1

## 2017-09-21 MED ORDER — PREDNISONE 20 MG PO TABS
30.0000 mg | ORAL_TABLET | Freq: Every day | ORAL | Status: DC
  Administered 2017-09-21 – 2017-09-22 (×2): 30 mg via ORAL
  Filled 2017-09-21 (×2): qty 1

## 2017-09-21 MED ORDER — PREDNISONE 20 MG PO TABS: 20.0000 mg | ORAL_TABLET | Freq: Every day | ORAL | Status: DC

## 2017-09-21 NOTE — Progress Notes (Signed)
[facility] PROGRESS NOTE  [name] DTO:[id number] DOB: 25-Jan-1933 DOA: 09/18/2017 PCP: [name], MD  Interim summary and HPI 83 y.o. female with medical history significant of diastolic CHF with pulmonary HTN on EKG in 2017, preserved EF.  CAD s/p stenting.  HTN.  Patient presents to the ED with c/o mild SOB and severe peripheral edema now with skin break down and fluid draining out of legs.  Symptoms ongoing and progressively worsening over last couple of months.  Has been taking lasix as directed but symptoms progressing despite treatment.  Assessment/Plan: 1-acute on chronic diastolic HF -patient has remained off O2 supplementation and is feeling a lot better. -will continue IV diuretics for another 24 hours and continue also metolazone   -continue daily weights and strict I's and O's  -continue current therapy and follow clinical response   2-HTN -stable overall  -will continue current antihypertensive drugs   3-type 2 diabetes: -will hold oral hypoglycemic regimen  -A1C 5.5 -continue SSI and modified carb diet while inpatient   4-A. Fib: PAF -continue metoprolol for rate control -continue coumadin per pharmacy  -CHADsVASC score 3-4  5-LEG swelling/whipping  -follow rec's from [place] service -continue diuresis  -wrapping compression therapy initiated   6-pulmonary HTN -might benefit use of revatio at discharge  -for now continue lasix therapy -patient educated in need of low sodium diet   7-GERD -will continue PPI  8-hypokalemia -will continue daily supplementation given active diuresis  -monitoring patient on telemetry   9-right hand gout: redness, swelling and tenderness over her knuckles overnight  -most likely associated with acute diuresis -will treat with 5-7 days of prednisone   Code Status: Full Family Communication: no family at bedside  Disposition Plan: continue IV diuretics; monitor on telemetry; no CP. Follow clinical response     Consultants:  None   Procedures:  2-D echo: - Left ventricle: The cavity size was normal. There was moderate   concentric hypertrophy. Systolic function was vigorous. The   estimated ejection fraction was in the range of 65% to 70%. Wall   motion was normal; there were no regional wall motion   abnormalities. - Ventricular septum: The contour showed diastolic flattening and   systolic flattening. - Aortic valve: Valve mobility was restricted. There was moderate   stenosis. Mean gradient (S): 24 mm Hg. Peak gradient (S): 40 mm   Hg. Valve area (VTI): 0.73 cm^2. Valve area (Vmax): 0.8 cm^2.   Valve area (Vmean): 0.71 cm^2. - Mitral valve: The findings are consistent with mild stenosis.   There was mild regurgitation. Mean gradient (D): 3 mm Hg. Valve   area by continuity equation (using LVOT flow): 1.05 cm^2. - Left atrium: The atrium was moderately dilated. - Right ventricle: The cavity size was severely dilated. Wall   thickness was normal. Systolic function was moderately reduced. - Right atrium: The atrium was severely dilated. - Tricuspid valve: There was severe regurgitation. - Pulmonic valve: There was mild regurgitation. - Pulmonary arteries: Systolic pressure was moderately increased.   PA peak pressure: 43 mm Hg (S). - Inferior vena cava: The vessel was dilated. The respirophasic   diameter changes were blunted (< 50%), consistent with elevated   central venous pressure. - Pericardium, extracardiac: There was no pericardial effusion.  Antibiotics:  None   HPI/Subjective: Afebrile, no CP and reporting significant improvement in her breathing. Still with swelling in her legs and reported SOB with exertion.  Objective: Vitals:   09/21/17 0443 09/21/17 0900  BP: [name])  123/59 (!) 101/57  Pulse: 67 (!) 56  Resp: 20 20  Temp: 98.6 F (37 C) 98.2 F (36.8 C)  SpO2: 94% 96%    Intake/Output Summary (Last 24 hours) at 09/21/17 1126 Last data filed at 09/21/17  1000  Gross per 24 hour  Intake             1545 ml  Output             1000 ml  Net              545 ml   Filed Weights   09/19/17 1620 09/20/17 0406 09/21/17 0443  Weight: 93.2 kg (205 lb 6.4 oz) 93.1 kg (205 lb 4.8 oz) 93.9 kg (207 lb 0.2 oz)    Exam:   General:  Afebrile, no CP, no nausea, no vomiting. Improved breathing and complaining of right hand swelling and pain. Still with signs of fluid overload in her legs.   Cardiovascular: rate controlled, no gallops, no rubs. No JVD on exam  Respiratory: improved air movement, no wheezing, no frank crackles; scattered rhonchi appreciated.    Abdomen: soft, NT, ND, positive BS  Musculoskeletal: 2+ edema bilaterally, no cyanosis, compressive wrapping dressing in place. Right hand with three knuckles swollen, red and tender to palpation. (2,3 and 4th MCP; most likely gout).   Data Reviewed: Basic Metabolic Panel:  Recent Labs Lab 09/18/17 1810 09/19/17 0208 09/20/17 0337 09/21/17 0554  NA 135 133* 134* 134*  K 4.3 3.9 4.3 4.0  CL 99* 97* 98* 98*  CO2 24 25 25 24   GLUCOSE 95 100* 110* 102*  BUN 40* 40* 44* 44*  CREATININE 1.06* 1.07* 1.21* 1.24*  CALCIUM 9.3 9.0 9.0 8.9   CBC:  Recent Labs Lab 09/18/17 1810 09/20/17 1205 09/21/17 0554  WBC 8.9 7.6 9.4  HGB 11.4* 11.6* 11.3*  HCT 36.4 38.0 36.9  MCV 80.4 80.3 79.9  PLT 268 266 224   BNP (last 3 results)  Recent Labs  12/11/16 0959 04/15/17 1551 09/18/17 1810  BNP 1,146.2* 1,616.2* 2,268.0*    CBG:  Recent Labs Lab 09/20/17 0732 09/20/17 1215 09/20/17 1645 09/20/17 2214 09/21/17 0743  GLUCAP 96 95 117* 106* 105*    Studies: No results found.  Scheduled Meds: . furosemide  40 mg Intravenous BID  . insulin aspart  0-5 Units Subcutaneous QHS  . insulin aspart  0-9 Units Subcutaneous TID WC  . metolazone  2.5 mg Oral Daily  . metoprolol succinate  12.5 mg Oral Daily  . pantoprazole  40 mg Oral Daily  . potassium chloride  20 mEq Oral Daily   . predniSONE  30 mg Oral Q breakfast  . sodium chloride flush  3 mL Intravenous Q12H  . Warfarin - Pharmacist Dosing Inpatient   Does not apply q1800   Continuous Infusions: . sodium chloride       Time spent: 30 minutes    Barton Dubois  [facility] Pager [phone]. If 7PM-7AM, please contact night-coverage at [email or web address], password [facility] 09/21/2017, 11:26 AM  LOS: 2 days
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
## 2017-09-21 NOTE — Progress Notes (Signed)
Patient continues to complain of pain and redness around knuckles.  Pain medication and heat packs provided. Patient stated that medication helped with pain.  Will follow up with day shift.  Davionna Blacksher, RN

## 2017-09-22 DIAGNOSIS — R0602 Shortness of breath: Secondary | ICD-10-CM

## 2017-09-22 DIAGNOSIS — I272 Pulmonary hypertension, unspecified: Secondary | ICD-10-CM

## 2017-09-22 DIAGNOSIS — M10241 Drug-induced gout, right hand: Secondary | ICD-10-CM

## 2017-09-22 LAB — CBC
HCT: 37.5 % (ref 36.0–46.0)
HEMOGLOBIN: 11.6 g/dL — AB (ref 12.0–15.0)
MCH: 24.5 pg — ABNORMAL LOW (ref 26.0–34.0)
MCHC: 30.9 g/dL (ref 30.0–36.0)
MCV: 79.3 fL (ref 78.0–100.0)
Platelets: 251 10*3/uL (ref 150–400)
RBC: 4.73 MIL/uL (ref 3.87–5.11)
RDW: 19.4 % — ABNORMAL HIGH (ref 11.5–15.5)
WBC: 11.1 10*3/uL — ABNORMAL HIGH (ref 4.0–10.5)

## 2017-09-22 LAB — BASIC METABOLIC PANEL
ANION GAP: 11 (ref 5–15)
BUN: 50 mg/dL — ABNORMAL HIGH (ref 6–20)
CHLORIDE: 97 mmol/L — AB (ref 101–111)
CO2: 25 mmol/L (ref 22–32)
Calcium: 9.3 mg/dL (ref 8.9–10.3)
Creatinine, Ser: 1.36 mg/dL — ABNORMAL HIGH (ref 0.44–1.00)
GFR calc non Af Amer: 35 mL/min — ABNORMAL LOW (ref >60)
GFR, EST AFRICAN AMERICAN: 41 mL/min — AB (ref >60)
Glucose, Bld: 114 mg/dL — ABNORMAL HIGH (ref 65–99)
POTASSIUM: 3.6 mmol/L (ref 3.5–5.1)
SODIUM: 133 mmol/L — AB (ref 135–145)

## 2017-09-22 LAB — GLUCOSE, CAPILLARY
GLUCOSE-CAPILLARY: 111 mg/dL — AB (ref 65–99)
Glucose-Capillary: 112 mg/dL — ABNORMAL HIGH (ref 65–99)

## 2017-09-22 LAB — PROTIME-INR
INR: 2.5
PROTHROMBIN TIME: 26.8 s — AB (ref 11.4–15.2)

## 2017-09-22 MED ORDER — FUROSEMIDE 40 MG PO TABS: 60.0000 mg | ORAL_TABLET | Freq: Two times a day (BID) | ORAL | 3 refills | Status: DC

## 2017-09-22 MED ORDER — WARFARIN SODIUM 5 MG PO TABS: 5.0000 mg | ORAL_TABLET | Freq: Once | ORAL | Status: DC

## 2017-09-22 MED ORDER — PREDNISONE 10 MG PO TABS: 30.0000 mg | ORAL_TABLET | Freq: Every day | ORAL | 0 refills | Status: DC

## 2017-09-22 MED ORDER — METOLAZONE 2.5 MG PO TABS: ORAL_TABLET | ORAL | 1 refills | Status: DC

## 2017-09-22 NOTE — Care Management Note (Addendum)
Case Management Note Marvetta Gibbons RN, BSN Unit 4E-Case Manager-- 3E- weekend coverage [phone]  Patient Details  Name: [name] MRN: [id number] Date of Birth: 1933/09/17  Subjective/Objective:  Pt admitted with acute on chronic HF                  Action/Plan: P[place] home [place]- referral for HHRN/PT- spoke with pt at bedside- per pt she has help from [facility] 2 mornings/week- also is followed by [facility]- choice offered for [facility] agency- per pt she does not have a preference list provided and she is ok with using [facility] for [facility] services- referral called to jermaine with [facility] for HHRN/PT- pt reports that her son is coming from [place] to stay with her a few days and she has no DME needs.   Expected Discharge Date:  09/22/17               Expected Discharge Plan:  [place]  In-House Referral:     Discharge planning Services  CM Consult  Post Acute Care Choice:  Home Health Choice offered to:  Patient  DME Arranged:  N/A DME Agency:  NA  HH Arranged:  RN, Disease Management, PT [place] Agency:  [place]  Status of Service:  Completed, signed off  If discussed at [place] of Stay Meetings, dates discussed:    Discharge Disposition: Home/home health  Additional Comments:  09/26/17- 0930- Marvetta Gibbons RN, CM- post discharge note-  received notice from [place] with [facility] that they had received call from pt that she wanted [facility] for [facility] services - pt wants [facility] canceled and [facility] to pick up [facility] services per Dian Situ  they are working with Dr. Yong Channel to make this happen and will start providing services for [facility] per pt request.   Dawayne Patricia, RN 09/22/2017, 1:17 PM
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
## 2017-09-22 NOTE — Discharge Summary (Signed)
Physician Discharge Summary  [name] XIP:[id number] DOB: 11/04/1933 DOA: 09/18/2017  PCP: [name], MD  Admit date: 09/18/2017 Discharge date: 09/22/2017  Time spent: 35 minutes  Recommendations for Outpatient Follow-up:  Repeat BMET to follow electrolytes and renal function  Reassess patient volume and adjust diuretic regimen as needed  Follow resolution of acute gout event in her right hand; will need uric acid level and probably initiation of allopurinol.  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 Patient discharge back to independent living at [facility] with arranged [place] services.  Diet recommendation: heart healthy and modified carbohydrates diet.  Filed Weights   09/20/17 0406 09/21/17 0443 09/22/17 0544  Weight: 93.1 kg (205 lb 4.8 oz) 93.9 kg (207 lb 0.2 oz) 92.5 kg (203 lb 14.4 oz)    History of present illness:  83 y.o.femalewith medical history significant of diastolic CHF with pulmonary HTN on EKG in 2017, preserved EF. CAD s/p stenting. HTN. Patient presents to the ED with c/o mild SOB and severe peripheral edema now with skin break down and fluid draining out of legs. Symptoms ongoing and progressively worsening over last couple of months. Has been taking lasix as directed but symptoms progressing despite treatment.  Hospital Course:  1-acute on chronic diastolic HF -patient has remained off O2 supplementation and is feeling/breathing a lot better. -will discharge on adjusted dose of lasix and weekly use of metolazone/PRN use of it for extra weight gain as instructed. -continue daily weights and low sodium diet    2-HTN -has remained stable  overall  -will continue current antihypertensive drugs  -heart healthy diet advised   3-type 2 diabetes: -will resume home hypoglycemic regimen -patient advise to watch amount of carbohydrates intake -A1C 5.5 -might consider discontinuation of oral hypoglycemics if diet control is enough.   4-A. Fib: PAF -continue metoprolol for rate control -continue coumadin  -INR therapeutic at discharge -CHADsVASC score 3-4  5-LEG swelling/whipping  -follow recommendations from [place] service -continue diuresis  -wrapping compression therapy initiated  -HHRN arranged to continue wound care  6-pulmonary HTN -might benefit use of revatio at discharge; please reassess and if needed make referal to pulmonary service. -for now continue lasix therapy -patient educated in need of low sodium diet   7-GERD -will continue PPI  8-hypokalemia -will continue daily supplementation given continue use of diuretics  -recommend repeating BMET at follow to reassess electrolytes.  9-right hand gout: redness, swelling and tenderness over her knuckles overnight  -most likely associated with acute diuresis -will treat with prednisone   10-physical deconditioning  -evaluated by PT and following recommendations discharge back to previous environment with [facility] services.  Procedures:  2-D echo: - Left ventricle: The cavity size was normal. There was moderate concentric hypertrophy. Systolic function was vigorous. The estimated ejection fraction was in the range of 65% to 70%. Wall motion was normal; there were no regional wall motion abnormalities. - Ventricular septum: The contour showed diastolic flattening and systolic flattening. - Aortic valve: Valve mobility was restricted. There was moderate stenosis. Mean gradient (S): 24 mm Hg. Peak gradient (S): 40 mm Hg. Valve area (VTI): 0.73 cm^2. Valve area (Vmax): 0.8 cm^2. Valve area (Vmean): 0.71 cm^2. - Mitral valve: The  findings  are consistent with mild stenosis. There was mild regurgitation. Mean gradient (D): 3 mm Hg. Valve area by continuity equation (using LVOT flow): 1.05 cm^2. - Left atrium: The atrium was moderately dilated. - Right ventricle: The cavity size was severely dilated. Wall thickness was normal. Systolic function was moderately reduced. - Right atrium: The atrium was severely dilated. - Tricuspid valve: There was severe regurgitation. - Pulmonic valve: There was mild regurgitation. - Pulmonary arteries: Systolic pressure was moderately increased. PA peak pressure: 43 mm Hg (S). - Inferior vena cava: The vessel was dilated. The respirophasic diameter changes were blunted (<50%), consistent with elevated central venous pressure. - Pericardium, extracardiac: There was no pericardial effusion.   Consultations:  None   Discharge Exam: Vitals:   09/21/17 1921 09/22/17 0544  BP: 134/77 118/73  Pulse: 87 74  Resp: 20 20  Temp: 98.2 F (36.8 C) 98.3 F (36.8 C)  SpO2: 99% 98%    General:  Afebrile, no CP, no nausea, no vomiting. Improved breathing and also dramatic improvement in her right hand swelling and pain. Still with some swelling on her legs, but much improved.   Cardiovascular: rate controlled, no gallops, no rubs. No JVD on exam  Respiratory: improved air movement, no wheezing, no frank crackles; scattered rhonchi appreciated.    Abdomen: soft, NT, ND, positive BS  Musculoskeletal: 1+ edema bilaterally, no cyanosis, compressive wrapping dressing in place. Right hand with significant improvement in her MCP joint swelling, erythema and tenderness.    Discharge Instructions   Discharge Instructions    (HEART FAILURE PATIENTS) Call MD:  Anytime you have any of the following symptoms: 1) 3 pound weight gain in 24 hours or 5 pounds in 1 week 2) shortness of breath, with or without a dry hacking cough 3) swelling in the hands, feet or stomach 4) if you have to  sleep on extra pillows at night in order to breathe.    Complete by:  As directed    Diet - low sodium heart healthy    Complete by:  As directed    Discharge instructions    Complete by:  As directed    Take medicaitons as prescribed Changed dressing once a day  Low sodium diet (less than 2 gram daily) Follow up with PCP in 10 days Maintain adequate hydration     Current Discharge Medication List    START taking these medications   Details  metolazone (ZAROXOLYN) 2.5 MG tablet Once a week on Wednesday; or extra if needed with weight gain Qty: 30 tablet, Refills: 1    predniSONE (DELTASONE) 10 MG tablet Take 3 tablets (30 mg total) by mouth daily with breakfast. Qty: 18 tablet, Refills: 0      CONTINUE these medications which have CHANGED   Details  furosemide (LASIX) 40 MG tablet Take 1.5 tablets (60 mg total) by mouth 2 (two) times daily. Qty: 90 tablet, Refills: 3      CONTINUE these medications which have NOT CHANGED   Details  clobetasol cream (TEMOVATE) 0.05 % APPLY NIGHTLY AS NEEDED FOR IRRITATION. 7 days in a row maximum Qty: 60 g, Refills: 0    loratadine (CLARITIN) 10 MG tablet Take 10 mg by mouth daily as needed for allergies.    metFORMIN (GLUCOPHAGE) 500 MG tablet TAKE ONE TABLET BY MOUTH EVERY MORNING WITH BREAKFAST Qty: 90 tablet, Refills: 3    metoprolol succinate (TOPROL-XL) 25 MG 24 hr tablet Take 0.5 tablets (12.5 mg total) by mouth daily.  Qty: 90 tablet, Refills: 3    nitroGLYCERIN (NITROSTAT) 0.4 MG SL tablet Place 1 tablet (0.4 mg total) under the tongue every 5 (five) minutes as needed. For chest pain Qty: 25 tablet, Refills: 2   Associated Diagnoses: Coronary artery disease involving native coronary artery of native heart without angina pectoris    omeprazole (PRILOSEC) 20 MG capsule Take 1 capsule (20 mg total) by mouth daily. Qty: 90 capsule, Refills: 3    potassium chloride (K-DUR) 10 MEQ tablet Take 2 tablets (20 mEq total) by mouth as  directed. Qty: 180 tablet, Refills: 3    warfarin (COUMADIN) 5 MG tablet TAKE 1/2 TO 1 TABLET DAILY AS DIRECTED BY COUMADIN CLINIC. Qty: 30 tablet, Refills: 1       Allergies  Allergen Reactions  . Ace Inhibitors Other (See Comments)    Intolerance per Dr. Doug Sou note  . Amlodipine Other (See Comments)    Intolerance per Dr. Doug Sou note   . Statins Other (See Comments)    Muscle soreness  . Sulfa Drugs Cross Reactors Itching  . Zetia [Ezetimibe] Other (See Comments)    Muscle soreness  . Fenofibrate Other (See Comments)    Muscle soreness   Follow-up Information    [name], MD. Schedule an appointment as soon as possible for a visit in 10 day(s).   Specialty:  Family Medicine Contact information: [address] [place] [address] [phone]            The results of significant diagnostics from this hospitalization (including imaging, microbiology, ancillary and laboratory) are listed below for reference.    Significant Diagnostic Studies: Dg Chest 2 View  Result Date: 09/18/2017 CLINICAL DATA:  Increased shortness of breath and bilateral leg swelling. EXAM: CHEST  2 VIEW COMPARISON:  08/15/2017 FINDINGS: Stable cardiomegaly. Prior median sternotomy and evidence of previous CABG procedure. Lung markings are slightly prominent in the right upper lung and right hilar region. Difficult to exclude mild edema. Increased densities along the medial left chest base are similar to the previous examination of some of this could be chronic. Findings could also be associated small effusions. Negative for pneumothorax. IMPRESSION: Cardiomegaly and suspect mild pulmonary edema. Minimal change from the previous examination. Cannot exclude small pleural effusions. Electronically Signed   By: Markus Daft M.D.   On: 09/18/2017 18:38    Microbiology: No results found for this or any previous visit (from the past 240 hour(s)).   Labs: Basic Metabolic  Panel:  Recent Labs Lab 09/18/17 1810 09/19/17 0208 09/20/17 0337 09/21/17 0554 09/22/17 0711  NA 135 133* 134* 134* 133*  K 4.3 3.9 4.3 4.0 3.6  CL 99* 97* 98* 98* 97*  CO2 24 25 25 24 25   GLUCOSE 95 100* 110* 102* 114*  BUN 40* 40* 44* 44* 50*  CREATININE 1.06* 1.07* 1.21* 1.24* 1.36*  CALCIUM 9.3 9.0 9.0 8.9 9.3   Liver Function Tests: No results for input(s): AST, ALT, ALKPHOS, BILITOT, PROT, ALBUMIN in the last 168 hours. No results for input(s): LIPASE, AMYLASE in the last 168 hours. No results for input(s): AMMONIA in the last 168 hours. CBC:  Recent Labs Lab 09/18/17 1810 09/20/17 1205 09/21/17 0554 09/22/17 0711  WBC 8.9 7.6 9.4 11.1*  HGB 11.4* 11.6* 11.3* 11.6*  HCT 36.4 38.0 36.9 37.5  MCV 80.4 80.3 79.9 79.3  PLT 268 266 224 251   Cardiac Enzymes: No results for input(s): CKTOTAL, CKMB, CKMBINDEX, TROPONINI in the last 168 hours. BNP:  BNP (last 3 results)  Recent Labs  12/11/16 0959 04/15/17 1551 09/18/17 1810  BNP 1,146.2* 1,616.2* 2,268.0*    ProBNP (last 3 results) No results for input(s): PROBNP in the last 8760 hours.  CBG:  Recent Labs Lab 09/21/17 1149 09/21/17 1705 09/21/17 2053 09/22/17 0716 09/22/17 1157  GLUCAP 84 154* 152* 111* 112*       Signed:  Barton Dubois MD.  [facility] 09/22/2017, 1:00 PM

## 2017-09-22 NOTE — Progress Notes (Signed)
Patient HR went down to 38. Patient asymptomatic. VS stable. Will continue to monitor.  Alphonsine Minium, RN

## 2017-09-22 NOTE — Progress Notes (Signed)
Patient given discharge instructions and all questions answered.  Patient discharged via wheelchair with all belongings.   

## 2017-09-23 ENCOUNTER — Telehealth: Payer: Self-pay | Admitting: *Deleted

## 2017-09-23 NOTE — Telephone Encounter (Signed)
[facility] called & stated that the pt had been in the hospital & that she was unable to get INR done today because she could not walk down to see them. Advised to have the pt come later this week.  Then, [facility] called & stated that they fill her pillbox & pt has been put on prednisone 30mg  daily. Advised that the med can interfere with Coumadin & INR will need to be check sooner this week on Wednesday. Also, gave instructions for pt to take 2.5mg  today & tomorrow then have INR checked on Wednesday before taking Coumadin. Faxed over an order to have INR checked on Wednesday to [facility].

## 2017-09-24 ENCOUNTER — Telehealth: Payer: Self-pay | Admitting: *Deleted

## 2017-09-24 NOTE — Telephone Encounter (Signed)
Noted thanks  Under 10 days you do not have to taper prednisone. It appears she is supposed to take 3 tabs daily so the 18 should only last for 6 days

## 2017-09-24 NOTE — Telephone Encounter (Signed)
Transition Care Management Follow-up Telephone Call  Per Discharge Summary: Admit date: 09/18/2017 Discharge date: 09/22/2017  Recommendations for Outpatient Follow-up:  Repeat BMET to follow electrolytes and renal function  Reassess patient volume and adjust diuretic regimen as needed  Follow resolution of acute gout event in her right hand; will need uric acid level and probably initiation of allopurinol.  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 Patient discharge back to independent living at [facility] with arranged [place] services.  Diet recommendation: heart healthy and modified carbohydrates diet.  --   How have you been since you were released from the hospital? "You know I've done pretty well. I've had some help. My son came and then I have a caretaker. So I've coped. I'm by myself now but I'm fine."   Do you understand why you were in the hospital? yes   Do you understand the discharge instructions? yes   Where were you discharged to? Home   Items Reviewed:  Medications reviewed: yes  Allergies reviewed: yes  Dietary changes reviewed: no, none made per patient  Referrals reviewed: yes, home health   Functional Questionnaire:   Activities of Daily Living (ADLs):   She states they are independent in the following: ambulation, feeding, continence, grooming, toileting and dressing States they require assistance with the following: bathing and hygiene   Any transportation issues/concerns?: no, children will drive her   Any patient concerns? Yes, should prednisone be a taper? It was prescribed 1 tab PO daily, #18 from the hospital She has been taking  as prescribed. Patient would like to know when she should stop taking. Please advise.   Confirmed importance and date/time of follow-up visits scheduled yes  Provider Appointment booked with Dr. [name] 09/30/17 @ 2:45pm. Patient notified appointment will be at [place] location.  Confirmed with patient if condition begins to worsen call PCP or go to the ER.  Patient was given the office number and encouraged to call back with question or concerns.  : yes

## 2017-09-25 ENCOUNTER — Telehealth: Payer: Self-pay | Admitting: *Deleted

## 2017-09-25 ENCOUNTER — Ambulatory Visit (INDEPENDENT_AMBULATORY_CARE_PROVIDER_SITE_OTHER): Payer: Medicare Other | Admitting: *Deleted

## 2017-09-25 ENCOUNTER — Telehealth: Payer: Self-pay | Admitting: Family Medicine

## 2017-09-25 ENCOUNTER — Telehealth: Payer: Self-pay | Admitting: Cardiology

## 2017-09-25 DIAGNOSIS — I4891 Unspecified atrial fibrillation: Secondary | ICD-10-CM | POA: Diagnosis not present

## 2017-09-25 DIAGNOSIS — I482 Chronic atrial fibrillation, unspecified: Secondary | ICD-10-CM

## 2017-09-25 LAB — POCT INR: INR: 2.2

## 2017-09-25 NOTE — Telephone Encounter (Signed)
Spoke with Joelene Millin at [facility] and she states that [name] will be able to keep her appt in the coumadin clinic today

## 2017-09-25 NOTE — Telephone Encounter (Signed)
Spoke with pt and because [facility] does not do labs except on  Mondays and Thursdays called to see if she can come to coumadin clinic today  and she states that her family has gone home and she has no one to bring her So called and spoke with Joelene Millin at [facility] and she states that they  have an availability around 1 to 130 today with transportation so made her an appt to be seen today

## 2017-09-25 NOTE — Telephone Encounter (Signed)
New message    Pt is asking for a call back.

## 2017-09-25 NOTE — Telephone Encounter (Signed)
° ° ° °  [name] with [name] said they were suppose to see pt today but pt didn ot want them to come today so they will see her tomorrow

## 2017-09-25 NOTE — Telephone Encounter (Signed)
Pt notified of instructions and verbalized understanding. 

## 2017-09-25 NOTE — Telephone Encounter (Signed)
Tried to call pt back but her phone remains busy  Called and placed on [employer] at [facility] and asked if she was able to go to her home and inform her of her appt today in the coumadin clinic and asked for return call back to confirm this

## 2017-09-26 ENCOUNTER — Telehealth: Payer: Self-pay | Admitting: Cardiology

## 2017-09-26 DIAGNOSIS — L97222 Non-pressure chronic ulcer of left calf with fat layer exposed: Secondary | ICD-10-CM | POA: Diagnosis not present

## 2017-09-26 DIAGNOSIS — S91105D Unspecified open wound of left lesser toe(s) without damage to nail, subsequent encounter: Secondary | ICD-10-CM | POA: Diagnosis not present

## 2017-09-26 DIAGNOSIS — I83222 Varicose veins of left lower extremity with both ulcer of calf and inflammation: Secondary | ICD-10-CM | POA: Diagnosis not present

## 2017-09-26 DIAGNOSIS — L97212 Non-pressure chronic ulcer of right calf with fat layer exposed: Secondary | ICD-10-CM | POA: Diagnosis not present

## 2017-09-26 DIAGNOSIS — I83212 Varicose veins of right lower extremity with both ulcer of calf and inflammation: Secondary | ICD-10-CM | POA: Diagnosis not present

## 2017-09-26 DIAGNOSIS — R238 Other skin changes: Secondary | ICD-10-CM | POA: Diagnosis not present

## 2017-09-26 NOTE — Telephone Encounter (Signed)
New message     Resuming care post hospital : bp 84/68 no symptoms , no orthostatic change
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
## 2017-09-27 ENCOUNTER — Encounter: Payer: Self-pay | Admitting: Cardiology

## 2017-09-27 ENCOUNTER — Ambulatory Visit (INDEPENDENT_AMBULATORY_CARE_PROVIDER_SITE_OTHER): Payer: Medicare Other | Admitting: Cardiology

## 2017-09-27 VITALS — BP 100/78 | HR 72 | Ht 68.0 in | Wt 203.2 lb

## 2017-09-27 DIAGNOSIS — I5033 Acute on chronic diastolic (congestive) heart failure: Secondary | ICD-10-CM | POA: Diagnosis not present

## 2017-09-27 DIAGNOSIS — I10 Essential (primary) hypertension: Secondary | ICD-10-CM

## 2017-09-27 DIAGNOSIS — I83222 Varicose veins of left lower extremity with both ulcer of calf and inflammation: Secondary | ICD-10-CM | POA: Diagnosis not present

## 2017-09-27 DIAGNOSIS — L97212 Non-pressure chronic ulcer of right calf with fat layer exposed: Secondary | ICD-10-CM | POA: Diagnosis not present

## 2017-09-27 DIAGNOSIS — L97222 Non-pressure chronic ulcer of left calf with fat layer exposed: Secondary | ICD-10-CM | POA: Diagnosis not present

## 2017-09-27 DIAGNOSIS — I6523 Occlusion and stenosis of bilateral carotid arteries: Secondary | ICD-10-CM

## 2017-09-27 DIAGNOSIS — S91105D Unspecified open wound of left lesser toe(s) without damage to nail, subsequent encounter: Secondary | ICD-10-CM | POA: Diagnosis not present

## 2017-09-27 DIAGNOSIS — I83212 Varicose veins of right lower extremity with both ulcer of calf and inflammation: Secondary | ICD-10-CM | POA: Diagnosis not present

## 2017-09-27 DIAGNOSIS — I482 Chronic atrial fibrillation, unspecified: Secondary | ICD-10-CM

## 2017-09-27 DIAGNOSIS — R238 Other skin changes: Secondary | ICD-10-CM | POA: Diagnosis not present

## 2017-09-27 DIAGNOSIS — I25810 Atherosclerosis of coronary artery bypass graft(s) without angina pectoris: Secondary | ICD-10-CM

## 2017-09-27 NOTE — Patient Instructions (Signed)
Take lasix 80 mg in the morning and 40 mg in the late afternoon.  Take metolazone 2.5 mg once a week on Wednesday morning.   Use a pill organizer  Weigh daily. If you gain more than 3 lbs in a day of 5 lbs in a week call me  Avoid salt.

## 2017-09-27 NOTE — Telephone Encounter (Signed)
noted 

## 2017-09-27 NOTE — Telephone Encounter (Signed)
Patient had appointment with Dr.Jordan 09/27/17.

## 2017-09-30 ENCOUNTER — Telehealth: Payer: Self-pay | Admitting: Family Medicine

## 2017-09-30 ENCOUNTER — Ambulatory Visit (INDEPENDENT_AMBULATORY_CARE_PROVIDER_SITE_OTHER): Payer: Medicare Other | Admitting: Pharmacist Clinician (PhC)/ Clinical Pharmacy Specialist

## 2017-09-30 ENCOUNTER — Ambulatory Visit (INDEPENDENT_AMBULATORY_CARE_PROVIDER_SITE_OTHER): Payer: Medicare Other | Admitting: Family Medicine

## 2017-09-30 ENCOUNTER — Encounter: Payer: Self-pay | Admitting: Family Medicine

## 2017-09-30 VITALS — BP 98/76 | HR 60 | Temp 98.2°F | Ht 68.0 in | Wt 203.2 lb

## 2017-09-30 DIAGNOSIS — Z23 Encounter for immunization: Secondary | ICD-10-CM

## 2017-09-30 DIAGNOSIS — M10241 Drug-induced gout, right hand: Secondary | ICD-10-CM | POA: Diagnosis not present

## 2017-09-30 DIAGNOSIS — I482 Chronic atrial fibrillation, unspecified: Secondary | ICD-10-CM

## 2017-09-30 DIAGNOSIS — I4891 Unspecified atrial fibrillation: Secondary | ICD-10-CM | POA: Diagnosis not present

## 2017-09-30 DIAGNOSIS — Z7901 Long term (current) use of anticoagulants: Secondary | ICD-10-CM | POA: Diagnosis not present

## 2017-09-30 DIAGNOSIS — L97212 Non-pressure chronic ulcer of right calf with fat layer exposed: Secondary | ICD-10-CM | POA: Diagnosis not present

## 2017-09-30 DIAGNOSIS — I5032 Chronic diastolic (congestive) heart failure: Secondary | ICD-10-CM | POA: Diagnosis not present

## 2017-09-30 DIAGNOSIS — R238 Other skin changes: Secondary | ICD-10-CM | POA: Diagnosis not present

## 2017-09-30 DIAGNOSIS — I272 Pulmonary hypertension, unspecified: Secondary | ICD-10-CM

## 2017-09-30 DIAGNOSIS — I83222 Varicose veins of left lower extremity with both ulcer of calf and inflammation: Secondary | ICD-10-CM | POA: Diagnosis not present

## 2017-09-30 DIAGNOSIS — I83212 Varicose veins of right lower extremity with both ulcer of calf and inflammation: Secondary | ICD-10-CM | POA: Diagnosis not present

## 2017-09-30 DIAGNOSIS — L97222 Non-pressure chronic ulcer of left calf with fat layer exposed: Secondary | ICD-10-CM | POA: Diagnosis not present

## 2017-09-30 DIAGNOSIS — S91105D Unspecified open wound of left lesser toe(s) without damage to nail, subsequent encounter: Secondary | ICD-10-CM | POA: Diagnosis not present

## 2017-09-30 DIAGNOSIS — I10 Essential (primary) hypertension: Secondary | ICD-10-CM

## 2017-09-30 DIAGNOSIS — I2510 Atherosclerotic heart disease of native coronary artery without angina pectoris: Secondary | ICD-10-CM | POA: Diagnosis not present

## 2017-09-30 DIAGNOSIS — M79641 Pain in right hand: Secondary | ICD-10-CM

## 2017-09-30 DIAGNOSIS — E1139 Type 2 diabetes mellitus with other diabetic ophthalmic complication: Secondary | ICD-10-CM | POA: Diagnosis not present

## 2017-09-30 LAB — PROTIME-INR: INR: 1.4 — AB (ref 0.9–1.1)

## 2017-09-30 NOTE — Assessment & Plan Note (Signed)
Diastolic CHF- continue 80mg  lasix AM, 40mg  PM and metolazone once a week on wednesdays. Weight remaining down as well as symptoms

## 2017-09-30 NOTE — Patient Instructions (Addendum)
Flu shot today  Please stop by lab before you go  Continue lasix 80mg  in the morning and 40mg  in the afternoon with metolazone once a week on wednesdays. Let us know if your weight goes up more than 3 lbs in a day or 5 lbs in 3 days or if your shortness of breath increases.   For gout- check labs- you declined prednisone again for now- I may use alternate medicine after seeing your #s to try to reduce uric acid in your blood (which causes gout) as well as another medicine to reduce current pain.

## 2017-09-30 NOTE — Telephone Encounter (Signed)
Langley Gauss, home health nurse with [name] suggests the patient receiving leg compressions today.  Also, her right hand (right 3 knuckles) are red and puffy due to hitting them against the door from losing her balance today (not falling).   Her last INR was 1.4 so the coumadin is increased to 7.5 Monday and Tuesday. Patient goes back to normal amount on Wednesday and ongoing. Her next test will be Monday.   Please advise during appointment today at 2:45pm.

## 2017-09-30 NOTE — Assessment & Plan Note (Signed)
Diabetes- considered stopping metformin but will repeat a1c in 3-4 months and if remains below 6 at that time consider stopping.

## 2017-09-30 NOTE — Telephone Encounter (Signed)
Called and provided verbal orders for PT

## 2017-09-30 NOTE — Assessment & Plan Note (Signed)
A. Fib- metoprolol for rate control. Compliant with coumadin.
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
## 2017-09-30 NOTE — Telephone Encounter (Signed)
Liji from [facility] needs a verbal order for physical therapy 3 times for 2 weeks, 2 time for 3 weeks and 1 time for 1 week. Please call [phone].

## 2017-09-30 NOTE — Telephone Encounter (Signed)
Noted thanks °
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
## 2017-09-30 NOTE — Assessment & Plan Note (Signed)
HTN- continue lasix, metoprolol 12.5 mg XL. Main treatment here is treating excess fluid status.

## 2017-09-30 NOTE — Assessment & Plan Note (Signed)
Pulmonary hypertension- she wants to focus on recovery from current illness. Consider pulmonary referral at future date. Could be related to diastolic CHF

## 2017-10-01 ENCOUNTER — Other Ambulatory Visit: Payer: Self-pay | Admitting: Family Medicine

## 2017-10-01 DIAGNOSIS — L97222 Non-pressure chronic ulcer of left calf with fat layer exposed: Secondary | ICD-10-CM | POA: Diagnosis not present

## 2017-10-01 DIAGNOSIS — L97212 Non-pressure chronic ulcer of right calf with fat layer exposed: Secondary | ICD-10-CM | POA: Diagnosis not present

## 2017-10-01 DIAGNOSIS — R238 Other skin changes: Secondary | ICD-10-CM | POA: Diagnosis not present

## 2017-10-01 DIAGNOSIS — S91105D Unspecified open wound of left lesser toe(s) without damage to nail, subsequent encounter: Secondary | ICD-10-CM | POA: Diagnosis not present

## 2017-10-01 DIAGNOSIS — I83222 Varicose veins of left lower extremity with both ulcer of calf and inflammation: Secondary | ICD-10-CM | POA: Diagnosis not present

## 2017-10-01 DIAGNOSIS — I83212 Varicose veins of right lower extremity with both ulcer of calf and inflammation: Secondary | ICD-10-CM | POA: Diagnosis not present

## 2017-10-01 LAB — CBC WITH DIFFERENTIAL/PLATELET
BASOS PCT: 0.7 % (ref 0.0–3.0)
Basophils Absolute: 0.1 10*3/uL (ref 0.0–0.1)
EOS PCT: 0.1 % (ref 0.0–5.0)
Eosinophils Absolute: 0 10*3/uL (ref 0.0–0.7)
HEMATOCRIT: 37.8 % (ref 36.0–46.0)
HEMOGLOBIN: 11.7 g/dL — AB (ref 12.0–15.0)
LYMPHS PCT: 3.9 % — AB (ref 12.0–46.0)
Lymphs Abs: 0.5 10*3/uL — ABNORMAL LOW (ref 0.7–4.0)
MCHC: 31.1 g/dL (ref 30.0–36.0)
MCV: 80.3 fl (ref 78.0–100.0)
MONOS PCT: 7.1 % (ref 3.0–12.0)
Monocytes Absolute: 0.9 10*3/uL (ref 0.1–1.0)
NEUTROS ABS: 11 10*3/uL — AB (ref 1.4–7.7)
Neutrophils Relative %: 88.2 % — ABNORMAL HIGH (ref 43.0–77.0)
PLATELETS: 279 10*3/uL (ref 150.0–400.0)
RBC: 4.71 Mil/uL (ref 3.87–5.11)
RDW: 20 % — AB (ref 11.5–15.5)
WBC: 12.5 10*3/uL — AB (ref 4.0–10.5)

## 2017-10-01 LAB — BASIC METABOLIC PANEL
BUN: 75 mg/dL — ABNORMAL HIGH (ref 6–23)
CHLORIDE: 95 meq/L — AB (ref 96–112)
CO2: 29 meq/L (ref 19–32)
Calcium: 9.5 mg/dL (ref 8.4–10.5)
Creatinine, Ser: 1.2 mg/dL (ref 0.40–1.20)
GFR: 45.69 mL/min — ABNORMAL LOW (ref >60.00)
Glucose, Bld: 107 mg/dL — ABNORMAL HIGH (ref 70–99)
POTASSIUM: 4 meq/L (ref 3.5–5.1)
SODIUM: 137 meq/L (ref 135–145)

## 2017-10-01 LAB — URIC ACID: Uric Acid, Serum: 12 mg/dL — ABNORMAL HIGH (ref 2.4–7.0)

## 2017-10-01 MED ORDER — FEBUXOSTAT 40 MG PO TABS: 40.0000 mg | ORAL_TABLET | Freq: Every day | ORAL | 5 refills | Status: DC

## 2017-10-01 MED ORDER — COLCHICINE 0.6 MG PO TABS: ORAL_TABLET | ORAL | 2 refills | Status: DC

## 2017-10-02 ENCOUNTER — Telehealth: Payer: Self-pay | Admitting: *Deleted

## 2017-10-02 ENCOUNTER — Telehealth: Payer: Self-pay | Admitting: Cardiology

## 2017-10-02 DIAGNOSIS — L97222 Non-pressure chronic ulcer of left calf with fat layer exposed: Secondary | ICD-10-CM | POA: Diagnosis not present

## 2017-10-02 DIAGNOSIS — I83212 Varicose veins of right lower extremity with both ulcer of calf and inflammation: Secondary | ICD-10-CM | POA: Diagnosis not present

## 2017-10-02 DIAGNOSIS — S91105D Unspecified open wound of left lesser toe(s) without damage to nail, subsequent encounter: Secondary | ICD-10-CM | POA: Diagnosis not present

## 2017-10-02 DIAGNOSIS — L97212 Non-pressure chronic ulcer of right calf with fat layer exposed: Secondary | ICD-10-CM | POA: Diagnosis not present

## 2017-10-02 DIAGNOSIS — R238 Other skin changes: Secondary | ICD-10-CM | POA: Diagnosis not present

## 2017-10-02 DIAGNOSIS — I83222 Varicose veins of left lower extremity with both ulcer of calf and inflammation: Secondary | ICD-10-CM | POA: Diagnosis not present

## 2017-10-02 NOTE — Telephone Encounter (Signed)
[name] from [facility]  is calling in reference to the patient. [name] is having some weight / fluid gain and [name] wants to give a report to wither the nurse or Dr. Yong Channel. [name] is requesting a call  back . Her contact number is [phone]. Thank you

## 2017-10-02 NOTE — Telephone Encounter (Signed)
New message    Langley Gauss Nurse  from [facility] @  [phone], states please call regarding 8lb weight gain since Monday.   1) How much weight have you gained and in what time span? 8lbs gained since 10/1  2) If swelling, where is the swelling located? Both legs swollen and weeping  3) Are you currently taking a fluid pill? metolazone (ZAROXOLYN) 2.5 MG tablet  4) Are you currently SOB? No  5) Do you have a log of your daily weights (if so, list)? 203.8lbs late morning down to 202 this afternoon  6) Have you gained 3 pounds in a day or 5 pounds in a week? yes  Have you traveled recently? NO

## 2017-10-02 NOTE — Telephone Encounter (Signed)
We need to go ahead and give metolazone today- she takes on Wednesday. I would also give it to her Thursday and Friday this week. Need to restrict fluid to less than 1500 ml daily total.  Shakeeta Godette [name] MD, [facility]

## 2017-10-02 NOTE — Telephone Encounter (Signed)
Pt of Dr. [name] Returned call to [facility] RN  Pt taking lasix 80mg  am, 40mg  pm, metolazone 2.5mg  once weekly Wednesday (took today).  Notes Thursday pt's wt 198 @ home Saw Dr. [name] Friday, wt 194 lbs  Saw Dr. Yong Channel Monday afternoon who recommended cont med therapy  Legs weeping this AM per physical therapist  Mechele Claude from pallative care called report of 203.8 lbs this AM,  Then 202 lbs at 2pm  St[place] reports putting calcium alganate w foam on pt's stasis ulcers, wrapped w kerlex and ace wrap states she has goal to return pt to compression stockings. Dr. Yong Channel said no to compression stockings b/c pt can't take them off w/o assistance.  From Monday to today, Langley Gauss reports the pt has been "drinking a lot". She has gone through 90 fluid oz bottle of lemonade since monday. This is in addition to ice cubes and water.  RN needs instruction/limits for amt pt should drink daily  Color "not great", but she's reporting no shortness of breath. Reporting pt c/o being "so thirsty", pt home chewing on ice cubes currently.  She notes concern BP tends to drop w metolazone - ~90/60 - no symptoms reported w this, however.  Needs orders to see her more often than twice a week if advised. HHRN currently sees pt twice weekly, requesting to come tomorrow to reassess, do dressing care, check VS, etc - i gave verbal OK for this one time extra assessment tomorrow, w understanding to defer to Dr. [name] for further changes.  Advised since metolazone given today, I would get advice on whether OK to increase frequency of use of this medication, but since pt not having symptoms of DOE, fatigue, etc, I recommended no changes to med regimen for now. Informed HHRN I would seek review by Dr. [name] for requests.

## 2017-10-03 ENCOUNTER — Telehealth: Payer: Self-pay

## 2017-10-03 DIAGNOSIS — L97212 Non-pressure chronic ulcer of right calf with fat layer exposed: Secondary | ICD-10-CM | POA: Diagnosis not present

## 2017-10-03 DIAGNOSIS — L97222 Non-pressure chronic ulcer of left calf with fat layer exposed: Secondary | ICD-10-CM | POA: Diagnosis not present

## 2017-10-03 DIAGNOSIS — S91105D Unspecified open wound of left lesser toe(s) without damage to nail, subsequent encounter: Secondary | ICD-10-CM | POA: Diagnosis not present

## 2017-10-03 DIAGNOSIS — I83222 Varicose veins of left lower extremity with both ulcer of calf and inflammation: Secondary | ICD-10-CM | POA: Diagnosis not present

## 2017-10-03 DIAGNOSIS — I83212 Varicose veins of right lower extremity with both ulcer of calf and inflammation: Secondary | ICD-10-CM | POA: Diagnosis not present

## 2017-10-03 DIAGNOSIS — R238 Other skin changes: Secondary | ICD-10-CM | POA: Diagnosis not present

## 2017-10-03 NOTE — Telephone Encounter (Signed)
Called and left a voicemail message asking for a return phone call 

## 2017-10-03 NOTE — Telephone Encounter (Signed)
Returned call to [place] with [facility] on 10/02/17.She stated she is concerned about patient.Stated she has gained 8 lbs in 2 days.She is drinking too much fluid, patient says she stays thirsty.Both lower legs swollen and weeping clear drainage.No sob.Stated she is very forgetful.Dr.Jordan's recommendation given.Stated she thinks patient needs to be seen sooner than scheduled appointment 10/11/17 with Dr.Jordan.Appointment scheduled with Rosaria Ferries PA 10/04/17 at 9:30 am.Spoke to patient's daughter Prentiss Bells she was informed of mother's condition and appointment.Stated she will discuss with mother about moving into assisted living at [facility].

## 2017-10-03 NOTE — Telephone Encounter (Signed)
Received a call from [facility] with [name] she stated patient is better today.Weight down to 202 lbs.Patient will keep appointment with Rosaria Ferries PA 10/04/17 at 9:30 am.

## 2017-10-04 ENCOUNTER — Ambulatory Visit (INDEPENDENT_AMBULATORY_CARE_PROVIDER_SITE_OTHER): Payer: Medicare Other | Admitting: Physician Assistant

## 2017-10-04 ENCOUNTER — Encounter: Payer: Self-pay | Admitting: Physician Assistant

## 2017-10-04 VITALS — BP 82/70 | HR 86 | Ht 68.0 in | Wt 202.0 lb

## 2017-10-04 DIAGNOSIS — I83222 Varicose veins of left lower extremity with both ulcer of calf and inflammation: Secondary | ICD-10-CM | POA: Diagnosis not present

## 2017-10-04 DIAGNOSIS — I482 Chronic atrial fibrillation, unspecified: Secondary | ICD-10-CM

## 2017-10-04 DIAGNOSIS — L97212 Non-pressure chronic ulcer of right calf with fat layer exposed: Secondary | ICD-10-CM | POA: Diagnosis not present

## 2017-10-04 DIAGNOSIS — I6523 Occlusion and stenosis of bilateral carotid arteries: Secondary | ICD-10-CM

## 2017-10-04 DIAGNOSIS — I5032 Chronic diastolic (congestive) heart failure: Secondary | ICD-10-CM | POA: Diagnosis not present

## 2017-10-04 DIAGNOSIS — S91105D Unspecified open wound of left lesser toe(s) without damage to nail, subsequent encounter: Secondary | ICD-10-CM | POA: Diagnosis not present

## 2017-10-04 DIAGNOSIS — I350 Nonrheumatic aortic (valve) stenosis: Secondary | ICD-10-CM | POA: Diagnosis not present

## 2017-10-04 DIAGNOSIS — R238 Other skin changes: Secondary | ICD-10-CM | POA: Diagnosis not present

## 2017-10-04 DIAGNOSIS — I952 Hypotension due to drugs: Secondary | ICD-10-CM

## 2017-10-04 DIAGNOSIS — L97222 Non-pressure chronic ulcer of left calf with fat layer exposed: Secondary | ICD-10-CM | POA: Diagnosis not present

## 2017-10-04 DIAGNOSIS — I83212 Varicose veins of right lower extremity with both ulcer of calf and inflammation: Secondary | ICD-10-CM | POA: Diagnosis not present

## 2017-10-04 NOTE — Patient Instructions (Addendum)
Medication Instructions:  NO LASIX PM DOSE TODAY AND HOLD FOR 3 DAYS DAYS 80MG  IN THE AM ONLY UNTIL TUESDAY  HOLD METOPROLOL TODAY, START METOPROLOL TOMORROW-AT NIGHT If you need a refill on your cardiac medications before your next appointment, please call your pharmacy.  Labwork: BMET IN ONE WEEK AT F/U APPT HERE IN OUR OFFICE AT [facility]  Follow-Up: Your physician wants you to follow-up in: NEXT WEEK WITH DR Rodney Langton SCHEDULED 10-11-17.   Special Instructions: 2-1/2 LITRE BOTTLES OF WATER MAX TODAY  2-1/2 LITER BOTTLES OF WATER AND CUP OF COFFEE AND JUICE DAILY ONLY  MAKE SURE TO LET YOU CAREGIVERS KNOW WHEN YOU ARE LIGHTHEADED OR DIZZY.  Thank you for choosing [facility] at [facility]!!

## 2017-10-04 NOTE — Telephone Encounter (Signed)
Thanks for update
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
## 2017-10-04 NOTE — Telephone Encounter (Signed)
Can we get her in for a visit with a family member today?

## 2017-10-04 NOTE — Telephone Encounter (Signed)
JoAnn with [facility] called back to advise that she was able to speak to Langley Gauss, the other home health nurse and has updates on what Dr. [name] would like to do for the patient's symptoms. Call the number provided to advise.

## 2017-10-04 NOTE — Telephone Encounter (Signed)
Called and spoke to [name] who states her daughter is in right now and she went to Dr. Doug Sou office this morning and they adjusted her medications. She is scheduled to return to Dr. Doug Sou office next week. She asked to wait until next week to see if the adjustments Dr. Doug Sou office made today help.

## 2017-10-04 NOTE — Telephone Encounter (Signed)
Called and spoke to [place]. She states Tarisa is really declining. [facility] saw her both Monday & Wednesday. She states Wednesday patient was very weak. Patient fell on Sunday evening with a large bruise on her bottom. She is having trouble walking around. Patient is unable to remove dressings due to weakness and legs are weeping. [facility] has reached out to daughter Johnney Ou several times. The sons want her moved to a higher level of care but patient refuses. States patient has had a 9lb weight gain in 48 hours. Concerns with low blood pressures and diuretic use. Arville Go questioning possibly engaging Hospice for further support. This has not been addressed with patient or family.

## 2017-10-04 NOTE — Telephone Encounter (Signed)
Called JoAnn back and left a voicemail message asking for a return phone call.

## 2017-10-07 ENCOUNTER — Telehealth: Payer: Self-pay | Admitting: Physician Assistant

## 2017-10-07 ENCOUNTER — Encounter: Payer: Self-pay | Admitting: Family Medicine

## 2017-10-07 ENCOUNTER — Ambulatory Visit (INDEPENDENT_AMBULATORY_CARE_PROVIDER_SITE_OTHER): Payer: Medicare Other | Admitting: Internal Medicine

## 2017-10-07 ENCOUNTER — Ambulatory Visit (INDEPENDENT_AMBULATORY_CARE_PROVIDER_SITE_OTHER): Payer: Medicare Other | Admitting: Family Medicine

## 2017-10-07 VITALS — BP 104/70 | HR 83 | Temp 97.7°F | Ht 68.0 in | Wt 201.6 lb

## 2017-10-07 DIAGNOSIS — Z7901 Long term (current) use of anticoagulants: Secondary | ICD-10-CM | POA: Diagnosis not present

## 2017-10-07 DIAGNOSIS — L97212 Non-pressure chronic ulcer of right calf with fat layer exposed: Secondary | ICD-10-CM | POA: Diagnosis not present

## 2017-10-07 DIAGNOSIS — I5032 Chronic diastolic (congestive) heart failure: Secondary | ICD-10-CM | POA: Diagnosis not present

## 2017-10-07 DIAGNOSIS — I83222 Varicose veins of left lower extremity with both ulcer of calf and inflammation: Secondary | ICD-10-CM | POA: Diagnosis not present

## 2017-10-07 DIAGNOSIS — I4891 Unspecified atrial fibrillation: Secondary | ICD-10-CM | POA: Diagnosis not present

## 2017-10-07 DIAGNOSIS — I10 Essential (primary) hypertension: Secondary | ICD-10-CM | POA: Diagnosis not present

## 2017-10-07 DIAGNOSIS — S91105D Unspecified open wound of left lesser toe(s) without damage to nail, subsequent encounter: Secondary | ICD-10-CM | POA: Diagnosis not present

## 2017-10-07 DIAGNOSIS — I6523 Occlusion and stenosis of bilateral carotid arteries: Secondary | ICD-10-CM | POA: Diagnosis not present

## 2017-10-07 DIAGNOSIS — L97222 Non-pressure chronic ulcer of left calf with fat layer exposed: Secondary | ICD-10-CM | POA: Diagnosis not present

## 2017-10-07 DIAGNOSIS — I83212 Varicose veins of right lower extremity with both ulcer of calf and inflammation: Secondary | ICD-10-CM | POA: Diagnosis not present

## 2017-10-07 DIAGNOSIS — R238 Other skin changes: Secondary | ICD-10-CM | POA: Diagnosis not present

## 2017-10-07 LAB — POCT INR: INR: 2

## 2017-10-07 LAB — PROTIME-INR

## 2017-10-07 NOTE — Patient Instructions (Signed)
Give wheelchair rx to [name] if they are able to continue to come to the house until leg ulcerations heal- I am happy to write an extension if need be/helpful.   Ask OT about help getting on compression hose on- or get comfort keepers to help come in at night

## 2017-10-07 NOTE — Telephone Encounter (Signed)
From progress note dated 10-04-17: Restart the Lasix tomorrow at 80 mg once a day for 3 days. Then restart the Lasix in the afternoon at 40 mg. No change in the metolazone dose. She already has an appointment next week on Friday with Dr. [name]. Check BMET then. Her potassium level has not been elevated so I will not change the potassium.  Pt notified and repeated back directions above 80mg  this morning and tomorrow(08-08-17) back to  80mg  in the am and 40mg  in the pm, she states that she plans to take the metolazone on wednesday as ordered.

## 2017-10-07 NOTE — Telephone Encounter (Signed)
New message     Pt c/o medication issue:  1. Name of Medication:  Lasix   2. How are you currently taking this medication (dosage and times per day)?  Not sure how to take medication  3. Are you having a reaction (difficulty breathing--STAT)? no  4. What is your medication issue?  [name] told her to hold it for 3 days , is she suppose to resume today

## 2017-10-07 NOTE — Progress Notes (Addendum)
Subjective:  [name] is a 83 y.o. year old very pleasant female patient who presents for/with See problem oriented charting ROS- shortness of breath has improved. No chest pain. Continued edema. Slight weight loss.   Past Medical History-  Patient Active Problem List   Diagnosis Date Noted  . Anemia, iron deficiency 05/15/2016    Priority: High  . Non-proliferative diabetic retinopathy, mild, left eye ([place]) 10/10/2015    Priority: High  . Atrial fibrillation ([place]) 01/08/2014    Priority: High  . Diastolic CHF, chronic ([place]) 01/02/2014    Priority: High  . Type II diabetes mellitus with ophthalmic manifestations ([place]) 01/02/2014    Priority: High  . Coronary artery disease     Priority: High  . Carotid artery stenosis 04/01/2012    Priority: High  . Pulmonary HTN ([place])     Priority: Medium  . Acute drug-induced gout of right hand     Priority: Medium  . Depression 03/25/2015    Priority: Medium  . Spinal stenosis     Priority: Medium  . HTN (hypertension) 08/29/2011    Priority: Medium  . Hyperlipidemia 07/18/2011    Priority: Medium  . Allergic rhinitis 11/19/2015    Priority: Low  . GERD (gastroesophageal reflux disease) 03/25/2015    Priority: Low  . Long term current use of anticoagulant therapy 01/08/2014    Priority: Low  . Lichen sclerosus     Priority: Low  . Fecal incontinence 07/09/2017    Medications- reviewed and updated Current Outpatient Prescriptions  Medication Sig Dispense Refill  . clobetasol cream (TEMOVATE) 0.05 % APPLY NIGHTLY AS NEEDED FOR IRRITATION. 7 days in a row maximum 60 g 0  . colchicine 0.6 MG tablet Take Full tablet for 1 week then 1/2 tablet daily for 3 weeks. 30 tablet 2  . febuxostat (ULORIC) 40 MG tablet Take 1 tablet (40 mg total) by mouth daily. 30 tablet 5  . furosemide (LASIX) 40 MG tablet Take 80 mg ( 2 tablets ) in am and 40 mg ( 1 tablet ) in pm 90 tablet 3  . loratadine (CLARITIN) 10 MG tablet Take 10 mg by mouth  daily as needed for allergies.    . metFORMIN (GLUCOPHAGE) 500 MG tablet TAKE ONE TABLET BY MOUTH EVERY MORNING WITH BREAKFAST (Patient taking differently: TAKE 500 mg TABLET BY MOUTH EVERY MORNING WITH BREAKFAST) 90 tablet 3  . metolazone (ZAROXOLYN) 2.5 MG tablet Once a week on Wednesday; or extra if needed with weight gain 30 tablet 1  . metoprolol succinate (TOPROL-XL) 25 MG 24 hr tablet Take 0.5 tablets (12.5 mg total) by mouth daily. 90 tablet 3  . nitroGLYCERIN (NITROSTAT) 0.4 MG SL tablet Place 1 tablet (0.4 mg total) under the tongue every 5 (five) minutes as needed. For chest pain 25 tablet 2  . omeprazole (PRILOSEC) 20 MG capsule Take 1 capsule (20 mg total) by mouth daily. 90 capsule 3  . potassium chloride (K-DUR) 10 MEQ tablet Take 2 tablets (20 mEq total) by mouth as directed. (Patient taking differently: Take 10 mEq by mouth 2 (two) times daily. ) 180 tablet 3  . warfarin (COUMADIN) 5 MG tablet TAKE 1/2 TO 1 TABLET DAILY AS DIRECTED BY COUMADIN CLINIC. (Patient taking differently: Take 5 mg by mouth at bedtime. TAKE 2.5 mg Mon.- Fri  5 mg Sat and sun) 30 tablet 1   No current facility-administered medications for this visit.     Objective: BP 104/70 (BP Location: Left Arm, Patient  Position: Sitting, Cuff Size: Large)   Pulse 83   Temp 97.7 F (36.5 C) (Oral)   Ht 5\' 8"  (1.727 m)   Wt 201 lb 9.6 oz (91.4 kg)   SpO2 100%   BMI 30.65 kg/m  Gen: NAD, resting comfortably CV: RRR Systolic ejection murmur Lungs: Slight crackles at bilateral bases otherwise normal  Abdomen: soft/nontender/nondistended/normal bowel sounds. No rebound or guarding.  Ext: Stable 1+ edema. Erythema without warmth on both legs. No tenderness of legs. Uses walker  Assessment/Plan:  Diastolic CHF, chronic (HCC) S: saw cardiology 3 days ago- fluid restrictoins were gone over. Her lasix was held due to hypotension then she was to go back to 80mg  once a day for 3 days then restart 40mg  in afternoon- that  will be tomorrow. She is continuing metolazone once a week on Wednesdays. Has Dr. [name] follow up.  No longer hypotensive today. She states her shortness of breath continues to improve. She has been much more compliant with fluid restrictions. She will be back on 80 mg Lasix in the morning and 40 mg Lasix in the afternoon tomorrow  The question today was her independence. She has made some significant changes to help improve this. Has comfort keepers coming in twice a week 10-1 AM previously- now 5 days a week. Can go to store, help around the house. Has life alert pendant in case of fall. Did borrow a wheelchair to help her get to blood draw (extended hours with comfort keepers 8-1)  A/P: Continue current plan for Lasix as above. Has bmet planned for this Friday.  We spent the majority of the visit going over ways to keep her independent in home. over 15 minutes was spent in counseling. Please see after visit summary notes  HTN (hypertension) S: controlled on metoprolol 12.5mg  XL and lasix 80mg  each morning (soon to be increased to also in the afternoon tomorrow).  BP Readings from Last 3 Encounters:  10/07/17 104/70  10/04/17 (!) 82/70  09/30/17 98/76  A/P:  blood pressure goal of <140/90. Continue current meds:  Blood pressure much improved from last few visits  We did not fully undress her legs. She has some weeping areas that are still healing. I would advocate continuing home health nursing until fully healed. Counseled her in getting assistance/training to put on compression hose from occupational therapy  Future Appointments Date Time Provider [place]  10/11/2017 11:40 AM [name], [name], MD [facility]  10/28/2017 10:45 AM [name], MD [facility] None  Close follow-up as high risk for rehospitalization  Return in about 3 weeks (around 10/28/2017).  Orders Placed This Encounter  Procedures  . DME Wheelchair manual    Request [facility] to provide.    Patient suffers from CHF which impairs their ability to perform daily activities like mobility in the home- specifically to get to INR checks in her facility.  A walker will not resolve issue for longer distances.  A wheelchair will allow patient to safely perform daily activities. Patient has a caregiver who can provide assistance.  Accessories: elevating leg rests (ELRs), wheel locks, extensions and anti-tippers.  Family is requesting lightweight one which can fold into  car   The duration of face-to-face time during this visit was greater than 25 minutes. Greater than 50% of this time was spent in counseling, explanation of diagnosis, planning of further management, and/or coordination of care including primarily discussions noted in assessment/plan.    Return precautions advised.  [name], MD

## 2017-10-07 NOTE — Assessment & Plan Note (Signed)
S: controlled on metoprolol 12.5mg  XL and lasix 80mg  each morning (soon to be increased to also in the afternoon tomorrow).  BP Readings from Last 3 Encounters:  10/07/17 104/70  10/04/17 (!) 82/70  09/30/17 98/76  A/P:  blood pressure goal of <140/90. Continue current meds:  Blood pressure much improved from last few visits

## 2017-10-07 NOTE — Assessment & Plan Note (Signed)
S: saw cardiology 3 days ago- fluid restrictoins were gone over. Her lasix was held due to hypotension then she was to go back to 80mg  once a day for 3 days then restart 40mg  in afternoon- that will be tomorrow. She is continuing metolazone once a week on Wednesdays. Has Dr. [name] follow up.  No longer hypotensive today. She states her shortness of breath continues to improve. She has been much more compliant with fluid restrictions. She will be back on 80 mg Lasix in the morning and 40 mg Lasix in the afternoon tomorrow  The question today was her independence. She has made some significant changes to help improve this. Has comfort keepers coming in twice a week 10-1 AM previously- now 5 days a week. Can go to store, help around the house. Has life alert pendant in case of fall. Did get a wheelchair to help her get to blood draw (extended hours with comfort keepers 8-1)  A/P: Continue current plan for Lasix as above. Has bmet planned for this Friday.  We spent the majority of the visit going over ways to keep her independent in home. over 15 minutes was spent in counseling. Please see after visit summary notes

## 2017-10-08 DIAGNOSIS — L97212 Non-pressure chronic ulcer of right calf with fat layer exposed: Secondary | ICD-10-CM | POA: Diagnosis not present

## 2017-10-08 DIAGNOSIS — L97222 Non-pressure chronic ulcer of left calf with fat layer exposed: Secondary | ICD-10-CM | POA: Diagnosis not present

## 2017-10-08 DIAGNOSIS — I83222 Varicose veins of left lower extremity with both ulcer of calf and inflammation: Secondary | ICD-10-CM | POA: Diagnosis not present

## 2017-10-08 DIAGNOSIS — I83212 Varicose veins of right lower extremity with both ulcer of calf and inflammation: Secondary | ICD-10-CM | POA: Diagnosis not present

## 2017-10-08 DIAGNOSIS — S91105D Unspecified open wound of left lesser toe(s) without damage to nail, subsequent encounter: Secondary | ICD-10-CM | POA: Diagnosis not present

## 2017-10-08 DIAGNOSIS — R238 Other skin changes: Secondary | ICD-10-CM | POA: Diagnosis not present
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
## 2017-10-09 DIAGNOSIS — I83222 Varicose veins of left lower extremity with both ulcer of calf and inflammation: Secondary | ICD-10-CM | POA: Diagnosis not present

## 2017-10-09 DIAGNOSIS — I83212 Varicose veins of right lower extremity with both ulcer of calf and inflammation: Secondary | ICD-10-CM | POA: Diagnosis not present

## 2017-10-09 DIAGNOSIS — L97222 Non-pressure chronic ulcer of left calf with fat layer exposed: Secondary | ICD-10-CM | POA: Diagnosis not present

## 2017-10-09 DIAGNOSIS — R238 Other skin changes: Secondary | ICD-10-CM | POA: Diagnosis not present

## 2017-10-09 DIAGNOSIS — L97212 Non-pressure chronic ulcer of right calf with fat layer exposed: Secondary | ICD-10-CM | POA: Diagnosis not present

## 2017-10-09 DIAGNOSIS — S91105D Unspecified open wound of left lesser toe(s) without damage to nail, subsequent encounter: Secondary | ICD-10-CM | POA: Diagnosis not present

## 2017-10-09 NOTE — Telephone Encounter (Signed)
Called to ask if she can increase her nursing care to 3x a week to address the wound care due to the left leg still leaking. Patient's weight is also 199.06. Call [place] to advise, okay to leave a detailed message.

## 2017-10-10 DIAGNOSIS — I83222 Varicose veins of left lower extremity with both ulcer of calf and inflammation: Secondary | ICD-10-CM | POA: Diagnosis not present

## 2017-10-10 DIAGNOSIS — I83212 Varicose veins of right lower extremity with both ulcer of calf and inflammation: Secondary | ICD-10-CM | POA: Diagnosis not present

## 2017-10-10 DIAGNOSIS — R238 Other skin changes: Secondary | ICD-10-CM | POA: Diagnosis not present

## 2017-10-10 DIAGNOSIS — L97222 Non-pressure chronic ulcer of left calf with fat layer exposed: Secondary | ICD-10-CM | POA: Diagnosis not present

## 2017-10-10 DIAGNOSIS — S91105D Unspecified open wound of left lesser toe(s) without damage to nail, subsequent encounter: Secondary | ICD-10-CM | POA: Diagnosis not present

## 2017-10-10 DIAGNOSIS — L97212 Non-pressure chronic ulcer of right calf with fat layer exposed: Secondary | ICD-10-CM | POA: Diagnosis not present

## 2017-10-11 ENCOUNTER — Ambulatory Visit (INDEPENDENT_AMBULATORY_CARE_PROVIDER_SITE_OTHER): Payer: Medicare Other | Admitting: Cardiology

## 2017-10-11 ENCOUNTER — Encounter: Payer: Self-pay | Admitting: Cardiology

## 2017-10-11 VITALS — BP 104/70 | HR 72 | Ht 68.0 in | Wt 199.6 lb

## 2017-10-11 DIAGNOSIS — I25810 Atherosclerosis of coronary artery bypass graft(s) without angina pectoris: Secondary | ICD-10-CM

## 2017-10-11 DIAGNOSIS — L97222 Non-pressure chronic ulcer of left calf with fat layer exposed: Secondary | ICD-10-CM | POA: Diagnosis not present

## 2017-10-11 DIAGNOSIS — E1159 Type 2 diabetes mellitus with other circulatory complications: Secondary | ICD-10-CM | POA: Diagnosis not present

## 2017-10-11 DIAGNOSIS — I83222 Varicose veins of left lower extremity with both ulcer of calf and inflammation: Secondary | ICD-10-CM | POA: Diagnosis not present

## 2017-10-11 DIAGNOSIS — L89893 Pressure ulcer of other site, stage 3: Secondary | ICD-10-CM | POA: Diagnosis not present

## 2017-10-11 DIAGNOSIS — L89892 Pressure ulcer of other site, stage 2: Secondary | ICD-10-CM | POA: Diagnosis not present

## 2017-10-11 DIAGNOSIS — S91105D Unspecified open wound of left lesser toe(s) without damage to nail, subsequent encounter: Secondary | ICD-10-CM | POA: Diagnosis not present

## 2017-10-11 DIAGNOSIS — I482 Chronic atrial fibrillation, unspecified: Secondary | ICD-10-CM

## 2017-10-11 DIAGNOSIS — L84 Corns and callosities: Secondary | ICD-10-CM | POA: Diagnosis not present

## 2017-10-11 DIAGNOSIS — L03032 Cellulitis of left toe: Secondary | ICD-10-CM | POA: Diagnosis not present

## 2017-10-11 DIAGNOSIS — I83212 Varicose veins of right lower extremity with both ulcer of calf and inflammation: Secondary | ICD-10-CM | POA: Diagnosis not present

## 2017-10-11 DIAGNOSIS — I6523 Occlusion and stenosis of bilateral carotid arteries: Secondary | ICD-10-CM

## 2017-10-11 DIAGNOSIS — B351 Tinea unguium: Secondary | ICD-10-CM | POA: Diagnosis not present

## 2017-10-11 DIAGNOSIS — I5032 Chronic diastolic (congestive) heart failure: Secondary | ICD-10-CM | POA: Diagnosis not present

## 2017-10-11 DIAGNOSIS — L97212 Non-pressure chronic ulcer of right calf with fat layer exposed: Secondary | ICD-10-CM | POA: Diagnosis not present

## 2017-10-11 DIAGNOSIS — R238 Other skin changes: Secondary | ICD-10-CM | POA: Diagnosis not present

## 2017-10-11 DIAGNOSIS — E11621 Type 2 diabetes mellitus with foot ulcer: Secondary | ICD-10-CM | POA: Diagnosis not present

## 2017-10-11 DIAGNOSIS — Z7901 Long term (current) use of anticoagulants: Secondary | ICD-10-CM

## 2017-10-11 NOTE — Telephone Encounter (Signed)
Yes thanks, okay for verbal orders to go to 3x a week

## 2017-10-11 NOTE — Patient Instructions (Signed)
Continue your current therapy  I will see you in one month.

## 2017-10-11 NOTE — Telephone Encounter (Signed)
Okay for verbal orders. 

## 2017-10-14 ENCOUNTER — Telehealth: Payer: Self-pay | Admitting: Family Medicine

## 2017-10-14 ENCOUNTER — Telehealth: Payer: Self-pay | Admitting: Cardiology

## 2017-10-14 DIAGNOSIS — L97222 Non-pressure chronic ulcer of left calf with fat layer exposed: Secondary | ICD-10-CM | POA: Diagnosis not present

## 2017-10-14 DIAGNOSIS — S91105D Unspecified open wound of left lesser toe(s) without damage to nail, subsequent encounter: Secondary | ICD-10-CM | POA: Diagnosis not present

## 2017-10-14 DIAGNOSIS — L97212 Non-pressure chronic ulcer of right calf with fat layer exposed: Secondary | ICD-10-CM | POA: Diagnosis not present

## 2017-10-14 DIAGNOSIS — R238 Other skin changes: Secondary | ICD-10-CM | POA: Diagnosis not present

## 2017-10-14 DIAGNOSIS — I83212 Varicose veins of right lower extremity with both ulcer of calf and inflammation: Secondary | ICD-10-CM | POA: Diagnosis not present

## 2017-10-14 DIAGNOSIS — I83222 Varicose veins of left lower extremity with both ulcer of calf and inflammation: Secondary | ICD-10-CM | POA: Diagnosis not present

## 2017-10-14 NOTE — Telephone Encounter (Signed)
She needs to have primary care take care of this.  Peter [name] MD, [facility]

## 2017-10-14 NOTE — Telephone Encounter (Signed)
New message    Pt is having pain in her legs and she would like Dr [name] to write a pain medication

## 2017-10-14 NOTE — Telephone Encounter (Signed)
Has she tried tylenol 1000mg  before bed? I would use that as the first step. Would only do 3 doses a day at that dose maximum

## 2017-10-14 NOTE — Telephone Encounter (Signed)
Advised patient, verbalized understanding  

## 2017-10-14 NOTE — Telephone Encounter (Signed)
Spoke with pt she states that she was awake all night with pain, aching and burning to her LE. She informs me that she was here Friday for visit with Dr [name] and did forget to ask for pain medication at that time. She states that he weight is holding steady at 199.6 since Friday. She denies any swelling or SOB. She states that she is compliant with all her medications including her diuretics. She states that the [facility] nurse is coming for her visit today for update she states that she will have her call when she is there with update.   She states that she would like Dr [name] or Suanne Marker to rx pain medication for her LE. She con's stand the pain keeping her up all night. Informed pt that we usually do not rx, but will forward for review

## 2017-10-14 NOTE — Telephone Encounter (Signed)
Patient is requesting pain medication for her legs. Patient states that she could not sleep well due to pain in her legs last night. Call patient to advise, she is requesting a medication go to:  PHARMACY:   [place] [address] [place] - [place] [phone] (Phone) [phone] (Fax)

## 2017-10-15 DIAGNOSIS — S91105D Unspecified open wound of left lesser toe(s) without damage to nail, subsequent encounter: Secondary | ICD-10-CM | POA: Diagnosis not present

## 2017-10-15 DIAGNOSIS — R238 Other skin changes: Secondary | ICD-10-CM | POA: Diagnosis not present

## 2017-10-15 DIAGNOSIS — I83212 Varicose veins of right lower extremity with both ulcer of calf and inflammation: Secondary | ICD-10-CM | POA: Diagnosis not present

## 2017-10-15 DIAGNOSIS — I83222 Varicose veins of left lower extremity with both ulcer of calf and inflammation: Secondary | ICD-10-CM | POA: Diagnosis not present

## 2017-10-15 DIAGNOSIS — L97222 Non-pressure chronic ulcer of left calf with fat layer exposed: Secondary | ICD-10-CM | POA: Diagnosis not present

## 2017-10-15 DIAGNOSIS — L97212 Non-pressure chronic ulcer of right calf with fat layer exposed: Secondary | ICD-10-CM | POA: Diagnosis not present

## 2017-10-16 ENCOUNTER — Emergency Department (HOSPITAL_COMMUNITY)
Admit: 2017-10-16 | Discharge: 2017-10-16 | Disposition: A | Discharge status: Home / Self Care | Payer: Medicare Other | Attending: Emergency Medicine | Admitting: Emergency Medicine

## 2017-10-16 ENCOUNTER — Encounter: Payer: Self-pay | Admitting: Physician Assistant

## 2017-10-16 ENCOUNTER — Inpatient Hospital Stay (HOSPITAL_COMMUNITY)
Admission: EM | Admit: 2017-10-16 | Discharge: 2017-10-28 | DRG: 602 | Disposition: A | Discharge status: Skilled Nursing Facility | Payer: Medicare Other | Attending: Internal Medicine | Admitting: Internal Medicine

## 2017-10-16 ENCOUNTER — Emergency Department (HOSPITAL_COMMUNITY): Payer: Medicare Other

## 2017-10-16 ENCOUNTER — Ambulatory Visit (INDEPENDENT_AMBULATORY_CARE_PROVIDER_SITE_OTHER): Payer: Medicare Other | Admitting: Physician Assistant

## 2017-10-16 VITALS — BP 108/80 | HR 100 | Ht 68.0 in | Wt 200.0 lb

## 2017-10-16 DIAGNOSIS — R609 Edema, unspecified: Secondary | ICD-10-CM | POA: Diagnosis not present

## 2017-10-16 DIAGNOSIS — M109 Gout, unspecified: Secondary | ICD-10-CM | POA: Diagnosis present

## 2017-10-16 DIAGNOSIS — Z951 Presence of aortocoronary bypass graft: Secondary | ICD-10-CM | POA: Diagnosis not present

## 2017-10-16 DIAGNOSIS — F320 Major depressive disorder, single episode, mild: Secondary | ICD-10-CM | POA: Diagnosis not present

## 2017-10-16 DIAGNOSIS — I10 Essential (primary) hypertension: Secondary | ICD-10-CM

## 2017-10-16 DIAGNOSIS — E871 Hypo-osmolality and hyponatremia: Secondary | ICD-10-CM | POA: Diagnosis present

## 2017-10-16 DIAGNOSIS — Z7984 Long term (current) use of oral hypoglycemic drugs: Secondary | ICD-10-CM

## 2017-10-16 DIAGNOSIS — Z87891 Personal history of nicotine dependence: Secondary | ICD-10-CM | POA: Diagnosis not present

## 2017-10-16 DIAGNOSIS — I509 Heart failure, unspecified: Secondary | ICD-10-CM | POA: Diagnosis not present

## 2017-10-16 DIAGNOSIS — E11622 Type 2 diabetes mellitus with other skin ulcer: Secondary | ICD-10-CM | POA: Diagnosis present

## 2017-10-16 DIAGNOSIS — L03119 Cellulitis of unspecified part of limb: Secondary | ICD-10-CM | POA: Diagnosis not present

## 2017-10-16 DIAGNOSIS — R52 Pain, unspecified: Secondary | ICD-10-CM | POA: Diagnosis not present

## 2017-10-16 DIAGNOSIS — I480 Paroxysmal atrial fibrillation: Secondary | ICD-10-CM | POA: Diagnosis present

## 2017-10-16 DIAGNOSIS — R0902 Hypoxemia: Secondary | ICD-10-CM | POA: Diagnosis not present

## 2017-10-16 DIAGNOSIS — L97212 Non-pressure chronic ulcer of right calf with fat layer exposed: Secondary | ICD-10-CM | POA: Diagnosis not present

## 2017-10-16 DIAGNOSIS — L97222 Non-pressure chronic ulcer of left calf with fat layer exposed: Secondary | ICD-10-CM | POA: Diagnosis not present

## 2017-10-16 DIAGNOSIS — L97919 Non-pressure chronic ulcer of unspecified part of right lower leg with unspecified severity: Secondary | ICD-10-CM | POA: Diagnosis present

## 2017-10-16 DIAGNOSIS — L03116 Cellulitis of left lower limb: Secondary | ICD-10-CM | POA: Diagnosis present

## 2017-10-16 DIAGNOSIS — M10241 Drug-induced gout, right hand: Secondary | ICD-10-CM | POA: Diagnosis not present

## 2017-10-16 DIAGNOSIS — I482 Chronic atrial fibrillation: Secondary | ICD-10-CM | POA: Diagnosis not present

## 2017-10-16 DIAGNOSIS — Z955 Presence of coronary angioplasty implant and graft: Secondary | ICD-10-CM

## 2017-10-16 DIAGNOSIS — M79604 Pain in right leg: Secondary | ICD-10-CM

## 2017-10-16 DIAGNOSIS — R29898 Other symptoms and signs involving the musculoskeletal system: Secondary | ICD-10-CM | POA: Diagnosis not present

## 2017-10-16 DIAGNOSIS — I5032 Chronic diastolic (congestive) heart failure: Secondary | ICD-10-CM

## 2017-10-16 DIAGNOSIS — W1830XA Fall on same level, unspecified, initial encounter: Secondary | ICD-10-CM | POA: Diagnosis present

## 2017-10-16 DIAGNOSIS — I481 Persistent atrial fibrillation: Secondary | ICD-10-CM | POA: Diagnosis not present

## 2017-10-16 DIAGNOSIS — I2510 Atherosclerotic heart disease of native coronary artery without angina pectoris: Secondary | ICD-10-CM | POA: Diagnosis present

## 2017-10-16 DIAGNOSIS — M6281 Muscle weakness (generalized): Secondary | ICD-10-CM | POA: Diagnosis not present

## 2017-10-16 DIAGNOSIS — I272 Pulmonary hypertension, unspecified: Secondary | ICD-10-CM

## 2017-10-16 DIAGNOSIS — M7989 Other specified soft tissue disorders: Secondary | ICD-10-CM

## 2017-10-16 DIAGNOSIS — T502X5A Adverse effect of carbonic-anhydrase inhibitors, benzothiadiazides and other diuretics, initial encounter: Secondary | ICD-10-CM | POA: Diagnosis not present

## 2017-10-16 DIAGNOSIS — L97929 Non-pressure chronic ulcer of unspecified part of left lower leg with unspecified severity: Secondary | ICD-10-CM | POA: Diagnosis present

## 2017-10-16 DIAGNOSIS — I5033 Acute on chronic diastolic (congestive) heart failure: Secondary | ICD-10-CM | POA: Diagnosis present

## 2017-10-16 DIAGNOSIS — E1139 Type 2 diabetes mellitus with other diabetic ophthalmic complication: Secondary | ICD-10-CM | POA: Diagnosis present

## 2017-10-16 DIAGNOSIS — F329 Major depressive disorder, single episode, unspecified: Secondary | ICD-10-CM | POA: Diagnosis present

## 2017-10-16 DIAGNOSIS — E876 Hypokalemia: Secondary | ICD-10-CM | POA: Diagnosis not present

## 2017-10-16 DIAGNOSIS — R9431 Abnormal electrocardiogram [ECG] [EKG]: Secondary | ICD-10-CM | POA: Diagnosis not present

## 2017-10-16 DIAGNOSIS — L0390 Cellulitis, unspecified: Secondary | ICD-10-CM | POA: Diagnosis present

## 2017-10-16 DIAGNOSIS — Z8249 Family history of ischemic heart disease and other diseases of the circulatory system: Secondary | ICD-10-CM | POA: Diagnosis not present

## 2017-10-16 DIAGNOSIS — S91105D Unspecified open wound of left lesser toe(s) without damage to nail, subsequent encounter: Secondary | ICD-10-CM | POA: Diagnosis not present

## 2017-10-16 DIAGNOSIS — I130 Hypertensive heart and chronic kidney disease with heart failure and stage 1 through stage 4 chronic kidney disease, or unspecified chronic kidney disease: Secondary | ICD-10-CM | POA: Diagnosis present

## 2017-10-16 DIAGNOSIS — K219 Gastro-esophageal reflux disease without esophagitis: Secondary | ICD-10-CM | POA: Diagnosis not present

## 2017-10-16 DIAGNOSIS — E1122 Type 2 diabetes mellitus with diabetic chronic kidney disease: Secondary | ICD-10-CM | POA: Diagnosis present

## 2017-10-16 DIAGNOSIS — L03115 Cellulitis of right lower limb: Principal | ICD-10-CM

## 2017-10-16 DIAGNOSIS — J9601 Acute respiratory failure with hypoxia: Secondary | ICD-10-CM | POA: Diagnosis not present

## 2017-10-16 DIAGNOSIS — A408 Other streptococcal sepsis: Secondary | ICD-10-CM | POA: Diagnosis not present

## 2017-10-16 DIAGNOSIS — I4891 Unspecified atrial fibrillation: Secondary | ICD-10-CM | POA: Diagnosis not present

## 2017-10-16 DIAGNOSIS — M1611 Unilateral primary osteoarthritis, right hip: Secondary | ICD-10-CM | POA: Diagnosis not present

## 2017-10-16 DIAGNOSIS — R1319 Other dysphagia: Secondary | ICD-10-CM | POA: Diagnosis not present

## 2017-10-16 DIAGNOSIS — R238 Other skin changes: Secondary | ICD-10-CM | POA: Diagnosis not present

## 2017-10-16 DIAGNOSIS — S3210XA Unspecified fracture of sacrum, initial encounter for closed fracture: Secondary | ICD-10-CM | POA: Diagnosis present

## 2017-10-16 DIAGNOSIS — I252 Old myocardial infarction: Secondary | ICD-10-CM

## 2017-10-16 DIAGNOSIS — N183 Chronic kidney disease, stage 3 (moderate): Secondary | ICD-10-CM | POA: Diagnosis present

## 2017-10-16 DIAGNOSIS — R2681 Unsteadiness on feet: Secondary | ICD-10-CM | POA: Diagnosis not present

## 2017-10-16 DIAGNOSIS — R2241 Localized swelling, mass and lump, right lower limb: Secondary | ICD-10-CM | POA: Diagnosis not present

## 2017-10-16 DIAGNOSIS — F32A Depression, unspecified: Secondary | ICD-10-CM | POA: Diagnosis present

## 2017-10-16 DIAGNOSIS — E785 Hyperlipidemia, unspecified: Secondary | ICD-10-CM | POA: Diagnosis present

## 2017-10-16 DIAGNOSIS — R41841 Cognitive communication deficit: Secondary | ICD-10-CM | POA: Diagnosis not present

## 2017-10-16 DIAGNOSIS — I83212 Varicose veins of right lower extremity with both ulcer of calf and inflammation: Secondary | ICD-10-CM | POA: Diagnosis not present

## 2017-10-16 DIAGNOSIS — Z7901 Long term (current) use of anticoagulants: Secondary | ICD-10-CM | POA: Diagnosis not present

## 2017-10-16 DIAGNOSIS — I83222 Varicose veins of left lower extremity with both ulcer of calf and inflammation: Secondary | ICD-10-CM | POA: Diagnosis not present

## 2017-10-16 LAB — I-STAT TROPONIN, ED: TROPONIN I, POC: 0.04 ng/mL (ref 0.00–0.08)

## 2017-10-16 LAB — CBC
HCT: 36.8 % (ref 36.0–46.0)
Hemoglobin: 11.9 g/dL — ABNORMAL LOW (ref 12.0–15.0)
MCH: 26.2 pg (ref 26.0–34.0)
MCHC: 32.3 g/dL (ref 30.0–36.0)
MCV: 81.1 fL (ref 78.0–100.0)
PLATELETS: 317 10*3/uL (ref 150–400)
RBC: 4.54 MIL/uL (ref 3.87–5.11)
RDW: 20.3 % — AB (ref 11.5–15.5)
WBC: 10.7 10*3/uL — AB (ref 4.0–10.5)

## 2017-10-16 LAB — BASIC METABOLIC PANEL
Anion gap: 17 — ABNORMAL HIGH (ref 5–15)
BUN: 58 mg/dL — ABNORMAL HIGH (ref 6–20)
CHLORIDE: 92 mmol/L — AB (ref 101–111)
CO2: 24 mmol/L (ref 22–32)
CREATININE: 1.33 mg/dL — AB (ref 0.44–1.00)
Calcium: 9.1 mg/dL (ref 8.9–10.3)
GFR calc non Af Amer: 36 mL/min — ABNORMAL LOW (ref >60)
GFR, EST AFRICAN AMERICAN: 42 mL/min — AB (ref >60)
Glucose, Bld: 119 mg/dL — ABNORMAL HIGH (ref 65–99)
Potassium: 3.8 mmol/L (ref 3.5–5.1)
SODIUM: 133 mmol/L — AB (ref 135–145)

## 2017-10-16 LAB — PROTIME-INR
INR: 3.38
PROTHROMBIN TIME: 33.9 s — AB (ref 11.4–15.2)

## 2017-10-16 LAB — LACTIC ACID, PLASMA: Lactic Acid, Venous: 1.9 mmol/L (ref 0.5–1.9)

## 2017-10-16 LAB — BRAIN NATRIURETIC PEPTIDE: B Natriuretic Peptide: 2495.1 pg/mL — ABNORMAL HIGH (ref 0.0–100.0)

## 2017-10-16 LAB — MAGNESIUM: MAGNESIUM: 1.5 mg/dL — AB (ref 1.7–2.4)

## 2017-10-16 MED ORDER — HYDROCODONE-ACETAMINOPHEN 5-325 MG PO TABS
1.0000 | ORAL_TABLET | ORAL | Status: DC | PRN
  Administered 2017-10-17 – 2017-10-18 (×3): 2 via ORAL
  Administered 2017-10-18: 1 via ORAL
  Administered 2017-10-19 – 2017-10-21 (×4): 2 via ORAL
  Administered 2017-10-21: 1 via ORAL
  Administered 2017-10-21 – 2017-10-22 (×2): 2 via ORAL
  Administered 2017-10-22: 1 via ORAL
  Administered 2017-10-23 – 2017-10-26 (×8): 2 via ORAL
  Filled 2017-10-16 (×3): qty 2
  Filled 2017-10-16: qty 1
  Filled 2017-10-16 (×5): qty 2
  Filled 2017-10-16 (×2): qty 1
  Filled 2017-10-16 (×9): qty 2

## 2017-10-16 MED ORDER — ZOLPIDEM TARTRATE 5 MG PO TABS
5.0000 mg | ORAL_TABLET | Freq: Once | ORAL | Status: AC
  Administered 2017-10-17: 5 mg via ORAL
  Filled 2017-10-16: qty 1

## 2017-10-16 MED ORDER — DEXTROSE 5 % IV SOLN
1.0000 g | Freq: Once | INTRAVENOUS | Status: AC
  Administered 2017-10-16: 1 g via INTRAVENOUS
  Filled 2017-10-16: qty 10

## 2017-10-16 MED ORDER — ACETAMINOPHEN 650 MG RE SUPP: 650.0000 mg | Freq: Four times a day (QID) | RECTAL | Status: DC | PRN

## 2017-10-16 MED ORDER — SODIUM CHLORIDE 0.9% FLUSH
3.0000 mL | Freq: Two times a day (BID) | INTRAVENOUS | Status: DC
  Administered 2017-10-17 – 2017-10-28 (×19): 3 mL via INTRAVENOUS

## 2017-10-16 MED ORDER — PANTOPRAZOLE SODIUM 40 MG PO TBEC
40.0000 mg | DELAYED_RELEASE_TABLET | Freq: Every day | ORAL | Status: DC
  Administered 2017-10-17 – 2017-10-28 (×12): 40 mg via ORAL
  Filled 2017-10-16 (×12): qty 1

## 2017-10-16 MED ORDER — ACETAMINOPHEN 325 MG PO TABS: 650.0000 mg | ORAL_TABLET | Freq: Four times a day (QID) | ORAL | Status: DC | PRN

## 2017-10-16 MED ORDER — FUROSEMIDE 40 MG PO TABS
40.0000 mg | ORAL_TABLET | Freq: Two times a day (BID) | ORAL | Status: DC
  Administered 2017-10-17 – 2017-10-23 (×13): 40 mg via ORAL
  Filled 2017-10-16 (×13): qty 1

## 2017-10-16 MED ORDER — COLCHICINE 0.6 MG PO TABS
0.3000 mg | ORAL_TABLET | Freq: Every day | ORAL | Status: DC
Start: 2017-10-17 — End: 2017-10-28
  Administered 2017-10-17 – 2017-10-28 (×12): 0.3 mg via ORAL
  Filled 2017-10-16 (×12): qty 1

## 2017-10-16 MED ORDER — ONDANSETRON HCL 4 MG/2ML IJ SOLN: 4.0000 mg | Freq: Four times a day (QID) | INTRAMUSCULAR | Status: DC | PRN

## 2017-10-16 MED ORDER — POTASSIUM CHLORIDE ER 10 MEQ PO TBCR
20.0000 meq | EXTENDED_RELEASE_TABLET | Freq: Every day | ORAL | Status: DC
  Administered 2017-10-17 – 2017-10-23 (×7): 20 meq via ORAL
  Filled 2017-10-16 (×15): qty 2

## 2017-10-16 MED ORDER — SODIUM CHLORIDE 0.9 % IV SOLN
250.0000 mL | INTRAVENOUS | Status: DC | PRN
  Administered 2017-10-19: 1000 mL via INTRAVENOUS

## 2017-10-16 MED ORDER — DEXTROSE 5 % IV SOLN
1.0000 g | INTRAVENOUS | Status: DC
  Administered 2017-10-17 – 2017-10-24 (×8): 1 g via INTRAVENOUS
  Filled 2017-10-16 (×8): qty 10

## 2017-10-16 MED ORDER — MORPHINE SULFATE (PF) 4 MG/ML IV SOLN
4.0000 mg | Freq: Once | INTRAVENOUS | Status: AC
  Administered 2017-10-16: 4 mg via INTRAVENOUS
  Filled 2017-10-16 (×2): qty 1

## 2017-10-16 MED ORDER — METOPROLOL SUCCINATE 12.5 MG HALF TABLET
12.5000 mg | ORAL_TABLET | Freq: Every day | ORAL | Status: DC
  Administered 2017-10-17 – 2017-10-19 (×3): 12.5 mg via ORAL
  Filled 2017-10-16 (×3): qty 1

## 2017-10-16 MED ORDER — LORATADINE 10 MG PO TABS
10.0000 mg | ORAL_TABLET | Freq: Every day | ORAL | Status: DC | PRN
  Administered 2017-10-21: 10 mg via ORAL
  Filled 2017-10-16: qty 1

## 2017-10-16 MED ORDER — SODIUM CHLORIDE 0.9% FLUSH
3.0000 mL | INTRAVENOUS | Status: DC | PRN
  Administered 2017-10-22: 3 mL via INTRAVENOUS
  Filled 2017-10-16: qty 3

## 2017-10-16 MED ORDER — MAGNESIUM SULFATE 2 GM/50ML IV SOLN
2.0000 g | Freq: Once | INTRAVENOUS | Status: AC
  Administered 2017-10-16: 2 g via INTRAVENOUS
  Filled 2017-10-16: qty 50

## 2017-10-16 MED ORDER — FEBUXOSTAT 40 MG PO TABS
40.0000 mg | ORAL_TABLET | Freq: Every day | ORAL | Status: DC
  Administered 2017-10-17 – 2017-10-28 (×12): 40 mg via ORAL
  Filled 2017-10-16 (×13): qty 1

## 2017-10-16 MED ORDER — MAGNESIUM SULFATE 50 % IJ SOLN: 2.0000 g | Freq: Once | INTRAMUSCULAR | Status: DC

## 2017-10-16 MED ORDER — ONDANSETRON HCL 4 MG PO TABS
4.0000 mg | ORAL_TABLET | Freq: Four times a day (QID) | ORAL | Status: DC | PRN
  Filled 2017-10-16: qty 1

## 2017-10-16 NOTE — Telephone Encounter (Signed)
Pt is aware of annotations.  
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
## 2017-10-16 NOTE — Telephone Encounter (Signed)
Spoke to [facility] home [facility] told her I was calling to give orders for increasing Nursing Care 3x's per week. Langley Gauss said she got orders from [place]. Langley Gauss said pt is coming in today to have lump on left thigh evaluated at 4:00. Told her okay see her then.
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
## 2017-10-16 NOTE — ED Notes (Signed)
Bed: WA21 Expected date:  Expected time:  Means of arrival:  Comments: EMS chf ?dvt?

## 2017-10-16 NOTE — ED Triage Notes (Signed)
Per EMS, pt is coming from pcp with a lump on her right upper thigh the size of a soft ball. Pt reports having this for 3 days. Pt is AO x4. Pt has a hx of CHF. EMS reports having edema in bilateral lower extremities with weeping.
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
## 2017-10-16 NOTE — Progress Notes (Signed)
*  PRELIMINARY RESULTS* Vascular Ultrasound Right lower extremity venous duplex has been completed.  Preliminary findings: The visualized veins of the right lower extremity appear negative for deep vein thrombosis.  Exam somewhat limited by pitting edema and penetration. Incidental findings are consistent with: enlarged lymph nodes on the right, largest measuring 3.3 x 1.7cm  Preliminary results given to patients nurse @ 7:35.  Everrett Coombe 10/16/2017, 7:41 PM

## 2017-10-17 ENCOUNTER — Encounter (HOSPITAL_COMMUNITY): Payer: Self-pay

## 2017-10-17 LAB — COMPREHENSIVE METABOLIC PANEL
ALBUMIN: 3.3 g/dL — AB (ref 3.5–5.0)
ALT: 10 U/L — AB (ref 14–54)
AST: 21 U/L (ref 15–41)
Alkaline Phosphatase: 107 U/L (ref 38–126)
Anion gap: 12 (ref 5–15)
BUN: 55 mg/dL — AB (ref 6–20)
CHLORIDE: 95 mmol/L — AB (ref 101–111)
CO2: 24 mmol/L (ref 22–32)
CREATININE: 1.3 mg/dL — AB (ref 0.44–1.00)
Calcium: 8.8 mg/dL — ABNORMAL LOW (ref 8.9–10.3)
GFR calc Af Amer: 43 mL/min — ABNORMAL LOW (ref >60)
GFR calc non Af Amer: 37 mL/min — ABNORMAL LOW (ref >60)
Glucose, Bld: 102 mg/dL — ABNORMAL HIGH (ref 65–99)
POTASSIUM: 4.2 mmol/L (ref 3.5–5.1)
SODIUM: 131 mmol/L — AB (ref 135–145)
Total Bilirubin: 1.6 mg/dL — ABNORMAL HIGH (ref 0.3–1.2)
Total Protein: 6.7 g/dL (ref 6.5–8.1)

## 2017-10-17 LAB — GLUCOSE, CAPILLARY
GLUCOSE-CAPILLARY: 89 mg/dL (ref 65–99)
GLUCOSE-CAPILLARY: 120 mg/dL — AB (ref 65–99)
Glucose-Capillary: 126 mg/dL — ABNORMAL HIGH (ref 65–99)
Glucose-Capillary: 120 mg/dL — ABNORMAL HIGH (ref 65–99)

## 2017-10-17 LAB — CBC
HEMATOCRIT: 35.2 % — AB (ref 36.0–46.0)
Hemoglobin: 11.2 g/dL — ABNORMAL LOW (ref 12.0–15.0)
MCH: 26 pg (ref 26.0–34.0)
MCHC: 31.8 g/dL (ref 30.0–36.0)
MCV: 81.7 fL (ref 78.0–100.0)
PLATELETS: 274 10*3/uL (ref 150–400)
RBC: 4.31 MIL/uL (ref 3.87–5.11)
RDW: 20.1 % — AB (ref 11.5–15.5)
WBC: 9.1 10*3/uL (ref 4.0–10.5)

## 2017-10-17 LAB — MAGNESIUM: MAGNESIUM: 1.9 mg/dL (ref 1.7–2.4)

## 2017-10-17 LAB — PHOSPHORUS: Phosphorus: 3.5 mg/dL (ref 2.5–4.6)

## 2017-10-17 LAB — PROTIME-INR
INR: 2.59
Prothrombin Time: 27.6 seconds — ABNORMAL HIGH (ref 11.4–15.2)

## 2017-10-17 LAB — TSH: TSH: 8.812 u[IU]/mL — ABNORMAL HIGH (ref 0.350–4.500)

## 2017-10-17 LAB — MRSA PCR SCREENING: MRSA by PCR: NEGATIVE

## 2017-10-17 MED ORDER — WARFARIN SODIUM 5 MG PO TABS
5.0000 mg | ORAL_TABLET | Freq: Once | ORAL | Status: AC
  Administered 2017-10-17: 5 mg via ORAL
  Filled 2017-10-17: qty 1

## 2017-10-17 MED ORDER — WARFARIN - PHARMACIST DOSING INPATIENT
Freq: Every day | Status: DC
  Administered 2017-10-21: 2.5

## 2017-10-17 MED ORDER — GLUCERNA SHAKE PO LIQD
237.0000 mL | Freq: Three times a day (TID) | ORAL | Status: DC
  Administered 2017-10-17 – 2017-10-27 (×12): 237 mL via ORAL
  Filled 2017-10-17 (×35): qty 237

## 2017-10-17 NOTE — Progress Notes (Signed)
[place] for warfarin Indication: atrial fibrillation  Allergies  Allergen Reactions  . Ace Inhibitors Other (See Comments)    Intolerance per Dr. Doug Sou note  . Amlodipine Other (See Comments)    Intolerance per Dr. Doug Sou note   . Statins Other (See Comments)    Muscle soreness  . Sulfa Drugs Cross Reactors Itching  . Zetia [Ezetimibe] Other (See Comments)    Muscle soreness  . Fenofibrate Other (See Comments)    Muscle soreness   Vital Signs: Temp: 98.4 F (36.9 C) (10/18 0510) Temp Source: Oral (10/18 0510) BP: 124/85 (10/18 0510)  Labs:  Recent Labs  10/16/17 1914 10/16/17 2120 10/17/17 0440 10/17/17 0822  HGB 11.9*  --  11.2*  --   HCT 36.8  --  35.2*  --   PLT 317  --  274  --   LABPROT  --  33.9*  --  27.6*  INR  --  3.38  --  2.59  CREATININE 1.33*  --  1.30*  --    Estimated Creatinine Clearance: 39.3 mL/min (A) (by C-G formula based on SCr of 1.3 mg/dL (H)).  Medications:  Scheduled:  . colchicine  0.3 mg Oral Daily  . febuxostat  40 mg Oral Daily  . furosemide  40 mg Oral BID  . metoprolol succinate  12.5 mg Oral Daily  . pantoprazole  40 mg Oral Daily  . potassium chloride  20 mEq Oral Daily  . sodium chloride flush  3 mL Intravenous Q12H   Infusions:  . sodium chloride    . cefTRIAXone (ROCEPHIN)  IV     Assessment: 78 yoF c/o right thigh and leg redness and pain on chronic warfarin for A-fib.  Home Warfarin: 5 mg Sun/Tu/Thu/Sat 2.5 M/W/F.  INR=3.38 on admission.  LD 10/16 1800  Today, 10/17/2017  INR 2.59 this morning, decreased from admit last night  Ceftriaxone may increase INR  Goal of Therapy:  INR 2-3   Plan:   Warfarin 5mg  today at 1800  Daily PT/INR  Monitor CBC, s/s bleed  Minda Ditto PharmD Pager [phone] 10/17/2017, 12:06 PM

## 2017-10-17 NOTE — Evaluation (Signed)
Occupational Therapy Evaluation Patient Details Name: [name] MRN: [id number] DOB: 1933-02-05 Today's Date: 10/17/2017    History of Present Illness 83 yo female with onset of pulm edema and effusion with admission for Leg  edema.  Has been treated for LE wounds   PMHx:  DM, CHF, CAD with stent, HTN, HLD, a-fib, OA, spinal stenosis   Clinical Impression   Pt was admitted for the above.  She has a PCA at home mostly for showering and IADLs. Pt could get herself dressed, use toilet and make breakfast herself. She will benefit from continued OT to increase independence with adls. Goals for toileting are for a supervision level.  Pt would benefit from a short stay at SNF, but she is not agreeable to this right now.    Follow Up Recommendations  SNF (pt wants to return to I living; [place] if there)    Equipment Recommendations  None recommended by OT    Recommendations for Other Services       Precautions / Restrictions Precautions Precautions: Fall Restrictions Weight Bearing Restrictions: No      Mobility Bed Mobility               General bed mobility comments: in recliner  Transfers Overall transfer level: Needs assistance Equipment used: Rolling walker (2 wheeled) Transfers: Sit to/from Stand Sit to Stand: Min assist         General transfer comment: transitions slowly; min A to steady; unsteady with SPT    Balance                                           ADL either performed or assessed with clinical judgement   ADL Overall ADL's : Needs assistance/impaired Eating/Feeding: Independent   Grooming: Set up;Sitting   Upper Body Bathing: Set up;Standing   Lower Body Bathing: Moderate assistance;Sit to/from stand   Upper Body Dressing : Set up;Sitting   Lower Body Dressing: Maximal assistance;Sit to/from stand   Toilet Transfer: Minimal assistance;Stand-pivot;BSC;RW   Toileting- Clothing Manipulation and Hygiene: Set  up;Sitting/lateral lean         General ADL Comments: performed spt to 3:1 commode. Pt unsteady.  She hopes to return to independent living     Vision         Perception     Praxis      Pertinent Vitals/Pain Pain Assessment: 0-10 Pain Score: 6  Pain Location: both lower legs Pain Descriptors / Indicators: Cramping;Throbbing;Tightness Pain Intervention(s): Limited activity within patient's tolerance;Monitored during session     Hand Dominance     Extremity/Trunk Assessment Upper Extremity Assessment Upper Extremity Assessment: Overall WFL for tasks assessed   Lower Extremity Assessment per PT Lower Extremity Assessment: RLE deficits/detail;LLE deficits/detail RLE Deficits / Details: noted redness of the lower leg,, able to bear weight LLE Deficits / Details: similar, but worse, some open areas   Cervical / Trunk Assessment Cervical / Trunk Assessment: Kyphotic   Communication Communication Communication: No difficulties   Cognition Arousal/Alertness: Awake/alert Behavior During Therapy: WFL for tasks assessed/performed Overall Cognitive Status: Within Functional Limits for tasks assessed                                     General Comments  pt states her aides come 5 days week x  3 hours.  They help a lot with IADLs.  Pt keeps compression socks on.  Recommended that she have non-slip socks or rubber soled shoes over these whenever walking     Exercises     Shoulder Instructions      Home Living Family/patient expects to be discharged to:: Private residence Living Arrangements: Alone Available Help at Discharge: Home health;Personal care attendant Type of Home: Apartment Home Access: Level entry     Home Layout: One level     Bathroom Shower/Tub: Occupational psychologist: Handicapped height     Home Equipment: Environmental consultant - 2 wheels;Transport chair;Grab bars - toilet;Grab bars - tub/shower;Shower seat - built in   Additional  Comments: at independent living      Prior Functioning/Environment Level of Independence: Needs assistance  Gait / Transfers Assistance Needed: uses transport chair to go to get hair fixed. ADL's / Homemaking Assistance Needed: has  "aide" 5 days/week , 3 hours / day.   Comments: fixed own breakfast, goes to dining room for other meals.  Usually sponge bathes        OT Problem List: Decreased strength;Decreased activity tolerance;Impaired balance (sitting and/or standing);Pain      OT Treatment/Interventions: Self-care/ADL training;Balance training;Patient/family education;Therapeutic activities;DME and/or AE instruction;Energy conservation    OT Goals(Current goals can be found in the care plan section) Acute Rehab OT Goals Patient Stated Goal: to go  back to apt., keep moving OT Goal Formulation: With patient Time For Goal Achievement: 10/24/17 Potential to Achieve Goals: Good ADL Goals Pt Will Perform Grooming: with supervision;standing Pt Will Transfer to Toilet: with supervision;ambulating;grab bars (high commode)  OT Frequency: Min 2X/week   Barriers to D/C:            Co-evaluation              AM-PAC PT "6 Clicks" Daily Activity     Outcome Measure Help from another person eating meals?: None Help from another person taking care of personal grooming?: A Little Help from another person toileting, which includes using toliet, bedpan, or urinal?: A Little Help from another person bathing (including washing, rinsing, drying)?: A Lot Help from another person to put on and taking off regular upper body clothing?: A Little Help from another person to put on and taking off regular lower body clothing?: A Lot 6 Click Score: 17   End of Session    Activity Tolerance: Patient tolerated treatment well Patient left: in chair;with call bell/phone within reach;with chair alarm set  OT Visit Diagnosis: Unsteadiness on feet (R26.81)                Time: 4818-5631 OT  Time Calculation (min): 16 min Charges:  OT General Charges $OT Visit: 1 Visit OT Evaluation $OT Eval Low Complexity: 1 Low G-Codes:     [name], [name] [phone] 10/17/2017  [name] 10/17/2017, 10:23 AM

## 2017-10-17 NOTE — Progress Notes (Signed)
Pt from [place] at [facility]. She is currently active with [facility] home health services. This CM alerted [facility] rep of admission and they will follow along for resumption orders at DC. Marney Doctor RN,BSN,NCM [phone]

## 2017-10-17 NOTE — Progress Notes (Signed)
Initial Nutrition Assessment  DOCUMENTATION CODES:   Obesity unspecified  INTERVENTION:   -Provide Glucerna Shake po TID, each supplement provides 220 kcal and 10 grams of protein -Placed order for dinner meal -RD will continue to monitor  NUTRITION DIAGNOSIS:   Increased nutrient needs related to wound healing as evidenced by estimated needs.  GOAL:   Patient will meet greater than or equal to 90% of their needs  MONITOR:   PO intake, Supplement acceptance, Weight trends, Labs, Skin, I & O's  REASON FOR ASSESSMENT:   Malnutrition Screening Tool    ASSESSMENT:   83 y.o. female with medical history significant of CAD, diastolic CHF, pulmonary HTN, NSTEMI, HTN and atrial fibrillation on coumadin  Patient reports poor appetite. She typically only eats 2 meals day at her apartment. She will make her own breakfast of cheese toast, fruit cup and OJ. She is provided lunch and dinner from her facility, but mainly only eats the dinner meal. Pt requesting her dinner meal of soup for later today. RD placed order. Pt agreeable to trying supplement for additional protein.  Per chart review, pt has lost 10 lb since 9/19 (5% wt loss x 1 month, significant for time frame). Nutrition-Focused physical exam completed. Findings are no fat depletion, mild muscle depletion, and moderate edema in LEs.   Medications: Lasix tablet BID, Protonix daily, K-DUR tablet daily Labs reviewed: Low Na Mg/Phos WNL GFR: 37  Diet Order:  Diet Carb Modified Fluid consistency: Thin; Room service appropriate? Yes  Skin:  Wound (see comment) (chronic leg wounds -DM )  Last BM:  PTA  Height:   Ht Readings from Last 1 Encounters:  10/16/17 5\' 8"  (1.727 m)    Weight:   Wt Readings from Last 1 Encounters:  10/16/17 200 lb (90.7 kg)    Ideal Body Weight:  63.6 kg  BMI:  There is no height or weight on file to calculate BMI.  Estimated Nutritional Needs:   Kcal:  1700-1900  Protein:   70-80g  Fluid:  1.7L/day  EDUCATION NEEDS:   No education needs identified at this time  Clayton Bibles, MS, RD, [place] Dietitian Pager: [phone] After Hours Pager: [phone]

## 2017-10-17 NOTE — Consult Note (Signed)
   [facility] Inpatient Consult   10/17/2017  LAURIANA DENES 03-May-1933 [id number]    Patient screened for potential [facility] Care Management program due to recent readmission.  Chart reviewed. Noted patient was active with [facility].   Telephone call to [facility] (outpatient home based palliative program administered by [place]) at [phone] to confirm that Mrs. Dingee remains active with their program. Writer advised that Mrs. Kubicki has been receiving weekly visits from [facility]' RN.   Made [facility] aware that Mrs. Voth is slated for discharge back to [facility] today.   Also noted Mrs. Fabel has [facility] as well.   Spoke with inpatient RNCM to discuss all of the above notes.    Marthenia Rolling, MSN-Ed, RN,BSN [facility] Liaison [phone]

## 2017-10-17 NOTE — Evaluation (Signed)
Physical Therapy Evaluation Patient Details Name: [name] MRN: [id number] DOB: 10/05/1933 Today's Date: 10/17/2017   History of Present Illness  83 yo female with onset of pulm edema and effusion with admission for Leg  edema.  Has been treated for LE wounds   PMHx:  DM, CHF, CAD with stent, HTN, HLD, a-fib, OA, spinal stenosis  Clinical Impression  The patient is very motivated to mobilize. Only stood at [facility] with min mod assist. Patient desires to return to her apartment. The patient may benefit from short stay at [facility]. Pt admitted with above diagnosis. Pt currently with functional limitations due to the deficits listed below (see PT Problem List). Pt will benefit from skilled PT to increase their independence and safety with mobility to allow discharge to the venue listed below.       Follow Up Recommendations Home health PT, may need increased caregivers, may benefit from SNF  Initially.     Equipment Recommendations  None recommended by PT    Recommendations for Other Services       Precautions / Restrictions Precautions Precautions: Fall      Mobility  Bed Mobility               General bed mobility comments: in recliner  Transfers Overall transfer level: Needs assistance Equipment used: Rolling walker (2 wheeled) Transfers: Sit to/from Stand Sit to Stand: Min assist;Mod assist         General transfer comment: extra time  to power self up,  moves very slowly, Pushes from armrests. Stood at [employer] x 1.5 minutes. Noted increased reddness with dependency.  Ambulation/Gait                Stairs            Wheelchair Mobility    Modified Rankin (Stroke Patients Only)       Balance                                             Pertinent Vitals/Pain Pain Assessment: 0-10 Pain Score: 6  Pain Location: both lower legs Pain Descriptors / Indicators: Cramping;Throbbing;Tightness Pain Intervention(s):  Monitored during session    Home Living Family/patient expects to be discharged to:: Private residence   Available Help at Discharge: Home health;Personal care attendant Type of Home: Apartment Home Access: Level entry     Home Layout: One level Home Equipment: Walker - 2 wheels;Transport chair Additional Comments: at independent living    Prior Function Level of Independence: Independent with assistive device(s);Needs assistance   Gait / Transfers Assistance Needed: uses transport chair to go to get hair fixed.  ADL's / Homemaking Assistance Needed: has  "aide" 5 days/week , 3 hours / day.  Comments: fixed own breakfast, goes to dining room for other meals.     Hand Dominance        Extremity/Trunk Assessment        Lower Extremity Assessment Lower Extremity Assessment: RLE deficits/detail;LLE deficits/detail RLE Deficits / Details: noted redness of the lower leg,, able to bear weight LLE Deficits / Details: similar, but worse, some open areas    Cervical / Trunk Assessment Cervical / Trunk Assessment: Kyphotic  Communication   Communication: No difficulties  Cognition Arousal/Alertness: Awake/alert Behavior During Therapy: WFL for tasks assessed/performed Overall Cognitive Status: Within Functional Limits for tasks assessed  General Comments      Exercises     Assessment/Plan    PT Assessment Patient needs continued PT services  PT Problem List Decreased strength;Decreased range of motion;Decreased activity tolerance;Decreased mobility;Decreased cognition;Decreased safety awareness;Decreased knowledge of precautions;Pain       PT Treatment Interventions DME instruction;Gait training;Functional mobility training;Therapeutic exercise;Therapeutic activities    PT Goals (Current goals can be found in the Care Plan section)  Acute Rehab PT Goals Patient Stated Goal: to go  back to apt., keep moving PT  Goal Formulation: With patient Time For Goal Achievement: 10/31/17 Potential to Achieve Goals: Good    Frequency Min 3X/week   Barriers to discharge        Co-evaluation               AM-PAC PT "6 Clicks" Daily Activity  Outcome Measure Difficulty turning over in bed (including adjusting bedclothes, sheets and blankets)?: A Little Difficulty moving from lying on back to sitting on the side of the bed? : A Little Difficulty sitting down on and standing up from a chair with arms (e.g., wheelchair, bedside commode, etc,.)?: A Lot Help needed moving to and from a bed to chair (including a wheelchair)?: A Lot Help needed walking in hospital room?: A Lot Help needed climbing 3-5 steps with a railing? : Total 6 Click Score: 13    End of Session   Activity Tolerance: Patient tolerated treatment well Patient left: in chair (with OT) Nurse Communication: Mobility status PT Visit Diagnosis: Unsteadiness on feet (R26.81);History of falling (Z91.81)    Time: 6967-8938 PT Time Calculation (min) (ACUTE ONLY): 32 min   Charges:   PT Evaluation $PT Eval Low Complexity: 1 Low PT Treatments $Therapeutic Activity: 8-22 mins   PT G CodesTresa Endo PT [phone]  Claretha Cooper 10/17/2017, 9:58 AM

## 2017-10-17 NOTE — Consult Note (Signed)
[place] Nurse wound consult note Reason for Consult: full and partial thickness areas of dermatitis, cellulitis (L>R) Wound type:infectious Pressure Injury POA: N/A Measurement:  RLE: 7cm x 5cm x 0.1cm with scattered islands of epithelial tissue LLE: distal, 2cm x 2.5cm x 0.1cm with pink moist wound bed LLE lateral, 0.4cm x 0.6cm x 0.1cm red, reepithelializing Wound RNH:AFBX to red, moist Drainage (amount, consistency, odor) serous to light yellow exudate on left, none on right Periwound: erythematous and edematous on left. Resolving erythema and edema on right. Dressing procedure/placement/frequency: Patient has been treated by her [facility] successfully using a calcium alginate dressing and I will continue that here while in house.  The missing element for the LLE (cellulitis) was systemic antibiotics and I suspect she will respond now with those in place. I have today provided bilateral Prevalon boots for use while in bed to correct alignment and provide elevation and protection, as well as a pressure redistribution chair pad for her use while in a chair. [place] nursing team will not follow, but will remain available to this patient, the nursing and medical teams.  Please re-consult if needed. Thanks, Maudie Flakes, MSN, RN, [place], Arther Abbott  Pager# [phone]

## 2017-10-17 NOTE — Progress Notes (Signed)
PROGRESS NOTE    [name]  KGU:[id number] DOB: May 01, 1933 DOA: 10/16/2017 PCP: [name], MD   Brief Narrative: [name] is a 83 y.o. female with medical history significant of CAD, diastolic CHF, pulmonary HTN, NSTEMI, HTN and atrial fibrillation on coumadin DM2  Presented with 3 day hx of Right inner thigh swelling and pain feels that the lump for past 3 days also painful she have had bilateral leg edema but since to be worse in the right patient presented to PCP she's been taking Lasix 40 twice a day as she supposed toas known history of atrial fibrillation states she still uses her Coumadin. No travel no fevers or chills. She ahs hx of chronic bilateral leg ulcers and have had home health care for these 2 times a week     Regarding pertinent Chronic problems: hxofCAD sp CABG, hx of CHF last Echo 09/2018howing preserved EF evidence of concentric hypertrophy. Patient seen today,complains of lower leg pain.    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 Paroxysmal afib Diastolic chf htn Pulmonary htn DM Hyponatremia Gout  PLAN ive reviwed her current meds.continue rocephin.follow up labs.  DVT prophylaxis:coumadin Code Status: full Family Communication: none Disposition Plan:  tbd  Consultants:  none  Procedures:   Antimicrobials:    Subjective:  Objective: Vitals:   10/16/17 2230 10/16/17 2300 10/17/17 0008 10/17/17 0510  BP: 109/76 133/61 119/71 124/85  Pulse:  86    Resp: 18 20 18 18   Temp:   98 F (36.7 C) 98.4 F (36.9 C)  TempSrc:   Oral Oral  SpO2:   98% 92% 94%    Intake/Output Summary (Last 24 hours) at 10/17/17 1608 Last data filed at 10/17/17 0511  Gross per 24 hour  Intake              200 ml  Output              100 ml  Net              100 ml   There were no vitals filed for this visit.  Examination:  General exam: Appears calm and comfortable  Respiratory system: Clear to auscultation. Respiratory effort normal. Cardiovascular system: S1 & S2 heard, RRR. No JVD, murmurs, rubs, gallops or clicks. No pedal edema. Gastrointestinal system: Abdomen is nondistended, soft and nontender. No organomegaly or masses felt. Normal bowel sounds heard. Central nervous system: Alert and oriented. No focal neurological deficits. Extremities: erythema right more than left.edema 1 plus.tender to touch Skin: No rashes, lesions or ulcers Psychiatry: Judgement and insight appear normal. Mood & affect appropriate.     Data Reviewed: I have personally reviewed following labs and imaging studies  CBC:  Recent Labs Lab 10/16/17 1914 10/17/17 0440  WBC 10.7* 9.1  HGB 11.9* 11.2*  HCT 36.8 35.2*  MCV 81.1 81.7  PLT 317 628   Basic Metabolic Panel:  Recent Labs Lab 10/16/17 1914 10/17/17 0440  NA 133* 131*  K 3.8 4.2  CL 92* 95*  CO2 24 24  GLUCOSE 119* 102*  BUN  58* 55*  CREATININE 1.33* 1.30*  CALCIUM 9.1 8.8*  MG 1.5* 1.9  PHOS  --  3.5   GFR: Estimated Creatinine Clearance: 39.3 mL/min (A) (by C-G formula based on SCr of 1.3 mg/dL (H)). Liver Function Tests:  Recent Labs Lab 10/17/17 0440  AST 21  ALT 10*  ALKPHOS 107  BILITOT 1.6*  PROT 6.7  ALBUMIN 3.3*   No results for input(s): LIPASE, AMYLASE in the last 168 hours. No results for input(s): AMMONIA in the last 168 hours. Coagulation Profile:  Recent Labs Lab 10/16/17 2120 10/17/17 0822  INR 3.38 2.59   Cardiac Enzymes: No results for input(s): CKTOTAL, CKMB, CKMBINDEX, TROPONINI in the last 168 hours. BNP (last 3 results) No results for  input(s): PROBNP in the last 8760 hours. HbA1C: No results for input(s): HGBA1C in the last 72 hours. CBG:  Recent Labs Lab 10/17/17 0759 10/17/17 1304  GLUCAP 89 126*   Lipid Profile: No results for input(s): CHOL, HDL, LDLCALC, TRIG, CHOLHDL, LDLDIRECT in the last 72 hours. Thyroid Function Tests:  Recent Labs  10/17/17 0440  TSH 8.812*   Anemia Panel: No results for input(s): VITAMINB12, FOLATE, FERRITIN, TIBC, IRON, RETICCTPCT in the last 72 hours. Sepsis Labs:  Recent Labs Lab 10/16/17 2120  LATICACIDVEN 1.9    Recent Results (from the past 240 hour(s))  MRSA PCR Screening     Status: None   Collection Time: 10/16/17 11:47 PM  Result Value Ref Range Status   MRSA by PCR NEGATIVE NEGATIVE Final    Comment:        The GeneXpert MRSA Assay (FDA approved for NASAL specimens only), is one component of a comprehensive MRSA colonization surveillance program. It is not intended to diagnose MRSA infection nor to guide or monitor treatment for MRSA infections.          Radiology Studies: Dg Chest 2 View  Result Date: 10/16/2017 CLINICAL DATA:  Leg swelling history of CHF EXAM: CHEST  2 VIEW COMPARISON:  09/18/2017 FINDINGS: Post sternotomy changes. Stable cardiomegaly. Linear scarring or atelectasis at the mid lung. Stable posterior pleural thickening or small effusion. Aortic atherosclerosis. No pneumothorax. Suspected skin fold artifact over the right thorax. IMPRESSION: 1. Cardiomegaly with minimal central congestion. Stable appearance of mild posterior pleural thickening or small effusion. Electronically Signed   By: Donavan Foil M.D.   On: 10/16/2017 18:39        Scheduled Meds: . colchicine  0.3 mg Oral Daily  . febuxostat  40 mg Oral Daily  . feeding supplement (GLUCERNA SHAKE)  237 mL Oral TID BM  . furosemide  40 mg Oral BID  . metoprolol succinate  12.5 mg Oral Daily  . pantoprazole  40 mg Oral Daily  . potassium chloride  20 mEq Oral Daily    . sodium chloride flush  3 mL Intravenous Q12H  . warfarin  5 mg Oral ONCE-1800  . Warfarin - Pharmacist Dosing Inpatient   Does not apply q1800   Continuous Infusions: . sodium chloride    . cefTRIAXone (ROCEPHIN)  IV       LOS: 1 day      Georgette Shell, MD [facility]  If 7PM-7AM, please contact night-coverage [email or web address] Password [email or web address] 10/17/2017, 4:08 PM

## 2017-10-18 LAB — CBC
HEMATOCRIT: 34.4 % — AB (ref 36.0–46.0)
Hemoglobin: 10.8 g/dL — ABNORMAL LOW (ref 12.0–15.0)
MCH: 25.8 pg — ABNORMAL LOW (ref 26.0–34.0)
MCHC: 31.4 g/dL (ref 30.0–36.0)
MCV: 82.1 fL (ref 78.0–100.0)
PLATELETS: 259 10*3/uL (ref 150–400)
RBC: 4.19 MIL/uL (ref 3.87–5.11)
RDW: 20.2 % — AB (ref 11.5–15.5)
WBC: 7 10*3/uL (ref 4.0–10.5)

## 2017-10-18 LAB — GLUCOSE, CAPILLARY
GLUCOSE-CAPILLARY: 75 mg/dL (ref 65–99)
GLUCOSE-CAPILLARY: 151 mg/dL — AB (ref 65–99)
Glucose-Capillary: 92 mg/dL (ref 65–99)
Glucose-Capillary: 127 mg/dL — ABNORMAL HIGH (ref 65–99)

## 2017-10-18 LAB — BASIC METABOLIC PANEL
Anion gap: 14 (ref 5–15)
BUN: 56 mg/dL — ABNORMAL HIGH (ref 6–20)
CALCIUM: 8.5 mg/dL — AB (ref 8.9–10.3)
CO2: 24 mmol/L (ref 22–32)
CREATININE: 1.38 mg/dL — AB (ref 0.44–1.00)
Chloride: 92 mmol/L — ABNORMAL LOW (ref 101–111)
GFR calc Af Amer: 40 mL/min — ABNORMAL LOW (ref >60)
GFR, EST NON AFRICAN AMERICAN: 35 mL/min — AB (ref >60)
GLUCOSE: 102 mg/dL — AB (ref 65–99)
Potassium: 3.7 mmol/L (ref 3.5–5.1)
Sodium: 130 mmol/L — ABNORMAL LOW (ref 135–145)

## 2017-10-18 LAB — PROTIME-INR
INR: 2.56
Prothrombin Time: 27.3 seconds — ABNORMAL HIGH (ref 11.4–15.2)

## 2017-10-18 MED ORDER — WARFARIN SODIUM 2.5 MG PO TABS
2.5000 mg | ORAL_TABLET | Freq: Once | ORAL | Status: AC
  Administered 2017-10-18: 2.5 mg via ORAL
  Filled 2017-10-18: qty 1

## 2017-10-18 NOTE — Progress Notes (Signed)
[place] for warfarin Indication: atrial fibrillation  Allergies  Allergen Reactions  . Ace Inhibitors Other (See Comments)    Intolerance per Dr. Doug Sou note  . Amlodipine Other (See Comments)    Intolerance per Dr. Doug Sou note   . Statins Other (See Comments)    Muscle soreness  . Sulfa Drugs Cross Reactors Itching  . Zetia [Ezetimibe] Other (See Comments)    Muscle soreness  . Fenofibrate Other (See Comments)    Muscle soreness   Vital Signs: Temp: 98 F (36.7 C) (10/19 0612) Temp Source: Oral (10/19 0612) BP: 108/68 (10/19 0612) Pulse Rate: 75 (10/19 0612)  Labs:  Recent Labs  10/16/17 1914 10/16/17 2120 10/17/17 0440 10/17/17 0822 10/18/17 0409  HGB 11.9*  --  11.2*  --  10.8*  HCT 36.8  --  35.2*  --  34.4*  PLT 317  --  274  --  259  LABPROT  --  33.9*  --  27.6* 27.3*  INR  --  3.38  --  2.59 2.56  CREATININE 1.33*  --  1.30*  --  1.38*   Estimated Creatinine Clearance: 37 mL/min (A) (by C-G formula based on SCr of 1.38 mg/dL (H)).  Medications:  Scheduled:  . colchicine  0.3 mg Oral Daily  . febuxostat  40 mg Oral Daily  . feeding supplement (GLUCERNA SHAKE)  237 mL Oral TID BM  . furosemide  40 mg Oral BID  . metoprolol succinate  12.5 mg Oral Daily  . pantoprazole  40 mg Oral Daily  . potassium chloride  20 mEq Oral Daily  . sodium chloride flush  3 mL Intravenous Q12H  . Warfarin - Pharmacist Dosing Inpatient   Does not apply q1800   Infusions:  . sodium chloride    . cefTRIAXone (ROCEPHIN)  IV Stopped (10/17/17 2234)   Assessment: 51 yoF c/o right thigh and leg redness and pain on chronic warfarin for A-fib.  Home Warfarin: 5 mg on Sun/Tu/Thu/Sat, and 2.5 mg on M/W/F.  INR=3.38 on admission.  LD 10/16 1800  Today, 10/18/2017  INR 2.56 this morning, tolerating po diet  Hgb sl decreased, Plt decr, but wnl  Ceftriaxone may increase INR  Goal of Therapy:  INR 2-3   Plan:   Warfarin 2.5mg   today at 1800  Daily PT/INR  Monitor CBC, s/s bleed  Minda Ditto PharmD Pager [phone] 10/18/2017, 7:44 AM

## 2017-10-18 NOTE — Progress Notes (Signed)
PROGRESS NOTE    [name]  KPT:[id number] DOB: 11-06-1933 DOA: 10/16/2017 PCP: [name], MD    Brief Narrative: 83 y.o.femalewith medical history significant of CAD, diastolic CHF, pulmonary HTN, NSTEMI, HTN and atrial fibrillation on coumadin DM2  Presented with 3 day hx of Right inner thigh swelling and pain feels that the lump for past 3 days also painful she have had bilateral leg edema but since to be worse in the right patient presented to PCP she's been taking Lasix 40 twice a day as she supposed toas known history of atrial fibrillation states she still uses her Coumadin. No travel no fevers or chills. She ahs hx of chronic bilateral leg ulcers and have had home health care for these 2 times a week   Regarding pertinent Chronic problems: hxofCAD sp CABG, hx of CHF last Echo 09/2018howing preserved EF evidence of concentric hypertrophy. Patient seen today,complains of lower leg pain. 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 Paroxysmal afib Diastolic chf htn Pulmonary htn DM Hyponatremia na lower.fluid restriction. Gout  PLAN ive reviwed her current meds.continue rocephin.   DVT prophylaxis coumadin Code Status: full Family Communication: none Disposition Plan:  tbd  Consultants:  none Procedures:   Antimicrobials: rocephin  Objective: Vitals:   10/17/17 0008 10/17/17 0510 10/17/17 1859 10/18/17 0612  BP: 119/71 124/85 126/80 108/68  Pulse:   72 75  Resp: 18 18 18 16   Temp: 98 F (36.7 C) 98.4 F (36.9 C) 98.6 F  (37 C) 98 F (36.7 C)  TempSrc: Oral Oral Oral Oral  SpO2: 92% 94% 98% 98%    Intake/Output Summary (Last 24 hours) at 10/18/17 1326 Last data filed at 10/18/17 0842  Gross per 24 hour  Intake              960 ml  Output                0 ml  Net              960 ml   There were no vitals filed for this visit.  Examination:  General exam: Appears calm and comfortable  Respiratory system: Clear to auscultation. Respiratory effort normal. Cardiovascular system: S1 & S2 heard, RRR. No JVD, murmurs, rubs, gallops or clicks. No pedal edema. Gastrointestinal system: Abdomen is nondistended, soft and nontender. No organomegaly or masses felt. Normal bowel sounds heard. Central nervous system: Alert and oriented. No focal neurological deficits. Extremities: erythema b/l le Skin: No rashes, lesions or ulcers Psychiatry: Judgement and insight appear normal. Mood & affect appropriate.     Data Reviewed: I have personally reviewed following labs and imaging studies  CBC:  Recent Labs Lab 10/16/17 1914 10/17/17 0440 10/18/17 0409  WBC 10.7* 9.1 7.0  HGB 11.9* 11.2* 10.8*  HCT 36.8 35.2* 34.4*  MCV 81.1 81.7 82.1  PLT 317 274 170   Basic Metabolic Panel:  Recent Labs Lab 10/16/17 1914 10/17/17 0440 10/18/17 0409  NA 133* 131* 130*  K 3.8 4.2 3.7  CL 92* 95* 92*  CO2  24 24 24   GLUCOSE 119* 102* 102*  BUN 58* 55* 56*  CREATININE 1.33* 1.30* 1.38*  CALCIUM 9.1 8.8* 8.5*  MG 1.5* 1.9  --   PHOS  --  3.5  --    GFR: Estimated Creatinine Clearance: 37 mL/min (A) (by C-G formula based on SCr of 1.38 mg/dL (H)). Liver Function Tests:  Recent Labs Lab 10/17/17 0440  AST 21  ALT 10*  ALKPHOS 107  BILITOT 1.6*  PROT 6.7  ALBUMIN 3.3*   No results for input(s): LIPASE, AMYLASE in the last 168 hours. No results for input(s): AMMONIA in the last 168 hours. Coagulation Profile:  Recent Labs Lab 10/16/17 2120 10/17/17 0822 10/18/17 0409  INR 3.38 2.59 2.56    Cardiac Enzymes: No results for input(s): CKTOTAL, CKMB, CKMBINDEX, TROPONINI in the last 168 hours. BNP (last 3 results) No results for input(s): PROBNP in the last 8760 hours. HbA1C: No results for input(s): HGBA1C in the last 72 hours. CBG:  Recent Labs Lab 10/17/17 1304 10/17/17 1810 10/17/17 2042 10/18/17 0734 10/18/17 1202  GLUCAP 126* 120* 120* 92 127*   Lipid Profile: No results for input(s): CHOL, HDL, LDLCALC, TRIG, CHOLHDL, LDLDIRECT in the last 72 hours. Thyroid Function Tests:  Recent Labs  10/17/17 0440  TSH 8.812*   Anemia Panel: No results for input(s): VITAMINB12, FOLATE, FERRITIN, TIBC, IRON, RETICCTPCT in the last 72 hours. Sepsis Labs:  Recent Labs Lab 10/16/17 2120  LATICACIDVEN 1.9    Recent Results (from the past 240 hour(s))  MRSA PCR Screening     Status: None   Collection Time: 10/16/17 11:47 PM  Result Value Ref Range Status   MRSA by PCR NEGATIVE NEGATIVE Final    Comment:        The GeneXpert MRSA Assay (FDA approved for NASAL specimens only), is one component of a comprehensive MRSA colonization surveillance program. It is not intended to diagnose MRSA infection nor to guide or monitor treatment for MRSA infections.          Radiology Studies: Dg Chest 2 View  Result Date: 10/16/2017 CLINICAL DATA:  Leg swelling history of CHF EXAM: CHEST  2 VIEW COMPARISON:  09/18/2017 FINDINGS: Post sternotomy changes. Stable cardiomegaly. Linear scarring or atelectasis at the mid lung. Stable posterior pleural thickening or small effusion. Aortic atherosclerosis. No pneumothorax. Suspected skin fold artifact over the right thorax. IMPRESSION: 1. Cardiomegaly with minimal central congestion. Stable appearance of mild posterior pleural thickening or small effusion. Electronically Signed   By: Donavan Foil M.D.   On: 10/16/2017 18:39        Scheduled Meds: . colchicine  0.3 mg Oral Daily  . febuxostat  40 mg Oral Daily  .  feeding supplement (GLUCERNA SHAKE)  237 mL Oral TID BM  . furosemide  40 mg Oral BID  . metoprolol succinate  12.5 mg Oral Daily  . pantoprazole  40 mg Oral Daily  . potassium chloride  20 mEq Oral Daily  . sodium chloride flush  3 mL Intravenous Q12H  . warfarin  2.5 mg Oral ONCE-1800  . Warfarin - Pharmacist Dosing Inpatient   Does not apply q1800   Continuous Infusions: . sodium chloride    . cefTRIAXone (ROCEPHIN)  IV Stopped (10/17/17 2234)     LOS: 2 days     Georgette Shell, MD [facility]   If 7PM-7AM, please contact night-coverage [email or web address] Password [email or web address] 10/18/2017, 1:26 PM

## 2017-10-19 LAB — GLUCOSE, CAPILLARY
GLUCOSE-CAPILLARY: 129 mg/dL — AB (ref 65–99)
GLUCOSE-CAPILLARY: 117 mg/dL — AB (ref 65–99)
Glucose-Capillary: 88 mg/dL (ref 65–99)
Glucose-Capillary: 92 mg/dL (ref 65–99)

## 2017-10-19 LAB — BASIC METABOLIC PANEL
ANION GAP: 13 (ref 5–15)
BUN: 56 mg/dL — ABNORMAL HIGH (ref 6–20)
CHLORIDE: 95 mmol/L — AB (ref 101–111)
CO2: 24 mmol/L (ref 22–32)
Calcium: 8.5 mg/dL — ABNORMAL LOW (ref 8.9–10.3)
Creatinine, Ser: 1.41 mg/dL — ABNORMAL HIGH (ref 0.44–1.00)
GFR calc non Af Amer: 34 mL/min — ABNORMAL LOW (ref >60)
GFR, EST AFRICAN AMERICAN: 39 mL/min — AB (ref >60)
Glucose, Bld: 105 mg/dL — ABNORMAL HIGH (ref 65–99)
POTASSIUM: 3.5 mmol/L (ref 3.5–5.1)
SODIUM: 132 mmol/L — AB (ref 135–145)

## 2017-10-19 LAB — PROTIME-INR
INR: 2.77
Prothrombin Time: 29 seconds — ABNORMAL HIGH (ref 11.4–15.2)

## 2017-10-19 LAB — CBC
HCT: 33.9 % — ABNORMAL LOW (ref 36.0–46.0)
Hemoglobin: 10.8 g/dL — ABNORMAL LOW (ref 12.0–15.0)
MCH: 25.9 pg — AB (ref 26.0–34.0)
MCHC: 31.9 g/dL (ref 30.0–36.0)
MCV: 81.3 fL (ref 78.0–100.0)
PLATELETS: 280 10*3/uL (ref 150–400)
RBC: 4.17 MIL/uL (ref 3.87–5.11)
RDW: 19.9 % — ABNORMAL HIGH (ref 11.5–15.5)
WBC: 7.2 10*3/uL (ref 4.0–10.5)

## 2017-10-19 MED ORDER — WARFARIN SODIUM 2.5 MG PO TABS
2.5000 mg | ORAL_TABLET | Freq: Once | ORAL | Status: AC
  Administered 2017-10-19: 2.5 mg via ORAL
  Filled 2017-10-19: qty 1

## 2017-10-19 MED ORDER — METFORMIN HCL 500 MG PO TABS
500.0000 mg | ORAL_TABLET | Freq: Every day | ORAL | Status: DC
  Administered 2017-10-20: 500 mg via ORAL
  Filled 2017-10-19: qty 1

## 2017-10-19 NOTE — Progress Notes (Signed)
PROGRESS NOTE    [name]  EQA:[id number] DOB: 17-Jun-1933 DOA: 10/16/2017 PCP: [name], MD  Brief Narrative:   83 y.o.femalewith medical history significant of CAD, diastolic CHF, pulmonary HTN, NSTEMI, HTN and atrial fibrillation on coumadin DM2  Presented with 3 day hx of Right inner thigh swelling and pain feels that the lump for past 3 days also painful she have had bilateral leg edema but since to be worse in the right patient presented to PCP she's been taking Lasix 40 twice a day as she supposed toas known history of atrial fibrillation states she still uses her Coumadin. No travel no fevers or chills. She ahs hx of chronic bilateral leg ulcers and have had home health care for these 2 times a week   Regarding pertinent Chronic problems: hxofCAD sp CABG, hx of CHF last Echo 09/2018howing preserved EF evidence of concentric hypertrophy. Patient seen today,complains of lower leg pain though the pain is better.  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 Paroxysmal afib Diastolic chf htn BP low to low normal.  dc beta-blocker. Pulmonary htn DM Hyponatremia na lower.fluid restriction. Gout   DVT prophylaxis: Coumadin Code Status: Full code Family Communication: none Disposition Plan:tbd  procedures :Antimicrobials: Rocephin  Subjective: Feels better with decreasing pain and edema   Objective: Vitals:   10/18/17 0612 10/18/17 1450 10/18/17 2019 10/19/17 0549  BP: 108/68 102/79 (!) 76/51 110/60  Pulse: 75 75 73 70  Resp: 16 16 14 14   Temp: 98 F (36.7 C) 98.3 F (36.8  C) 98 F (36.7 C) 97.9 F (36.6 C)  TempSrc: Oral Oral Oral Oral  SpO2: 98% 95% 93% 92%    Intake/Output Summary (Last 24 hours) at 10/19/17 1231 Last data filed at 10/19/17 1200  Gross per 24 hour  Intake              779 ml  Output              400 ml  Net              379 ml   There were no vitals filed for this visit.  Examination:  General exam: Appears calm and comfortable  Respiratory system: Clear to auscultation. Respiratory effort normal. Cardiovascular system: S1 & S2 heard, RRR. No JVD, murmurs, rubs, gallops or clicks. No pedal edema. Gastrointestinal system: Abdomen is nondistended, soft and nontender. No organomegaly or masses felt. Normal bowel sounds heard. Central nervous system: Alert and oriented. No focal neurological deficits. Extremities: erythema edema b/l le left more than right Skin: No rashes, lesions or ulcers Psychiatry: Judgement and insight appear normal. Mood & affect appropriate.     Data Reviewed: I have personally reviewed following labs and imaging studies  CBC:  Recent Labs Lab 10/16/17 1914 10/17/17 0440 10/18/17 0409 10/19/17 0346  WBC 10.7* 9.1 7.0 7.2  HGB 11.9* 11.2* 10.8* 10.8*  HCT 36.8 35.2* 34.4* 33.9*  MCV 81.1 81.7 82.1 81.3  PLT 317 274 259 979   Basic Metabolic Panel:  Recent Labs Lab 10/16/17 1914 10/17/17 0440 10/18/17 0409 10/19/17  0346  NA 133* 131* 130* 132*  K 3.8 4.2 3.7 3.5  CL 92* 95* 92* 95*  CO2 24 24 24 24   GLUCOSE 119* 102* 102* 105*  BUN 58* 55* 56* 56*  CREATININE 1.33* 1.30* 1.38* 1.41*  CALCIUM 9.1 8.8* 8.5* 8.5*  MG 1.5* 1.9  --   --   PHOS  --  3.5  --   --    GFR: Estimated Creatinine Clearance: 36.2 mL/min (A) (by C-G formula based on SCr of 1.41 mg/dL (H)). Liver Function Tests:  Recent Labs Lab 10/17/17 0440  AST 21  ALT 10*  ALKPHOS 107  BILITOT 1.6*  PROT 6.7  ALBUMIN 3.3*   No results for input(s): LIPASE, AMYLASE in the last 168 hours. No results for input(s):  AMMONIA in the last 168 hours. Coagulation Profile:  Recent Labs Lab 10/16/17 2120 10/17/17 0822 10/18/17 0409 10/19/17 0346  INR 3.38 2.59 2.56 2.77   Cardiac Enzymes: No results for input(s): CKTOTAL, CKMB, CKMBINDEX, TROPONINI in the last 168 hours. BNP (last 3 results) No results for input(s): PROBNP in the last 8760 hours. HbA1C: No results for input(s): HGBA1C in the last 72 hours. CBG:  Recent Labs Lab 10/18/17 1202 10/18/17 1812 10/18/17 2010 10/19/17 0817 10/19/17 1202  GLUCAP 127* 75 151* 129* 88   Lipid Profile: No results for input(s): CHOL, HDL, LDLCALC, TRIG, CHOLHDL, LDLDIRECT in the last 72 hours. Thyroid Function Tests:  Recent Labs  10/17/17 0440  TSH 8.812*   Anemia Panel: No results for input(s): VITAMINB12, FOLATE, FERRITIN, TIBC, IRON, RETICCTPCT in the last 72 hours. Sepsis Labs:  Recent Labs Lab 10/16/17 2120  LATICACIDVEN 1.9    Recent Results (from the past 240 hour(s))  MRSA PCR Screening     Status: None   Collection Time: 10/16/17 11:47 PM  Result Value Ref Range Status   MRSA by PCR NEGATIVE NEGATIVE Final    Comment:        The GeneXpert MRSA Assay (FDA approved for NASAL specimens only), is one component of a comprehensive MRSA colonization surveillance program. It is not intended to diagnose MRSA infection nor to guide or monitor treatment for MRSA infections.          Radiology Studies: No results found.      Scheduled Meds: . colchicine  0.3 mg Oral Daily  . febuxostat  40 mg Oral Daily  . feeding supplement (GLUCERNA SHAKE)  237 mL Oral TID BM  . furosemide  40 mg Oral BID  . metoprolol succinate  12.5 mg Oral Daily  . pantoprazole  40 mg Oral Daily  . potassium chloride  20 mEq Oral Daily  . sodium chloride flush  3 mL Intravenous Q12H  . warfarin  2.5 mg Oral ONCE-1800  . Warfarin - Pharmacist Dosing Inpatient   Does not apply q1800   Continuous Infusions: . sodium chloride 1,000 mL  (10/19/17 0533)  . cefTRIAXone (ROCEPHIN)  IV Stopped (10/18/17 2304)     LOS: 3 days        Alexandria Shell, MD [facility] If 7PM-7AM, please contact night-coverage [email or web address] Password [email or web address] 10/19/2017, 12:31 PM

## 2017-10-19 NOTE — Progress Notes (Signed)
Erythema noted to right upper thigh. Pt states this is a new finding. Borders marked, Dr. Rodena Piety paged.

## 2017-10-19 NOTE — Progress Notes (Signed)
[place] for warfarin Indication: atrial fibrillation  Allergies  Allergen Reactions  . Ace Inhibitors Other (See Comments)    Intolerance per Dr. Doug Sou note  . Amlodipine Other (See Comments)    Intolerance per Dr. Doug Sou note   . Statins Other (See Comments)    Muscle soreness  . Sulfa Drugs Cross Reactors Itching  . Zetia [Ezetimibe] Other (See Comments)    Muscle soreness  . Fenofibrate Other (See Comments)    Muscle soreness   Vital Signs: Temp: 97.9 F (36.6 C) (10/20 0549) Temp Source: Oral (10/20 0549) BP: 110/60 (10/20 0549) Pulse Rate: 70 (10/20 0549)  Labs:  Recent Labs  10/17/17 0440 10/17/17 0822 10/18/17 0409 10/19/17 0346  HGB 11.2*  --  10.8* 10.8*  HCT 35.2*  --  34.4* 33.9*  PLT 274  --  259 280  LABPROT  --  27.6* 27.3* 29.0*  INR  --  2.59 2.56 2.77  CREATININE 1.30*  --  1.38* 1.41*   Estimated Creatinine Clearance: 36.2 mL/min (A) (by C-G formula based on SCr of 1.41 mg/dL (H)).  Medications:  Scheduled:  . colchicine  0.3 mg Oral Daily  . febuxostat  40 mg Oral Daily  . feeding supplement (GLUCERNA SHAKE)  237 mL Oral TID BM  . furosemide  40 mg Oral BID  . metoprolol succinate  12.5 mg Oral Daily  . pantoprazole  40 mg Oral Daily  . potassium chloride  20 mEq Oral Daily  . sodium chloride flush  3 mL Intravenous Q12H  . Warfarin - Pharmacist Dosing Inpatient   Does not apply q1800   Infusions:  . sodium chloride 1,000 mL (10/19/17 0533)  . cefTRIAXone (ROCEPHIN)  IV Stopped (10/18/17 2304)   Assessment: 79 yoF c/o right thigh and leg redness and pain on chronic warfarin for A-fib.  Home Warfarin: 5 mg on Sun/Tu/Thu/Sat, and 2.5 mg on M/W/F.  INR=3.38 on admission.  LD 10/16 1800  Today, 10/19/2017  INR therapeutic, but increasing towards upper end on home regime (2.77)  Diet: Eating 25-50% of meal tray  CBC: Hg slightly low, but stable  No bleeding reported  DDI: Broad spectrum  antibiotics (Ceftriaxone) may increase INR  Goal of Therapy:  INR 2-3   Plan:   Repeat Warfarin 2.5mg  today at 1800 (1/2 of home dose)  Daily PT/INR  Monitor CBC, s/sx bleed  [name], PharmD, BCPS Pager: [phone] 10/19/2017, 12:27 PM

## 2017-10-20 LAB — GLUCOSE, CAPILLARY
GLUCOSE-CAPILLARY: 107 mg/dL — AB (ref 65–99)
GLUCOSE-CAPILLARY: 89 mg/dL (ref 65–99)
Glucose-Capillary: 113 mg/dL — ABNORMAL HIGH (ref 65–99)
Glucose-Capillary: 121 mg/dL — ABNORMAL HIGH (ref 65–99)

## 2017-10-20 LAB — PROTIME-INR
INR: 2.84
Prothrombin Time: 29.6 seconds — ABNORMAL HIGH (ref 11.4–15.2)

## 2017-10-20 MED ORDER — METOLAZONE 2.5 MG PO TABS
2.5000 mg | ORAL_TABLET | Freq: Every day | ORAL | Status: DC
  Administered 2017-10-20 – 2017-10-22 (×3): 2.5 mg via ORAL
  Filled 2017-10-20 (×3): qty 1

## 2017-10-20 MED ORDER — WARFARIN SODIUM 1 MG PO TABS
1.0000 mg | ORAL_TABLET | Freq: Once | ORAL | Status: AC
  Administered 2017-10-20: 1 mg via ORAL
  Filled 2017-10-20: qty 1

## 2017-10-20 NOTE — Progress Notes (Signed)
Pt ambulating to the bathroom with walker and standby assist. Definitely needs standby assist, unsteady on feet. Son was in visiting today.

## 2017-10-20 NOTE — Progress Notes (Signed)
[place] for warfarin Indication: atrial fibrillation  Allergies  Allergen Reactions  . Ace Inhibitors Other (See Comments)    Intolerance per Dr. Doug Sou note  . Amlodipine Other (See Comments)    Intolerance per Dr. Doug Sou note   . Statins Other (See Comments)    Muscle soreness  . Sulfa Drugs Cross Reactors Itching  . Zetia [Ezetimibe] Other (See Comments)    Muscle soreness  . Fenofibrate Other (See Comments)    Muscle soreness   Vital Signs: Temp: 97.3 F (36.3 C) (10/21 0605) Temp Source: Oral (10/21 0605) BP: 98/79 (10/21 0605) Pulse Rate: 80 (10/21 0605)  Labs:  Recent Labs  10/18/17 0409 10/19/17 0346 10/20/17 0425  HGB 10.8* 10.8*  --   HCT 34.4* 33.9*  --   PLT 259 280  --   LABPROT 27.3* 29.0* 29.6*  INR 2.56 2.77 2.84  CREATININE 1.38* 1.41*  --    Estimated Creatinine Clearance: 36.2 mL/min (A) (by C-G formula based on SCr of 1.41 mg/dL (H)).  Medications:  Scheduled:  . colchicine  0.3 mg Oral Daily  . febuxostat  40 mg Oral Daily  . feeding supplement (GLUCERNA SHAKE)  237 mL Oral TID BM  . furosemide  40 mg Oral BID  . metFORMIN  500 mg Oral Q breakfast  . metolazone  2.5 mg Oral Daily  . pantoprazole  40 mg Oral Daily  . potassium chloride  20 mEq Oral Daily  . sodium chloride flush  3 mL Intravenous Q12H  . Warfarin - Pharmacist Dosing Inpatient   Does not apply q1800   Infusions:  . sodium chloride 1,000 mL (10/19/17 0533)  . cefTRIAXone (ROCEPHIN)  IV Stopped (10/19/17 2252)   Assessment: 41 yoF c/o right thigh and leg redness and pain on chronic warfarin for A-fib.  Home Warfarin: 5 mg on Sun/Tu/Thu/Sat, and 2.5 mg on M/W/F.  INR=3.38 on admission.  LD 10/16 1800  Today, 10/20/2017  INR therapeutic, but increasing towards upper end on home regimen (2.84)  Diet: Eating 30-80% of meal tray  CBC: Hg slightly low, but stable  No bleeding reported  DDI: Broad spectrum antibiotics  (Ceftriaxone) may increase INR  Goal of Therapy:  INR 2-3   Plan:   Decrease Warfarin 1mg  today at 1800 to avoid supra-therapeutic INR  Daily PT/INR  Monitor CBC, s/sx bleed  [name], PharmD, BCPS Pager: [phone] 10/20/2017, 10:46 AM

## 2017-10-20 NOTE — Progress Notes (Signed)
PROGRESS NOTE    [name]  SKA:[id number] DOB: 02/16/1933 DOA: 10/16/2017 PCP: [name], MD   Brief Narrative: 83 y.o.femalewith medical history significant of CAD, diastolic CHF, pulmonary HTN, NSTEMI, HTN and atrial fibrillation on coumadin DM2  Presented with 3 day hx of Right inner thigh swelling and pain feels that the lump for past 3 days also painful she have had bilateral leg edema but since to be worse in the right patient presented to PCP she's been taking Lasix 40 twice a day as she supposed toas known history of atrial fibrillation states she still uses her Coumadin. No travel no fevers or chills. She ahs hx of chronic bilateral leg ulcers and have had home health care for these 2 times a week   Regarding pertinent Chronic problems: hxofCAD sp CABG, hx of CHF last Echo 09/2018howing preserved EF evidence of concentric hypertrophy. Patient seen today,complains of lower leg pain though the pain is better.  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  I will add metolazone as she was taking at home.  Continue Rocephin.  Add vanc .ibuprofen really dont see much improvement.  CKD stable monitor renal functions on diuretics.  History of CAD CABG chronic CHF with preserved ejection fraction-continue diuretics  Gout on colchicine and febuxostat  Type 2 diabetes sliding scale insulin for now DC metformin  Atrial fibrillation rate controlled continue Coumadin pharmacy following levels of INR     DVT  prophylaxis: coumadin Code Status: full Family Communication:none Disposition Plan: PT consult Continue Rocephin [place] placement at the time of discharge  Consultants:  None Procedures: None Antimicrobials: Rocephin    Subjective: Pain is better but continues to have swelling  Objective: Vitals:   10/19/17 1410 10/19/17 1429 10/19/17 2121 10/20/17 0605  BP: (!) 78/50 (!) 91/52 94/68 98/79   Pulse: 83  72 80  Resp: 16  20 20   Temp: 98.4 F (36.9 C)  97.6 F (36.4 C) (!) 97.3 F (36.3 C)  TempSrc: Oral  Oral Oral  SpO2: 97%  100% 100%    Intake/Output Summary (Last 24 hours) at 10/20/17 1216 Last data filed at 10/20/17 1029  Gross per 24 hour  Intake          1378.33 ml  Output              700 ml  Net           678.33 ml   There were no vitals filed for this visit.  Examination:  General exam: Appears calm and comfortable  Respiratory system: Clear to auscultation. Respiratory effort normal. Cardiovascular system: S1 & S2 heard, RRR. No JVD, murmurs, rubs, gallops or clicks. No pedal edema. Gastrointestinal system: Abdomen is nondistended, soft and nontender. No organomegaly or masses felt. Normal bowel sounds heard. Central nervous system: Alert and oriented. No focal neurological deficits. Skin: No rashes, lesions or ulcers Psychiatry: Judgement and insight appear normal. Mood & affect appropriate.   Extremity.Marland Kitchenedema upto thighs.erythema inner right thigh.tender  to touch.  Data Reviewed: I have personally reviewed following labs and imaging studies  CBC:  Recent Labs Lab 10/16/17 1914 10/17/17 0440 10/18/17 0409 10/19/17 0346  WBC 10.7* 9.1 7.0 7.2  HGB 11.9* 11.2* 10.8* 10.8*  HCT 36.8 35.2* 34.4* 33.9*  MCV 81.1 81.7 82.1 81.3  PLT 317 274 259 626   Basic Metabolic Panel:  Recent Labs Lab 10/16/17 1914 10/17/17 0440 10/18/17 0409 10/19/17 0346  NA 133* 131* 130* 132*  K 3.8 4.2 3.7 3.5  CL 92* 95* 92* 95*  CO2 24 24 24  24   GLUCOSE 119* 102* 102* 105*  BUN 58* 55* 56* 56*  CREATININE 1.33* 1.30* 1.38* 1.41*  CALCIUM 9.1 8.8* 8.5* 8.5*  MG 1.5* 1.9  --   --   PHOS  --  3.5  --   --    GFR: Estimated Creatinine Clearance: 36.2 mL/min (A) (by C-G formula based on SCr of 1.41 mg/dL (H)). Liver Function Tests:  Recent Labs Lab 10/17/17 0440  AST 21  ALT 10*  ALKPHOS 107  BILITOT 1.6*  PROT 6.7  ALBUMIN 3.3*   No results for input(s): LIPASE, AMYLASE in the last 168 hours. No results for input(s): AMMONIA in the last 168 hours. Coagulation Profile:  Recent Labs Lab 10/16/17 2120 10/17/17 0822 10/18/17 0409 10/19/17 0346 10/20/17 0425  INR 3.38 2.59 2.56 2.77 2.84   Cardiac Enzymes: No results for input(s): CKTOTAL, CKMB, CKMBINDEX, TROPONINI in the last 168 hours. BNP (last 3 results) No results for input(s): PROBNP in the last 8760 hours. HbA1C: No results for input(s): HGBA1C in the last 72 hours. CBG:  Recent Labs Lab 10/19/17 0817 10/19/17 1202 10/19/17 1722 10/19/17 2111 10/20/17 0753  GLUCAP 129* 88 92 117* 107*   Lipid Profile: No results for input(s): CHOL, HDL, LDLCALC, TRIG, CHOLHDL, LDLDIRECT in the last 72 hours. Thyroid Function Tests: No results for input(s): TSH, T4TOTAL, FREET4, T3FREE, THYROIDAB in the last 72 hours. Anemia Panel: No results for input(s): VITAMINB12, FOLATE, FERRITIN, TIBC, IRON, RETICCTPCT in the last 72 hours. Sepsis Labs:  Recent Labs Lab 10/16/17 2120  LATICACIDVEN 1.9    Recent Results (from the past 240 hour(s))  MRSA PCR Screening     Status: None   Collection Time: 10/16/17 11:47 PM  Result Value Ref Range Status   MRSA by PCR NEGATIVE NEGATIVE Final    Comment:        The GeneXpert MRSA Assay (FDA approved for NASAL specimens only), is one component of a comprehensive MRSA colonization surveillance program. It is not intended to diagnose MRSA infection nor to guide or monitor treatment for MRSA infections.           Radiology Studies: No results found.      Scheduled Meds: . colchicine  0.3 mg Oral Daily  . febuxostat  40 mg Oral Daily  . feeding supplement (GLUCERNA SHAKE)  237 mL Oral TID BM  . furosemide  40 mg Oral BID  . metFORMIN  500 mg Oral Q breakfast  . metolazone  2.5 mg Oral Daily  . pantoprazole  40 mg Oral Daily  . potassium chloride  20 mEq Oral Daily  . sodium chloride flush  3 mL Intravenous Q12H  . warfarin  1 mg Oral ONCE-1800  . Warfarin - Pharmacist Dosing Inpatient   Does not apply q1800   Continuous Infusions: . sodium chloride 1,000 mL (10/19/17 0533)  . cefTRIAXone (ROCEPHIN)  IV Stopped (10/19/17 2252)  LOS: 4 days        Georgette Shell, MD [facility]  If 7PM-7AM, please contact night-coverage [email or web address] Password [email or web address] 10/20/2017, 12:16 PM

## 2017-10-21 LAB — GLUCOSE, CAPILLARY
GLUCOSE-CAPILLARY: 136 mg/dL — AB (ref 65–99)
GLUCOSE-CAPILLARY: 106 mg/dL — AB (ref 65–99)
GLUCOSE-CAPILLARY: 158 mg/dL — AB (ref 65–99)
Glucose-Capillary: 104 mg/dL — ABNORMAL HIGH (ref 65–99)

## 2017-10-21 LAB — CBC
HEMATOCRIT: 34.6 % — AB (ref 36.0–46.0)
HEMOGLOBIN: 10.9 g/dL — AB (ref 12.0–15.0)
MCH: 25.7 pg — ABNORMAL LOW (ref 26.0–34.0)
MCHC: 31.5 g/dL (ref 30.0–36.0)
MCV: 81.6 fL (ref 78.0–100.0)
Platelets: 292 10*3/uL (ref 150–400)
RBC: 4.24 MIL/uL (ref 3.87–5.11)
RDW: 19.9 % — AB (ref 11.5–15.5)
WBC: 7.7 10*3/uL (ref 4.0–10.5)

## 2017-10-21 LAB — BASIC METABOLIC PANEL
ANION GAP: 10 (ref 5–15)
BUN: 60 mg/dL — ABNORMAL HIGH (ref 6–20)
CO2: 28 mmol/L (ref 22–32)
Calcium: 8.7 mg/dL — ABNORMAL LOW (ref 8.9–10.3)
Chloride: 98 mmol/L — ABNORMAL LOW (ref 101–111)
Creatinine, Ser: 1.36 mg/dL — ABNORMAL HIGH (ref 0.44–1.00)
GFR, EST AFRICAN AMERICAN: 41 mL/min — AB (ref >60)
GFR, EST NON AFRICAN AMERICAN: 35 mL/min — AB (ref >60)
Glucose, Bld: 110 mg/dL — ABNORMAL HIGH (ref 65–99)
POTASSIUM: 3.3 mmol/L — AB (ref 3.5–5.1)
Sodium: 136 mmol/L (ref 135–145)

## 2017-10-21 LAB — PROTIME-INR
INR: 2.83
Prothrombin Time: 29.6 seconds — ABNORMAL HIGH (ref 11.4–15.2)

## 2017-10-21 MED ORDER — WARFARIN SODIUM 2.5 MG PO TABS
2.5000 mg | ORAL_TABLET | Freq: Once | ORAL | Status: AC
  Administered 2017-10-21: 2.5 mg via ORAL
  Filled 2017-10-21: qty 1

## 2017-10-21 NOTE — Progress Notes (Signed)
Occupational Therapy Treatment Patient Details Name: DELORESE SELLIN MRN: [id number] DOB: 07/01/1933 Today's Date: 10/21/2017    History of present illness 83 yo female with onset of pulm edema and effusion with admission for Leg  edema.  Has been treated for LE wounds   PMHx:  DM, CHF, CAD with stent, HTN, HLD, a-fib, OA, spinal stenosis   OT comments  Pt will need ST rehab prior to returning to ILF  Follow Up Recommendations  SNF (pt wants to return to I living; [facility] if there)    Equipment Recommendations  None recommended by OT    Recommendations for Other Services      Precautions / Restrictions Precautions Precautions: Fall Restrictions Weight Bearing Restrictions: No       Mobility Bed Mobility Overal bed mobility: Needs Assistance Bed Mobility: Sit to Supine     Supine to sit: Min guard;Min assist Sit to supine: Min assist   General bed mobility comments: HOB elevated and increased time  Transfers Overall transfer level: Needs assistance Equipment used: Rolling walker (2 wheeled) Transfers: Sit to/from [facility] Sit to Stand: Min assist Stand pivot transfers: Min assist       General transfer comment: increased time and safety with turns        ADL either performed or assessed with clinical judgement   ADL Overall ADL's : Needs assistance/impaired                         Toilet Transfer: Minimal assistance;Stand-pivot;BSC;RW   Toileting- Clothing Manipulation and Hygiene: Sitting/lateral lean;Sit to/from stand;Minimal assistance                         Cognition Arousal/Alertness: Awake/alert Behavior During Therapy: WFL for tasks assessed/performed Overall Cognitive Status: Within Functional Limits for tasks assessed                                                     Pertinent Vitals/ Pain       Pain Assessment: Faces Faces Pain Scale: Hurts a little bit Pain Location: both lower  legs Pain Descriptors / Indicators: Discomfort Pain Intervention(s): Limited activity within patient's tolerance  Home Living                                          Prior Functioning/Environment              Frequency  Min 2X/week        Progress Toward Goals  OT Goals(current goals can now be found in the care plan section)  Progress towards OT goals: Progressing toward goals     Plan Discharge plan remains appropriate    Co-evaluation                 AM-PAC PT "6 Clicks" Daily Activity     Outcome Measure   Help from another person eating meals?: None Help from another person taking care of personal grooming?: A Little Help from another person toileting, which includes using toliet, bedpan, or urinal?: A Little Help from another person bathing (including washing, rinsing, drying)?: A Lot Help from another person to put on and taking off regular  upper body clothing?: A Little Help from another person to put on and taking off regular lower body clothing?: A Lot 6 Click Score: 17    End of Session    OT Visit Diagnosis: Unsteadiness on feet (R26.81)   Activity Tolerance Patient tolerated treatment well   Patient Left in chair;with call bell/phone within reach;with chair alarm set   Nurse Communication          Time: 0600-4599 OT Time Calculation (min): 21 min  Charges: OT General Charges $OT Visit: 1 Visit OT Treatments $Self Care/Home Management : 8-22 mins  [place], [place]   Betsy Pries 10/21/2017, 4:28 PM

## 2017-10-21 NOTE — Progress Notes (Signed)
Dressing changed to both legs. Lt leg weeping and had some bloody drainage. Cleansed with normal Saline and dressed as ordered. She complained of increased pain  When changing the dressing tonight. The LT. Leg remains very red. RT leg looking a little less reddened and no drainage. The Rt thigh remains very red, no drainage.

## 2017-10-21 NOTE — Progress Notes (Signed)
LCSW made aware that patient is from [place].  Patient recommended Home Health and maybe SNF , but not agreeable to SNF. Reports she wants to return to her ILF at discharge with home health.  Call placed to Babetta at [facility] and notified. She reports that the SW for ILF is not in the building today, however if she needs SNF they can accommodate, but if needing home health then this needs to be set up at the hospital as they do not offer this service. LCSW updated CM.  Available if patient changes her mind and wants short term rehab. At this time, will anticipate return to ILF with home health.  Lane Hacker, MSW Clinical Social Work: [employer] Coverage for :  [phone]

## 2017-10-21 NOTE — Care Management Important Message (Signed)
Important Message  Patient Details  Name: NETANYA YAZDANI MRN: [id number] Date of Birth: 08-08-1933   Medicare Important Message Given:  Yes    Kerin Salen 10/21/2017, 1:30 [place] Message  Patient Details  Name: JOHNNY LATU MRN: [id number] Date of Birth: 12-16-1933   Medicare Important Message Given:  Yes    Kerin Salen 10/21/2017, 1:30 PM

## 2017-10-21 NOTE — Progress Notes (Signed)
PROGRESS NOTE    [name]  IHK:[id number] DOB: 06/17/1933 DOA: 10/16/2017 PCP: [name], MD  Brief Narrative: 83 y.o.femalewith medical history significant of CAD, diastolic CHF, pulmonary HTN, NSTEMI, HTN and atrial fibrillation on coumadin DM2  Presented with 3 day hx of Right inner thigh swelling and pain feels that the lump for past 3 days also painful she have had bilateral leg edema but since to be worse in the right patient presented to PCP she's been taking Lasix 40 twice a day as she supposed toas known history of atrial fibrillation states she still uses her Coumadin. No travel no fevers or chills. She ahs hx of chronic bilateral leg ulcers and have had home health care for these 2 times a week   Regarding pertinent Chronic problems: hxofCAD sp CABG, hx of CHF last Echo 09/2018howing preserved EF evidence of concentric hypertrophy. Patient seen today,complains of lower leg pain though the pain is better.  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  She has been started back on her Lasix and metolazone.  Continue rocephin for now she is making slow progress.  She is afebrile with normal Wbc.  Urine culture looks contaminated. Hypokalemia replete potassium CKD stage III stable History of CABG CHF with preserved ejection fraction continue Lasix and metolazone Type 2 diabetes blood sugar stable has been less than 136.  She takes metformin at home which has been on hold at this time. History  of gout continue uloric and colchicine Chronic atrial fibrillation on Coumadin follow-up pharmacy   DVT prophylaxis: lovenox Code Status:full Family Communication:none Disposition Plan: If remains afebrile with decreased WBC count will plan on discharge in 48-72 hours.   Consultants: None   Procedures none  : Antimicrobials: Rocephin   Subjective: Feels better with decreased swelling but still have complaints of pain especially in the right upper thigh.   Objective: Vitals:   10/20/17 0605 10/20/17 1400 10/20/17 2149 10/21/17 0605  BP: 98/79 (!) 89/48 109/82 92/60  Pulse: 80 75 60 80  Resp: 20 18 16 16   Temp: (!) 97.3 F (36.3 C) 98.1 F (36.7 C) 97.8 F (36.6 C) (!) 96.8 F (36 C)  TempSrc: Oral Oral Oral Axillary  SpO2: 100% 99% 99% 100%  Weight:    91.3 kg (201 lb 4.5 oz)    Intake/Output Summary (Last 24 hours) at 10/21/17 1416 Last data filed at 10/21/17 1139  Gross per 24 hour  Intake           358.67 ml  Output              600 ml  Net          -241.33 ml   Filed Weights   10/21/17 0605  Weight: 91.3 kg (201 lb 4.5 oz)    Examination:  General exam: Appears calm and comfortable  Respiratory system: Clear to auscultation. Respiratory effort normal. Cardiovascular system: S1 & S2 heard, RRR. No JVD, murmurs, rubs, gallops or clicks. No pedal edema. Gastrointestinal  system: Abdomen is nondistended, soft and nontender. No organomegaly or masses felt. Normal bowel sounds heard. Central nervous system: Alert and oriented. No focal neurological deficits. Extremities:edema decreased erythema plus Skin: No rashes, lesions or ulcers Psychiatry: Judgement and insight appear normal. Mood & affect appropriate.     Data Reviewed: I have personally reviewed following labs and imaging studies  CBC:  Recent Labs Lab 10/16/17 1914 10/17/17 0440 10/18/17 0409 10/19/17 0346 10/21/17 0417  WBC 10.7* 9.1 7.0 7.2 7.7  HGB 11.9* 11.2* 10.8* 10.8* 10.9*  HCT  36.8 35.2* 34.4* 33.9* 34.6*  MCV 81.1 81.7 82.1 81.3 81.6  PLT 317 274 259 280 630   Basic Metabolic Panel:  Recent Labs Lab 10/16/17 1914 10/17/17 0440 10/18/17 0409 10/19/17 0346 10/21/17 0417  NA 133* 131* 130* 132* 136  K 3.8 4.2 3.7 3.5 3.3*  CL 92* 95* 92* 95* 98*  CO2 24 24 24 24 28   GLUCOSE 119* 102* 102* 105* 110*  BUN 58* 55* 56* 56* 60*  CREATININE 1.33* 1.30* 1.38* 1.41* 1.36*  CALCIUM 9.1 8.8* 8.5* 8.5* 8.7*  MG 1.5* 1.9  --   --   --   PHOS  --  3.5  --   --   --    GFR: Estimated Creatinine Clearance: 37.7 mL/min (A) (by C-G formula based on SCr of 1.36 mg/dL (H)). Liver Function Tests:  Recent Labs Lab 10/17/17 0440  AST 21  ALT 10*  ALKPHOS 107  BILITOT 1.6*  PROT 6.7  ALBUMIN 3.3*   No results for input(s): LIPASE, AMYLASE in the last 168 hours. No results for input(s): AMMONIA in the last 168 hours. Coagulation Profile:  Recent Labs Lab 10/17/17 0822 10/18/17 0409 10/19/17 0346 10/20/17 0425 10/21/17 0417  INR 2.59 2.56 2.77 2.84 2.83   Cardiac Enzymes: No results for input(s): CKTOTAL, CKMB, CKMBINDEX, TROPONINI in the last 168 hours. BNP (last 3 results) No results for input(s): PROBNP in the last 8760 hours. HbA1C: No results for input(s): HGBA1C in the last 72 hours. CBG:  Recent Labs Lab 10/20/17 1213 10/20/17 1644 10/20/17 2151 10/21/17 0754 10/21/17 1203  GLUCAP 89 113* 121* 104* 136*   Lipid Profile: No results for input(s): CHOL, HDL, LDLCALC, TRIG, CHOLHDL, LDLDIRECT in the last 72 hours. Thyroid Function Tests: No results for input(s): TSH, T4TOTAL, FREET4, T3FREE, THYROIDAB in the last 72 hours. Anemia Panel: No results for input(s): VITAMINB12, FOLATE, FERRITIN, TIBC, IRON, RETICCTPCT in the last 72 hours. Sepsis Labs:  Recent Labs Lab 10/16/17 2120  LATICACIDVEN 1.9    Recent Results (from the past 240 hour(s))  MRSA PCR Screening     Status: None   Collection Time: 10/16/17 11:47 PM  Result Value  Ref Range Status   MRSA by PCR NEGATIVE NEGATIVE Final    Comment:        The GeneXpert MRSA Assay (FDA approved for NASAL specimens only), is one component of a comprehensive MRSA colonization surveillance program. It is not intended to diagnose MRSA infection nor to guide or monitor treatment for MRSA infections.          Radiology Studies: No results found.      Scheduled Meds: . colchicine  0.3 mg Oral Daily  . febuxostat  40 mg Oral Daily  . feeding supplement (GLUCERNA SHAKE)  237 mL Oral TID BM  . furosemide  40 mg Oral BID  . metolazone  2.5 mg Oral Daily  . pantoprazole  40 mg Oral Daily  .  potassium chloride  20 mEq Oral Daily  . sodium chloride flush  3 mL Intravenous Q12H  . warfarin  2.5 mg Oral ONCE-1800  . Warfarin - Pharmacist Dosing Inpatient   Does not apply q1800   Continuous Infusions: . sodium chloride 1,000 mL (10/19/17 0533)  . cefTRIAXone (ROCEPHIN)  IV Stopped (10/20/17 2114)     LOS: 5 days      Georgette Shell, MD [facility]   If 7PM-7AM, please contact night-coverage [email or web address] Password [email or web address] 10/21/2017, 2:16 PM

## 2017-10-21 NOTE — Progress Notes (Signed)
[facility] for warfarin Indication: atrial fibrillation  Allergies  Allergen Reactions  . Ace Inhibitors Other (See Comments)    Intolerance per Dr. Doug Sou note  . Amlodipine Other (See Comments)    Intolerance per Dr. Doug Sou note   . Statins Other (See Comments)    Muscle soreness  . Sulfa Drugs Cross Reactors Itching  . Zetia [Ezetimibe] Other (See Comments)    Muscle soreness  . Fenofibrate Other (See Comments)    Muscle soreness   Vital Signs: Temp: 96.8 F (36 C) (10/22 0605) Temp Source: Axillary (10/22 0605) BP: 92/60 (10/22 0605) Pulse Rate: 80 (10/22 0605)  Labs:  Recent Labs  10/19/17 0346 10/20/17 0425 10/21/17 0417  HGB 10.8*  --  10.9*  HCT 33.9*  --  34.6*  PLT 280  --  292  LABPROT 29.0* 29.6* 29.6*  INR 2.77 2.84 2.83  CREATININE 1.41*  --  1.36*   Estimated Creatinine Clearance: 37.7 mL/min (A) (by C-G formula based on SCr of 1.36 mg/dL (H)).  Medications:  Scheduled:  . colchicine  0.3 mg Oral Daily  . febuxostat  40 mg Oral Daily  . feeding supplement (GLUCERNA SHAKE)  237 mL Oral TID BM  . furosemide  40 mg Oral BID  . metolazone  2.5 mg Oral Daily  . pantoprazole  40 mg Oral Daily  . potassium chloride  20 mEq Oral Daily  . sodium chloride flush  3 mL Intravenous Q12H  . Warfarin - Pharmacist Dosing Inpatient   Does not apply q1800   Infusions:  . sodium chloride 1,000 mL (10/19/17 0533)  . cefTRIAXone (ROCEPHIN)  IV Stopped (10/20/17 2114)   Assessment: 43 yoF c/o right thigh and leg redness and pain on chronic warfarin for A-fib.  Home Warfarin: 5 mg on Sun/Tu/Thu/Sat, and 2.5 mg on M/W/F.  INR=3.38 on admission.  LD 10/16 1800  Today, 10/21/2017  INR therapeutic, 2.83  Diet: Eating 25-100% of meal tray  CBC: Hg slightly low, but stable  No bleeding reported  DDI: Broad spectrum antibiotics (Ceftriaxone) may increase INR  Goal of Therapy:  INR 2-3   Plan:   Warfarin 2.5mg  po x  1 per home regiment  Daily PT/INR  Monitor CBC, s/sx bleed  Dolly Rias RPh 10/21/2017, 9:58 AM Pager [phone]

## 2017-10-21 NOTE — Progress Notes (Signed)
Physical Therapy Treatment Patient Details Name: KIEARRA OYERVIDES MRN: [id number] DOB: [birth year] Today's Date: 10/21/2017    History of Present Illness 83 yo female with onset of pulm edema and effusion with admission for Leg  edema.  Has been treated for LE wounds   PMHx:  DM, CHF, CAD with stent, HTN, HLD, a-fib, OA, spinal stenosis    PT Comments    Assisted OOB to bathroom then amb in hallway.  See mobility details below.   Follow Up Recommendations  Home health PT ([facility])     Equipment Recommendations       Recommendations for Other Services       Precautions / Restrictions Precautions Precautions: Fall Restrictions Weight Bearing Restrictions: No    Mobility  Bed Mobility Overal bed mobility: Needs Assistance Bed Mobility: Supine to Sit     Supine to sit: Min guard;Min assist     General bed mobility comments: HOB elevated and increased time  Transfers Overall transfer level: Needs assistance Equipment used: Rolling walker (2 wheeled) Transfers: Sit to/from Stand Sit to Stand: Min guard;Supervision         General transfer comment: increased time and safety with turns  Ambulation/Gait Ambulation/Gait assistance: Supervision;Min guard Ambulation Distance (Feet): 85 Feet Assistive device: Rolling walker (2 wheeled) (personal walker ) Gait Pattern/deviations: Step-to pattern;Step-through pattern;Trunk flexed Gait velocity: decreased   General Gait Details: 25% VC's upright posture and safety negociating around obsticles   Stairs            Wheelchair Mobility    Modified Rankin (Stroke Patients Only)       Balance                                            Cognition Arousal/Alertness: Awake/alert Behavior During Therapy: WFL for tasks assessed/performed Overall Cognitive Status: Within Functional Limits for tasks assessed                                        Exercises      General  Comments        Pertinent Vitals/Pain Pain Assessment: Faces Faces Pain Scale: Hurts little more Pain Location: both lower legs Pain Descriptors / Indicators: Tender Pain Intervention(s): Monitored during session;Repositioned    Home Living                      Prior Function            PT Goals (current goals can now be found in the care plan section) Progress towards PT goals: Progressing toward goals    Frequency    Min 3X/week      PT Plan Current plan remains appropriate    Co-evaluation              AM-PAC PT "6 Clicks" Daily Activity  Outcome Measure  Difficulty turning over in bed (including adjusting bedclothes, sheets and blankets)?: A Little Difficulty moving from lying on back to sitting on the side of the bed? : A Little Difficulty sitting down on and standing up from a chair with arms (e.g., wheelchair, bedside commode, etc,.)?: A Lot Help needed moving to and from a bed to chair (including a wheelchair)?: A Lot Help needed walking in hospital room?: A Lot  Help needed climbing 3-5 steps with a railing? : Total 6 Click Score: 13    End of Session Equipment Utilized During Treatment: Gait belt Activity Tolerance: Patient tolerated treatment well Patient left: in chair;with chair alarm set Nurse Communication: Mobility status PT Visit Diagnosis: Unsteadiness on feet (R26.81);History of falling (Z91.81)     Time: 5427-0623 PT Time Calculation (min) (ACUTE ONLY): 23 min  Charges:  $Gait Training: 8-22 mins $Therapeutic Activity: 8-22 mins                    G Codes:       Rica Koyanagi  PTA [facility]  Acute  Rehab Pager      [phone]

## 2017-10-22 DIAGNOSIS — R52 Pain, unspecified: Secondary | ICD-10-CM

## 2017-10-22 LAB — PROTIME-INR
INR: 2.7
Prothrombin Time: 28.5 seconds — ABNORMAL HIGH (ref 11.4–15.2)

## 2017-10-22 MED ORDER — SODIUM CHLORIDE 0.9 % IV SOLN
2000.0000 mg | Freq: Once | INTRAVENOUS | Status: AC
  Administered 2017-10-22: 2000 mg via INTRAVENOUS
  Filled 2017-10-22: qty 2000

## 2017-10-22 MED ORDER — VANCOMYCIN HCL IN DEXTROSE 1-5 GM/200ML-% IV SOLN
1000.0000 mg | INTRAVENOUS | Status: DC
Start: 2017-10-23 — End: 2017-10-25
  Administered 2017-10-23 – 2017-10-24 (×2): 1000 mg via INTRAVENOUS
  Filled 2017-10-22 (×3): qty 200

## 2017-10-22 MED ORDER — WARFARIN SODIUM 5 MG PO TABS
5.0000 mg | ORAL_TABLET | Freq: Once | ORAL | Status: AC
  Administered 2017-10-22: 5 mg via ORAL
  Filled 2017-10-22: qty 1

## 2017-10-22 NOTE — Progress Notes (Signed)
Pharmacy Antibiotic Note  [name] is a 83 y.o. female admitted on 10/16/2017 with cellulitis.  Pharmacy has been consulted for Vancomycin dosing since clinically not improving on ceftriaxone alone.  Today, 10/22/2017: D7 Ceftriaxone Afebrile Pt reports increasing pain, erythema remains the same 10/22 Labs SCr 1.36, WBC remains WNL   Plan:  Vancomycin 2g IV x1 then 1g IV q24h.  Measure Vanc trough at steady state.  Follow up renal fxn, culture results, and clinical course.   Weight: 197 lb 5 oz (89.5 kg)  Temp (24hrs), Avg:98.2 F (36.8 C), Min:97.7 F (36.5 C), Max:98.9 F (37.2 C)   Recent Labs Lab 10/16/17 1914 10/16/17 2120 10/17/17 0440 10/18/17 0409 10/19/17 0346 10/21/17 0417  WBC 10.7*  --  9.1 7.0 7.2 7.7  CREATININE 1.33*  --  1.30* 1.38* 1.41* 1.36*  LATICACIDVEN  --  1.9  --   --   --   --     Estimated Creatinine Clearance: 37.3 mL/min (A) (by C-G formula based on SCr of 1.36 mg/dL (H)).    Allergies  Allergen Reactions  . Ace Inhibitors Other (See Comments)    Intolerance per Dr. Doug Sou note  . Amlodipine Other (See Comments)    Intolerance per Dr. Doug Sou note   . Statins Other (See Comments)    Muscle soreness  . Sulfa Drugs Cross Reactors Itching  . Zetia [Ezetimibe] Other (See Comments)    Muscle soreness  . Fenofibrate Other (See Comments)    Muscle soreness    Antimicrobials this admission: 10/17 Ceftriaxone >>  10/23 Vancomycin >>   Dose adjustments this admission:   Microbiology results: 10/17 MRSA PCR: negative  Thank you for allowing pharmacy to be a part of this patient's care.  Gretta Arab PharmD, BCPS Pager [phone] 10/22/2017 3:14 PM

## 2017-10-22 NOTE — Consult Note (Signed)
Pt is an active pt with [facility]-- A home based palliative care program provided by [facility]. Pt and pt's daughter report she will be going back to the SNF at [facility]. She had been living in independent living apartment there. However, verbalizes today needing more help and that she is hoping to go there and get stronger. [facility] will follow up until discharge. However if pt discharges to SNF then she will no longer be followed by [facility]. Pt is aware of this and although it saddens her she is accepting of this. Please call with questions or concerns Webb Silversmith RN   Pt RN with [facility] is Jeanne Ivan  [phone]

## 2017-10-22 NOTE — Progress Notes (Signed)
PROGRESS NOTE    [name]  IEP:[id number] DOB: 08/07/1933 DOA: 10/16/2017 PCP: [name], MD  Brief Narrative:  83 y.o.femalewith medical history significant of CAD, diastolic CHF, pulmonary HTN, NSTEMI, HTN and atrial fibrillation on coumadin DM2  Presented with 3 day hx of Right inner thigh swelling and pain feels that the lump for past 3 days also painful she have had bilateral leg edema but since to be worse in the right patient presented to PCP she's been taking Lasix 40 twice a day as she supposed toas known history of atrial fibrillation states she still uses her Coumadin. No travel no fevers or chills. She ahs hx of chronic bilateral leg ulcers and have had home health care for these 2 times a week   Regarding pertinent Chronic problems: hxofCAD sp CABG, hx of CHF last Echo 09/2018howing preserved EF evidence of concentric hypertrophy. Patient seen today,complains of lower leg pain though the pain is better.  10/22/2017-patient has been on ceftriaxone since the day of admission she has received 6 doses so far.  She has not made any significant improvement.  She does still complain of increasing pain though her erythema almost remains the same.  I have reviewed the hospice notes.  Patient was on outpatient palliative care.  She would like to go back to skilled nursing facility so her hospice privileges were revoked at this time because of her decision to go to skilled nursing facility.   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  She has been started back on her Lasix and metolazone.  Continue rocephin.  And vancomycin to cover MRSA.  She has been not she has not made any significant improvement so far.  Pharmacy to follow Vanco levels..  She is afebrile with normal Wbc.  Urine culture looks contaminated.  An ultrasound of her right upper thigh has been ordered for today.  If it does show any signs of deep infection she will need a CT of the thigh. Hypokalemia replete potassium CKD stage III stable History of CABG CHF with preserved ejection fraction continue Lasix and metolazone Type 2 diabetes blood sugar stable has been less than 136.  She takes metformin at home which has been on hold at this time. History of gout continue uloric and colchicine Chronic atrial fibrillation on Coumadin follow-up pharmacy History of high blood pressure she is only on Lasix and metolazone was added 2 days ago.  Blood pressure low to low normal will DC melatonin continue Lasix and monitor closely     DVT prophylaxis: Coumadin Code Status: Full code Family Communication: None Disposition Plan:tbd.  Will need to go to skilled nursing facility upon discharge. Consultants: None  Procedures none  Antimicrobials: Rocephin vancomycin to be started on 10/22/2017.  Subjective:  Complains of pain in both lower extremity right more than left Objective: Vitals:   10/22/17 1056 10/22/17 1335 10/22/17 1336 10/22/17 1353  BP: 98/60 (!) 78/60 (!) 71/59 112/70  Pulse:  67    Resp:  Temp:  97.8 F (36.6 C)    TempSrc:  Oral    SpO2: 97% 93%    Weight:        Intake/Output Summary (Last 24 hours) at 10/22/17 1458 Last data filed at 10/22/17 1300  Gross per 24 hour  Intake             1920 ml  Output                0 ml  Net             1920 ml   Filed Weights   10/21/17 0605 10/22/17 0700  Weight: 91.3 kg (201 lb 4.5 oz) 89.5 kg (197 lb 5 oz)    Examination:  General exam: Appears  calm and comfortable  Respiratory system: Clear to auscultation. Respiratory effort normal. Cardiovascular system: S1 & S2 heard, RRR. No JVD, murmurs, rubs, gallops or clicks. No pedal edema. Gastrointestinal system: Abdomen is nondistended, soft and nontender. No organomegaly or masses felt. Normal bowel sounds heard. Central nervous system: Alert and oriented. No focal neurological deficits. Extremities: hard area palpated right upper inner thigh.no drainage.erythema same. Skin: No rashes, lesions or ulcers Psychiatry: Judgement and insight appear normal. Mood & affect appropriate.     Data Reviewed: I have personally reviewed following labs and imaging studies  CBC:  Recent Labs Lab 10/16/17 1914 10/17/17 0440 10/18/17 0409 10/19/17 0346 10/21/17 0417  WBC 10.7* 9.1 7.0 7.2 7.7  HGB 11.9* 11.2* 10.8* 10.8* 10.9*  HCT 36.8 35.2* 34.4* 33.9* 34.6*  MCV 81.1 81.7 82.1 81.3 81.6  PLT 317 274 259 280 622   Basic Metabolic Panel:  Recent Labs Lab 10/16/17 1914 10/17/17 0440 10/18/17 0409 10/19/17 0346 10/21/17 0417  NA 133* 131* 130* 132* 136  K 3.8 4.2 3.7 3.5 3.3*  CL 92* 95* 92* 95* 98*  CO2 24 24 24 24 28   GLUCOSE 119* 102* 102* 105* 110*  BUN 58* 55* 56* 56* 60*  CREATININE 1.33* 1.30* 1.38* 1.41* 1.36*  CALCIUM 9.1 8.8* 8.5* 8.5* 8.7*  MG 1.5* 1.9  --   --   --   PHOS  --  3.5  --   --   --    GFR: Estimated Creatinine Clearance: 37.3 mL/min (A) (by C-G formula based on SCr of 1.36 mg/dL (H)). Liver Function Tests:  Recent Labs Lab 10/17/17 0440  AST 21  ALT 10*  ALKPHOS 107  BILITOT 1.6*  PROT 6.7  ALBUMIN 3.3*   No results for input(s): LIPASE, AMYLASE in the last 168 hours. No results for input(s): AMMONIA in the last 168 hours. Coagulation Profile:  Recent Labs Lab 10/18/17 0409 10/19/17 0346 10/20/17 0425 10/21/17 0417 10/22/17 0334  INR 2.56 2.77 2.84 2.83 2.70   Cardiac Enzymes: No results for input(s): CKTOTAL, CKMB, CKMBINDEX,  TROPONINI in the last 168 hours. BNP (last 3 results) No results for input(s): PROBNP in the last 8760 hours. HbA1C: No results for input(s): HGBA1C in the last 72 hours. CBG:  Recent Labs Lab 10/20/17 2151 10/21/17 0754 10/21/17 1203 10/21/17 1650 10/21/17 2139  GLUCAP 121* 104* 136* 106* 158*   Lipid Profile: No results for input(s): CHOL, HDL, LDLCALC, TRIG, CHOLHDL, LDLDIRECT in the last 72 hours. Thyroid Function Tests: No results for input(s): TSH, T4TOTAL, FREET4, T3FREE, THYROIDAB in the last 72 hours. Anemia Panel: No results for input(s): VITAMINB12, FOLATE, FERRITIN, TIBC, IRON, RETICCTPCT in the last 72 hours. Sepsis Labs:  Recent Labs Lab 10/16/17  2120  LATICACIDVEN 1.9    Recent Results (from the past 240 hour(s))  MRSA PCR Screening     Status: None   Collection Time: 10/16/17 11:47 PM  Result Value Ref Range Status   MRSA by PCR NEGATIVE NEGATIVE Final    Comment:        The GeneXpert MRSA Assay (FDA approved for NASAL specimens only), is one component of a comprehensive MRSA colonization surveillance program. It is not intended to diagnose MRSA infection nor to guide or monitor treatment for MRSA infections.          Radiology Studies: No results found.      Scheduled Meds: . colchicine  0.3 mg Oral Daily  . febuxostat  40 mg Oral Daily  . feeding supplement (GLUCERNA SHAKE)  237 mL Oral TID BM  . furosemide  40 mg Oral BID  . metolazone  2.5 mg Oral Daily  . pantoprazole  40 mg Oral Daily  . potassium chloride  20 mEq Oral Daily  . sodium chloride flush  3 mL Intravenous Q12H  . warfarin  5 mg Oral ONCE-1800  . Warfarin - Pharmacist Dosing Inpatient   Does not apply q1800   Continuous Infusions: . sodium chloride 1,000 mL (10/19/17 0533)  . cefTRIAXone (ROCEPHIN)  IV Stopped (10/21/17 2324)     LOS: 6 days     Georgette Shell, MD [facility] If 7PM-7AM, please contact night-coverage [email or web address] Password  [email or web address] 10/22/2017, 2:58 PM

## 2017-10-22 NOTE — Progress Notes (Signed)
[place] for warfarin Indication: atrial fibrillation  Allergies  Allergen Reactions  . Ace Inhibitors Other (See Comments)    Intolerance per Dr. Doug Sou note  . Amlodipine Other (See Comments)    Intolerance per Dr. Doug Sou note   . Statins Other (See Comments)    Muscle soreness  . Sulfa Drugs Cross Reactors Itching  . Zetia [Ezetimibe] Other (See Comments)    Muscle soreness  . Fenofibrate Other (See Comments)    Muscle soreness   Vital Signs: Temp: 98.4 F (36.9 C) (10/23 0700) Temp Source: Oral (10/23 0700) BP: 86/50 (10/23 0700) Pulse Rate: 66 (10/23 0700)  Labs:  Recent Labs  10/20/17 0425 10/21/17 0417 10/22/17 0334  HGB  --  10.9*  --   HCT  --  34.6*  --   PLT  --  292  --   LABPROT 29.6* 29.6* 28.5*  INR 2.84 2.83 2.70  CREATININE  --  1.36*  --    Estimated Creatinine Clearance: 37.3 mL/min (A) (by C-G formula based on SCr of 1.36 mg/dL (H)).  Medications:  Scheduled:  . colchicine  0.3 mg Oral Daily  . febuxostat  40 mg Oral Daily  . feeding supplement (GLUCERNA SHAKE)  237 mL Oral TID BM  . furosemide  40 mg Oral BID  . metolazone  2.5 mg Oral Daily  . pantoprazole  40 mg Oral Daily  . potassium chloride  20 mEq Oral Daily  . sodium chloride flush  3 mL Intravenous Q12H  . Warfarin - Pharmacist Dosing Inpatient   Does not apply q1800   Infusions:  . sodium chloride 1,000 mL (10/19/17 0533)  . cefTRIAXone (ROCEPHIN)  IV Stopped (10/21/17 2324)   Assessment: 68 yoF admitted on 10/17 with c/o right thigh and leg redness and pain on chronic warfarin for A-fib.  Home Warfarin: 5 mg on Sun/Tu/Thu/Sat, and 2.5 mg on M/W/F.  INR=3.38 on admission.  Today, 10/22/2017  INR therapeutic, 2.7  Diet: Eating 25-100% of meal tray  CBC: Hg slightly low, but stable (10/22)  No bleeding reported  DDI: Broad spectrum antibiotics (Ceftriaxone) may increase INR  Goal of Therapy:  INR 2-3   Plan:   Warfarin  5mg  PO x1 dose at 1800 per home regimen.  Daily PT/INR  Monitor CBC, s/sx bleed   Gretta Arab PharmD, BCPS Pager [phone] 10/22/2017 7:58 AM

## 2017-10-22 NOTE — Progress Notes (Signed)
   10/22/17 1900  Clinical Encounter Type  Visited With Patient;Family;Health care provider  Visit Type Initial;Spiritual support;Social support  Referral From Patient's clergy  Consult/Referral To Chaplain  Spiritual Encounters  Spiritual Needs Emotional;Prayer  Stress Factors  Patient Stress Factors Loss of control  Family Stress Factors None identified    Chaplain was called to check in on Pt after her visit with her pastor. Doristine Bosworth was concerned and asked if a chaplain could follow up with Pt. Chaplain arrived around dinner time and shared some time with pt. Chaplain provided prayer to address Pt's emotional distress relating to her health. Chaplain provided support by being silent and listening to the Pt's story. We talked a little about meditative prayer to help with sleep. There were no family members by bedside; however daughter called and chaplain reassured the Pt's loved one that if her mom felt like she needed someone to talk to she could ask the RN to page a chaplain.  Chaplain will be more than happy to revisit tomorrow evening if needed. Please don't hesitate to page.  [place]

## 2017-10-22 NOTE — Progress Notes (Signed)
Resumed care of patient at 1500. Agree with prior RNs assessment. Will continue to monitor.  

## 2017-10-23 ENCOUNTER — Inpatient Hospital Stay (HOSPITAL_COMMUNITY): Payer: Medicare Other

## 2017-10-23 DIAGNOSIS — F320 Major depressive disorder, single episode, mild: Secondary | ICD-10-CM

## 2017-10-23 LAB — BASIC METABOLIC PANEL
ANION GAP: 12 (ref 5–15)
BUN: 50 mg/dL — ABNORMAL HIGH (ref 6–20)
CALCIUM: 8.5 mg/dL — AB (ref 8.9–10.3)
CO2: 28 mmol/L (ref 22–32)
Chloride: 97 mmol/L — ABNORMAL LOW (ref 101–111)
Creatinine, Ser: 1.18 mg/dL — ABNORMAL HIGH (ref 0.44–1.00)
GFR, EST AFRICAN AMERICAN: 48 mL/min — AB (ref >60)
GFR, EST NON AFRICAN AMERICAN: 42 mL/min — AB (ref >60)
Glucose, Bld: 106 mg/dL — ABNORMAL HIGH (ref 65–99)
POTASSIUM: 3.1 mmol/L — AB (ref 3.5–5.1)
Sodium: 137 mmol/L (ref 135–145)

## 2017-10-23 LAB — CBC
HEMATOCRIT: 33.8 % — AB (ref 36.0–46.0)
HEMOGLOBIN: 10.7 g/dL — AB (ref 12.0–15.0)
MCH: 25.9 pg — ABNORMAL LOW (ref 26.0–34.0)
MCHC: 31.7 g/dL (ref 30.0–36.0)
MCV: 81.8 fL (ref 78.0–100.0)
Platelets: 307 10*3/uL (ref 150–400)
RBC: 4.13 MIL/uL (ref 3.87–5.11)
RDW: 19.9 % — AB (ref 11.5–15.5)
WBC: 9 10*3/uL (ref 4.0–10.5)

## 2017-10-23 LAB — PROTIME-INR
INR: 2.8
Prothrombin Time: 29.3 seconds — ABNORMAL HIGH (ref 11.4–15.2)

## 2017-10-23 LAB — MAGNESIUM: Magnesium: 1.3 mg/dL — ABNORMAL LOW (ref 1.7–2.4)

## 2017-10-23 MED ORDER — FUROSEMIDE 10 MG/ML IJ SOLN
40.0000 mg | Freq: Two times a day (BID) | INTRAMUSCULAR | Status: DC
  Administered 2017-10-23 – 2017-10-27 (×8): 40 mg via INTRAVENOUS
  Filled 2017-10-23 (×8): qty 4

## 2017-10-23 MED ORDER — POTASSIUM CHLORIDE ER 10 MEQ PO TBCR
40.0000 meq | EXTENDED_RELEASE_TABLET | Freq: Two times a day (BID) | ORAL | Status: AC
  Administered 2017-10-23 – 2017-10-24 (×2): 40 meq via ORAL
  Filled 2017-10-23 (×4): qty 4

## 2017-10-23 MED ORDER — WARFARIN SODIUM 2.5 MG PO TABS
2.5000 mg | ORAL_TABLET | Freq: Once | ORAL | Status: AC
  Administered 2017-10-23: 2.5 mg via ORAL
  Filled 2017-10-23: qty 1

## 2017-10-23 NOTE — Progress Notes (Addendum)
PROGRESS NOTE    [name]  OXB:[id number] DOB: 18-Mar-1933 DOA: 10/16/2017 PCP: [name], MD  Brief Narrative:  83 y.o.femalewith medical history significant of CAD, diastolic CHF, pulmonary HTN, NSTEMI, HTN and atrial fibrillation on coumadin,DM2,   evaluation of right thigh and leg redness and pain.   Patient states the symptoms started a few days ago. She has noticed redness and increasing pain in her right leg that primarily localizes in the right medial aspect of her  . She has hx of chronic bilateral leg ulcers and have had home health care for these 2 times a week   Regarding pertinent Chronic problems: hxofCAD sp CABG, hx of CHF last Echo 08/2017 showing preserved EF evidence of concentric hypertrophy. 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  She has been started back on her Lasix and metolazone.  Continue rocephin.  And vancomycin to cover MRSA.   Patient has made only slow improvement so far.   Remains on IV vancomycin and IV ceftriaxone .  She is afebrile with normal Wbc.  Urine culture looks contaminated.   Ultrasound of the right lower extremity does not show any evidence of abscess, additional cellulitis.  Continue IV antibiotics for now, Will order CT right leg to r/o deep seated abscess. Continue wound care as per WOC recommendations  . She does have dependent edema and may benefit  from diuresis , change PO lasix to IV   Hypokalemia replete  CKD stage III stable, baseline creatinine around 1.3 stable,  History of CABG CHF with preserved ejection fraction continue Lasix, changed to iv  . At home on metolazone 2.5 mg weekly on Wednesday  NSTEMI with DES to the LCX July 2105 for restenosis. History of single vessel CABG to RCA. No recurrent angina.  Type 2 diabetes blood sugar stable has been less than 136.  She takes metformin at home which has been on hold at this time.  History of gout continue uloric and colchicine  Chronic atrial fibrillation on Coumadin follow-up pharmacy, INR therapeutic, rate is controlled   Hypertension-patient is on metoprolol, Lasix, recently started Zaroxolyn prn   Carotid arterial disease s/p right carotid stenting.     DVT prophylaxis: Coumadin Code Status: Full code Family Communication: None Disposition Plan:tbd. SNF likely in am  Consultants: None  Procedures none  Antimicrobials: Rocephin vancomycin to be started on 10/22/2017.  Subjective:  Complains of pain in both lower extremity right more than left, complaining about her renal diet and fluid restriction, she has now been changed to heart healthy,    Objective: Vitals:   10/22/17 1353 10/22/17 1946 10/23/17 0346 10/23/17 0439  BP: 112/70 115/74 102/69   Pulse:  72 71   Resp:  18 16   Temp:  97.6 F (36.4 C) 97.9 F (36.6 C)   TempSrc:  Oral Oral   SpO2:  94% 96%  Weight:    90.7 kg (200 lb)    Intake/Output Summary (Last 24 hours) at 10/23/17 1227 Last data filed at 10/23/17 1210  Gross per 24 hour  Intake              505 ml  Output             1050 ml  Net             -545 ml   Filed Weights   10/21/17 0605 10/22/17 0700 10/23/17 0439  Weight: 91.3 kg (201 lb 4.5 oz) 89.5 kg (197 lb 5 oz) 90.7 kg (200 lb)    Examination:  General exam: Appears calm and comfortable  Respiratory system: Clear to auscultation. Respiratory effort  normal. Cardiovascular system: S1 & S2 heard, RRR. No JVD, murmurs, rubs, gallops or clicks.  . Gastrointestinal system: Abdomen is nondistended, soft and nontender. No organomegaly or masses felt. Normal bowel sounds heard. Central nervous system: Alert and oriented. No focal neurological deficits. Extremities: hard area palpated right upper inner thigh.no drainage.erythema same.2 plus pittin edema  Skin: No rashes, lesions or ulcers Psychiatry: Judgement and insight appear normal. Mood & affect appropriate.     Data Reviewed: I have personally reviewed following labs and imaging studies  CBC:  Recent Labs Lab 10/17/17 0440 10/18/17 0409 10/19/17 0346 10/21/17 0417 10/23/17 0424  WBC 9.1 7.0 7.2 7.7 9.0  HGB 11.2* 10.8* 10.8* 10.9* 10.7*  HCT 35.2* 34.4* 33.9* 34.6* 33.8*  MCV 81.7 82.1 81.3 81.6 81.8  PLT 274 259 280 292 462   Basic Metabolic Panel:  Recent Labs Lab 10/16/17 1914 10/17/17 0440 10/18/17 0409 10/19/17 0346 10/21/17 0417 10/23/17 0424  NA 133* 131* 130* 132* 136 137  K 3.8 4.2 3.7 3.5 3.3* 3.1*  CL 92* 95* 92* 95* 98* 97*  CO2 24 24 24 24 28 28   GLUCOSE 119* 102* 102* 105* 110* 106*  BUN 58* 55* 56* 56* 60* 50*  CREATININE 1.33* 1.30* 1.38* 1.41* 1.36* 1.18*  CALCIUM 9.1 8.8* 8.5* 8.5* 8.7* 8.5*  MG 1.5* 1.9  --   --   --   --   PHOS  --  3.5  --   --   --   --    GFR: Estimated Creatinine Clearance: 43.3 mL/min (A) (by C-G formula based on SCr of 1.18 mg/dL (H)). Liver Function Tests:  Recent Labs Lab 10/17/17 0440  AST 21  ALT 10*  ALKPHOS 107  BILITOT 1.6*  PROT 6.7  ALBUMIN 3.3*   No results for input(s): LIPASE, AMYLASE in the last 168 hours. No results for input(s): AMMONIA in the last 168 hours. Coagulation Profile:  Recent Labs Lab 10/19/17 0346 10/20/17 0425 10/21/17 0417 10/22/17 0334 10/23/17 0424  INR 2.77 2.84 2.83 2.70 2.80   Cardiac Enzymes: No results for input(s): CKTOTAL, CKMB, CKMBINDEX, TROPONINI in the  last 168 hours. BNP (last 3 results) No results for input(s): PROBNP in the last 8760 hours. HbA1C: No results for input(s): HGBA1C in the last 72 hours. CBG:  Recent Labs Lab 10/20/17 2151 10/21/17 0754 10/21/17 1203 10/21/17 1650 10/21/17 2139  GLUCAP 121* 104* 136* 106* 158*   Lipid Profile: No results for input(s): CHOL, HDL, LDLCALC, TRIG, CHOLHDL, LDLDIRECT in the last 72 hours. Thyroid Function Tests: No results for input(s): TSH, T4TOTAL, FREET4, T3FREE, THYROIDAB in the last 72 hours. Anemia Panel: No results for input(s): VITAMINB12, FOLATE, FERRITIN, TIBC, IRON, RETICCTPCT in the last 72 hours. Sepsis Labs:  Recent Labs Lab 10/16/17 2120  LATICACIDVEN 1.9    Recent Results (from the past 240 hour(s))  MRSA PCR Screening     Status: None   Collection Time: 10/16/17 11:47 PM  Result Value Ref Range Status   MRSA by PCR NEGATIVE NEGATIVE Final    Comment:        The GeneXpert MRSA Assay (FDA approved for NASAL specimens only), is one component of a comprehensive MRSA colonization surveillance program. It is not intended to diagnose MRSA infection nor to guide or monitor treatment for MRSA infections.          Radiology Studies: [place] Rt Lower Extrem Ltd Soft Tissue Non Vascular  Result Date: 10/23/2017 CLINICAL DATA:  Soft tissue prominence medial right thigh EXAM: ULTRASOUND RIGHT MEDIAL THIGH LIMITED TECHNIQUE: Ultrasound examination of the lower extremity soft tissues was performed in the area of clinical concern in longitudinal and transverse projection. COMPARISON:  None. FINDINGS: There is subcutaneous thickening consistent with cellulitis. No fluid collection or abscess evident. No evident mass or adenopathy. IMPRESSION: Evidence of a degree of cellulitis. No frank abscess or mass evident by ultrasound in the medial right thigh region. Electronically Signed   By: Lowella Grip III M.D.   On: 10/23/2017 12:17        Scheduled Meds: .  colchicine  0.3 mg Oral Daily  . febuxostat  40 mg Oral Daily  . feeding supplement (GLUCERNA SHAKE)  237 mL Oral TID BM  . furosemide  40 mg Oral BID  . pantoprazole  40 mg Oral Daily  . potassium chloride  40 mEq Oral BID  . sodium chloride flush  3 mL Intravenous Q12H  . warfarin  2.5 mg Oral ONCE-1800  . Warfarin - Pharmacist Dosing Inpatient   Does not apply q1800   Continuous Infusions: . sodium chloride 1,000 mL (10/19/17 0533)  . cefTRIAXone (ROCEPHIN)  IV Stopped (10/22/17 2212)  . vancomycin       LOS: 7 days     Reyne Dumas, MD [facility] If 7PM-7AM, please contact night-coverage [email or web address] Password [facility] 10/23/2017, 12:27 PM

## 2017-10-23 NOTE — Progress Notes (Signed)
Physical Therapy Treatment Patient Details Name: PENELOPI MIKRUT MRN: [id number] DOB: 10-24-1933 Today's Date: 10/23/2017    History of Present Illness B LE cellulitis    PT Comments    Assisted pt with bed mobility and transfer to standing with walker. Pt ambulated in hallway with min guard.   Follow Up Recommendations  SNF  [facility]     Equipment Recommendations  None recommended by PT    Recommendations for Other Services       Precautions / Restrictions Precautions Precautions: Fall Restrictions Weight Bearing Restrictions: No    Mobility  Bed Mobility Overal bed mobility: Needs Assistance Bed Mobility: Supine to Sit;Sit to Supine     Supine to sit: Min guard Sit to supine: Min guard   General bed mobility comments: Min guard with B LE with bed mobility  Transfers Overall transfer level: Needs assistance Equipment used: Standard walker Transfers: Sit to/from Stand Sit to Stand: Min guard Stand pivot transfers: Min guard       General transfer comment: VC needed for reaching to walker   Ambulation/Gait Ambulation/Gait assistance: Min guard Ambulation Distance (Feet): 60 Feet Assistive device: Rolling walker (2 wheeled) Gait Pattern/deviations: Step-through pattern;Step-to pattern;Trunk flexed     General Gait Details: VCs needed for flexed posture and safety on turns and obstacles   Stairs            Wheelchair Mobility    Modified Rankin (Stroke Patients Only)       Balance                                            Cognition Arousal/Alertness: Awake/alert Behavior During Therapy: WFL for tasks assessed/performed Overall Cognitive Status: Within Functional Limits for tasks assessed                                        Exercises      General Comments        Pertinent Vitals/Pain Pain Assessment: 0-10 Pain Score: 8  Faces Pain Scale: Hurts even more Pain Location: R upper thigh   Pain Descriptors / Indicators: Throbbing;Burning Pain Intervention(s): Repositioned    Home Living                      Prior Function            PT Goals (current goals can now be found in the care plan section)      Frequency    Min 3X/week      PT Plan Current plan remains appropriate    Co-evaluation              AM-PAC PT "6 Clicks" Daily Activity  Outcome Measure  Difficulty turning over in bed (including adjusting bedclothes, sheets and blankets)?: A Lot Difficulty moving from lying on back to sitting on the side of the bed? : A Lot Difficulty sitting down on and standing up from a chair with arms (e.g., wheelchair, bedside commode, etc,.)?: A Little Help needed moving to and from a bed to chair (including a wheelchair)?: A Lot Help needed walking in hospital room?: A Little Help needed climbing 3-5 steps with a railing? : A Lot 6 Click Score: 14    End of Session Equipment Utilized During  Treatment: Gait belt Activity Tolerance: Patient tolerated treatment well Patient left: in bed;with call bell/phone within reach;with bed alarm set         Time: 1415-1435 PT Time Calculation (min) (ACUTE ONLY): 20 min  Charges:  $Gait Training: 8-22 mins                    G Codes:         Almond Lint SPTA 10/23/2017, 3:04 PM   Reviewed and agree with above  Rica Koyanagi  PTA [facility]  Acute  Rehab Pager      [phone]

## 2017-10-23 NOTE — NC FL2 (Signed)
[place] LEVEL OF CARE SCREENING TOOL     IDENTIFICATION  Patient Name: [name] Birthdate: Mar 20, 1933 Sex: female Admission Date (Current Location): 10/16/2017  [facility] and [place] Number:  Herbalist and Address:  [facility],  [place] [address] [place]      Provider Number: [id number]  Attending Physician Name and Address:  Reyne Dumas, MD  Relative Name and Phone Number:       Current Level of Care: Hospital Recommended Level of Care: [place] Prior Approval Number:    Date Approved/Denied:   PASRR Number: [id number]  Discharge Plan: SNF    Current Diagnoses: Patient Active Problem List   Diagnosis Date Noted  . Cellulitis of right leg 10/16/2017  . Hyponatremia 10/16/2017  . Hypomagnesemia 10/16/2017  . Paroxysmal A-fib ([place]) 10/16/2017  . Cellulitis 10/16/2017  . Pulmonary HTN ([place])   . Acute drug-induced gout of right hand   . Fecal incontinence 07/09/2017  . Anemia, iron deficiency 05/15/2016  . Allergic rhinitis 11/19/2015  . Non-proliferative diabetic retinopathy, mild, left eye ([place]) 10/10/2015  . Depression 03/25/2015  . GERD (gastroesophageal reflux disease) 03/25/2015  . Spinal stenosis   . Atrial fibrillation ([place]) 01/08/2014  . Long term current use of anticoagulant therapy 01/08/2014  . Diastolic CHF, chronic ([place]) 01/02/2014  . Type II diabetes mellitus with ophthalmic manifestations ([place]) 01/02/2014  . Coronary artery disease   . Lichen sclerosus   . Carotid artery stenosis 04/01/2012  . HTN (hypertension) 08/29/2011  . Hyperlipidemia 07/18/2011    Orientation RESPIRATION BLADDER Height & Weight     Self, Time, Situation, Place  Normal Continent Weight: 200 lb (90.7 kg) Height:     BEHAVIORAL SYMPTOMS/MOOD NEUROLOGICAL BOWEL NUTRITION STATUS      Continent Diet (see DC summary)  AMBULATORY STATUS COMMUNICATION OF NEEDS Skin   Extensive Assist Verbally   Reason for Consult: full and partial thickness areas of dermatitis, cellulitis (L>R) Wound type:infectious Pressure Injury POA: N/A Measurement:  RLE: 7cm x 5cm x 0.1cm with scattered islands of epithelial tissue LLE: distal, 2cm x 2.5cm x 0.1cm with pink moist wound bed LLE lateral, 0.4cm x 0.6cm x 0.1cm red, reepithelializing Wound IHW:TUUE to red, moist Drainage (amount, consistency, odor) serous to light yellow exudate on left, none on right Periwound: erythematous and edematous on left. Resolving erythema and edema on right. Dressing procedure/placement/frequency: Patient has been treated by her [facility] successfully using a calcium alginate dressing and I will continue that here while in house.  The missing element for the LLE (cellulitis) was systemic antibiotics and I suspect she will respond now with those in place. I have today provided bilateral Prevalon boots for use while in bed to correct alignment and provide elevation and protection, as well as a pressure redistribution chair pad for her use while in a chair.                     Personal Care Assistance Level of Assistance  Bathing, Feeding, Dressing Bathing Assistance: Limited assistance Feeding assistance: Independent Dressing Assistance: Limited assistance     Functional Limitations Info  Sight, Speech, Hearing Sight Info: Adequate Hearing Info: Adequate Speech Info: Adequate    SPECIAL CARE FACTORS FREQUENCY  PT (By licensed PT), OT (By licensed OT)     PT Frequency: 5x/week OT Frequency: 5x/week            Contractures Contractures Info: Not present    Additional Factors  Info  Code Status, Allergies Code Status Info: Full Allergies Info: Ace Inhibitors, Amlodipine, Statins, Sulfa Drugs Cross Reactors, Zetia Ezetimibe, Fenofibrate           Current Medications (10/23/2017):  This is the current hospital active medication list Current Facility-Administered Medications  Medication Dose Route  Frequency Provider Last Rate Last Dose  . 0.9 %  sodium chloride infusion  250 mL Intravenous PRN Toy Baker, MD 20 mL/hr at 10/19/17 0533 1,000 mL at 10/19/17 0533  . acetaminophen (TYLENOL) tablet 650 mg  650 mg Oral Q6H PRN Toy Baker, MD       Or  . acetaminophen (TYLENOL) suppository 650 mg  650 mg Rectal Q6H PRN Doutova, Anastassia, MD      . cefTRIAXone (ROCEPHIN) 1 g in dextrose 5 % 50 mL IVPB  1 g Intravenous Q24H Toy Baker, MD   Stopped at 10/22/17 2212  . colchicine tablet 0.3 mg  0.3 mg Oral Daily Doutova, Anastassia, MD   0.3 mg at 10/23/17 0940  . febuxostat (ULORIC) tablet 40 mg  40 mg Oral Daily Doutova, Anastassia, MD   40 mg at 10/23/17 0941  . feeding supplement (GLUCERNA SHAKE) (GLUCERNA SHAKE) liquid 237 mL  237 mL Oral TID BM Georgette Shell, MD   237 mL at 10/23/17 0946  . furosemide (LASIX) injection 40 mg  40 mg Intravenous Q12H Reyne Dumas, MD      . HYDROcodone-acetaminophen (NORCO/VICODIN) 5-325 MG per tablet 1-2 tablet  1-2 tablet Oral Q4H PRN Toy Baker, MD   2 tablet at 10/23/17 1336  . loratadine (CLARITIN) tablet 10 mg  10 mg Oral Daily PRN Toy Baker, MD   10 mg at 10/21/17 1137  . ondansetron (ZOFRAN) tablet 4 mg  4 mg Oral Q6H PRN Doutova, Anastassia, MD       Or  . ondansetron (ZOFRAN) injection 4 mg  4 mg Intravenous Q6H PRN Doutova, Anastassia, MD      . pantoprazole (PROTONIX) EC tablet 40 mg  40 mg Oral Daily Doutova, Anastassia, MD   40 mg at 10/23/17 0941  . potassium chloride (K-DUR) CR tablet 40 mEq  40 mEq Oral BID Abrol, Ascencion Dike, MD      . sodium chloride flush (NS) 0.9 % injection 3 mL  3 mL Intravenous Q12H Doutova, Anastassia, MD   3 mL at 10/23/17 0939  . sodium chloride flush (NS) 0.9 % injection 3 mL  3 mL Intravenous PRN Toy Baker, MD   3 mL at 10/22/17 1632  . vancomycin (VANCOCIN) IVPB 1000 mg/200 mL premix  1,000 mg Intravenous Q24H Shade, Christine E, RPH      . warfarin  (COUMADIN) tablet 2.5 mg  2.5 mg Oral ONCE-1800 Angela Adam, [facility]      . Warfarin - Pharmacist Dosing Inpatient   Does not apply q1800 Minda Ditto, RPH   2.5 each at 10/21/17 1754     Discharge Medications: Please see discharge summary for a list of discharge medications.  Relevant Imaging Results:  Relevant Lab Results:   Additional Information [id number]  Servando Snare, LCSW

## 2017-10-23 NOTE — Clinical Social Work Placement (Signed)
   CLINICAL SOCIAL WORK PLACEMENT  NOTE  Date:  10/23/2017  Patient Details  Name: [name] MRN: [id number] Date of Birth: 14-Sep-1933  Clinical Social Work is seeking post-discharge placement for this patient at the [place] level of care (*CSW will initial, date and re-position this form in  chart as items are completed):  Yes   Patient/family provided with [place] Work Department's list of facilities offering this level of care within the geographic area requested by the patient (or if unable, by the patient's family).  Yes   Patient/family informed of their freedom to choose among providers that offer the needed level of care, that participate in Medicare, Medicaid or managed care program needed by the patient, have an available bed and are willing to accept the patient.  Yes   Patient/family informed of [facility]'s ownership interest in [facility] and [facility], as well as of the fact that they are under no obligation to receive care at these facilities.  PASRR submitted to EDS on       PASRR number received on       Existing PASRR number confirmed on 10/23/17     FL2 transmitted to all facilities in geographic area requested by pt/family on 10/23/17     FL2 transmitted to all facilities within larger geographic area on       Patient informed that his/her managed care company has contracts with or will negotiate with certain facilities, including the following:        Yes   Patient/family informed of bed offers received.  Patient chooses bed at [facility]     Physician recommends and patient chooses bed at [facility]    Patient to be transferred to [facility] on  .  Patient to be transferred to facility by       Patient family notified on   of transfer.  Name of family member notified:        PHYSICIAN Please sign FL2     Additional Comment:     _______________________________________________ Lilly C[place], LCSW 10/23/2017, 4:07 PM

## 2017-10-23 NOTE — Progress Notes (Signed)
Pt now expressing desire to plan to go to SNF section of [place] at DC due to her deconditioning. Daughter expressing this as well.  Pt is from independent living apartment.  Discussed with [facility] SNF admissions who reviewed pt's information and advises SNF (healthcare building) seems appropriate for pt, however notes therapy evaluations recommending home health.  Will inquire with PT if therapy sessions indicate whether SNF would be appropriate level of rehab for pt at DC.  Will complete FL2 and Passr for facility.  Sharren Bridge, MSW, LCSW Clinical Social Work 10/23/2017 [phone]

## 2017-10-23 NOTE — Progress Notes (Signed)
Occupational Therapy Treatment and goal update Patient Details Name: [name] MRN: [id number] DOB: December 06, 1933 Today's Date: 10/23/2017    History of present illness 83 yo female with onset of pulm edema and effusion with admission for Leg  edema.  Has been treated for LE wounds   PMHx:  DM, CHF, CAD with stent, HTN, HLD, a-fib, OA, spinal stenosis   OT comments  Pt is making progress;however, treatment was limited due to pain.  Pt needs min A for getting legs back up on bed.  She was unable to tolerate walking to bathroom or performing further ADLs. She cannot return to independent living at this level.  Goals updated today  Follow Up Recommendations  SNF    Equipment Recommendations  3 in 1 bedside commode    Recommendations for Other Services      Precautions / Restrictions Precautions Precautions: Fall Restrictions Weight Bearing Restrictions: No       Mobility Bed Mobility         Supine to sit: Min guard Sit to supine: Min assist   General bed mobility comments: assist for both legs back into bed  Transfers         Stand pivot transfers: Min guard       General transfer comment: close guard for safety with SPT:  Pt held onto armest of BSC    Balance                                           ADL either performed or assessed with clinical judgement   ADL       Grooming: Set up;Sitting                   Toilet Transfer: Min guard;Stand-pivot;BSC;RW   Toileting- Clothing Manipulation and Hygiene: Min guard;Sit to/from stand         General ADL Comments: pt did not want to walk to bathroom due to leg pain.       Vision       Perception     Praxis      Cognition Arousal/Alertness: Awake/alert Behavior During Therapy: WFL for tasks assessed/performed Overall Cognitive Status: Within Functional Limits for tasks assessed                                          Exercises     Shoulder  Instructions       General Comments      Pertinent Vitals/ Pain       Pain Assessment: Faces Faces Pain Scale: Hurts even more Pain Location: both lower legs Pain Descriptors / Indicators: Discomfort Pain Intervention(s): Limited activity within patient's tolerance;Monitored during session;Repositioned;Patient requesting pain meds-RN notified;RN gave pain meds during session  Home Living                                          Prior Functioning/Environment              Frequency  Min 2X/week        Progress Toward Goals  OT Goals(current goals can now be found in the care plan section)  Progress towards OT goals: Progressing toward  goals  Acute Rehab OT Goals Time For Goal Achievement: 10/30/17 Potential to Achieve Goals: Good ADL Goals Pt Will Perform Grooming: with supervision;standing Pt Will Perform Lower Body Bathing: with supervision;with adaptive equipment;sit to/from stand Pt Will Perform Lower Body Dressing: with min assist;sit to/from stand;with adaptive equipment Pt Will Transfer to Toilet: with min guard assist;ambulating;bedside commode Pt Will Perform Toileting - Clothing Manipulation and hygiene: with supervision;sit to/from stand  Plan      Co-evaluation                 AM-PAC PT "6 Clicks" Daily Activity     Outcome Measure   Help from another person eating meals?: None Help from another person taking care of personal grooming?: A Little Help from another person toileting, which includes using toliet, bedpan, or urinal?: A Little Help from another person bathing (including washing, rinsing, drying)?: A Lot Help from another person to put on and taking off regular upper body clothing?: None Help from another person to put on and taking off regular lower body clothing?: A Lot 6 Click Score: 18    End of Session    OT Visit Diagnosis: Unsteadiness on feet (R26.81)   Activity Tolerance Patient tolerated treatment  well   Patient Left in bed;with call bell/phone within reach;with bed alarm set   Nurse Communication          Time: 9476-5465 OT Time Calculation (min): 14 min  Charges: OT General Charges $OT Visit: 1 Visit OT Treatments $Self Care/Home Management : 8-22 mins  Lesle Chris, OTR/L [phone] 10/23/2017   Resa Rinks 10/23/2017, 1:48 PM

## 2017-10-23 NOTE — Progress Notes (Signed)
[facility] for warfarin Indication: atrial fibrillation  Allergies  Allergen Reactions  . Ace Inhibitors Other (See Comments)    Intolerance per Dr. Doug Sou note  . Amlodipine Other (See Comments)    Intolerance per Dr. Doug Sou note   . Statins Other (See Comments)    Muscle soreness  . Sulfa Drugs Cross Reactors Itching  . Zetia [Ezetimibe] Other (See Comments)    Muscle soreness  . Fenofibrate Other (See Comments)    Muscle soreness   Vital Signs: Temp: 97.9 F (36.6 C) (10/24 0346) Temp Source: Oral (10/24 0346) BP: 102/69 (10/24 0346) Pulse Rate: 71 (10/24 0346)  Labs:  Recent Labs  10/21/17 0417 10/22/17 0334 10/23/17 0424  HGB 10.9*  --  10.7*  HCT 34.6*  --  33.8*  PLT 292  --  307  LABPROT 29.6* 28.5* 29.3*  INR 2.83 2.70 2.80  CREATININE 1.36*  --  1.18*   Estimated Creatinine Clearance: 43.3 mL/min (A) (by C-G formula based on SCr of 1.18 mg/dL (H)).  Medications:  Scheduled:  . colchicine  0.3 mg Oral Daily  . febuxostat  40 mg Oral Daily  . feeding supplement (GLUCERNA SHAKE)  237 mL Oral TID BM  . furosemide  40 mg Oral BID  . pantoprazole  40 mg Oral Daily  . potassium chloride  20 mEq Oral Daily  . sodium chloride flush  3 mL Intravenous Q12H  . Warfarin - Pharmacist Dosing Inpatient   Does not apply q1800   Infusions:  . sodium chloride 1,000 mL (10/19/17 0533)  . cefTRIAXone (ROCEPHIN)  IV Stopped (10/22/17 2212)  . vancomycin     Assessment: 36 yoF admitted on 10/17 with c/o right thigh and leg redness and pain on chronic warfarin for A-fib.  Home Warfarin: 5 mg on Sun/Tu/Thu/Sat, and 2.5 mg on M/W/F.  INR=3.38 on admission.  Today, 10/23/2017  INR therapeutic, 2.8  Diet: Eating 25-100% of meal tray  CBC: Hg slightly low, but stable   No bleeding reported  DDI: Broad spectrum antibiotics (Ceftriaxone and vanc) may increase INR  Goal of Therapy:  INR 2-3   Plan:   Warfarin 2.5mg  PO x1  dose at 1800 per home regimen.  Daily PT/INR  Monitor CBC, s/sx bleed   Dolly Rias RPh 10/23/2017, 8:43 AM Pager [phone]

## 2017-10-24 LAB — BASIC METABOLIC PANEL
ANION GAP: 12 (ref 5–15)
BUN: 47 mg/dL — ABNORMAL HIGH (ref 6–20)
CALCIUM: 9 mg/dL (ref 8.9–10.3)
CHLORIDE: 94 mmol/L — AB (ref 101–111)
CO2: 30 mmol/L (ref 22–32)
CREATININE: 1.12 mg/dL — AB (ref 0.44–1.00)
GFR calc Af Amer: 52 mL/min — ABNORMAL LOW (ref >60)
GFR calc non Af Amer: 44 mL/min — ABNORMAL LOW (ref >60)
GLUCOSE: 104 mg/dL — AB (ref 65–99)
Potassium: 3.4 mmol/L — ABNORMAL LOW (ref 3.5–5.1)
Sodium: 136 mmol/L (ref 135–145)

## 2017-10-24 LAB — MAGNESIUM: Magnesium: 1.3 mg/dL — ABNORMAL LOW (ref 1.7–2.4)

## 2017-10-24 LAB — PROTIME-INR
INR: 3.22
Prothrombin Time: 32.7 seconds — ABNORMAL HIGH (ref 11.4–15.2)

## 2017-10-24 MED ORDER — POTASSIUM CHLORIDE ER 10 MEQ PO TBCR
40.0000 meq | EXTENDED_RELEASE_TABLET | Freq: Two times a day (BID) | ORAL | Status: AC
  Administered 2017-10-24 (×2): 40 meq via ORAL
  Filled 2017-10-24 (×4): qty 4

## 2017-10-24 MED ORDER — BISACODYL 10 MG RE SUPP
10.0000 mg | Freq: Once | RECTAL | Status: AC
  Administered 2017-10-24: 10 mg via RECTAL
  Filled 2017-10-24: qty 1

## 2017-10-24 MED ORDER — ADULT MULTIVITAMIN W/MINERALS CH
1.0000 | ORAL_TABLET | Freq: Every day | ORAL | Status: DC
  Administered 2017-10-24 – 2017-10-28 (×5): 1 via ORAL
  Filled 2017-10-24 (×5): qty 1

## 2017-10-24 MED ORDER — MAGNESIUM SULFATE 2 GM/50ML IV SOLN
2.0000 g | Freq: Once | INTRAVENOUS | Status: AC
  Administered 2017-10-24: 2 g via INTRAVENOUS
  Filled 2017-10-24: qty 50

## 2017-10-24 NOTE — Progress Notes (Signed)
Nutrition Follow-up  DOCUMENTATION CODES:   Obesity unspecified  INTERVENTION:   -Continue Glucerna Shake po TID, each supplement provides 220 kcal and 10 grams of protein -Provide Multivitamin with minerals daily -RD will continue to monitor for needs  NUTRITION DIAGNOSIS:   Increased nutrient needs related to wound healing as evidenced by estimated needs.  Ongoing.  GOAL:   Patient will meet greater than or equal to 90% of their needs  Progressing.  MONITOR:   PO intake, Supplement acceptance, Weight trends, Labs, Skin, I & O's  ASSESSMENT:   83 y.o. female with medical history significant of CAD, diastolic CHF, pulmonary HTN, NSTEMI, HTN and atrial fibrillation on coumadin  Patient continues to eat variably. This morning pt refused any breakfast.   On 10/24, PO intakes were as follows: B: 100% ~360 kcal, 4g protein L: 100% ~250 kcal, 4g protein D: 100% ~375 kcal, 14g protein  Pt did not like the renal diet she was ordered, now on heart healthy. Pt not drinking supplements consistently. Pt not meeting needs, will order MVI.  Medications: IV Lasix BID, Protonix tablet daily, K-DUR tablet BID Labs reviewed: Low K & Mg GFR: 44  Diet Order:  Diet Heart Room service appropriate? Yes; Fluid consistency: Thin  Skin:  Wound (see comment) (chronic leg wounds -DM )  Last BM:  10/23  Height:   Ht Readings from Last 1 Encounters:  10/16/17 5\' 8"  (1.727 m)    Weight:   Wt Readings from Last 1 Encounters:  10/23/17 200 lb (90.7 kg)    Ideal Body Weight:  63.6 kg  BMI:  Body mass index is 30.41 kg/m.  Estimated Nutritional Needs:   Kcal:  1700-1900  Protein:  70-80g  Fluid:  1.7L/day  EDUCATION NEEDS:   No education needs identified at this time  Clayton Bibles, MS, RD, [facility] Pager: [phone] After Hours Pager: [phone]

## 2017-10-24 NOTE — Progress Notes (Signed)
[facility] for warfarin Indication: atrial fibrillation  Allergies  Allergen Reactions  . Ace Inhibitors Other (See Comments)    Intolerance per Dr. Doug Sou note  . Amlodipine Other (See Comments)    Intolerance per Dr. Doug Sou note   . Statins Other (See Comments)    Muscle soreness  . Sulfa Drugs Cross Reactors Itching  . Zetia [Ezetimibe] Other (See Comments)    Muscle soreness  . Fenofibrate Other (See Comments)    Muscle soreness   Vital Signs: Temp: 98.7 F (37.1 C) (10/25 0526) Temp Source: Oral (10/25 0526) BP: 102/76 (10/25 0526) Pulse Rate: 74 (10/25 0526)  Labs:  Recent Labs  10/22/17 0334 10/23/17 0424 10/24/17 0412 10/24/17 0801  HGB  --  10.7*  --   --   HCT  --  33.8*  --   --   PLT  --  307  --   --   LABPROT 28.5* 29.3* 32.7*  --   INR 2.70 2.80 3.22  --   CREATININE  --  1.18*  --  1.12*   Estimated Creatinine Clearance: 45.6 mL/min (A) (by C-G formula based on SCr of 1.12 mg/dL (H)).  Medications:  Scheduled:  . colchicine  0.3 mg Oral Daily  . febuxostat  40 mg Oral Daily  . feeding supplement (GLUCERNA SHAKE)  237 mL Oral TID BM  . furosemide  40 mg Intravenous Q12H  . pantoprazole  40 mg Oral Daily  . potassium chloride  40 mEq Oral BID  . sodium chloride flush  3 mL Intravenous Q12H  . Warfarin - Pharmacist Dosing Inpatient   Does not apply q1800   Infusions:  . sodium chloride 1,000 mL (10/19/17 0533)  . cefTRIAXone (ROCEPHIN)  IV Stopped (10/23/17 2238)  . vancomycin Stopped (10/23/17 1806)   Assessment: 73 yoF admitted on 10/17 with c/o right thigh and leg redness and pain on chronic warfarin for A-fib.  Home Warfarin: 5 mg on Sun/Tu/Thu/Sat, and 2.5 mg on M/W/F.  INR=3.38 on admission.  Today, 10/24/2017  INR 3.22, above therapeutic range  Diet: Eating q00% of meal trays  CBC: Hg slightly low, but stable   No bleeding reported  DDI: Broad spectrum antibiotics (Ceftriaxone and  vanc) may increase INR  Goal of Therapy:  INR 2-3   Plan:   Hold Warfarin today for elevated INR  Daily PT/INR  Monitor CBC, s/sx bleed   Minda Ditto PharmD Pager [phone] 10/24/2017, 10:06 AM

## 2017-10-24 NOTE — Progress Notes (Addendum)
PROGRESS NOTE  [name]  XLK:[id number] DOB: [birth year] DOA: 10/16/2017 PCP: [name], MD  Brief Narrative: [name] is a 83 y.o. female with a history of CAD s/p CABG, chronic HFpEF, T2DM, AFib on coumadin who presented for right thigh pain and redness for several days and was admitted with cellulitis. Hospital course has been prolonged due to slow recovery.   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 No evidence of abscess or complication on exam or imaging. Not septic. No DVT on U/S.  - Continue antibiotics (vancomycin/ceftriaxone empirically) - Continue diuretics  Bilateral lower extremity edema: Suspect combination venous stasis and acute on chronic diastolic CHF.  - Continue IV lasix - WOC consulted - Consider full unna's boots.   Sacrum fracture: Incidental finding on CT right femur 10/24. Pt reports having fallen on her bottom last month with some pain but this has improved and is not overly bothersome any more.  - Supportive care.   CKD stage III: Stable.  - Monitor with diuresis.  Well-controlled T2DM: HbA1c 5.5%, unlikely to be contributing to poor healing.  - Holding metformin  Chronic atrial fibrillation: Rate controlled - Continue coumadin per pharmacy.   CAD s/p CABG and DES: No chest pain.  - Continue home medications  Gout: No flare.  - Continue uloric, colchicine  Hypokalemia: Due to diuresis.  - Replace and recheck  DVT prophylaxis: Coumadin Code Status: Full Family Communication: Called daughter twice, left voicemail. Just heard back from her  and discussed in detail. Disposition Plan: DC to SNF once improving  Consultants:   None  Procedures:   None  Antimicrobials:  Vancomycin, ceftriaxone   Subjective: She is smiling as she says "well, I'm not getting a lot better." Her pain is controlled and the redness in the interior right thigh is slightly less painful, but still very red and bothersome. No fevers. Denies lower back pain.   Objective: Vitals:   10/23/17 0439 10/23/17 1406 10/23/17 2036 10/24/17 0526  BP:  93/63 98/64 102/76  Pulse:  82 88 74  Resp:  20 19 18   Temp:  97.8 F (36.6 C) 98.4 F (36.9 C) 98.7 F (37.1 C)  TempSrc:  Oral Oral Oral  SpO2:  100% 99% 100%  Weight: 90.7 kg (200 lb)       Intake/Output Summary (Last 24 hours) at 10/24/17 1513 Last data filed at 10/24/17 1103  Gross per 24 hour  Intake             2844 ml  Output             2000 ml  Net              844 ml   Filed Weights   10/21/17 0605 10/22/17 0700 10/23/17 0439  Weight: 91.3 kg (201 lb 4.5 oz) 89.5 kg (197 lb 5 oz) 90.7 kg (200 lb)    Gen: Elderly female in no distress Pulm: Non-labored breathing room air. Clear to auscultation bilaterally.  CV: Irreg. No murmur, rub, or gallop. No JVD GI: Abdomen soft, non-tender, non-distended, with normoactive  bowel sounds. No organomegaly or masses felt. Ext: Medial right thigh with ~20cm indurated, tender erythema without fluctuance. Bilateral distal LE's with 2+ pitting edema and venous stasis changes.  Skin: As above. Neuro: Alert and oriented. No focal neurological deficits. Psych: Judgement and insight appear normal. Mood & affect appropriate.   Data Reviewed: I have personally reviewed following labs and imaging studies  CBC:  Recent Labs Lab 10/18/17 0409 10/19/17 0346 10/21/17 0417 10/23/17 0424  WBC 7.0 7.2 7.7 9.0  HGB 10.8* 10.8* 10.9* 10.7*  HCT 34.4* 33.9* 34.6* 33.8*  MCV 82.1 81.3 81.6 81.8  PLT 259 280 292 643   Basic Metabolic Panel:  Recent  Labs Lab 10/18/17 0409 10/19/17 0346 10/21/17 0417 10/23/17 0424 10/24/17 0801  NA 130* 132* 136 137 136  K 3.7 3.5 3.3* 3.1* 3.4*  CL 92* 95* 98* 97* 94*  CO2 24 24 28 28 30   GLUCOSE 102* 105* 110* 106* 104*  BUN 56* 56* 60* 50* 47*  CREATININE 1.38* 1.41* 1.36* 1.18* 1.12*  CALCIUM 8.5* 8.5* 8.7* 8.5* 9.0  MG  --   --   --  1.3* 1.3*   GFR: Estimated Creatinine Clearance: 45.6 mL/min (A) (by C-G formula based on SCr of 1.12 mg/dL (H)). Liver Function Tests: No results for input(s): AST, ALT, ALKPHOS, BILITOT, PROT, ALBUMIN in the last 168 hours. No results for input(s): LIPASE, AMYLASE in the last 168 hours. No results for input(s): AMMONIA in the last 168 hours. Coagulation Profile:  Recent Labs Lab 10/20/17 0425 10/21/17 0417 10/22/17 0334 10/23/17 0424 10/24/17 0412  INR 2.84 2.83 2.70 2.80 3.22   Cardiac Enzymes: No results for input(s): CKTOTAL, CKMB, CKMBINDEX, TROPONINI in the last 168 hours. BNP (last 3 results) No results for input(s): PROBNP in the last 8760 hours. HbA1C: No results for input(s): HGBA1C in the last 72 hours. CBG:  Recent Labs Lab 10/20/17 2151 10/21/17 0754 10/21/17 1203 10/21/17 1650 10/21/17 2139  GLUCAP 121* 104* 136* 106* 158*   Lipid Profile: No results for input(s): CHOL, HDL, LDLCALC, TRIG, CHOLHDL, LDLDIRECT in the last 72 hours. Thyroid Function Tests: No results for input(s): TSH, T4TOTAL, FREET4, T3FREE, THYROIDAB in the last 72 hours. Anemia Panel: No results for input(s): VITAMINB12, FOLATE, FERRITIN, TIBC, IRON, RETICCTPCT in the last 72 hours. Urine analysis:    Component Value Date/Time   COLORURINE YELLOW 12/03/2016 1146   APPEARANCEUR CLEAR 12/03/2016 1146   LABSPEC 1.015 12/03/2016 1146   PHURINE 5.0 12/03/2016 1146   GLUCOSEU NEGATIVE 12/03/2016 1146   HGBUR NEGATIVE 12/03/2016 1146   BILIRUBINUR SMALL (A) 12/03/2016 1146   KETONESUR NEGATIVE 12/03/2016 1146   PROTEINUR 30 (A) 12/03/2016 1146    UROBILINOGEN 0.2 02/03/2013 1217   NITRITE NEGATIVE 12/03/2016 1146   LEUKOCYTESUR SMALL (A) 12/03/2016 1146   Recent Results (from the past 240 hour(s))  MRSA PCR Screening     Status: None   Collection Time: 10/16/17 11:47 PM  Result Value Ref Range Status   MRSA by PCR NEGATIVE NEGATIVE Final    Comment:        The GeneXpert MRSA Assay (FDA approved for NASAL specimens only), is one component of a comprehensive MRSA colonization surveillance program. It is not intended to diagnose MRSA infection nor to guide or monitor treatment for MRSA infections.       Radiology Studies: Ct Femur Right Wo Contrast  Result Date: 10/23/2017 CLINICAL DATA:  Right thigh erythema and pain. Chronic bilateral leg ulcers. EXAM: CT OF THE  LOWER RIGHT EXTREMITY WITHOUT CONTRAST TECHNIQUE: Multidetector CT imaging of the right lower extremity was performed according to the standard protocol. COMPARISON:  Right hip MRI from 05/15/2005 FINDINGS: Bones/Joint/Cartilage The top-most images demonstrate an acute pelvic fracture involving the lower sacrum. No bony destructive findings characteristic of osteomyelitis. Prominent osteoarthritis of the knee the food with probable heterotopic ossification or chronic calcified fat necrosis anterolateral to the distal femur. Small ossification lateral to the lateral meniscus on image 361/3. Severe medial compartmental articular space narrowing. Mild degenerative arthropathy of the hip on the right. Mild spurring of the pubic symphysis. Ligaments Suboptimally assessed by CT. Muscles and Tendons There is some low-level edema tracking along deep fascia planes in the thigh in a diffuse fashion, but less striking than the subcutaneous edema. Soft tissues Diffuse subcutaneous edema along the lower abdomen, right buttock and hip region, and tracking in the right thigh down into the visualized upper calf. Include a small part of the left thigh and it also shows diffuse subcutaneous  edema in the visualized portion. Extensive pelvic ascites. Right inguinal lymph nodes are present measuring up to about 1.2 cm in diameter. Venous varicosities present posteromedially. Atherosclerotic calcification is noted. I do not observe a drainable abscess. IMPRESSION: 1. The patient appears to have an acute fracture the lower sacrum. This is only partially included on the very top most axial images. Please note that much of the bony pelvis was not included on today's CT exam of the femur. 2. Extensive third spacing of fluid, with diffuse subcutaneous edema, extensive pelvic ascites, and low-level edema tracking along the deep fascia planes of the thigh, cause uncertain. No hip or knee effusion to suggest septic joint. No gas in the soft tissues or drainable abscess identified. 3. Severe osteoarthritis of the right knee. 4. Venous varicosities posteromedially in the right thigh. Electronically Signed   By: Van Clines M.D.   On: 10/23/2017 16:25   [place] Rt Lower Extrem Ltd Soft Tissue Non Vascular  Result Date: 10/23/2017 CLINICAL DATA:  Soft tissue prominence medial right thigh EXAM: ULTRASOUND RIGHT MEDIAL THIGH LIMITED TECHNIQUE: Ultrasound examination of the lower extremity soft tissues was performed in the area of clinical concern in longitudinal and transverse projection. COMPARISON:  None. FINDINGS: There is subcutaneous thickening consistent with cellulitis. No fluid collection or abscess evident. No evident mass or adenopathy. IMPRESSION: Evidence of a degree of cellulitis. No frank abscess or mass evident by ultrasound in the medial right thigh region. Electronically Signed   By: Lowella Grip III M.D.   On: 10/23/2017 12:17    Scheduled Meds: . bisacodyl  10 mg Rectal Once  . colchicine  0.3 mg Oral Daily  . febuxostat  40 mg Oral Daily  . feeding supplement (GLUCERNA SHAKE)  237 mL Oral TID BM  . furosemide  40 mg Intravenous Q12H  . multivitamin with minerals  1 tablet Oral  Daily  . pantoprazole  40 mg Oral Daily  . potassium chloride  40 mEq Oral BID  . sodium chloride flush  3 mL Intravenous Q12H  . Warfarin - Pharmacist Dosing Inpatient   Does not apply q1800   Continuous Infusions: . sodium chloride 1,000 mL (10/19/17 0533)  . cefTRIAXone (ROCEPHIN)  IV Stopped (10/23/17 2238)  . vancomycin Stopped (10/23/17 1806)     LOS: 8 days   Time spent: 25 minutes.  Vance Gather, MD [facility] Pager [phone]  If 7PM-7AM, please contact night-coverage [email or web address] Password [email or web address] 10/24/2017, 3:13 PM

## 2017-10-24 NOTE — Progress Notes (Signed)
Physical Therapy Treatment Patient Details Name: [name] MRN: [id number] DOB: 27-Jul-1933 Today's Date: 10/24/2017    History of Present Illness  B LE cellulitis    PT Comments    Assisted pt with using the bedside commode and transfers with walker. Ambulated 12 ft with min guard down hallway  Follow Up Recommendations  SNF  [facility]   Equipment Recommendations       Recommendations for Other Services       Precautions / Restrictions Precautions Precautions: Fall Restrictions Weight Bearing Restrictions: No    Mobility  Bed Mobility                  Transfers Overall transfer level: Needs assistance Equipment used: Rolling walker (2 wheeled) Transfers: Sit to/from Stand Sit to Stand: Min guard         General transfer comment: VC needed for sit to stand for reaching walker  Ambulation/Gait Ambulation/Gait assistance: Min guard Ambulation Distance (Feet): 80 Feet Assistive device: Rolling walker (2 wheeled) Gait Pattern/deviations: Step-through pattern;Step-to pattern;Trunk flexed     General Gait Details: VC needed for flexed posture and safety on turns   Stairs            Wheelchair Mobility    Modified Rankin (Stroke Patients Only)       Balance                                            Cognition Arousal/Alertness: Awake/alert Behavior During Therapy: WFL for tasks assessed/performed Overall Cognitive Status: Within Functional Limits for tasks assessed                                        Exercises      General Comments        Pertinent Vitals/Pain Pain Assessment: 0-10 Pain Score: 7  Pain Location: In cellulitis in R LE Pain Descriptors / Indicators: Burning;Throbbing    Home Living                      Prior Function            PT Goals (current goals can now be found in the care plan section)      Frequency    Min 3X/week      PT Plan  Current plan remains appropriate    Co-evaluation              AM-PAC PT "6 Clicks" Daily Activity  Outcome Measure  Difficulty turning over in bed (including adjusting bedclothes, sheets and blankets)?: A Lot Difficulty moving from lying on back to sitting on the side of the bed? : A Lot Difficulty sitting down on and standing up from a chair with arms (e.g., wheelchair, bedside commode, etc,.)?: A Little Help needed moving to and from a bed to chair (including a wheelchair)?: A Lot Help needed walking in hospital room?: A Little Help needed climbing 3-5 steps with a railing? : A Lot 6 Click Score: 14    End of Session Equipment Utilized During Treatment: Gait belt Activity Tolerance: Patient tolerated treatment well Patient left: in chair;with call bell/phone within reach;with chair alarm set Nurse Communication: Mobility status       Time: 4193-7902 PT Time Calculation (min) (  ACUTE ONLY): 24 min  Charges:  $Gait Training: 8-22 mins $Therapeutic Activity: 8-22 mins                    G Codes:         Almond Lint, [facility] [phone]  Reviewed and agree with above Rica Koyanagi  PTA [facility]  Acute  Rehab Pager      [phone]

## 2017-10-25 DIAGNOSIS — I481 Persistent atrial fibrillation: Secondary | ICD-10-CM

## 2017-10-25 LAB — BASIC METABOLIC PANEL
ANION GAP: 14 (ref 5–15)
BUN: 44 mg/dL — ABNORMAL HIGH (ref 6–20)
CALCIUM: 8.9 mg/dL (ref 8.9–10.3)
CO2: 28 mmol/L (ref 22–32)
Chloride: 94 mmol/L — ABNORMAL LOW (ref 101–111)
Creatinine, Ser: 1.05 mg/dL — ABNORMAL HIGH (ref 0.44–1.00)
GFR, EST AFRICAN AMERICAN: 56 mL/min — AB (ref >60)
GFR, EST NON AFRICAN AMERICAN: 48 mL/min — AB (ref >60)
Glucose, Bld: 100 mg/dL — ABNORMAL HIGH (ref 65–99)
Potassium: 4.1 mmol/L (ref 3.5–5.1)
Sodium: 136 mmol/L (ref 135–145)

## 2017-10-25 LAB — PROTIME-INR
INR: 2.71
PROTHROMBIN TIME: 28.5 s — AB (ref 11.4–15.2)

## 2017-10-25 LAB — VANCOMYCIN, TROUGH: Vancomycin Tr: 21 ug/mL (ref 15–20)

## 2017-10-25 LAB — GLUCOSE, CAPILLARY: GLUCOSE-CAPILLARY: 139 mg/dL — AB (ref 65–99)

## 2017-10-25 MED ORDER — OXYCODONE HCL 5 MG PO TABS
5.0000 mg | ORAL_TABLET | Freq: Four times a day (QID) | ORAL | Status: DC | PRN
  Administered 2017-10-25: 5 mg via ORAL
  Filled 2017-10-25 (×2): qty 1

## 2017-10-25 MED ORDER — WARFARIN SODIUM 2.5 MG PO TABS
2.5000 mg | ORAL_TABLET | Freq: Once | ORAL | Status: AC
  Administered 2017-10-25: 2.5 mg via ORAL
  Filled 2017-10-25: qty 1

## 2017-10-25 MED ORDER — INSULIN ASPART 100 UNIT/ML ~~LOC~~ SOLN: 0.0000 [IU] | Freq: Every day | SUBCUTANEOUS | Status: DC

## 2017-10-25 MED ORDER — CEPHALEXIN 500 MG PO CAPS
500.0000 mg | ORAL_CAPSULE | Freq: Two times a day (BID) | ORAL | Status: DC
  Administered 2017-10-25 – 2017-10-28 (×7): 500 mg via ORAL
  Filled 2017-10-25 (×7): qty 1

## 2017-10-25 MED ORDER — INSULIN ASPART 100 UNIT/ML ~~LOC~~ SOLN
0.0000 [IU] | Freq: Three times a day (TID) | SUBCUTANEOUS | Status: DC
  Administered 2017-10-26 – 2017-10-28 (×3): 2 [IU] via SUBCUTANEOUS

## 2017-10-25 MED ORDER — METOLAZONE 2.5 MG PO TABS
2.5000 mg | ORAL_TABLET | Freq: Once | ORAL | Status: AC
  Administered 2017-10-25: 2.5 mg via ORAL
  Filled 2017-10-25 (×2): qty 1

## 2017-10-25 NOTE — Progress Notes (Addendum)
PROGRESS NOTE  [name]  DZH:[id number] DOB: January 23, 1933 DOA: 10/16/2017 PCP: [name], MD  Brief Narrative: MARVELL STAVOLA is a 83 y.o. female with a history of CAD s/p CABG, chronic HFpEF, T2DM, AFib on coumadin who presented for right thigh pain and redness for several days and was admitted with cellulitis. Hospital course has been prolonged due to slow recovery.   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 No evidence of abscess or complication on exam or imaging. Not septic. No DVT on U/S.  - patient has been on antibiotics (vancomycin/ceftriaxone empirically) for 9 days , slow improvement , discussed with    ID , Dr Johnnye Sima for recommendations , he recommends switching over to keflex for 2 weeks  - Continue diuretics , to help with the edema  Bilateral lower extremity edema: Suspect combination venous stasis and acute on chronic diastolic CHF.  - Continue IV lasix , will add Zaroxolyn  , not much output last 24 hours - WOC consulted - Consider full unna's boots.   Sacrum fracture: Incidental finding on CT right femur 10/24. Pt reports having fallen on her bottom last month with some pain but this has improved and is not overly bothersome any more.  Will need SNF   CKD stage III: Stable.  - Monitor with diuresis.  Well-controlled T2DM: HbA1c 5.5%, unlikely to be contributing to poor healing.  - Holding metformin  Chronic atrial fibrillation: Rate controlled - Continue coumadin per pharmacy.   CAD s/p CABG and DES: No chest pain.  - Continue home medications  Gout: No  flare.  - Continue uloric, colchicine  Hypokalemia: Due to diuresis.  Monitor closely  DVT prophylaxis: Coumadin Code Status: Full Family Communication: Called daughter twice, left voicemail. Just heard back from her and discussed in detail. Disposition Plan: Anticipate discharge Monday to SNF  Consultants:   ID   Procedures:   None  Antimicrobials: Anti-infectives    Start     Dose/Rate Route Frequency Ordered Stop   10/23/17 1600  vancomycin (VANCOCIN) IVPB 1000 mg/200 mL premix     1,000 mg 200 mL/hr over 60 Minutes Intravenous Every 24 hours 10/22/17 1524     10/22/17 1530  vancomycin (VANCOCIN) 2,000 mg in sodium chloride 0.9 % 500 mL IVPB     2,000 mg 250 mL/hr over 120 Minutes Intravenous  Once 10/22/17 1524 10/22/17 1830   10/17/17 2200  cefTRIAXone (ROCEPHIN) 1 g in dextrose 5 % 50 mL IVPB     1 g 100 mL/hr over 30 Minutes Intravenous Every 24 hours 10/16/17 2258     10/16/17 2100  cefTRIAXone (ROCEPHIN) 1 g in dextrose 5 % 50 mL IVPB     1 g 100 mL/hr over 30 Minutes Intravenous  Once 10/16/17 2051 10/16/17 2242       Subjective: Feels that swelling in her legs has improved and she pain/ redness  is improving ,   Objective: Vitals:   10/24/17 0526 10/24/17 1534 10/24/17 2001 10/25/17 0551  BP: 102/76 109/81 137/73 95/60  Pulse: 74 87 94 89  Resp: 18  18 20 20   Temp: 98.7 F (37.1 C) 99.2 F (37.3 C) 98.9 F (37.2 C) 99.2 F (37.3 C)  TempSrc: Oral Oral Oral Oral  SpO2: 100% 98% 96% 99%  Weight:        Intake/Output Summary (Last 24 hours) at 10/25/17 0959 Last data filed at 10/25/17 9211  Gross per 24 hour  Intake              400 ml  Output             1181 ml  Net             -781 ml   Filed Weights   10/21/17 0605 10/22/17 0700 10/23/17 0439  Weight: 91.3 kg (201 lb 4.5 oz) 89.5 kg (197 lb 5 oz) 90.7 kg (200 lb)    Gen: Elderly female in no distress Pulm: Non-labored breathing room air. Clear to auscultation bilaterally.  CV: Irreg.  No murmur, rub, or gallop. No JVD GI: Abdomen soft, non-tender, non-distended, with normoactive bowel sounds. No organomegaly or masses felt. Ext: Medial right thigh with ~14cm indurated, tender erythema without fluctuance. Bilateral distal LE's with 2+ pitting edema and venous stasis changes.  Neuro: Alert and oriented. No focal neurological deficits. Psych: Judgement and insight appear normal. Mood & affect appropriate.   Data Reviewed: I have personally reviewed following labs and imaging studies  CBC:  Recent Labs Lab 10/19/17 0346 10/21/17 0417 10/23/17 0424  WBC 7.2 7.7 9.0  HGB 10.8* 10.9* 10.7*  HCT 33.9* 34.6* 33.8*  MCV 81.3 81.6 81.8  PLT 280 292 941   Basic Metabolic Panel:  Recent Labs Lab 10/19/17 0346 10/21/17 0417 10/23/17 0424 10/24/17 0801 10/25/17 0406  NA 132* 136 137 136 136  K 3.5 3.3* 3.1* 3.4* 4.1  CL 95* 98* 97* 94* 94*  CO2 24 28 28 30 28   GLUCOSE 105* 110* 106* 104* 100*  BUN 56* 60* 50* 47* 44*  CREATININE 1.41* 1.36* 1.18* 1.12* 1.05*  CALCIUM 8.5* 8.7* 8.5* 9.0 8.9  MG  --   --  1.3* 1.3*  --    GFR: Estimated Creatinine Clearance: 48.6 mL/min (A) (by C-G formula based on SCr of 1.05 mg/dL (H)). Liver Function Tests: No results for input(s): AST, ALT, ALKPHOS, BILITOT, PROT, ALBUMIN in the last 168 hours. No results for input(s): LIPASE, AMYLASE in the last 168 hours. No results for input(s): AMMONIA in the last 168 hours. Coagulation Profile:  Recent Labs Lab 10/21/17 0417 10/22/17 0334 10/23/17 0424 10/24/17 0412 10/25/17 0406  INR 2.83 2.70 2.80 3.22 2.71   Cardiac Enzymes: No results for input(s): CKTOTAL, CKMB, CKMBINDEX, TROPONINI in the last 168 hours. BNP (last 3 results) No results for input(s): PROBNP in the last 8760 hours. HbA1C: No results for input(s): HGBA1C in the last 72 hours. CBG:  Recent Labs Lab 10/20/17 2151 10/21/17 0754 10/21/17 1203 10/21/17 1650 10/21/17 2139  GLUCAP 121* 104* 136* 106*  158*   Lipid Profile: No results for input(s): CHOL, HDL, LDLCALC, TRIG, CHOLHDL, LDLDIRECT in the last 72 hours. Thyroid Function Tests: No results for input(s): TSH, T4TOTAL, FREET4, T3FREE, THYROIDAB in the last 72 hours. Anemia Panel: No results for input(s): VITAMINB12, FOLATE, FERRITIN, TIBC, IRON, RETICCTPCT in the last 72 hours. Urine analysis:    Component Value Date/Time   COLORURINE YELLOW 12/03/2016 1146   APPEARANCEUR CLEAR 12/03/2016 1146   LABSPEC 1.015 12/03/2016 1146   PHURINE 5.0 12/03/2016 1146   GLUCOSEU NEGATIVE 12/03/2016 1146  HGBUR NEGATIVE 12/03/2016 1146   BILIRUBINUR SMALL (A) 12/03/2016 1146   KETONESUR NEGATIVE 12/03/2016 1146   PROTEINUR 30 (A) 12/03/2016 1146   UROBILINOGEN 0.2 02/03/2013 1217   NITRITE NEGATIVE 12/03/2016 1146   LEUKOCYTESUR SMALL (A) 12/03/2016 1146   Recent Results (from the past 240 hour(s))  MRSA PCR Screening     Status: None   Collection Time: 10/16/17 11:47 PM  Result Value Ref Range Status   MRSA by PCR NEGATIVE NEGATIVE Final    Comment:        The GeneXpert MRSA Assay (FDA approved for NASAL specimens only), is one component of a comprehensive MRSA colonization surveillance program. It is not intended to diagnose MRSA infection nor to guide or monitor treatment for MRSA infections.       Radiology Studies: Ct Femur Right Wo Contrast  Result Date: 10/23/2017 CLINICAL DATA:  Right thigh erythema and pain. Chronic bilateral leg ulcers. EXAM: CT OF THE LOWER RIGHT EXTREMITY WITHOUT CONTRAST TECHNIQUE: Multidetector CT imaging of the right lower extremity was performed according to the standard protocol. COMPARISON:  Right hip MRI from 05/15/2005 FINDINGS: Bones/Joint/Cartilage The top-most images demonstrate an acute pelvic fracture involving the lower sacrum. No bony destructive findings characteristic of osteomyelitis. Prominent osteoarthritis of the knee the food with probable heterotopic ossification or chronic  calcified fat necrosis anterolateral to the distal femur. Small ossification lateral to the lateral meniscus on image 361/3. Severe medial compartmental articular space narrowing. Mild degenerative arthropathy of the hip on the right. Mild spurring of the pubic symphysis. Ligaments Suboptimally assessed by CT. Muscles and Tendons There is some low-level edema tracking along deep fascia planes in the thigh in a diffuse fashion, but less striking than the subcutaneous edema. Soft tissues Diffuse subcutaneous edema along the lower abdomen, right buttock and hip region, and tracking in the right thigh down into the visualized upper calf. Include a small part of the left thigh and it also shows diffuse subcutaneous edema in the visualized portion. Extensive pelvic ascites. Right inguinal lymph nodes are present measuring up to about 1.2 cm in diameter. Venous varicosities present posteromedially. Atherosclerotic calcification is noted. I do not observe a drainable abscess. IMPRESSION: 1. The patient appears to have an acute fracture the lower sacrum. This is only partially included on the very top most axial images. Please note that much of the bony pelvis was not included on today's CT exam of the femur. 2. Extensive third spacing of fluid, with diffuse subcutaneous edema, extensive pelvic ascites, and low-level edema tracking along the deep fascia planes of the thigh, cause uncertain. No hip or knee effusion to suggest septic joint. No gas in the soft tissues or drainable abscess identified. 3. Severe osteoarthritis of the right knee. 4. Venous varicosities posteromedially in the right thigh. Electronically Signed   By: Van Clines M.D.   On: 10/23/2017 16:25   [place] Rt Lower Extrem Ltd Soft Tissue Non Vascular  Result Date: 10/23/2017 CLINICAL DATA:  Soft tissue prominence medial right thigh EXAM: ULTRASOUND RIGHT MEDIAL THIGH LIMITED TECHNIQUE: Ultrasound examination of the lower extremity soft tissues was  performed in the area of clinical concern in longitudinal and transverse projection. COMPARISON:  None. FINDINGS: There is subcutaneous thickening consistent with cellulitis. No fluid collection or abscess evident. No evident mass or adenopathy. IMPRESSION: Evidence of a degree of cellulitis. No frank abscess or mass evident by ultrasound in the medial right thigh region. Electronically Signed   By: Lowella Grip III M.D.  On: 10/23/2017 12:17    Scheduled Meds: . colchicine  0.3 mg Oral Daily  . febuxostat  40 mg Oral Daily  . feeding supplement (GLUCERNA SHAKE)  237 mL Oral TID BM  . furosemide  40 mg Intravenous Q12H  . metolazone  2.5 mg Oral Once  . multivitamin with minerals  1 tablet Oral Daily  . pantoprazole  40 mg Oral Daily  . sodium chloride flush  3 mL Intravenous Q12H  . warfarin  2.5 mg Oral ONCE-1800  . Warfarin - Pharmacist Dosing Inpatient   Does not apply q1800   Continuous Infusions: . sodium chloride 1,000 mL (10/19/17 0533)  . cefTRIAXone (ROCEPHIN)  IV Stopped (10/24/17 2144)  . vancomycin Stopped (10/24/17 2007)     LOS: 9 days   Time spent: 25 minutes.  Reyne Dumas, MD [facility] Pager [phone]  If 7PM-7AM, please contact night-coverage [email or web address] Password [email or web address] 10/25/2017, 9:59 AM

## 2017-10-25 NOTE — Care Management Important Message (Signed)
Important Message  Patient Details  Name: [name] MRN: [id number] Date of Birth: 1933-06-19   Medicare Important Message Given:  Yes    Kerin Salen 10/25/2017, 11:36 AMImportant Message  Patient Details  Name: [name] MRN: [id number] Date of Birth: [birth year]   Medicare Important Message Given:  Yes    Kerin Salen 10/25/2017, 11:35 AM

## 2017-10-25 NOTE — Progress Notes (Signed)
Pharmacy Antibiotic Note  [name] is a 83 y.o. female admitted on 10/16/2017 with cellulitis.  Pharmacy has been consulted for Vancomycin dosing since clinically not improving on ceftriaxone alone.  Today, 10/25/2017: D10 Ceftriaxone D4 vancomycin Afebrile Scr improved WBC remains WNL  Plan:  Continue ceftriaxone 1g IV q24h  Vancomycin 1g IV q24h.  Measure Vanc trough tonight   Follow up renal fxn, culture results, and clinical course.   Weight: 200 lb (90.7 kg)  Temp (24hrs), Avg:99.1 F (37.3 C), Min:98.9 F (37.2 C), Max:99.2 F (37.3 C)   Recent Labs Lab 10/19/17 0346 10/21/17 0417 10/23/17 0424 10/24/17 0801 10/25/17 0406  WBC 7.2 7.7 9.0  --   --   CREATININE 1.41* 1.36* 1.18* 1.12* 1.05*    Estimated Creatinine Clearance: 48.6 mL/min (A) (by C-G formula based on SCr of 1.05 mg/dL (H)).    Allergies  Allergen Reactions  . Ace Inhibitors Other (See Comments)    Intolerance per Dr. Doug Sou note  . Amlodipine Other (See Comments)    Intolerance per Dr. Doug Sou note   . Statins Other (See Comments)    Muscle soreness  . Sulfa Drugs Cross Reactors Itching  . Zetia [Ezetimibe] Other (See Comments)    Muscle soreness  . Fenofibrate Other (See Comments)    Muscle soreness    Antimicrobials this admission: 10/17 Ceftriaxone >>  10/23 Vancomycin >>   Dose adjustments this admission: 10/26 1600 VT = ____  Microbiology results: 10/17 MRSA PCR: negative  Thank you for allowing pharmacy to be a part of this patient's care.  Gretta Arab PharmD, BCPS Pager [phone] 10/25/2017 8:25 AM

## 2017-10-25 NOTE — Progress Notes (Signed)
[facility] for warfarin Indication: atrial fibrillation  Allergies  Allergen Reactions  . Ace Inhibitors Other (See Comments)    Intolerance per Dr. Doug Sou note  . Amlodipine Other (See Comments)    Intolerance per Dr. Doug Sou note   . Statins Other (See Comments)    Muscle soreness  . Sulfa Drugs Cross Reactors Itching  . Zetia [Ezetimibe] Other (See Comments)    Muscle soreness  . Fenofibrate Other (See Comments)    Muscle soreness   Vital Signs: Temp: 99.2 F (37.3 C) (10/26 0551) Temp Source: Oral (10/26 0551) BP: 95/60 (10/26 0551) Pulse Rate: 89 (10/26 0551)  Labs:  Recent Labs  10/23/17 0424 10/24/17 0412 10/24/17 0801 10/25/17 0406  HGB 10.7*  --   --   --   HCT 33.8*  --   --   --   PLT 307  --   --   --   LABPROT 29.3* 32.7*  --  28.5*  INR 2.80 3.22  --  2.71  CREATININE 1.18*  --  1.12* 1.05*   Estimated Creatinine Clearance: 48.6 mL/min (A) (by C-G formula based on SCr of 1.05 mg/dL (H)).  Medications:  Scheduled:  . colchicine  0.3 mg Oral Daily  . febuxostat  40 mg Oral Daily  . feeding supplement (GLUCERNA SHAKE)  237 mL Oral TID BM  . furosemide  40 mg Intravenous Q12H  . multivitamin with minerals  1 tablet Oral Daily  . pantoprazole  40 mg Oral Daily  . sodium chloride flush  3 mL Intravenous Q12H  . Warfarin - Pharmacist Dosing Inpatient   Does not apply q1800   Infusions:  . sodium chloride 1,000 mL (10/19/17 0533)  . cefTRIAXone (ROCEPHIN)  IV Stopped (10/24/17 2144)  . vancomycin Stopped (10/24/17 2007)   Assessment: 37 yoF admitted on 10/17 with c/o right thigh and leg redness and pain on chronic warfarin for A-fib.  Home Warfarin: 5 mg on Sun/Tu/Thu/Sat, and 2.5 mg on M/W/F.  INR=3.38 on admission.  Today, 10/25/2017  INR 2.7, therapeutic   Diet: Eating 100% of meal trays + Glucerna  CBC: Hg slightly low, but stable (last on 10/24)  No bleeding reported  DDI: Broad spectrum  antibiotics (Ceftriaxone and vanc) may increase INR  Goal of Therapy:  INR 2-3   Plan:   Warfarin 2.5 mg PO today at 1800 x1  Daily PT/INR  Monitor CBC, s/sx bleed    Gretta Arab PharmD, BCPS Pager [phone] 10/25/2017 8:22 AM

## 2017-10-25 NOTE — Progress Notes (Signed)
CSW following for disposition. Plan to admit to [facility] building (SNF) at DC. Pt is from independent living there but currently in need of SNF for PT.  Will follow and assist.   Sharren Bridge, MSW, LCSW Clinical Social Work 10/25/2017 [phone]

## 2017-10-26 DIAGNOSIS — I482 Chronic atrial fibrillation: Secondary | ICD-10-CM

## 2017-10-26 LAB — COMPREHENSIVE METABOLIC PANEL
ALK PHOS: 112 U/L (ref 38–126)
ALT: 12 U/L — AB (ref 14–54)
AST: 20 U/L (ref 15–41)
Albumin: 3.3 g/dL — ABNORMAL LOW (ref 3.5–5.0)
Anion gap: 12 (ref 5–15)
BUN: 47 mg/dL — AB (ref 6–20)
CHLORIDE: 97 mmol/L — AB (ref 101–111)
CO2: 30 mmol/L (ref 22–32)
CREATININE: 1.24 mg/dL — AB (ref 0.44–1.00)
Calcium: 8.9 mg/dL (ref 8.9–10.3)
GFR calc Af Amer: 46 mL/min — ABNORMAL LOW (ref >60)
GFR, EST NON AFRICAN AMERICAN: 39 mL/min — AB (ref >60)
GLUCOSE: 109 mg/dL — AB (ref 65–99)
Potassium: 4.4 mmol/L (ref 3.5–5.1)
Sodium: 139 mmol/L (ref 135–145)
Total Bilirubin: 1.2 mg/dL (ref 0.3–1.2)
Total Protein: 7.3 g/dL (ref 6.5–8.1)

## 2017-10-26 LAB — CBC
HEMATOCRIT: 36.5 % (ref 36.0–46.0)
HEMOGLOBIN: 11.5 g/dL — AB (ref 12.0–15.0)
MCH: 25.9 pg — AB (ref 26.0–34.0)
MCHC: 31.5 g/dL (ref 30.0–36.0)
MCV: 82.2 fL (ref 78.0–100.0)
Platelets: 310 10*3/uL (ref 150–400)
RBC: 4.44 MIL/uL (ref 3.87–5.11)
RDW: 19.7 % — ABNORMAL HIGH (ref 11.5–15.5)
WBC: 9.5 10*3/uL (ref 4.0–10.5)

## 2017-10-26 LAB — HEMOGLOBIN A1C
Hgb A1c MFr Bld: 5.7 % — ABNORMAL HIGH (ref 4.8–5.6)
Mean Plasma Glucose: 116.89 mg/dL

## 2017-10-26 LAB — GLUCOSE, CAPILLARY
GLUCOSE-CAPILLARY: 129 mg/dL — AB (ref 65–99)
Glucose-Capillary: 107 mg/dL — ABNORMAL HIGH (ref 65–99)
Glucose-Capillary: 96 mg/dL (ref 65–99)
Glucose-Capillary: 118 mg/dL — ABNORMAL HIGH (ref 65–99)

## 2017-10-26 LAB — PROTIME-INR
INR: 2.59
Prothrombin Time: 27.5 seconds — ABNORMAL HIGH (ref 11.4–15.2)

## 2017-10-26 MED ORDER — OXYCODONE HCL 5 MG PO TABS
5.0000 mg | ORAL_TABLET | Freq: Four times a day (QID) | ORAL | Status: DC | PRN
  Administered 2017-10-27 (×2): 5 mg via ORAL
  Filled 2017-10-26 (×2): qty 1

## 2017-10-26 MED ORDER — HYDROCODONE-ACETAMINOPHEN 5-325 MG PO TABS
1.0000 | ORAL_TABLET | Freq: Four times a day (QID) | ORAL | Status: DC | PRN
  Administered 2017-10-26 (×2): 1 via ORAL
  Filled 2017-10-26 (×2): qty 1

## 2017-10-26 MED ORDER — WARFARIN SODIUM 2.5 MG PO TABS
2.5000 mg | ORAL_TABLET | Freq: Once | ORAL | Status: AC
  Administered 2017-10-26: 2.5 mg via ORAL
  Filled 2017-10-26: qty 1

## 2017-10-26 MED ORDER — METOLAZONE 2.5 MG PO TABS
2.5000 mg | ORAL_TABLET | Freq: Once | ORAL | Status: AC
  Administered 2017-10-26: 2.5 mg via ORAL
  Filled 2017-10-26: qty 1

## 2017-10-26 MED ORDER — MAGNESIUM SULFATE 2 GM/50ML IV SOLN
2.0000 g | Freq: Once | INTRAVENOUS | Status: AC
  Administered 2017-10-26: 2 g via INTRAVENOUS
  Filled 2017-10-26: qty 50

## 2017-10-26 NOTE — Progress Notes (Addendum)
[facility] for warfarin Indication: atrial fibrillation  Allergies  Allergen Reactions  . Ace Inhibitors Other (See Comments)    Intolerance per Dr. Doug Sou note  . Amlodipine Other (See Comments)    Intolerance per Dr. Doug Sou note   . Statins Other (See Comments)    Muscle soreness  . Sulfa Drugs Cross Reactors Itching  . Zetia [Ezetimibe] Other (See Comments)    Muscle soreness  . Fenofibrate Other (See Comments)    Muscle soreness   Vital Signs: Temp: 98.1 F (36.7 C) (10/27 0445) Temp Source: Oral (10/27 0445) BP: 97/62 (10/27 0445) Pulse Rate: 84 (10/27 0445)  Labs:  Recent Labs  10/24/17 0412 10/24/17 0801 10/25/17 0406 10/26/17 0358 10/26/17 0815  HGB  --   --   --   --  11.5*  HCT  --   --   --   --  36.5  PLT  --   --   --   --  310  LABPROT 32.7*  --  28.5* 27.5*  --   INR 3.22  --  2.71 2.59  --   CREATININE  --  1.12* 1.05* 1.24*  --    Estimated Creatinine Clearance: 40.4 mL/min (A) (by C-G formula based on SCr of 1.24 mg/dL (H)).  Medications:  Scheduled:  . cephALEXin  500 mg Oral Q12H  . colchicine  0.3 mg Oral Daily  . febuxostat  40 mg Oral Daily  . feeding supplement (GLUCERNA SHAKE)  237 mL Oral TID BM  . furosemide  40 mg Intravenous Q12H  . insulin aspart  0-15 Units Subcutaneous TID WC  . insulin aspart  0-5 Units Subcutaneous QHS  . multivitamin with minerals  1 tablet Oral Daily  . pantoprazole  40 mg Oral Daily  . sodium chloride flush  3 mL Intravenous Q12H  . Warfarin - Pharmacist Dosing Inpatient   Does not apply q1800   Infusions:  . sodium chloride 1,000 mL (10/19/17 0533)  . magnesium sulfate 1 - 4 g bolus IVPB 2 g (10/26/17 1246)   Assessment: 50 yoF admitted on 10/17 with c/o right thigh and leg redness and pain on chronic warfarin for A-fib.  Home Warfarin: 5 mg on Sun/Tu/Thu/Sat, and 2.5 mg on M/W/F.  INR=3.38 on admission.  Today, 10/26/2017  INR 2.59, therapeutic   Diet:  Eating 100% of meal trays + Glucerna  CBC: Hg slightly low, but stable (last on 10/27)  No bleeding reported  DDI: Broad spectrum antibiotics (Ceftriaxone and vanc) may increase INR  Goal of Therapy:  INR 2-3   Plan:   Warfarin 2.5 mg PO today at 1800 x1  Warfarin dosing requirements in hospital appear less than vs prior to admission  Daily PT/INR  Monitor CBC, s/sx bleed    Doreene Eland, PharmD, BCPS.   Pager: [phone] 10/26/2017 1:10 PM

## 2017-10-26 NOTE — Progress Notes (Signed)
PROGRESS NOTE  UVA RUNKEL  EVO:[id number] DOB: 04-Sep-1933 DOA: 10/16/2017 PCP: [name], MD  Brief Narrative:  [name] is a 83 y.o. female with a history of CAD s/p CABG, chronic HFpEF, T2DM, AFib on coumadin who presented for right thigh pain and redness for several days and was admitted with cellulitis. Hospital course has been prolonged due to slow recovery.    Subjective - Patient in bed, appears comfortable, denies any headache, no fever, no chest pain or pressure, no shortness of breath , no abdominal pain. No focal weakness.   Assessment & Plan:  Right thigh cellulitis: ? More edema related, No DVT on U/S,  has been on antibiotics (vancomycin/ceftriaxone empirically) for 9 days, now on PO Keflex, diurese for edema as I think that is the main component of her leg redness and swelling.  Bilateral lower extremity edema: Suspect combination venous stasis and acute on chronic diastolic CHF.  Continue IV lasix , give another PO Zaroxolyn, not much output last 24 hours, WOC consulted,  On Unna boots.   Sacrum fracture: Incidental finding on CT right femur 10/24. Pt reports having fallen on her bottom last month with some pain but this has improved and is not overly bothersome any more.  Will need SNF   CKD stage III: Stable.  Monitor with diuresis.  Well-controlled T2DM: HbA1c 5.5%, unlikely to be contributing to poor healing.  Holding metformin  CBG (last 3)   Recent Labs  10/25/17 2145 10/26/17 0807 10/26/17 1211  GLUCAP 139* 107* 96   Chronic atrial fibrillation: Rate controlled,  Continue coumadin per pharmacy. [place] vasc 2 score of at least 3.  CAD s/p CABG and DES: No chest pain.  Continue home medications  Gout: No flare.  Continue uloric, colchicine     DVT prophylaxis: Coumadin  Lab Results  Component Value Date   INR 2.59 10/26/2017   INR 2.71 10/25/2017   INR 3.22 10/24/2017    Code Status: Full Family Communication: Called daughter  twice, left voicemail. Just heard back from her and discussed in detail. Disposition Plan: Anticipate discharge Monday to SNF  Consultants:  ID   Procedures:   None  Antimicrobials:  Anti-infectives    Start     Dose/Rate Route Frequency Ordered Stop   10/25/17 1200  cephALEXin (KEFLEX) capsule 500 mg     500 mg Oral Every 12 hours 10/25/17 1112     10/23/17 1600  vancomycin (VANCOCIN) IVPB 1000 mg/200 mL premix  Status:  Discontinued     1,000 mg 200 mL/hr over 60 Minutes Intravenous Every 24 hours 10/22/17 1524 10/25/17 1112   10/22/17 1530  vancomycin (VANCOCIN) 2,000 mg in sodium chloride 0.9 % 500 mL IVPB     2,000 mg 250 mL/hr over 120 Minutes Intravenous  Once 10/22/17 1524 10/22/17 1830   10/17/17 2200  cefTRIAXone (ROCEPHIN) 1 g in dextrose 5 % 50 mL IVPB  Status:  Discontinued     1 g 100 mL/hr over 30 Minutes Intravenous Every 24 hours 10/16/17 2258 10/25/17 1112   10/16/17 2100  cefTRIAXone (ROCEPHIN) 1 g in dextrose 5 % 50 mL IVPB     1 g 100 mL/hr over 30 Minutes Intravenous  Once 10/16/17 2051 10/16/17 2242       Subjective: Feels that swelling in her legs has improved and she pain/ redness  is improving ,   Objective: Vitals:   10/25/17 1100 10/25/17 1610 10/25/17 2152 10/26/17 0445  BP:  [name])  93/52 (!) 86/62 97/62  Pulse:   86 84  Resp:  20 16 16   Temp:  98.1 F (36.7 C) 98 F (36.7 C) 98.1 F (36.7 C)  TempSrc:  Oral Axillary Oral  SpO2:  98% (!) 88% 95%  Weight: 88.8 kg (195 lb 12.3 oz)   86.8 kg (191 lb 5.8 oz)    Intake/Output Summary (Last 24 hours) at 10/26/17 1212 Last data filed at 10/26/17 1127  Gross per 24 hour  Intake              240 ml  Output             1850 ml  Net            -1610 ml   Filed Weights   10/23/17 0439 10/25/17 1100 10/26/17 0445  Weight: 90.7 kg (200 lb) 88.8 kg (195 lb 12.3 oz) 86.8 kg (191 lb 5.8 oz)    Exam  Awake Alert, Oriented X 3, No new F.N deficits, Normal affect [place].AT,PERRAL Supple Neck,No JVD, No  cervical lymphadenopathy appriciated.  Symmetrical Chest wall movement, Good air movement bilaterally, CTAB RRR,No Gallops,Rubs or new Murmurs, No Parasternal Heave +ve B.Sounds, Abd Soft, No tenderness, No organomegaly appriciated, No rebound - guarding or rigidity. No Cyanosis, Clubbing, 1+ upto thigh edema , mild redness and warmth around the thigh, UNNA boots both legs   Data Reviewed: I have personally reviewed following labs and imaging studies  CBC:  Recent Labs Lab 10/21/17 0417 10/23/17 0424 10/26/17 0815  WBC 7.7 9.0 9.5  HGB 10.9* 10.7* 11.5*  HCT 34.6* 33.8* 36.5  MCV 81.6 81.8 82.2  PLT 292 307 629   Basic Metabolic Panel:  Recent Labs Lab 10/21/17 0417 10/23/17 0424 10/24/17 0801 10/25/17 0406 10/26/17 0358  NA 136 137 136 136 139  K 3.3* 3.1* 3.4* 4.1 4.4  CL 98* 97* 94* 94* 97*  CO2 28 28 30 28 30   GLUCOSE 110* 106* 104* 100* 109*  BUN 60* 50* 47* 44* 47*  CREATININE 1.36* 1.18* 1.12* 1.05* 1.24*  CALCIUM 8.7* 8.5* 9.0 8.9 8.9  MG  --  1.3* 1.3*  --   --    GFR: Estimated Creatinine Clearance: 40.4 mL/min (A) (by C-G formula based on SCr of 1.24 mg/dL (H)). Liver Function Tests:  Recent Labs Lab 10/26/17 0358  AST 20  ALT 12*  ALKPHOS 112  BILITOT 1.2  PROT 7.3  ALBUMIN 3.3*   No results for input(s): LIPASE, AMYLASE in the last 168 hours. No results for input(s): AMMONIA in the last 168 hours. Coagulation Profile:  Recent Labs Lab 10/22/17 0334 10/23/17 0424 10/24/17 0412 10/25/17 0406 10/26/17 0358  INR 2.70 2.80 3.22 2.71 2.59   Cardiac Enzymes: No results for input(s): CKTOTAL, CKMB, CKMBINDEX, TROPONINI in the last 168 hours. BNP (last 3 results) No results for input(s): PROBNP in the last 8760 hours. HbA1C:  Recent Labs  10/26/17 0358  HGBA1C 5.7*   CBG:  Recent Labs Lab 10/21/17 1203 10/21/17 1650 10/21/17 2139 10/25/17 2145 10/26/17 0807  GLUCAP 136* 106* 158* 139* 107*   Lipid Profile: No results for  input(s): CHOL, HDL, LDLCALC, TRIG, CHOLHDL, LDLDIRECT in the last 72 hours. Thyroid Function Tests: No results for input(s): TSH, T4TOTAL, FREET4, T3FREE, THYROIDAB in the last 72 hours. Anemia Panel: No results for input(s): VITAMINB12, FOLATE, FERRITIN, TIBC, IRON, RETICCTPCT in the last 72 hours. Urine analysis:    Component Value Date/Time   COLORURINE YELLOW 12/03/2016 1146  APPEARANCEUR CLEAR 12/03/2016 1146   LABSPEC 1.015 12/03/2016 1146   PHURINE 5.0 12/03/2016 1146   GLUCOSEU NEGATIVE 12/03/2016 1146   HGBUR NEGATIVE 12/03/2016 1146   BILIRUBINUR SMALL (A) 12/03/2016 1146   KETONESUR NEGATIVE 12/03/2016 1146   PROTEINUR 30 (A) 12/03/2016 1146   UROBILINOGEN 0.2 02/03/2013 1217   NITRITE NEGATIVE 12/03/2016 1146   LEUKOCYTESUR SMALL (A) 12/03/2016 1146   Recent Results (from the past 240 hour(s))  MRSA PCR Screening     Status: None   Collection Time: 10/16/17 11:47 PM  Result Value Ref Range Status   MRSA by PCR NEGATIVE NEGATIVE Final    Comment:        The GeneXpert MRSA Assay (FDA approved for NASAL specimens only), is one component of a comprehensive MRSA colonization surveillance program. It is not intended to diagnose MRSA infection nor to guide or monitor treatment for MRSA infections.       Radiology Studies: No results found.  Scheduled Meds: . cephALEXin  500 mg Oral Q12H  . colchicine  0.3 mg Oral Daily  . febuxostat  40 mg Oral Daily  . feeding supplement (GLUCERNA SHAKE)  237 mL Oral TID BM  . furosemide  40 mg Intravenous Q12H  . insulin aspart  0-15 Units Subcutaneous TID WC  . insulin aspart  0-5 Units Subcutaneous QHS  . metolazone  2.5 mg Oral Once  . multivitamin with minerals  1 tablet Oral Daily  . pantoprazole  40 mg Oral Daily  . sodium chloride flush  3 mL Intravenous Q12H  . Warfarin - Pharmacist Dosing Inpatient   Does not apply q1800   Continuous Infusions: . sodium chloride 1,000 mL (10/19/17 0533)  . magnesium sulfate  1 - 4 g bolus IVPB       LOS: 10 days   Time spent: 25 minutes.  Signature  Lala Lund M.D on 10/26/2017 at 12:14 PM  Between 7am to 7pm - Pager - [phone] ( page via [email or web address], text pages only, please mention full 10 digit call back number).  After 7pm go to [email or web address] - password [facility]

## 2017-10-26 NOTE — Progress Notes (Signed)
Metolazone medication education done and handout given to pt.

## 2017-10-27 LAB — PROTIME-INR
INR: 2.33
PROTHROMBIN TIME: 25.4 s — AB (ref 11.4–15.2)

## 2017-10-27 LAB — MAGNESIUM: Magnesium: 1.7 mg/dL (ref 1.7–2.4)

## 2017-10-27 LAB — BASIC METABOLIC PANEL
ANION GAP: 14 (ref 5–15)
BUN: 44 mg/dL — AB (ref 6–20)
CO2: 29 mmol/L (ref 22–32)
Calcium: 8.8 mg/dL — ABNORMAL LOW (ref 8.9–10.3)
Chloride: 92 mmol/L — ABNORMAL LOW (ref 101–111)
Creatinine, Ser: 1.2 mg/dL — ABNORMAL HIGH (ref 0.44–1.00)
GFR, EST AFRICAN AMERICAN: 47 mL/min — AB (ref >60)
GFR, EST NON AFRICAN AMERICAN: 41 mL/min — AB (ref >60)
Glucose, Bld: 160 mg/dL — ABNORMAL HIGH (ref 65–99)
POTASSIUM: 3.5 mmol/L (ref 3.5–5.1)
SODIUM: 135 mmol/L (ref 135–145)

## 2017-10-27 LAB — GLUCOSE, CAPILLARY
GLUCOSE-CAPILLARY: 93 mg/dL (ref 65–99)
GLUCOSE-CAPILLARY: 104 mg/dL — AB (ref 65–99)
Glucose-Capillary: 121 mg/dL — ABNORMAL HIGH (ref 65–99)
Glucose-Capillary: 145 mg/dL — ABNORMAL HIGH (ref 65–99)

## 2017-10-27 MED ORDER — FUROSEMIDE 10 MG/ML IJ SOLN
60.0000 mg | Freq: Two times a day (BID) | INTRAMUSCULAR | Status: DC
  Administered 2017-10-27 – 2017-10-28 (×2): 60 mg via INTRAVENOUS
  Filled 2017-10-27 (×2): qty 6

## 2017-10-27 MED ORDER — FUROSEMIDE 10 MG/ML IJ SOLN
20.0000 mg | Freq: Once | INTRAMUSCULAR | Status: AC
  Administered 2017-10-27: 20 mg via INTRAVENOUS
  Filled 2017-10-27: qty 2

## 2017-10-27 MED ORDER — METOLAZONE 2.5 MG PO TABS
2.5000 mg | ORAL_TABLET | Freq: Once | ORAL | Status: AC
  Administered 2017-10-27: 2.5 mg via ORAL
  Filled 2017-10-27 (×2): qty 1

## 2017-10-27 MED ORDER — BISACODYL 10 MG RE SUPP
10.0000 mg | Freq: Every day | RECTAL | Status: DC
  Administered 2017-10-28: 10 mg via RECTAL
  Filled 2017-10-27: qty 1

## 2017-10-27 MED ORDER — WARFARIN SODIUM 5 MG PO TABS
5.0000 mg | ORAL_TABLET | Freq: Once | ORAL | Status: AC
  Administered 2017-10-27: 5 mg via ORAL
  Filled 2017-10-27: qty 1

## 2017-10-27 MED ORDER — POTASSIUM CHLORIDE CRYS ER 20 MEQ PO TBCR
40.0000 meq | EXTENDED_RELEASE_TABLET | Freq: Every day | ORAL | Status: DC
  Administered 2017-10-27: 40 meq via ORAL
  Filled 2017-10-27: qty 2

## 2017-10-27 NOTE — Progress Notes (Signed)
PROGRESS NOTE  [name]  JYN:[id number] DOB: Apr 30, 1933 DOA: 10/16/2017 PCP: [name], MD  Brief Narrative:  [name] is a 83 y.o. female with a history of CAD s/p CABG, chronic HFpEF, T2DM, AFib on coumadin who presented for right thigh pain and redness for several days and was admitted with cellulitis. Hospital course has been prolonged due to slow recovery.    Subjective - Patient in bed, appears comfortable, denies any headache, no fever, no chest pain or pressure, no shortness of breath , no abdominal pain. No focal weakness.    Assessment & Plan:  Right thigh cellulitis: I suspect chronic edema induced to redness more than infection, lower extremity venous duplex unremarkable, was on empiric antibiotics for 9 days which included combination of vancomycin and Rocephin, currently on p.o. Keflex, redness improving remarkably with ongoing diuresis which will be continued.  Advance activity likely discharge in 1-2 days to SNF.  Bilateral lower extremity edema: Suspect combination venous stasis and acute on chronic diastolic CHF recent EF 86%.  Continue diuresis with IV Lasix dose has been increased, continue oral Zaroxolyn along with potassium supplementation, on Unna boots.  Edema has considerably improved.  Sacrum fracture: Incidental finding on CT right femur 10/24. Pt reports having fallen on her bottom last month with some pain but this has improved and is not overly bothersome any more.  Will need SNF  CKD stage III: Stable.  Creatinine actually improving with diuresis which will be continued.  Well-controlled T2DM: HbA1c 5.5%, unlikely to be contributing to poor healing.  Holding metformin  CBG (last 3)   Recent Labs  10/26/17 1704 10/26/17 2047 10/27/17 0748  GLUCAP 129* 118* 93   Chronic atrial fibrillation: Rate controlled,  Continue coumadin per pharmacy. [place] vasc 2 score of at least 3.  CAD s/p CABG and DES: No chest pain.  Continue home  medications  Gout: No flare.  Continue uloric, colchicine   DVT prophylaxis: Coumadin  Lab Results  Component Value Date   INR 2.33 10/27/2017   INR 2.59 10/26/2017   INR 2.71 10/25/2017    Code Status: Full Family Communication: Called daughter twice, left voicemail. Just heard back from her and discussed in detail. Disposition Plan: Anticipate discharge Monday to SNF  Consultants:  ID   Procedures:   None  Antimicrobials:  Anti-infectives    Start     Dose/Rate Route Frequency Ordered Stop   10/25/17 1200  cephALEXin (KEFLEX) capsule 500 mg     500 mg Oral Every 12 hours 10/25/17 1112     10/23/17 1600  vancomycin (VANCOCIN) IVPB 1000 mg/200 mL premix  Status:  Discontinued     1,000 mg 200 mL/hr over 60 Minutes Intravenous Every 24 hours 10/22/17 1524 10/25/17 1112   10/22/17 1530  vancomycin (VANCOCIN) 2,000 mg in sodium chloride 0.9 % 500 mL IVPB     2,000 mg 250 mL/hr over 120 Minutes Intravenous  Once 10/22/17 1524 10/22/17 1830   10/17/17 2200  cefTRIAXone (ROCEPHIN) 1 g in dextrose 5 % 50 mL IVPB  Status:  Discontinued     1 g 100 mL/hr over 30 Minutes Intravenous Every 24 hours 10/16/17 2258 10/25/17 1112   10/16/17 2100  cefTRIAXone (ROCEPHIN) 1 g in dextrose 5 % 50 mL IVPB     1 g 100 mL/hr over 30 Minutes Intravenous  Once 10/16/17 2051 10/16/17 2242       Subjective: Feels that swelling in her legs has improved and she  pain/ redness  is improving ,   Objective: Vitals:   10/26/17 0445 10/26/17 1447 10/26/17 2048 10/27/17 0620  BP: 97/62 105/76 104/69   Pulse: 84 93 89   Resp: 16 18 16    Temp: 98.1 F (36.7 C) 98.2 F (36.8 C) 98.4 F (36.9 C)   TempSrc: Oral Oral Axillary   SpO2: 95% 94% 96%   Weight: 86.8 kg (191 lb 5.8 oz)   86.7 kg (191 lb 2.2 oz)  Height:    5\' 8"  (1.727 m)    Intake/Output Summary (Last 24 hours) at 10/27/17 1001 Last data filed at 10/26/17 2200  Gross per 24 hour  Intake              483 ml  Output              1525 ml  Net            -1042 ml   Filed Weights   10/25/17 1100 10/26/17 0445 10/27/17 0620  Weight: 88.8 kg (195 lb 12.3 oz) 86.8 kg (191 lb 5.8 oz) 86.7 kg (191 lb 2.2 oz)    Exam  Awake Alert, Oriented X 3, No new F.N deficits, Normal affect [place].AT,PERRAL Supple Neck,No JVD, No cervical lymphadenopathy appriciated.  Symmetrical Chest wall movement, Good air movement bilaterally, CTAB RRR,No Gallops, Rubs or new Murmurs, No Parasternal Heave +ve B.Sounds, Abd Soft, No tenderness, No organomegaly appriciated, No rebound - guarding or rigidity. No Cyanosis, trace edema upto thigh edema , mild redness and warmth around the thigh, UNNA boots both legs   Data Reviewed: I have personally reviewed following labs and imaging studies  CBC:  Recent Labs Lab 10/21/17 0417 10/23/17 0424 10/26/17 0815  WBC 7.7 9.0 9.5  HGB 10.9* 10.7* 11.5*  HCT 34.6* 33.8* 36.5  MCV 81.6 81.8 82.2  PLT 292 307 329   Basic Metabolic Panel:  Recent Labs Lab 10/23/17 0424 10/24/17 0801 10/25/17 0406 10/26/17 0358 10/27/17 0427 10/27/17 0851  NA 137 136 136 139  --  135  K 3.1* 3.4* 4.1 4.4  --  3.5  CL 97* 94* 94* 97*  --  92*  CO2 28 30 28 30   --  29  GLUCOSE 106* 104* 100* 109*  --  160*  BUN 50* 47* 44* 47*  --  44*  CREATININE 1.18* 1.12* 1.05* 1.24*  --  1.20*  CALCIUM 8.5* 9.0 8.9 8.9  --  8.8*  MG 1.3* 1.3*  --   --  1.7  --    GFR: Estimated Creatinine Clearance: 41.7 mL/min (A) (by C-G formula based on SCr of 1.2 mg/dL (H)). Liver Function Tests:  Recent Labs Lab 10/26/17 0358  AST 20  ALT 12*  ALKPHOS 112  BILITOT 1.2  PROT 7.3  ALBUMIN 3.3*   No results for input(s): LIPASE, AMYLASE in the last 168 hours. No results for input(s): AMMONIA in the last 168 hours. Coagulation Profile:  Recent Labs Lab 10/23/17 0424 10/24/17 0412 10/25/17 0406 10/26/17 0358 10/27/17 0427  INR 2.80 3.22 2.71 2.59 2.33   Cardiac Enzymes: No results for input(s): CKTOTAL, CKMB,  CKMBINDEX, TROPONINI in the last 168 hours. BNP (last 3 results) No results for input(s): PROBNP in the last 8760 hours. HbA1C:  Recent Labs  10/26/17 0358  HGBA1C 5.7*   CBG:  Recent Labs Lab 10/26/17 0807 10/26/17 1211 10/26/17 1704 10/26/17 2047 10/27/17 0748  GLUCAP 107* 96 129* 118* 93   Lipid Profile: No results for  input(s): CHOL, HDL, LDLCALC, TRIG, CHOLHDL, LDLDIRECT in the last 72 hours. Thyroid Function Tests: No results for input(s): TSH, T4TOTAL, FREET4, T3FREE, THYROIDAB in the last 72 hours. Anemia Panel: No results for input(s): VITAMINB12, FOLATE, FERRITIN, TIBC, IRON, RETICCTPCT in the last 72 hours. Urine analysis:    Component Value Date/Time   COLORURINE YELLOW 12/03/2016 1146   APPEARANCEUR CLEAR 12/03/2016 1146   LABSPEC 1.015 12/03/2016 1146   PHURINE 5.0 12/03/2016 1146   GLUCOSEU NEGATIVE 12/03/2016 1146   HGBUR NEGATIVE 12/03/2016 1146   BILIRUBINUR SMALL (A) 12/03/2016 1146   KETONESUR NEGATIVE 12/03/2016 1146   PROTEINUR 30 (A) 12/03/2016 1146   UROBILINOGEN 0.2 02/03/2013 1217   NITRITE NEGATIVE 12/03/2016 1146   LEUKOCYTESUR SMALL (A) 12/03/2016 1146   No results found for this or any previous visit (from the past 240 hour(s)).    Radiology Studies: No results found.  Scheduled Meds: . cephALEXin  500 mg Oral Q12H  . colchicine  0.3 mg Oral Daily  . febuxostat  40 mg Oral Daily  . feeding supplement (GLUCERNA SHAKE)  237 mL Oral TID BM  . furosemide  60 mg Intravenous Q12H  . insulin aspart  0-15 Units Subcutaneous TID WC  . insulin aspart  0-5 Units Subcutaneous QHS  . metolazone  2.5 mg Oral Once  . multivitamin with minerals  1 tablet Oral Daily  . pantoprazole  40 mg Oral Daily  . potassium chloride  40 mEq Oral Daily  . sodium chloride flush  3 mL Intravenous Q12H  . Warfarin - Pharmacist Dosing Inpatient   Does not apply q1800   Continuous Infusions: . sodium chloride 1,000 mL (10/19/17 0533)     LOS: 11 days    Time spent: 25 minutes.  Signature  Lala Lund M.D on 10/27/2017 at 10:01 AM  Between 7am to 7pm - Pager - [phone] ( page via [email or web address], text pages only, please mention full 10 digit call back number).  After 7pm go to [email or web address] - password [facility]

## 2017-10-27 NOTE — Progress Notes (Signed)
[place] for warfarin Indication: atrial fibrillation  Allergies  Allergen Reactions  . Ace Inhibitors Other (See Comments)    Intolerance per Dr. Doug Sou note  . Amlodipine Other (See Comments)    Intolerance per Dr. Doug Sou note   . Statins Other (See Comments)    Muscle soreness  . Sulfa Drugs Cross Reactors Itching  . Zetia [Ezetimibe] Other (See Comments)    Muscle soreness  . Fenofibrate Other (See Comments)    Muscle soreness   Vital Signs:    Labs:  Recent Labs  10/25/17 0406 10/26/17 0358 10/26/17 0815 10/27/17 0427 10/27/17 0851  HGB  --   --  11.5*  --   --   HCT  --   --  36.5  --   --   PLT  --   --  310  --   --   LABPROT 28.5* 27.5*  --  25.4*  --   INR 2.71 2.59  --  2.33  --   CREATININE 1.05* 1.24*  --   --  1.20*   Estimated Creatinine Clearance: 41.7 mL/min (A) (by C-G formula based on SCr of 1.2 mg/dL (H)).  Medications:  Scheduled:  . cephALEXin  500 mg Oral Q12H  . colchicine  0.3 mg Oral Daily  . febuxostat  40 mg Oral Daily  . feeding supplement (GLUCERNA SHAKE)  237 mL Oral TID BM  . furosemide  60 mg Intravenous Q12H  . insulin aspart  0-15 Units Subcutaneous TID WC  . insulin aspart  0-5 Units Subcutaneous QHS  . metolazone  2.5 mg Oral Once  . multivitamin with minerals  1 tablet Oral Daily  . pantoprazole  40 mg Oral Daily  . potassium chloride  40 mEq Oral Daily  . sodium chloride flush  3 mL Intravenous Q12H  . Warfarin - Pharmacist Dosing Inpatient   Does not apply q1800   Infusions:  . sodium chloride 1,000 mL (10/19/17 0533)   Assessment: [name] admitted on 10/17 with c/o right thigh and leg redness and pain on chronic warfarin for A-fib.  Home Warfarin: 5 mg on Sun/Tu/Thu/Sat, and 2.5 mg on M/W/F.  INR=3.38 on admission.  Today, 10/27/2017  INR 2.33, therapeutic   Diet: Eating 100% of meal trays + Glucerna  CBC: Hg slightly low, but stable (last on 10/27)  No bleeding  reported  DDI: Broad spectrum antibiotics (Ceftriaxone and vanc) may increase INR  Goal of Therapy:  INR 2-3   Plan:   Warfarin 5 mg PO today at 1800 x1 as per home dose as INR trending down with lower dosing  Warfarin dosing requirements in hospital appear less than vs prior to admission  Daily PT/INR  Monitor CBC, s/sx bleed    Doreene Eland, PharmD, BCPS.   Pager: [phone] 10/27/2017 11:59 AM

## 2017-10-27 NOTE — Progress Notes (Signed)
Pt having some rectal bleeding when trying to have a BM. Dr. Candiss Norse aware suppository ordered, pt refused to have tonight

## 2017-10-28 ENCOUNTER — Ambulatory Visit: Payer: Medicare Other | Admitting: Family Medicine

## 2017-10-28 DIAGNOSIS — N183 Chronic kidney disease, stage 3 (moderate): Secondary | ICD-10-CM | POA: Diagnosis not present

## 2017-10-28 DIAGNOSIS — M545 Low back pain: Secondary | ICD-10-CM | POA: Diagnosis not present

## 2017-10-28 DIAGNOSIS — E871 Hypo-osmolality and hyponatremia: Secondary | ICD-10-CM | POA: Diagnosis not present

## 2017-10-28 DIAGNOSIS — M79605 Pain in left leg: Secondary | ICD-10-CM | POA: Diagnosis not present

## 2017-10-28 DIAGNOSIS — L299 Pruritus, unspecified: Secondary | ICD-10-CM | POA: Diagnosis not present

## 2017-10-28 DIAGNOSIS — J181 Lobar pneumonia, unspecified organism: Secondary | ICD-10-CM | POA: Diagnosis not present

## 2017-10-28 DIAGNOSIS — A409 Streptococcal sepsis, unspecified: Secondary | ICD-10-CM | POA: Diagnosis present

## 2017-10-28 DIAGNOSIS — Z961 Presence of intraocular lens: Secondary | ICD-10-CM | POA: Diagnosis present

## 2017-10-28 DIAGNOSIS — Z66 Do not resuscitate: Secondary | ICD-10-CM | POA: Diagnosis present

## 2017-10-28 DIAGNOSIS — D638 Anemia in other chronic diseases classified elsewhere: Secondary | ICD-10-CM | POA: Diagnosis not present

## 2017-10-28 DIAGNOSIS — I5033 Acute on chronic diastolic (congestive) heart failure: Secondary | ICD-10-CM | POA: Diagnosis not present

## 2017-10-28 DIAGNOSIS — L03119 Cellulitis of unspecified part of limb: Secondary | ICD-10-CM | POA: Diagnosis not present

## 2017-10-28 DIAGNOSIS — S71001A Unspecified open wound, right hip, initial encounter: Secondary | ICD-10-CM | POA: Diagnosis not present

## 2017-10-28 DIAGNOSIS — R946 Abnormal results of thyroid function studies: Secondary | ICD-10-CM | POA: Diagnosis present

## 2017-10-28 DIAGNOSIS — E0781 Sick-euthyroid syndrome: Secondary | ICD-10-CM | POA: Diagnosis present

## 2017-10-28 DIAGNOSIS — L89893 Pressure ulcer of other site, stage 3: Secondary | ICD-10-CM | POA: Diagnosis not present

## 2017-10-28 DIAGNOSIS — N179 Acute kidney failure, unspecified: Secondary | ICD-10-CM | POA: Diagnosis not present

## 2017-10-28 DIAGNOSIS — R609 Edema, unspecified: Secondary | ICD-10-CM | POA: Diagnosis not present

## 2017-10-28 DIAGNOSIS — S3991XA Unspecified injury of abdomen, initial encounter: Secondary | ICD-10-CM | POA: Diagnosis not present

## 2017-10-28 DIAGNOSIS — Z794 Long term (current) use of insulin: Secondary | ICD-10-CM | POA: Diagnosis not present

## 2017-10-28 DIAGNOSIS — I83009 Varicose veins of unspecified lower extremity with ulcer of unspecified site: Secondary | ICD-10-CM | POA: Diagnosis not present

## 2017-10-28 DIAGNOSIS — J309 Allergic rhinitis, unspecified: Secondary | ICD-10-CM | POA: Diagnosis not present

## 2017-10-28 DIAGNOSIS — R4189 Other symptoms and signs involving cognitive functions and awareness: Secondary | ICD-10-CM | POA: Diagnosis not present

## 2017-10-28 DIAGNOSIS — L02415 Cutaneous abscess of right lower limb: Secondary | ICD-10-CM | POA: Diagnosis not present

## 2017-10-28 DIAGNOSIS — C220 Liver cell carcinoma: Secondary | ICD-10-CM | POA: Diagnosis present

## 2017-10-28 DIAGNOSIS — M10241 Drug-induced gout, right hand: Secondary | ICD-10-CM | POA: Diagnosis not present

## 2017-10-28 DIAGNOSIS — L03032 Cellulitis of left toe: Secondary | ICD-10-CM | POA: Diagnosis not present

## 2017-10-28 DIAGNOSIS — R2681 Unsteadiness on feet: Secondary | ICD-10-CM | POA: Diagnosis not present

## 2017-10-28 DIAGNOSIS — I272 Pulmonary hypertension, unspecified: Secondary | ICD-10-CM | POA: Diagnosis not present

## 2017-10-28 DIAGNOSIS — R1319 Other dysphagia: Secondary | ICD-10-CM | POA: Diagnosis not present

## 2017-10-28 DIAGNOSIS — L84 Corns and callosities: Secondary | ICD-10-CM | POA: Diagnosis not present

## 2017-10-28 DIAGNOSIS — F320 Major depressive disorder, single episode, mild: Secondary | ICD-10-CM | POA: Diagnosis not present

## 2017-10-28 DIAGNOSIS — L03116 Cellulitis of left lower limb: Secondary | ICD-10-CM | POA: Diagnosis not present

## 2017-10-28 DIAGNOSIS — L89892 Pressure ulcer of other site, stage 2: Secondary | ICD-10-CM | POA: Diagnosis not present

## 2017-10-28 DIAGNOSIS — M1A9XX0 Chronic gout, unspecified, without tophus (tophi): Secondary | ICD-10-CM | POA: Diagnosis not present

## 2017-10-28 DIAGNOSIS — L02413 Cutaneous abscess of right upper limb: Secondary | ICD-10-CM | POA: Diagnosis not present

## 2017-10-28 DIAGNOSIS — R5381 Other malaise: Secondary | ICD-10-CM | POA: Diagnosis not present

## 2017-10-28 DIAGNOSIS — E1139 Type 2 diabetes mellitus with other diabetic ophthalmic complication: Secondary | ICD-10-CM | POA: Diagnosis not present

## 2017-10-28 DIAGNOSIS — I959 Hypotension, unspecified: Secondary | ICD-10-CM | POA: Diagnosis not present

## 2017-10-28 DIAGNOSIS — R05 Cough: Secondary | ICD-10-CM | POA: Diagnosis not present

## 2017-10-28 DIAGNOSIS — I6523 Occlusion and stenosis of bilateral carotid arteries: Secondary | ICD-10-CM | POA: Diagnosis not present

## 2017-10-28 DIAGNOSIS — J9601 Acute respiratory failure with hypoxia: Secondary | ICD-10-CM | POA: Diagnosis not present

## 2017-10-28 DIAGNOSIS — W19XXXA Unspecified fall, initial encounter: Secondary | ICD-10-CM | POA: Diagnosis present

## 2017-10-28 DIAGNOSIS — B351 Tinea unguium: Secondary | ICD-10-CM | POA: Diagnosis not present

## 2017-10-28 DIAGNOSIS — N189 Chronic kidney disease, unspecified: Secondary | ICD-10-CM | POA: Diagnosis not present

## 2017-10-28 DIAGNOSIS — R41841 Cognitive communication deficit: Secondary | ICD-10-CM | POA: Diagnosis not present

## 2017-10-28 DIAGNOSIS — L539 Erythematous condition, unspecified: Secondary | ICD-10-CM | POA: Diagnosis not present

## 2017-10-28 DIAGNOSIS — R195 Other fecal abnormalities: Secondary | ICD-10-CM | POA: Diagnosis not present

## 2017-10-28 DIAGNOSIS — M6281 Muscle weakness (generalized): Secondary | ICD-10-CM | POA: Diagnosis not present

## 2017-10-28 DIAGNOSIS — I248 Other forms of acute ischemic heart disease: Secondary | ICD-10-CM | POA: Diagnosis present

## 2017-10-28 DIAGNOSIS — A419 Sepsis, unspecified organism: Secondary | ICD-10-CM | POA: Diagnosis not present

## 2017-10-28 DIAGNOSIS — R509 Fever, unspecified: Secondary | ICD-10-CM | POA: Diagnosis not present

## 2017-10-28 DIAGNOSIS — I25810 Atherosclerosis of coronary artery bypass graft(s) without angina pectoris: Secondary | ICD-10-CM | POA: Diagnosis not present

## 2017-10-28 DIAGNOSIS — E11621 Type 2 diabetes mellitus with foot ulcer: Secondary | ICD-10-CM | POA: Diagnosis not present

## 2017-10-28 DIAGNOSIS — I4891 Unspecified atrial fibrillation: Secondary | ICD-10-CM | POA: Diagnosis not present

## 2017-10-28 DIAGNOSIS — I5032 Chronic diastolic (congestive) heart failure: Secondary | ICD-10-CM | POA: Diagnosis not present

## 2017-10-28 DIAGNOSIS — L03115 Cellulitis of right lower limb: Secondary | ICD-10-CM | POA: Diagnosis not present

## 2017-10-28 DIAGNOSIS — S32009A Unspecified fracture of unspecified lumbar vertebra, initial encounter for closed fracture: Secondary | ICD-10-CM | POA: Diagnosis present

## 2017-10-28 DIAGNOSIS — L853 Xerosis cutis: Secondary | ICD-10-CM | POA: Diagnosis not present

## 2017-10-28 DIAGNOSIS — Z0289 Encounter for other administrative examinations: Secondary | ICD-10-CM

## 2017-10-28 DIAGNOSIS — R579 Shock, unspecified: Secondary | ICD-10-CM | POA: Diagnosis present

## 2017-10-28 DIAGNOSIS — G629 Polyneuropathy, unspecified: Secondary | ICD-10-CM | POA: Diagnosis present

## 2017-10-28 DIAGNOSIS — R7989 Other specified abnormal findings of blood chemistry: Secondary | ICD-10-CM | POA: Diagnosis not present

## 2017-10-28 DIAGNOSIS — R7881 Bacteremia: Secondary | ICD-10-CM | POA: Diagnosis not present

## 2017-10-28 DIAGNOSIS — R0602 Shortness of breath: Secondary | ICD-10-CM | POA: Diagnosis not present

## 2017-10-28 DIAGNOSIS — E1122 Type 2 diabetes mellitus with diabetic chronic kidney disease: Secondary | ICD-10-CM | POA: Diagnosis not present

## 2017-10-28 DIAGNOSIS — A408 Other streptococcal sepsis: Secondary | ICD-10-CM | POA: Diagnosis not present

## 2017-10-28 DIAGNOSIS — L0291 Cutaneous abscess, unspecified: Secondary | ICD-10-CM | POA: Diagnosis not present

## 2017-10-28 DIAGNOSIS — I2510 Atherosclerotic heart disease of native coronary artery without angina pectoris: Secondary | ICD-10-CM | POA: Diagnosis present

## 2017-10-28 DIAGNOSIS — I83029 Varicose veins of left lower extremity with ulcer of unspecified site: Secondary | ICD-10-CM | POA: Diagnosis not present

## 2017-10-28 DIAGNOSIS — E1159 Type 2 diabetes mellitus with other circulatory complications: Secondary | ICD-10-CM | POA: Diagnosis not present

## 2017-10-28 DIAGNOSIS — I361 Nonrheumatic tricuspid (valve) insufficiency: Secondary | ICD-10-CM | POA: Diagnosis not present

## 2017-10-28 DIAGNOSIS — I482 Chronic atrial fibrillation: Secondary | ICD-10-CM | POA: Diagnosis not present

## 2017-10-28 DIAGNOSIS — M5489 Other dorsalgia: Secondary | ICD-10-CM | POA: Diagnosis not present

## 2017-10-28 DIAGNOSIS — E46 Unspecified protein-calorie malnutrition: Secondary | ICD-10-CM | POA: Diagnosis present

## 2017-10-28 DIAGNOSIS — I10 Essential (primary) hypertension: Secondary | ICD-10-CM | POA: Diagnosis not present

## 2017-10-28 DIAGNOSIS — I130 Hypertensive heart and chronic kidney disease with heart failure and stage 1 through stage 4 chronic kidney disease, or unspecified chronic kidney disease: Secondary | ICD-10-CM | POA: Diagnosis not present

## 2017-10-28 DIAGNOSIS — Z7189 Other specified counseling: Secondary | ICD-10-CM | POA: Diagnosis not present

## 2017-10-28 DIAGNOSIS — L97929 Non-pressure chronic ulcer of unspecified part of left lower leg with unspecified severity: Secondary | ICD-10-CM | POA: Diagnosis not present

## 2017-10-28 DIAGNOSIS — R52 Pain, unspecified: Secondary | ICD-10-CM | POA: Diagnosis not present

## 2017-10-28 DIAGNOSIS — I480 Paroxysmal atrial fibrillation: Secondary | ICD-10-CM | POA: Diagnosis not present

## 2017-10-28 DIAGNOSIS — R531 Weakness: Secondary | ICD-10-CM | POA: Diagnosis not present

## 2017-10-28 DIAGNOSIS — R29898 Other symptoms and signs involving the musculoskeletal system: Secondary | ICD-10-CM | POA: Diagnosis not present

## 2017-10-28 DIAGNOSIS — E11622 Type 2 diabetes mellitus with other skin ulcer: Secondary | ICD-10-CM | POA: Diagnosis present

## 2017-10-28 DIAGNOSIS — A400 Sepsis due to streptococcus, group A: Secondary | ICD-10-CM | POA: Diagnosis not present

## 2017-10-28 DIAGNOSIS — I872 Venous insufficiency (chronic) (peripheral): Secondary | ICD-10-CM | POA: Diagnosis present

## 2017-10-28 DIAGNOSIS — D72829 Elevated white blood cell count, unspecified: Secondary | ICD-10-CM | POA: Diagnosis not present

## 2017-10-28 DIAGNOSIS — R6520 Severe sepsis without septic shock: Secondary | ICD-10-CM | POA: Diagnosis not present

## 2017-10-28 LAB — BASIC METABOLIC PANEL
Anion gap: 12 (ref 5–15)
BUN: 43 mg/dL — ABNORMAL HIGH (ref 6–20)
CALCIUM: 9.1 mg/dL (ref 8.9–10.3)
CO2: 30 mmol/L (ref 22–32)
CREATININE: 1.1 mg/dL — AB (ref 0.44–1.00)
Chloride: 96 mmol/L — ABNORMAL LOW (ref 101–111)
GFR, EST AFRICAN AMERICAN: 53 mL/min — AB (ref >60)
GFR, EST NON AFRICAN AMERICAN: 45 mL/min — AB (ref >60)
Glucose, Bld: 100 mg/dL — ABNORMAL HIGH (ref 65–99)
Potassium: 3.6 mmol/L (ref 3.5–5.1)
SODIUM: 138 mmol/L (ref 135–145)

## 2017-10-28 LAB — PROTIME-INR
INR: 2.12
PROTHROMBIN TIME: 23.5 s — AB (ref 11.4–15.2)

## 2017-10-28 LAB — MAGNESIUM: MAGNESIUM: 1.5 mg/dL — AB (ref 1.7–2.4)

## 2017-10-28 LAB — GLUCOSE, CAPILLARY
GLUCOSE-CAPILLARY: 90 mg/dL (ref 65–99)
Glucose-Capillary: 127 mg/dL — ABNORMAL HIGH (ref 65–99)

## 2017-10-28 MED ORDER — INSULIN ASPART 100 UNIT/ML ~~LOC~~ SOLN: SUBCUTANEOUS | 0 refills | Status: DC

## 2017-10-28 MED ORDER — FUROSEMIDE 80 MG PO TABS: 80.0000 mg | ORAL_TABLET | Freq: Two times a day (BID) | ORAL | Status: DC

## 2017-10-28 MED ORDER — POTASSIUM CHLORIDE CRYS ER 20 MEQ PO TBCR
40.0000 meq | EXTENDED_RELEASE_TABLET | Freq: Four times a day (QID) | ORAL | Status: AC
  Administered 2017-10-28 (×2): 40 meq via ORAL
  Filled 2017-10-28 (×2): qty 2

## 2017-10-28 MED ORDER — MAGNESIUM SULFATE 2 GM/50ML IV SOLN
2.0000 g | Freq: Once | INTRAVENOUS | Status: AC
  Administered 2017-10-28: 2 g via INTRAVENOUS
  Filled 2017-10-28: qty 50

## 2017-10-28 MED ORDER — METOLAZONE 2.5 MG PO TABS: 2.5000 mg | ORAL_TABLET | Freq: Every day | ORAL | 1 refills | Status: DC

## 2017-10-28 MED ORDER — METOLAZONE 2.5 MG PO TABS
2.5000 mg | ORAL_TABLET | Freq: Once | ORAL | Status: AC
  Administered 2017-10-28: 2.5 mg via ORAL
  Filled 2017-10-28: qty 1

## 2017-10-28 MED ORDER — POTASSIUM CHLORIDE ER 20 MEQ PO TBCR: 20.0000 meq | EXTENDED_RELEASE_TABLET | Freq: Two times a day (BID) | ORAL | Status: AC

## 2017-10-28 NOTE — Discharge Summary (Signed)
[name] LGX:[id number] DOB: February 05, 1933 DOA: 10/16/2017  PCP: [name], MD  Admit date: 10/16/2017  Discharge date: 10/28/2017  Admitted From: ALF   Disposition:  SNF   Recommendations for Outpatient Follow-up:   Follow up with PCP in 1-2 weeks  PCP Please obtain BMP/CBC, 2 view CXR in 1week,  (see Discharge instructions)   PCP Please follow up on the following pending results: Monitor weight, BMP and diuretic dose closely.  Note patient is on high-dose Lasix along with Zaroxolyn.  This needs to be monitored closely.   Home Health: none Equipment/Devices: none  Consultations: none Discharge Condition: fair   CODE STATUS: Full Diet Recommendation: Diet Carb Modified Heart Healthy strict 1.5 L total fluid restriction every 24 hours   No chief complaint on file.    Brief history of present illness from the day of admission and additional interim summary     [name] is a 83 y.o. female with a history of CAD s/p CABG, chronic HFpEF, T2DM, AFib on coumadin who presented for right thigh pain and redness for several days and was admitted with cellulitis. Hospital course has been prolonged due to slow recovery.                                                                    Hospital Course    Right thigh cellulitis: I suspect she had chronic edema induced to redness more than infection, lower extremity venous duplex unremarkable, was on empiric antibiotics for 9 days which included combination of vancomycin and Rocephin, currently on p.o. Keflex, redness improving remarkably with ongoing diuresis which will be continued.  Advance activity likely discharge in 1-2 days to SNF.  Severe Bilateral lower extremity edema: Suspect combination venous stasis and acute on chronic diastolic CHF  recent EF 81%.  She has massive edema and fluid overload, she has been aggressively diuresed with 80 mg of IV Lasix twice a day along with scheduled Zaroxolyn daily, she still has evidence of significant fluid overload but her lungs are clear, at this time I have adjusted her oral Lasix dose and added daily Zaroxolyn for 5 days, thereafter Zaroxolyn should be twice a week, request SNF MD to monitor BMP, diuretic dose and weight closely.  She is at least 15-20 pounds heavier than her dry weight still.   Sacrum fracture: Incidental finding on CT right femur 10/24. Pt reports having fallen on her bottom last month with some pain but this has improved and is not overly bothersome any more.  Will need SNF  CKD stage III: Stable.  Creatinine actually improving with diuresis which will be continued.  Well-controlled T2DM: HbA1c 5.5%, continue metformin have added sliding scale q. before meals at bedtime.  CBG (last 3)   Recent Labs  10/27/17 1952 10/28/17 0724 10/28/17 1146  GLUCAP 145* 90 127*   Chronic atrial fibrillation: Rate controlled,  Continue coumadin per pharmacy. [place] vasc 2 score of at least 3.  CAD s/p CABG and DES: No chest pain.  Continue home medications  Gout: No flare.  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)   Atrial fibrillation (HCC)   Depression   GERD (gastroesophageal reflux disease)   Pulmonary HTN (HCC)   Cellulitis of right leg   Hyponatremia   Hypomagnesemia   Paroxysmal A-fib ([place])   Cellulitis    Discharge instructions    Discharge Instructions    Diet - low sodium heart healthy    Complete by:  As directed    Discharge instructions    Complete by:  As directed    Follow with Primary MD [name], MD in 7 days   Get CBC, INR, Magnesium levels, BMP, 2 view Chest X ray checked  by SNF MD  in 2 days  Activity: As tolerated with Full fall precautions use walker/cane & assistance as needed  Disposition SNF  Diet:   Diet Carb Modified Heart Healthy  1.5 lit/day fluid restriction.  On your next visit with your primary care physician please Get Medicines reviewed and adjusted.  Please request your Prim.MD to go over all Hospital Tests and Procedure/Radiological results at the follow up, please get all Hospital records sent to your Prim MD by signing hospital release before you go home.  If you experience worsening of your admission symptoms, develop shortness of breath, life threatening emergency, suicidal or homicidal thoughts you must seek medical attention immediately by calling 911 or calling your MD immediately  if symptoms less severe.  You Must read complete instructions/literature along with all the possible adverse reactions/side effects for all the Medicines you take and that have been prescribed to you. Take any new Medicines after you have completely understood and accpet all the possible adverse reactions/side effects.   Do not drive, operate heavy machinery, perform activities at heights, swimming or participation in water activities or provide baby sitting services if your were admitted for syncope or siezures until you have seen by Primary MD or a Neurologist and advised to do so again.  Do not drive when taking Pain medications.    Do not take more than prescribed Pain, Sleep and Anxiety Medications  Special Instructions: If you have smoked or chewed Tobacco  in the last 2 yrs please stop smoking, stop any regular Alcohol  and or any Recreational drug use.  Wear Seat belts while driving.   Please note  You were cared for by a hospitalist during your hospital stay. If you have any questions about your discharge medications or the care you received while you were in the hospital after you are discharged, you can call the unit and asked to speak with the  hospitalist on call if the hospitalist that took care of you is not available. Once you are discharged, your primary care physician will handle any further medical issues. Please note that NO REFILLS for any discharge medications will be authorized once you are discharged, as it is imperative that you return to your primary care physician (or establish a relationship with a primary care physician if you do not have one) for your aftercare needs so that they can reassess your  need for medications and monitor your lab values.   Increase activity slowly    Complete by:  As directed       Discharge Medications   Allergies as of 10/28/2017      Reactions   Ace Inhibitors Other (See Comments)   Intolerance per Dr. Doug Sou note   Amlodipine Other (See Comments)   Intolerance per Dr. Doug Sou note   Statins Other (See Comments)   Muscle soreness   Sulfa Drugs Cross Reactors Itching   Zetia [ezetimibe] Other (See Comments)   Muscle soreness   Fenofibrate Other (See Comments)   Muscle soreness      Medication List    TAKE these medications   clobetasol cream 0.05 % Commonly known as:  TEMOVATE APPLY NIGHTLY AS NEEDED FOR IRRITATION. 7 days in a row maximum   colchicine 0.6 MG tablet Take Full tablet for 1 week then 1/2 tablet daily for 3 weeks. What changed:  how much to take  how to take this  when to take this  additional instructions   febuxostat 40 MG tablet Commonly known as:  ULORIC Take 1 tablet (40 mg total) by mouth daily.   furosemide 80 MG tablet Commonly known as:  LASIX Take 1 tablet (80 mg total) by mouth 2 (two) times daily. Take 80 mg PO BID What changed:  medication strength  how much to take  how to take this  when to take this  additional instructions   insulin aspart 100 UNIT/ML injection Commonly known as:  NOVOLOG Before each meal 3 times a day, 140-199 - 2 units, 200-250 - 4 units, 251-299 - 6 units,  300-349 - 8 units,  350 or above 10  units. Insulin PEN if approved, provide syringes and needles if needed.   loratadine 10 MG tablet Commonly known as:  CLARITIN Take 10 mg by mouth daily as needed for allergies.   metFORMIN 500 MG tablet Commonly known as:  GLUCOPHAGE TAKE ONE TABLET BY MOUTH EVERY MORNING WITH BREAKFAST What changed:  See the new instructions.   metolazone 2.5 MG tablet Commonly known as:  ZAROXOLYN Take 1 tablet (2.5 mg total) by mouth daily. For 5 days then once every 3 days What changed:  how much to take  how to take this  when to take this  additional instructions   metoprolol succinate 25 MG 24 hr tablet Commonly known as:  TOPROL-XL Take 0.5 tablets (12.5 mg total) by mouth daily.   nitroGLYCERIN 0.4 MG SL tablet Commonly known as:  NITROSTAT Place 1 tablet (0.4 mg total) under the tongue every 5 (five) minutes as needed. For chest pain   omeprazole 20 MG capsule Commonly known as:  PRILOSEC Take 1 capsule (20 mg total) by mouth daily.   Potassium Chloride ER 20 MEQ Tbcr Take 20 mEq by mouth 2 (two) times daily. What changed:  medication strength  when to take this   warfarin 5 MG tablet Commonly known as:  COUMADIN TAKE 1/2 TO 1 TABLET DAILY AS DIRECTED BY COUMADIN CLINIC. What changed:  how to take this  when to take this  additional instructions        Contact information for follow-up providers    [place] [place] Follow up.   Specialty:  Home Health Services Contact information: [place]  [address] [phone]        [name], MD. Schedule an appointment as soon as possible for a visit in  1 week(s).   Specialty:  Family Medicine Contact information: [place] [address] [phone]            Contact information for after-discharge care    Destination    [facility] Follow up.   Specialty:  [place] information: [phone] [address]  [place] [phone]                  Major procedures and Radiology Reports - PLEASE review detailed and final reports thoroughly  -       Dg Chest 2 View  Result Date: 10/16/2017 CLINICAL DATA:  Leg swelling history of CHF EXAM: CHEST  2 VIEW COMPARISON:  09/18/2017 FINDINGS: Post sternotomy changes. Stable cardiomegaly. Linear scarring or atelectasis at the mid lung. Stable posterior pleural thickening or small effusion. Aortic atherosclerosis. No pneumothorax. Suspected skin fold artifact over the right thorax. IMPRESSION: 1. Cardiomegaly with minimal central congestion. Stable appearance of mild posterior pleural thickening or small effusion. Electronically Signed   By: Donavan Foil M.D.   On: 10/16/2017 18:39   Ct Femur Right Wo Contrast  Result Date: 10/23/2017 CLINICAL DATA:  Right thigh erythema and pain. Chronic bilateral leg ulcers. EXAM: CT OF THE LOWER RIGHT EXTREMITY WITHOUT CONTRAST TECHNIQUE: Multidetector CT imaging of the right lower extremity was performed according to the standard protocol. COMPARISON:  Right hip MRI from 05/15/2005 FINDINGS: Bones/Joint/Cartilage The top-most images demonstrate an acute pelvic fracture involving the lower sacrum. No bony destructive findings characteristic of osteomyelitis. Prominent osteoarthritis of the knee the food with probable heterotopic ossification or chronic calcified fat necrosis anterolateral to the distal femur. Small ossification lateral to the lateral meniscus on image 361/3. Severe medial compartmental articular space narrowing. Mild degenerative arthropathy of the hip on the right. Mild spurring of the pubic symphysis. Ligaments Suboptimally assessed by CT. Muscles and Tendons There is some low-level edema tracking along deep fascia planes in the thigh in a diffuse fashion, but less striking than the subcutaneous edema. Soft tissues Diffuse subcutaneous edema along the lower  abdomen, right buttock and hip region, and tracking in the right thigh down into the visualized upper calf. Include a small part of the left thigh and it also shows diffuse subcutaneous edema in the visualized portion. Extensive pelvic ascites. Right inguinal lymph nodes are present measuring up to about 1.2 cm in diameter. Venous varicosities present posteromedially. Atherosclerotic calcification is noted. I do not observe a drainable abscess. IMPRESSION: 1. The patient appears to have an acute fracture the lower sacrum. This is only partially included on the very top most axial images. Please note that much of the bony pelvis was not included on today's CT exam of the femur. 2. Extensive third spacing of fluid, with diffuse subcutaneous edema, extensive pelvic ascites, and low-level edema tracking along the deep fascia planes of the thigh, cause uncertain. No hip or knee effusion to suggest septic joint. No gas in the soft tissues or drainable abscess identified. 3. Severe osteoarthritis of the right knee. 4. Venous varicosities posteromedially in the right thigh. Electronically Signed   By: Van Clines M.D.   On: 10/23/2017 16:25   [place] Rt Lower Extrem Ltd Soft Tissue Non Vascular  Result Date: 10/23/2017 CLINICAL DATA:  Soft tissue prominence medial right thigh EXAM: ULTRASOUND RIGHT MEDIAL THIGH LIMITED TECHNIQUE: Ultrasound examination of the lower extremity soft tissues was performed in the area of clinical concern in longitudinal and transverse projection.  COMPARISON:  None. FINDINGS: There is subcutaneous thickening consistent with cellulitis. No fluid collection or abscess evident. No evident mass or adenopathy. IMPRESSION: Evidence of a degree of cellulitis. No frank abscess or mass evident by ultrasound in the medial right thigh region. Electronically Signed   By: Lowella Grip III M.D.   On: 10/23/2017 12:17    Micro Results     No results found for this or any previous visit (from  the past 240 hour(s)).  Today   Subjective    Diania Co today has no headache,no chest abdominal pain,no new weakness tingling or numbness, feels much better wants to go to SNF today.     Objective   Blood pressure 109/84, pulse 82, temperature 98 F (36.7 C), temperature source Oral, resp. rate 18, height 5\' 8"  (1.727 m), weight 88.9 kg (196 lb), SpO2 95 %.   Intake/Output Summary (Last 24 hours) at 10/28/17 1145 Last data filed at 10/28/17 0657  Gross per 24 hour  Intake             1203 ml  Output             1450 ml  Net             -247 ml    Exam Awake Alert, Oriented x 3, No new F.N deficits, Normal affect [place].AT,PERRAL Supple Neck,No JVD, No cervical lymphadenopathy appriciated.  Symmetrical Chest wall movement, Good air movement bilaterally, CTAB RRR,No Gallops,Rubs or new Murmurs, No Parasternal Heave +ve B.Sounds, Abd Soft, Non tender, No organomegaly appriciated, No rebound -guarding or rigidity. No Cyanosis, Clubbing , 2-3+ leg  edema, No new Rash , multiple old bruises   Data Review   CBC w Diff: Lab Results  Component Value Date   WBC 9.5 10/26/2017   HGB 11.5 (L) 10/26/2017   HCT 36.5 10/26/2017   PLT 310 10/26/2017   LYMPHOPCT 3.9 (L) 09/30/2017   MONOPCT 7.1 09/30/2017   EOSPCT 0.1 09/30/2017   BASOPCT 0.7 09/30/2017    CMP: Lab Results  Component Value Date   NA 138 10/28/2017   NA 137 09/12/2017   K 3.6 10/28/2017   CL 96 (L) 10/28/2017   CO2 30 10/28/2017   BUN 43 (H) 10/28/2017   BUN 38 (H) 09/12/2017   CREATININE 1.10 (H) 10/28/2017   CREATININE 0.86 04/15/2017   PROT 7.3 10/26/2017   ALBUMIN 3.3 (L) 10/26/2017   BILITOT 1.2 10/26/2017   ALKPHOS 112 10/26/2017   AST 20 10/26/2017   ALT 12 (L) 10/26/2017  . Lab Results  Component Value Date   INR 2.12 10/28/2017   INR 2.33 10/27/2017   INR 2.59 10/26/2017     Total Time in preparing paper work, data evaluation and todays exam - 10 minutes  Lala Lund M.D on  10/28/2017 at 11:45 AM  [facility]   Office  [phone]

## 2017-10-28 NOTE — Clinical Social Work Placement (Signed)
Pt discharging today and will admit to [name] for SNF (lives in independent living community there but requiring skilled PT and wound care prior to returning home) All information provided to facility via the [place]  Completed medical necessity form and will arrange [facility] transport.  Pt's daughter Prentiss Bells on her way to hospital- aware of plan and wants to bring some clothing for pt to wear for transport. CSW will call for transport once daughter arrives.  See below for placement details.   CLINICAL SOCIAL WORK PLACEMENT  NOTE  Date:  10/28/2017  Patient Details  Name: [name] MRN: [id number] Date of Birth: 1933/01/27  Clinical Social Work is seeking post-discharge placement for this patient at the [place] level of care (*CSW will initial, date and re-position this form in  chart as items are completed):  Yes   Patient/family provided with [place] Work Department's list of facilities offering this level of care within the geographic area requested by the patient (or if unable, by the patient's family).  Yes   Patient/family informed of their freedom to choose among providers that offer the needed level of care, that participate in Medicare, Medicaid or managed care program needed by the patient, have an available bed and are willing to accept the patient.  Yes   Patient/family informed of [facility]'s ownership interest in [facility] and [facility], as well as of the fact that they are under no obligation to receive care at these facilities.  PASRR submitted to EDS on       PASRR number received on       Existing PASRR number confirmed on 10/23/17     FL2 transmitted to all facilities in geographic area requested by pt/family on 10/23/17     FL2 transmitted to all facilities within larger geographic area on       Patient informed that his/her managed care company has contracts with or will negotiate  with certain facilities, including the following:        Yes   Patient/family informed of bed offers received.  Patient chooses bed at [facility]     Physician recommends and patient chooses bed at [facility]    Patient to be transferred to [facility] on 10/28/17.  Patient to be transferred to facility by       Patient family notified on 10/28/17 of transfer.  Name of family member notified:  daughter [facility]     PHYSICIAN       Additional Comment:    _______________________________________________ Nila Nephew, LCSW 10/28/2017, 12:49 PM

## 2017-10-28 NOTE — Progress Notes (Signed)
[place] for warfarin Indication: atrial fibrillation  Allergies  Allergen Reactions  . Ace Inhibitors Other (See Comments)    Intolerance per Dr. Doug Sou note  . Amlodipine Other (See Comments)    Intolerance per Dr. Doug Sou note   . Statins Other (See Comments)    Muscle soreness  . Sulfa Drugs Cross Reactors Itching  . Zetia [Ezetimibe] Other (See Comments)    Muscle soreness  . Fenofibrate Other (See Comments)    Muscle soreness   Vital Signs: Temp: 98 F (36.7 C) (10/29 0500) Temp Source: Oral (10/29 0500) BP: 109/84 (10/29 0500) Pulse Rate: 82 (10/29 0500)  Labs:  Recent Labs  10/26/17 0358 10/26/17 0815 10/27/17 0427 10/27/17 0851 10/28/17 0417  HGB  --  11.5*  --   --   --   HCT  --  36.5  --   --   --   PLT  --  310  --   --   --   LABPROT 27.5*  --  25.4*  --  23.5*  INR 2.59  --  2.33  --  2.12  CREATININE 1.24*  --   --  1.20* 1.10*   Estimated Creatinine Clearance: 46 mL/min (A) (by C-G formula based on SCr of 1.1 mg/dL (H)).  Medications:  Scheduled:  . bisacodyl  10 mg Rectal Daily  . cephALEXin  500 mg Oral Q12H  . colchicine  0.3 mg Oral Daily  . febuxostat  40 mg Oral Daily  . feeding supplement (GLUCERNA SHAKE)  237 mL Oral TID BM  . furosemide  60 mg Intravenous Q12H  . insulin aspart  0-15 Units Subcutaneous TID WC  . insulin aspart  0-5 Units Subcutaneous QHS  . multivitamin with minerals  1 tablet Oral Daily  . pantoprazole  40 mg Oral Daily  . potassium chloride  40 mEq Oral Q6H  . sodium chloride flush  3 mL Intravenous Q12H  . Warfarin - Pharmacist Dosing Inpatient   Does not apply q1800   Infusions:  . sodium chloride 1,000 mL (10/19/17 0533)   Assessment: 84 yoF admitted on 10/17 with c/o right thigh and leg redness and pain on chronic warfarin for A-fib.  Home Warfarin: 5 mg on Sun/Tu/Thu/Sat, and 2.5 mg on M/W/F.  INR=3.38 on admission.  Today, 10/28/2017  INR therapeutic but  decreasing  Diet: Eating well  CBC: Hg slightly low, but stable (last on 10/27)  No bleeding reported  DDI: recent road spectrum antibiotics   Goal of Therapy:  INR 2-3   Plan:   Agree with resuming home dose of warfarin at discharge   Reuel Boom, PharmD, BCPS Pager: [phone] 10/28/2017, 12:01 PM

## 2017-10-28 NOTE — Discharge Instructions (Signed)
Follow with Primary MD [name], MD in 7 days   Get CBC, INR, Magnesium levels, BMP, 2 view Chest X ray checked  by SNF MD in 2 days  Activity: As tolerated with Full fall precautions use walker/cane & assistance as needed  Disposition SNF  Diet:   Diet Carb Modified Heart Healthy  1.5 lit/day fluid restriction.  On your next visit with your primary care physician please Get Medicines reviewed and adjusted.  Please request your Prim.MD to go over all Hospital Tests and Procedure/Radiological results at the follow up, please get all Hospital records sent to your Prim MD by signing hospital release before you go home.  If you experience worsening of your admission symptoms, develop shortness of breath, life threatening emergency, suicidal or homicidal thoughts you must seek medical attention immediately by calling 911 or calling your MD immediately  if symptoms less severe.  You Must read complete instructions/literature along with all the possible adverse reactions/side effects for all the Medicines you take and that have been prescribed to you. Take any new Medicines after you have completely understood and accpet all the possible adverse reactions/side effects.   Do not drive, operate heavy machinery, perform activities at heights, swimming or participation in water activities or provide baby sitting services if your were admitted for syncope or siezures until you have seen by Primary MD or a Neurologist and advised to do so again.  Do not drive when taking Pain medications.    Do not take more than prescribed Pain, Sleep and Anxiety Medications  Special Instructions: If you have smoked or chewed Tobacco  in the last 2 yrs please stop smoking, stop any regular Alcohol  and or any Recreational drug use.  Wear Seat belts while driving.   Please note  You were cared for by a hospitalist during your hospital stay. If you have any questions about your discharge medications or the  care you received while you were in the hospital after you are discharged, you can call the unit and asked to speak with the hospitalist on call if the hospitalist that took care of you is not available. Once you are discharged, your primary care physician will handle any further medical issues. Please note that NO REFILLS for any discharge medications will be authorized once you are discharged, as it is imperative that you return to your primary care physician (or establish a relationship with a primary care physician if you do not have one) for your aftercare needs so that they can reassess your need for medications and monitor your lab values.

## 2017-10-28 NOTE — Progress Notes (Signed)
Pt d/c'd via [facility] to [facility]. Discharge packet sent with ambulance. Daughter will meet pt at facility.

## 2017-10-29 ENCOUNTER — Telehealth: Payer: Self-pay | Admitting: *Deleted

## 2017-10-29 ENCOUNTER — Encounter: Payer: Self-pay | Admitting: Family

## 2017-10-29 ENCOUNTER — Non-Acute Institutional Stay (SKILLED_NURSING_FACILITY): Payer: Medicare Other | Admitting: Family

## 2017-10-29 DIAGNOSIS — L853 Xerosis cutis: Secondary | ICD-10-CM | POA: Diagnosis not present

## 2017-10-29 DIAGNOSIS — R609 Edema, unspecified: Secondary | ICD-10-CM | POA: Diagnosis not present

## 2017-10-29 DIAGNOSIS — L97929 Non-pressure chronic ulcer of unspecified part of left lower leg with unspecified severity: Secondary | ICD-10-CM

## 2017-10-29 DIAGNOSIS — M79605 Pain in left leg: Secondary | ICD-10-CM

## 2017-10-29 DIAGNOSIS — I83029 Varicose veins of left lower extremity with ulcer of unspecified site: Secondary | ICD-10-CM

## 2017-10-29 DIAGNOSIS — L84 Corns and callosities: Secondary | ICD-10-CM
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
## 2017-10-29 NOTE — Telephone Encounter (Signed)
Attempted to call pt to schedule hospital follow up and complete TCM call. No answer and no voicemail set up.

## 2017-10-30 ENCOUNTER — Encounter: Payer: Self-pay | Admitting: Internal Medicine

## 2017-10-30 ENCOUNTER — Non-Acute Institutional Stay (SKILLED_NURSING_FACILITY): Payer: Medicare Other | Admitting: Internal Medicine

## 2017-10-30 DIAGNOSIS — E1122 Type 2 diabetes mellitus with diabetic chronic kidney disease: Secondary | ICD-10-CM

## 2017-10-30 DIAGNOSIS — L03115 Cellulitis of right lower limb: Secondary | ICD-10-CM

## 2017-10-30 DIAGNOSIS — R531 Weakness: Secondary | ICD-10-CM | POA: Diagnosis not present

## 2017-10-30 DIAGNOSIS — L97929 Non-pressure chronic ulcer of unspecified part of left lower leg with unspecified severity: Secondary | ICD-10-CM

## 2017-10-30 DIAGNOSIS — M1A9XX0 Chronic gout, unspecified, without tophus (tophi): Secondary | ICD-10-CM

## 2017-10-30 DIAGNOSIS — R05 Cough: Secondary | ICD-10-CM

## 2017-10-30 DIAGNOSIS — J309 Allergic rhinitis, unspecified: Secondary | ICD-10-CM

## 2017-10-30 DIAGNOSIS — I25810 Atherosclerosis of coronary artery bypass graft(s) without angina pectoris: Secondary | ICD-10-CM | POA: Diagnosis not present

## 2017-10-30 DIAGNOSIS — I6523 Occlusion and stenosis of bilateral carotid arteries: Secondary | ICD-10-CM

## 2017-10-30 DIAGNOSIS — L03116 Cellulitis of left lower limb: Secondary | ICD-10-CM | POA: Diagnosis not present

## 2017-10-30 DIAGNOSIS — I480 Paroxysmal atrial fibrillation: Secondary | ICD-10-CM | POA: Diagnosis not present

## 2017-10-30 DIAGNOSIS — N183 Chronic kidney disease, stage 3 unspecified: Secondary | ICD-10-CM

## 2017-10-30 DIAGNOSIS — R059 Cough, unspecified: Secondary | ICD-10-CM

## 2017-10-30 DIAGNOSIS — K219 Gastro-esophageal reflux disease without esophagitis: Secondary | ICD-10-CM

## 2017-10-30 DIAGNOSIS — I5032 Chronic diastolic (congestive) heart failure: Secondary | ICD-10-CM | POA: Diagnosis not present

## 2017-10-30 NOTE — Telephone Encounter (Signed)
Attempted to call pt again. No answer and unable to leave VM.
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
## 2017-10-31 ENCOUNTER — Encounter: Payer: Self-pay | Admitting: *Deleted

## 2017-10-31 LAB — HEPATIC FUNCTION PANEL
ALT: 11 (ref 7–35)
AST: 20 (ref 13–35)
Alkaline Phosphatase: 104 (ref 25–125)
BILIRUBIN, TOTAL: 1.2

## 2017-10-31 LAB — BASIC METABOLIC PANEL
BUN: 43 — AB (ref 4–21)
Creatinine: 1.3 — AB (ref 0.5–1.1)
GLUCOSE: 89
Potassium: 4.4 (ref 3.4–5.3)
Sodium: 136 — AB (ref 137–147)

## 2017-10-31 LAB — CBC AND DIFFERENTIAL
HCT: 34 — AB (ref 36–46)
HEMOGLOBIN: 11 — AB (ref 12.0–16.0)
Neutrophils Absolute: 7013
Platelets: 346 (ref 150–399)
WBC: 8.9

## 2017-10-31 LAB — MAGNESIUM: MAGNESIUM: 1.4

## 2017-11-05 ENCOUNTER — Telehealth: Payer: Self-pay

## 2017-11-05 NOTE — Telephone Encounter (Signed)
PA for Uloric received and initiated through Cover My Meds.  Waiting for insurance decision.

## 2017-11-07 NOTE — Telephone Encounter (Signed)
PA for Uloric approved.  Approval notice faxed to patient's pharmacy.

## 2017-11-11 ENCOUNTER — Ambulatory Visit: Payer: Medicare Other | Admitting: Physician Assistant
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
## 2017-11-13 ENCOUNTER — Encounter: Payer: Self-pay | Admitting: Internal Medicine

## 2017-11-13 ENCOUNTER — Non-Acute Institutional Stay (SKILLED_NURSING_FACILITY): Payer: Medicare Other | Admitting: Family

## 2017-11-13 ENCOUNTER — Encounter: Payer: Self-pay | Admitting: Family

## 2017-11-13 DIAGNOSIS — I83029 Varicose veins of left lower extremity with ulcer of unspecified site: Secondary | ICD-10-CM | POA: Diagnosis not present

## 2017-11-13 DIAGNOSIS — L97929 Non-pressure chronic ulcer of unspecified part of left lower leg with unspecified severity: Secondary | ICD-10-CM

## 2017-11-13 DIAGNOSIS — I5032 Chronic diastolic (congestive) heart failure: Secondary | ICD-10-CM | POA: Diagnosis not present

## 2017-11-13 NOTE — Patient Instructions (Signed)
Opened in error
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
## 2017-11-13 NOTE — Progress Notes (Signed)
Opened in error

## 2017-11-14 ENCOUNTER — Ambulatory Visit: Payer: Medicare Other | Admitting: Podiatry

## 2017-11-27 ENCOUNTER — Non-Acute Institutional Stay (SKILLED_NURSING_FACILITY): Payer: Medicare Other | Admitting: Family

## 2017-11-27 ENCOUNTER — Encounter: Payer: Self-pay | Admitting: Family

## 2017-11-27 DIAGNOSIS — E1122 Type 2 diabetes mellitus with diabetic chronic kidney disease: Secondary | ICD-10-CM | POA: Diagnosis not present

## 2017-11-27 DIAGNOSIS — N183 Chronic kidney disease, stage 3 unspecified: Secondary | ICD-10-CM

## 2017-11-27 DIAGNOSIS — L299 Pruritus, unspecified: Secondary | ICD-10-CM

## 2017-11-27 DIAGNOSIS — Z794 Long term (current) use of insulin: Secondary | ICD-10-CM | POA: Diagnosis not present

## 2017-11-27 DIAGNOSIS — I5032 Chronic diastolic (congestive) heart failure: Secondary | ICD-10-CM

## 2017-11-27 DIAGNOSIS — L539 Erythematous condition, unspecified: Secondary | ICD-10-CM

## 2017-11-27 DIAGNOSIS — R4189 Other symptoms and signs involving cognitive functions and awareness: Secondary | ICD-10-CM | POA: Diagnosis not present

## 2017-11-27 DIAGNOSIS — I130 Hypertensive heart and chronic kidney disease with heart failure and stage 1 through stage 4 chronic kidney disease, or unspecified chronic kidney disease: Secondary | ICD-10-CM | POA: Diagnosis not present
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
## 2017-11-28 LAB — BASIC METABOLIC PANEL
BUN: 62 — AB (ref 4–21)
CREATININE: 1.4 — AB (ref 0.5–1.1)
Glucose: 94
POTASSIUM: 3.9 (ref 3.4–5.3)
Sodium: 133 — AB (ref 137–147)

## 2017-11-28 LAB — CBC AND DIFFERENTIAL
HCT: 30 — AB (ref 36–46)
Hemoglobin: 9.8 — AB (ref 12.0–16.0)
Platelets: 262 (ref 150–399)
WBC: 6.5

## 2017-12-02 LAB — BASIC METABOLIC PANEL
BUN: 66 — AB (ref 4–21)
BUN: 66 — AB (ref 4–21)
CALCIUM: 8.6
CREATININE: 1.61
Creatinine: 1.6 — AB (ref 0.5–1.1)
GLUCOSE: 91
Glucose: 91
Potassium: 3.5
Potassium: 3.5 (ref 3.4–5.3)
SODIUM: 134
Sodium: 134 — AB (ref 137–147)

## 2017-12-03 ENCOUNTER — Non-Acute Institutional Stay (SKILLED_NURSING_FACILITY): Payer: Medicare Other | Admitting: Family

## 2017-12-03 ENCOUNTER — Encounter: Payer: Self-pay | Admitting: Family

## 2017-12-03 DIAGNOSIS — L03115 Cellulitis of right lower limb: Secondary | ICD-10-CM

## 2017-12-03 DIAGNOSIS — I482 Chronic atrial fibrillation, unspecified: Secondary | ICD-10-CM

## 2017-12-03 DIAGNOSIS — N183 Chronic kidney disease, stage 3 unspecified: Secondary | ICD-10-CM

## 2017-12-03 DIAGNOSIS — I5032 Chronic diastolic (congestive) heart failure: Secondary | ICD-10-CM
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
## 2017-12-05 ENCOUNTER — Emergency Department (HOSPITAL_COMMUNITY): Payer: Medicare Other

## 2017-12-05 ENCOUNTER — Other Ambulatory Visit: Payer: Self-pay

## 2017-12-05 ENCOUNTER — Telehealth: Payer: Self-pay

## 2017-12-05 ENCOUNTER — Encounter (HOSPITAL_COMMUNITY): Payer: Self-pay

## 2017-12-05 ENCOUNTER — Inpatient Hospital Stay (HOSPITAL_COMMUNITY)
Admission: EM | Admit: 2017-12-05 | Discharge: 2017-12-14 | DRG: 871 | Disposition: A | Discharge status: Skilled Nursing Facility | Payer: Medicare Other | Attending: Internal Medicine | Admitting: Internal Medicine

## 2017-12-05 DIAGNOSIS — Z79899 Other long term (current) drug therapy: Secondary | ICD-10-CM

## 2017-12-05 DIAGNOSIS — L03115 Cellulitis of right lower limb: Secondary | ICD-10-CM | POA: Diagnosis present

## 2017-12-05 DIAGNOSIS — E11622 Type 2 diabetes mellitus with other skin ulcer: Secondary | ICD-10-CM | POA: Diagnosis present

## 2017-12-05 DIAGNOSIS — R579 Shock, unspecified: Secondary | ICD-10-CM

## 2017-12-05 DIAGNOSIS — W19XXXA Unspecified fall, initial encounter: Secondary | ICD-10-CM | POA: Diagnosis present

## 2017-12-05 DIAGNOSIS — I480 Paroxysmal atrial fibrillation: Secondary | ICD-10-CM | POA: Diagnosis present

## 2017-12-05 DIAGNOSIS — I5033 Acute on chronic diastolic (congestive) heart failure: Secondary | ICD-10-CM | POA: Diagnosis not present

## 2017-12-05 DIAGNOSIS — Z66 Do not resuscitate: Secondary | ICD-10-CM | POA: Diagnosis present

## 2017-12-05 DIAGNOSIS — I25810 Atherosclerosis of coronary artery bypass graft(s) without angina pectoris: Secondary | ICD-10-CM | POA: Diagnosis not present

## 2017-12-05 DIAGNOSIS — I959 Hypotension, unspecified: Secondary | ICD-10-CM

## 2017-12-05 DIAGNOSIS — E0781 Sick-euthyroid syndrome: Secondary | ICD-10-CM | POA: Diagnosis present

## 2017-12-05 DIAGNOSIS — A409 Streptococcal sepsis, unspecified: Secondary | ICD-10-CM | POA: Diagnosis not present

## 2017-12-05 DIAGNOSIS — G629 Polyneuropathy, unspecified: Secondary | ICD-10-CM | POA: Diagnosis present

## 2017-12-05 DIAGNOSIS — R05 Cough: Secondary | ICD-10-CM | POA: Diagnosis not present

## 2017-12-05 DIAGNOSIS — Z7901 Long term (current) use of anticoagulants: Secondary | ICD-10-CM

## 2017-12-05 DIAGNOSIS — Z09 Encounter for follow-up examination after completed treatment for conditions other than malignant neoplasm: Secondary | ICD-10-CM

## 2017-12-05 DIAGNOSIS — N189 Chronic kidney disease, unspecified: Secondary | ICD-10-CM | POA: Diagnosis not present

## 2017-12-05 DIAGNOSIS — R509 Fever, unspecified: Secondary | ICD-10-CM

## 2017-12-05 DIAGNOSIS — R52 Pain, unspecified: Secondary | ICD-10-CM | POA: Diagnosis not present

## 2017-12-05 DIAGNOSIS — Z961 Presence of intraocular lens: Secondary | ICD-10-CM | POA: Diagnosis present

## 2017-12-05 DIAGNOSIS — L02415 Cutaneous abscess of right lower limb: Secondary | ICD-10-CM | POA: Diagnosis present

## 2017-12-05 DIAGNOSIS — E46 Unspecified protein-calorie malnutrition: Secondary | ICD-10-CM | POA: Diagnosis present

## 2017-12-05 DIAGNOSIS — L0291 Cutaneous abscess, unspecified: Secondary | ICD-10-CM | POA: Diagnosis not present

## 2017-12-05 DIAGNOSIS — L02413 Cutaneous abscess of right upper limb: Secondary | ICD-10-CM | POA: Diagnosis not present

## 2017-12-05 DIAGNOSIS — I4891 Unspecified atrial fibrillation: Secondary | ICD-10-CM | POA: Diagnosis not present

## 2017-12-05 DIAGNOSIS — R946 Abnormal results of thyroid function studies: Secondary | ICD-10-CM | POA: Diagnosis present

## 2017-12-05 DIAGNOSIS — E1139 Type 2 diabetes mellitus with other diabetic ophthalmic complication: Secondary | ICD-10-CM | POA: Diagnosis present

## 2017-12-05 DIAGNOSIS — Z882 Allergy status to sulfonamides status: Secondary | ICD-10-CM

## 2017-12-05 DIAGNOSIS — Z8249 Family history of ischemic heart disease and other diseases of the circulatory system: Secondary | ICD-10-CM

## 2017-12-05 DIAGNOSIS — K219 Gastro-esophageal reflux disease without esophagitis: Secondary | ICD-10-CM | POA: Diagnosis present

## 2017-12-05 DIAGNOSIS — E876 Hypokalemia: Secondary | ICD-10-CM | POA: Diagnosis not present

## 2017-12-05 DIAGNOSIS — Z888 Allergy status to other drugs, medicaments and biological substances status: Secondary | ICD-10-CM

## 2017-12-05 DIAGNOSIS — I130 Hypertensive heart and chronic kidney disease with heart failure and stage 1 through stage 4 chronic kidney disease, or unspecified chronic kidney disease: Secondary | ICD-10-CM | POA: Diagnosis present

## 2017-12-05 DIAGNOSIS — L03119 Cellulitis of unspecified part of limb: Secondary | ICD-10-CM | POA: Diagnosis not present

## 2017-12-05 DIAGNOSIS — M545 Low back pain: Secondary | ICD-10-CM | POA: Diagnosis not present

## 2017-12-05 DIAGNOSIS — I872 Venous insufficiency (chronic) (peripheral): Secondary | ICD-10-CM | POA: Diagnosis present

## 2017-12-05 DIAGNOSIS — Z794 Long term (current) use of insulin: Secondary | ICD-10-CM

## 2017-12-05 DIAGNOSIS — C220 Liver cell carcinoma: Secondary | ICD-10-CM | POA: Diagnosis present

## 2017-12-05 DIAGNOSIS — E1122 Type 2 diabetes mellitus with diabetic chronic kidney disease: Secondary | ICD-10-CM | POA: Diagnosis present

## 2017-12-05 DIAGNOSIS — S71001A Unspecified open wound, right hip, initial encounter: Secondary | ICD-10-CM | POA: Diagnosis not present

## 2017-12-05 DIAGNOSIS — J9601 Acute respiratory failure with hypoxia: Secondary | ICD-10-CM | POA: Diagnosis not present

## 2017-12-05 DIAGNOSIS — N183 Chronic kidney disease, stage 3 (moderate): Secondary | ICD-10-CM | POA: Diagnosis present

## 2017-12-05 DIAGNOSIS — R6520 Severe sepsis without septic shock: Secondary | ICD-10-CM | POA: Diagnosis not present

## 2017-12-05 DIAGNOSIS — I482 Chronic atrial fibrillation: Secondary | ICD-10-CM | POA: Diagnosis present

## 2017-12-05 DIAGNOSIS — I2510 Atherosclerotic heart disease of native coronary artery without angina pectoris: Secondary | ICD-10-CM | POA: Diagnosis present

## 2017-12-05 DIAGNOSIS — R0989 Other specified symptoms and signs involving the circulatory and respiratory systems: Secondary | ICD-10-CM

## 2017-12-05 DIAGNOSIS — L03116 Cellulitis of left lower limb: Secondary | ICD-10-CM | POA: Diagnosis present

## 2017-12-05 DIAGNOSIS — A400 Sepsis due to streptococcus, group A: Secondary | ICD-10-CM | POA: Diagnosis not present

## 2017-12-05 DIAGNOSIS — S32009A Unspecified fracture of unspecified lumbar vertebra, initial encounter for closed fracture: Secondary | ICD-10-CM | POA: Diagnosis present

## 2017-12-05 DIAGNOSIS — A419 Sepsis, unspecified organism: Secondary | ICD-10-CM | POA: Diagnosis present

## 2017-12-05 DIAGNOSIS — M5489 Other dorsalgia: Secondary | ICD-10-CM | POA: Diagnosis not present

## 2017-12-05 DIAGNOSIS — T502X5A Adverse effect of carbonic-anhydrase inhibitors, benzothiadiazides and other diuretics, initial encounter: Secondary | ICD-10-CM | POA: Diagnosis not present

## 2017-12-05 DIAGNOSIS — R0602 Shortness of breath: Secondary | ICD-10-CM | POA: Diagnosis not present

## 2017-12-05 DIAGNOSIS — Z9842 Cataract extraction status, left eye: Secondary | ICD-10-CM

## 2017-12-05 DIAGNOSIS — Z955 Presence of coronary angioplasty implant and graft: Secondary | ICD-10-CM

## 2017-12-05 DIAGNOSIS — I248 Other forms of acute ischemic heart disease: Secondary | ICD-10-CM | POA: Diagnosis present

## 2017-12-05 DIAGNOSIS — N179 Acute kidney failure, unspecified: Secondary | ICD-10-CM | POA: Diagnosis not present

## 2017-12-05 DIAGNOSIS — Z9841 Cataract extraction status, right eye: Secondary | ICD-10-CM

## 2017-12-05 DIAGNOSIS — S3991XA Unspecified injury of abdomen, initial encounter: Secondary | ICD-10-CM | POA: Diagnosis not present

## 2017-12-05 DIAGNOSIS — Z87891 Personal history of nicotine dependence: Secondary | ICD-10-CM

## 2017-12-05 DIAGNOSIS — I361 Nonrheumatic tricuspid (valve) insufficiency: Secondary | ICD-10-CM | POA: Diagnosis not present

## 2017-12-05 LAB — CBC WITH DIFFERENTIAL/PLATELET
BASOS PCT: 0 %
Basophils Absolute: 0 10*3/uL (ref 0.0–0.1)
EOS PCT: 0 %
Eosinophils Absolute: 0 10*3/uL (ref 0.0–0.7)
HEMATOCRIT: 37.2 % (ref 36.0–46.0)
Hemoglobin: 12.1 g/dL (ref 12.0–15.0)
LYMPHS ABS: 0.4 10*3/uL — AB (ref 0.7–4.0)
Lymphocytes Relative: 2 %
MCH: 26.8 pg (ref 26.0–34.0)
MCHC: 32.5 g/dL (ref 30.0–36.0)
MCV: 82.5 fL (ref 78.0–100.0)
MONOS PCT: 4 %
Monocytes Absolute: 0.9 10*3/uL (ref 0.1–1.0)
Neutro Abs: 20.4 10*3/uL — ABNORMAL HIGH (ref 1.7–7.7)
Neutrophils Relative %: 94 %
Platelets: 349 10*3/uL (ref 150–400)
RBC: 4.51 MIL/uL (ref 3.87–5.11)
RDW: 18.7 % — AB (ref 11.5–15.5)
WBC: 21.7 10*3/uL — AB (ref 4.0–10.5)

## 2017-12-05 LAB — URINALYSIS, ROUTINE W REFLEX MICROSCOPIC
BACTERIA UA: NONE SEEN
Bilirubin Urine: NEGATIVE
Glucose, UA: NEGATIVE mg/dL
Hgb urine dipstick: NEGATIVE
KETONES UR: NEGATIVE mg/dL
LEUKOCYTES UA: NEGATIVE
Nitrite: NEGATIVE
PROTEIN: 30 mg/dL — AB
SQUAMOUS EPITHELIAL / LPF: NONE SEEN
Specific Gravity, Urine: 1.015 (ref 1.005–1.030)
pH: 5 (ref 5.0–8.0)

## 2017-12-05 LAB — BASIC METABOLIC PANEL
ANION GAP: 15 (ref 5–15)
BUN: 59 mg/dL — ABNORMAL HIGH (ref 6–20)
CO2: 25 mmol/L (ref 22–32)
Calcium: 9 mg/dL (ref 8.9–10.3)
Chloride: 93 mmol/L — ABNORMAL LOW (ref 101–111)
Creatinine, Ser: 1.45 mg/dL — ABNORMAL HIGH (ref 0.44–1.00)
GFR calc Af Amer: 38 mL/min — ABNORMAL LOW (ref >60)
GFR calc non Af Amer: 33 mL/min — ABNORMAL LOW (ref >60)
GLUCOSE: 140 mg/dL — AB (ref 65–99)
POTASSIUM: 4.1 mmol/L (ref 3.5–5.1)
Sodium: 133 mmol/L — ABNORMAL LOW (ref 135–145)

## 2017-12-05 LAB — I-STAT CG4 LACTIC ACID, ED
LACTIC ACID, VENOUS: 1.59 mmol/L (ref 0.5–1.9)
Lactic Acid, Venous: 2.02 mmol/L (ref 0.5–1.9)

## 2017-12-05 LAB — INFLUENZA PANEL BY PCR (TYPE A & B)
Influenza A By PCR: NEGATIVE
Influenza B By PCR: NEGATIVE

## 2017-12-05 MED ORDER — SODIUM CHLORIDE 0.9 % IV BOLUS (SEPSIS)
1000.0000 mL | Freq: Once | INTRAVENOUS | Status: AC
  Administered 2017-12-05: 1000 mL via INTRAVENOUS

## 2017-12-05 MED ORDER — PIPERACILLIN-TAZOBACTAM 3.375 G IVPB 30 MIN
3.3750 g | Freq: Once | INTRAVENOUS | Status: AC
  Administered 2017-12-05: 3.375 g via INTRAVENOUS
  Filled 2017-12-05: qty 50

## 2017-12-05 MED ORDER — INSULIN ASPART 100 UNIT/ML ~~LOC~~ SOLN
0.0000 [IU] | Freq: Three times a day (TID) | SUBCUTANEOUS | Status: DC
  Administered 2017-12-08 – 2017-12-10 (×4): 1 [IU] via SUBCUTANEOUS

## 2017-12-05 MED ORDER — VANCOMYCIN HCL 10 G IV SOLR
1500.0000 mg | Freq: Once | INTRAVENOUS | Status: AC
  Administered 2017-12-05: 1500 mg via INTRAVENOUS
  Filled 2017-12-05: qty 1500

## 2017-12-05 MED ORDER — SODIUM CHLORIDE 0.9 % IV BOLUS (SEPSIS)
500.0000 mL | Freq: Once | INTRAVENOUS | Status: AC
  Administered 2017-12-05: 500 mL via INTRAVENOUS

## 2017-12-05 MED ORDER — ONDANSETRON HCL 4 MG PO TABS: 4.0000 mg | ORAL_TABLET | Freq: Four times a day (QID) | ORAL | Status: DC | PRN

## 2017-12-05 MED ORDER — ACETAMINOPHEN 650 MG RE SUPP: 650.0000 mg | Freq: Four times a day (QID) | RECTAL | Status: DC | PRN

## 2017-12-05 MED ORDER — ACETAMINOPHEN 325 MG PO TABS
650.0000 mg | ORAL_TABLET | Freq: Four times a day (QID) | ORAL | Status: DC | PRN
  Administered 2017-12-06 – 2017-12-14 (×11): 650 mg via ORAL
  Filled 2017-12-05 (×10): qty 2

## 2017-12-05 MED ORDER — ACETAMINOPHEN 650 MG RE SUPP
650.0000 mg | Freq: Once | RECTAL | Status: AC
  Administered 2017-12-05: 650 mg via RECTAL
  Filled 2017-12-05: qty 1

## 2017-12-05 MED ORDER — ONDANSETRON HCL 4 MG/2ML IJ SOLN: 4.0000 mg | Freq: Four times a day (QID) | INTRAMUSCULAR | Status: DC | PRN

## 2017-12-05 MED ORDER — ACETAMINOPHEN 500 MG PO TABS: 1000.0000 mg | ORAL_TABLET | Freq: Once | ORAL | Status: DC

## 2017-12-05 MED ORDER — LIDOCAINE-EPINEPHRINE (PF) 2 %-1:200000 IJ SOLN
INTRAMUSCULAR | Status: AC
  Administered 2017-12-05
  Filled 2017-12-05: qty 20

## 2017-12-05 MED ORDER — ACETAMINOPHEN 500 MG PO TABS
1000.0000 mg | ORAL_TABLET | Freq: Once | ORAL | Status: AC
  Administered 2017-12-05: 1000 mg via ORAL
  Filled 2017-12-05: qty 2

## 2017-12-05 MED ORDER — SODIUM CHLORIDE 0.9 % IV SOLN
INTRAVENOUS | Status: DC
  Administered 2017-12-06 (×4): via INTRAVENOUS

## 2017-12-05 MED ORDER — MORPHINE SULFATE (PF) 4 MG/ML IV SOLN
4.0000 mg | Freq: Once | INTRAVENOUS | Status: AC
  Administered 2017-12-05: 4 mg via INTRAMUSCULAR
  Filled 2017-12-05: qty 1
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
## 2017-12-05 NOTE — Telephone Encounter (Signed)
Copied from [place] [id number]. Topic: General - Other >> Dec 05, 2017  2:49 PM Lennox Solders wrote: Reason for CRM: [facility] and would like to enroll again for palliative care. Manus Gunning from [facility] needs a verbal order. Manus Gunning phone [phone]

## 2017-12-05 NOTE — ED Notes (Signed)
Bed: YE18 Expected date:  Expected time:  Means of arrival:  Comments: EMS-back pain

## 2017-12-05 NOTE — ED Notes (Signed)
I gave critical I Stat  Lactic result to MD Tyrone Nine

## 2017-12-05 NOTE — ED Provider Notes (Addendum)
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 results and plan were reviewed and discussed.   Any x-rays performed were independently reviewed by myself.   Differential diagnosis were considered with the presenting HPI.  Medications  vancomycin (VANCOCIN) 1,500 mg in sodium chloride 0.9 % 500 mL  IVPB (0 mg Intravenous Hold 12/05/17 2120)  acetaminophen (TYLENOL) tablet 1,000 mg (1,000 mg Oral Given 12/05/17 1834)  morphine 4 MG/ML injection 4 mg (4 mg Intramuscular Given 12/05/17 1833)  sodium chloride 0.9 % bolus 1,000 mL (1,000 mLs Intravenous New Bag/Given 12/05/17 1850)  sodium chloride 0.9 % bolus 1,000 mL (1,000 mLs Intravenous New Bag/Given 12/05/17 2119)  acetaminophen (TYLENOL) suppository 650 mg (650 mg Rectal Given 12/05/17 2115)  piperacillin-tazobactam (ZOSYN) IVPB 3.375 g (3.375 g Intravenous New Bag/Given 12/05/17 2115)  sodium chloride  0.9 % bolus 500 mL (500 mLs Intravenous New Bag/Given 12/05/17 2119)    Vitals:   12/05/17 1900 12/05/17 2034 12/05/17 2100 12/05/17 2102  BP: (!) 92/49  (!) 72/48 (!) 74/56  Pulse: (!) 107  (!) 103 (!) 102  Resp: 14  15 14   Temp:  (!) 102.1 F (38.9 C)    TempSrc:  Rectal    SpO2: 98%  95% 93%  Weight:      Height:        Final diagnoses:  Atrial fibrillation with rapid ventricular response (HCC)  Fever in adult  [facility])    Admission/ observation were discussed with the admitting physician, patient and/or family and they are comfortable with the plan.    Final Clinical Impressions(s) / ED Diagnoses   Final diagnoses:  Atrial fibrillation with rapid ventricular response ([place])  Fever in adult  [facility])    ED Discharge Orders    None       Deno Etienne, DO 12/05/17 2242    Deno Etienne, DO 12/05/17 2337

## 2017-12-05 NOTE — ED Notes (Signed)
EDP made aware of patient BP, unable to obtain manual BP.

## 2017-12-05 NOTE — Telephone Encounter (Signed)
Called and provided Pallative Care verbal order as requested

## 2017-12-05 NOTE — ED Notes (Signed)
Blood cultures collected before abx started

## 2017-12-05 NOTE — ED Notes (Signed)
Patient stated, "I ain't leaving this room until my son comes and picks me up."

## 2017-12-05 NOTE — ED Notes (Signed)
Patient transported to CT 

## 2017-12-06 DIAGNOSIS — N179 Acute kidney failure, unspecified: Secondary | ICD-10-CM | POA: Diagnosis present

## 2017-12-06 DIAGNOSIS — N189 Chronic kidney disease, unspecified: Secondary | ICD-10-CM

## 2017-12-06 DIAGNOSIS — L02415 Cutaneous abscess of right lower limb: Secondary | ICD-10-CM | POA: Diagnosis present

## 2017-12-06 DIAGNOSIS — L03115 Cellulitis of right lower limb: Secondary | ICD-10-CM

## 2017-12-06 LAB — GLUCOSE, CAPILLARY
GLUCOSE-CAPILLARY: 95 mg/dL (ref 65–99)
GLUCOSE-CAPILLARY: 96 mg/dL (ref 65–99)
Glucose-Capillary: 151 mg/dL — ABNORMAL HIGH (ref 65–99)

## 2017-12-06 LAB — COMPREHENSIVE METABOLIC PANEL
ALBUMIN: 2.7 g/dL — AB (ref 3.5–5.0)
ALT: 19 U/L (ref 14–54)
ANION GAP: 12 (ref 5–15)
AST: 55 U/L — ABNORMAL HIGH (ref 15–41)
Alkaline Phosphatase: 100 U/L (ref 38–126)
BILIRUBIN TOTAL: 1.7 mg/dL — AB (ref 0.3–1.2)
BUN: 56 mg/dL — ABNORMAL HIGH (ref 6–20)
CO2: 23 mmol/L (ref 22–32)
Calcium: 7.9 mg/dL — ABNORMAL LOW (ref 8.9–10.3)
Chloride: 99 mmol/L — ABNORMAL LOW (ref 101–111)
Creatinine, Ser: 1.63 mg/dL — ABNORMAL HIGH (ref 0.44–1.00)
GFR calc non Af Amer: 28 mL/min — ABNORMAL LOW (ref >60)
GFR, EST AFRICAN AMERICAN: 33 mL/min — AB (ref >60)
GLUCOSE: 107 mg/dL — AB (ref 65–99)
POTASSIUM: 4.3 mmol/L (ref 3.5–5.1)
SODIUM: 134 mmol/L — AB (ref 135–145)
TOTAL PROTEIN: 6 g/dL — AB (ref 6.5–8.1)

## 2017-12-06 LAB — BLOOD CULTURE ID PANEL (REFLEXED)
Acinetobacter baumannii: NOT DETECTED
CANDIDA KRUSEI: NOT DETECTED
Candida albicans: NOT DETECTED
Candida glabrata: NOT DETECTED
Candida parapsilosis: NOT DETECTED
Candida tropicalis: NOT DETECTED
Enterobacter cloacae complex: NOT DETECTED
Enterobacteriaceae species: NOT DETECTED
Enterococcus species: NOT DETECTED
Escherichia coli: NOT DETECTED
HAEMOPHILUS INFLUENZAE: NOT DETECTED
Klebsiella oxytoca: NOT DETECTED
Klebsiella pneumoniae: NOT DETECTED
Listeria monocytogenes: NOT DETECTED
NEISSERIA MENINGITIDIS: NOT DETECTED
PROTEUS SPECIES: NOT DETECTED
PSEUDOMONAS AERUGINOSA: NOT DETECTED
SERRATIA MARCESCENS: NOT DETECTED
STAPHYLOCOCCUS AUREUS BCID: NOT DETECTED
STAPHYLOCOCCUS SPECIES: NOT DETECTED
STREPTOCOCCUS PNEUMONIAE: NOT DETECTED
STREPTOCOCCUS SPECIES: DETECTED — AB
Streptococcus agalactiae: NOT DETECTED
Streptococcus pyogenes: NOT DETECTED

## 2017-12-06 LAB — CBC WITH DIFFERENTIAL/PLATELET
BASOS ABS: 0 10*3/uL (ref 0.0–0.1)
Basophils Relative: 0 %
Eosinophils Absolute: 0 10*3/uL (ref 0.0–0.7)
Eosinophils Relative: 0 %
HCT: 33 % — ABNORMAL LOW (ref 36.0–46.0)
HEMOGLOBIN: 10.5 g/dL — AB (ref 12.0–15.0)
LYMPHS PCT: 3 %
Lymphs Abs: 0.9 10*3/uL (ref 0.7–4.0)
MCH: 26.3 pg (ref 26.0–34.0)
MCHC: 31.8 g/dL (ref 30.0–36.0)
MCV: 82.7 fL (ref 78.0–100.0)
MONOS PCT: 4 %
Monocytes Absolute: 1.3 10*3/uL — ABNORMAL HIGH (ref 0.1–1.0)
NEUTROS ABS: 29.1 10*3/uL — AB (ref 1.7–7.7)
NEUTROS PCT: 93 %
PLATELETS: 265 10*3/uL (ref 150–400)
RBC: 3.99 MIL/uL (ref 3.87–5.11)
RDW: 19 % — ABNORMAL HIGH (ref 11.5–15.5)
WBC MORPHOLOGY: INCREASED
WBC: 31.3 10*3/uL — AB (ref 4.0–10.5)

## 2017-12-06 LAB — PROTIME-INR
INR: 2.22
INR: 2.47
Prothrombin Time: 24.4 seconds — ABNORMAL HIGH (ref 11.4–15.2)
Prothrombin Time: 26.6 seconds — ABNORMAL HIGH (ref 11.4–15.2)

## 2017-12-06 LAB — T4, FREE: FREE T4: 0.87 ng/dL (ref 0.61–1.12)

## 2017-12-06 LAB — TSH: TSH: 14.674 u[IU]/mL — AB (ref 0.350–4.500)

## 2017-12-06 LAB — MRSA PCR SCREENING: MRSA BY PCR: POSITIVE — AB

## 2017-12-06 LAB — TROPONIN I: TROPONIN I: 1.89 ng/mL — AB (ref <0.03)

## 2017-12-06 LAB — PREALBUMIN: PREALBUMIN: 9.7 mg/dL — AB (ref 18–38)

## 2017-12-06 LAB — BRAIN NATRIURETIC PEPTIDE: B NATRIURETIC PEPTIDE 5: 1595 pg/mL — AB (ref 0.0–100.0)

## 2017-12-06 MED ORDER — WARFARIN SODIUM 2.5 MG PO TABS
2.5000 mg | ORAL_TABLET | Freq: Once | ORAL | Status: AC
  Administered 2017-12-06: 2.5 mg via ORAL
  Filled 2017-12-06: qty 1

## 2017-12-06 MED ORDER — WARFARIN SODIUM 2.5 MG PO TABS: 2.5000 mg | ORAL_TABLET | ORAL | Status: DC

## 2017-12-06 MED ORDER — SODIUM CHLORIDE 0.9 % IV BOLUS (SEPSIS)
1000.0000 mL | Freq: Once | INTRAVENOUS | Status: AC
  Administered 2017-12-06: 1000 mL via INTRAVENOUS

## 2017-12-06 MED ORDER — WARFARIN - PHARMACIST DOSING INPATIENT
Freq: Every day | Status: DC
  Administered 2017-12-11: 18:00:00

## 2017-12-06 MED ORDER — HYDROXYZINE HCL 10 MG PO TABS
10.0000 mg | ORAL_TABLET | Freq: Two times a day (BID) | ORAL | Status: DC | PRN
  Filled 2017-12-06: qty 1

## 2017-12-06 MED ORDER — CHLORHEXIDINE GLUCONATE CLOTH 2 % EX PADS
6.0000 | MEDICATED_PAD | Freq: Every day | CUTANEOUS | Status: AC
  Administered 2017-12-06 – 2017-12-10 (×3): 6 via TOPICAL

## 2017-12-06 MED ORDER — FUROSEMIDE 10 MG/ML IJ SOLN
20.0000 mg | Freq: Every day | INTRAMUSCULAR | Status: DC
  Administered 2017-12-06: 20 mg via INTRAVENOUS
  Filled 2017-12-06 (×2): qty 2

## 2017-12-06 MED ORDER — COLLAGENASE 250 UNIT/GM EX OINT
TOPICAL_OINTMENT | Freq: Every day | CUTANEOUS | Status: DC
  Administered 2017-12-06 – 2017-12-10 (×3): via TOPICAL
  Administered 2017-12-11: 1 via TOPICAL
  Administered 2017-12-12 – 2017-12-14 (×3): via TOPICAL
  Filled 2017-12-06: qty 90
  Filled 2017-12-06 (×2): qty 30
  Filled 2017-12-06: qty 90

## 2017-12-06 MED ORDER — MUPIROCIN 2 % EX OINT
1.0000 "application " | TOPICAL_OINTMENT | Freq: Two times a day (BID) | CUTANEOUS | Status: AC
  Administered 2017-12-06 – 2017-12-10 (×10): 1 via NASAL
  Filled 2017-12-06 (×2): qty 22

## 2017-12-06 MED ORDER — VANCOMYCIN HCL IN DEXTROSE 1-5 GM/200ML-% IV SOLN: 1000.0000 mg | INTRAVENOUS | Status: DC

## 2017-12-06 MED ORDER — PIPERACILLIN-TAZOBACTAM 3.375 G IVPB
3.3750 g | Freq: Three times a day (TID) | INTRAVENOUS | Status: DC
  Administered 2017-12-06 (×2): 3.375 g via INTRAVENOUS
  Filled 2017-12-06 (×2): qty 50

## 2017-12-06 MED ORDER — LIDOCAINE 5 % EX PTCH
1.0000 | MEDICATED_PATCH | Freq: Every day | CUTANEOUS | Status: DC | PRN
  Administered 2017-12-06: 1 via TRANSDERMAL
  Filled 2017-12-06 (×2): qty 1

## 2017-12-06 MED ORDER — WARFARIN SODIUM 5 MG PO TABS: 5.0000 mg | ORAL_TABLET | ORAL | Status: DC

## 2017-12-06 MED ORDER — DEXTROSE 5 % IV SOLN
2.0000 g | INTRAVENOUS | Status: DC
  Administered 2017-12-06 – 2017-12-13 (×8): 2 g via INTRAVENOUS
  Filled 2017-12-06 (×9): qty 2

## 2017-12-06 MED ORDER — PANTOPRAZOLE SODIUM 40 MG IV SOLR
40.0000 mg | Freq: Two times a day (BID) | INTRAVENOUS | Status: DC
  Administered 2017-12-06 – 2017-12-14 (×18): 40 mg via INTRAVENOUS
  Filled 2017-12-06 (×18): qty 40

## 2017-12-06 MED ORDER — SODIUM CHLORIDE 0.9 % IV BOLUS (SEPSIS)
500.0000 mL | INTRAVENOUS | Status: AC
  Administered 2017-12-06: 500 mL via INTRAVENOUS

## 2017-12-06 NOTE — Progress Notes (Signed)
Initial Nutrition Assessment  DOCUMENTATION CODES:   Not applicable  INTERVENTION:  - Diet advancement as medically feasible. - RD will monitor for need for interventions with diet advancement.   NUTRITION DIAGNOSIS:   Inadequate oral intake related to inability to eat as evidenced by NPO status.  GOAL:   Patient will meet greater than or equal to 90% of their needs  MONITOR:   Diet advancement, Weight trends, Labs, Skin  REASON FOR ASSESSMENT:   Malnutrition Screening Tool  ASSESSMENT:   83 y.o. female with medical history significant of  CAD, diastolic CHF, pulmonary HTN, NSTEMI, HTN and atrial fibrillation. Patient reports having acute onset of chills and lower back pain. Patient was somewhat of a poor historian. The patient was recently admitted for lower extremity cellulitis. Today (12/6) patient was transferred from [facility] with acute complaints of a burning lower back pain on the left side and chills. Pain in her lower back was constant and reported as severe. Associated symptoms included generalized malaise, weakness, and thirst. Nursing staff reported patient having a fall approximately 3 weeks ago, but denied any pain at that time  BMI indicates normal weight. Pt has been NPO today and unable to meet needs. She is requesting coffee and chips during RD visit. She states dry mouth. She reports she last ate yesterday afternoon but is unable to give further detail. Unable to obtain much other information from pt at this time, other than pain to ULE. Flowsheet indicates she is a/o to self and time only. Per chart review, pt weighed 201 lbs on 10/8 which would indicate 28 lb weight loss (14% body weight) in the past 2 months. This is significant for time frame. Unable to identify malnutrition at this time based on other factors; will continue to monitor closely.   Medications reviewed; 20 mg IV Lasix/day, sliding scale Novolog, 40 mg IV Protonix BID.  Labs reviewed; Na: 134  mmol/L, Cl: 99 mmol/L, BUN: 56 mg/dL, creatinine: 1.63 mg/dL, Ca: 7.9 mg/dL, GFR: 28 mL/min.   IVF: NS @ 100 mL/hr.     NUTRITION - FOCUSED PHYSICAL EXAM:  Completed to upper body only with no muscle and no fat wasting noted.     Diet Order:  Diet NPO time specified Except for: Sips with Meds, Ice Chips  EDUCATION NEEDS:   No education needs have been identified at this time  Skin:  Skin Assessment: Skin Integrity Issues: Skin Integrity Issues:: Diabetic Ulcer, Other (Comment) Diabetic Ulcer: Bilateral legs Other: Wound to R leg  Last BM:  12/5 (PTA)  Height:   Ht Readings from Last 1 Encounters:  12/06/17 5\' 10"  (1.778 m)    Weight:   Wt Readings from Last 1 Encounters:  12/06/17 173 lb 11.6 oz (78.8 kg)    Ideal Body Weight:  68.18 kg  BMI:  Body mass index is 24.93 kg/m.  Estimated Nutritional Needs:   Kcal:  1575-1815 (20-23 kcal/kg)  Protein:  65-75 grams  Fluid:  >/= 1.8 L/day      Jarome Matin, MS, RD, LDN, [facility] Inpatient Clinical Dietitian Pager # [phone] After hours/weekend pager # [phone]

## 2017-12-06 NOTE — Progress Notes (Addendum)
CRITICAL VALUE ALERT  Critical Value:  Troponin 1.89  Date & Time Notied:12-06-17 224  Provider Notified: Tamala Julian  Orders Received/Actions taken: Dr. Tamala Julian consulted cardiology

## 2017-12-06 NOTE — Care Management Note (Signed)
Case Management Note  Patient Details  Name: [name] MRN: [id number] Date of Birth: 08-21-1933  Subjective/Objective:                  Sepsis protocol  Action/Plan: Date: December 06, 2017 Velva Harman, BSN, [place], [place] Chart and notes review for patient progress and needs. Will follow for case management and discharge needs. Next review date: [id number]  Expected Discharge Date:                  Expected Discharge Plan:  Home/Self Care  In-House Referral:     Discharge planning Services  CM Consult  Post Acute Care Choice:    Choice offered to:     DME Arranged:    DME Agency:     HH Arranged:    [place] Agency:     Status of Service:  In process, will continue to follow  If discussed at Long Length of Stay Meetings, dates discussed:    Additional Comments:  Leeroy Cha, RN 12/06/2017, 8:29 AM

## 2017-12-06 NOTE — Progress Notes (Addendum)
Called daughter, Areatha Kalata, at [phone] per patient's request to let her know she was in the hospital. Unable to reach her but left message for her to please call me at [phone].

## 2017-12-06 NOTE — Progress Notes (Signed)
ANTICOAGULATION CONSULT NOTE - Follow Up Consult  Pharmacy Consult for Warfarin Indication: atrial fibrillation  Allergies  Allergen Reactions  . Ace Inhibitors Other (See Comments)    Intolerance per Dr. Doug Sou note  . Amlodipine Other (See Comments)    Intolerance per Dr. Doug Sou note   . Statins Other (See Comments)    Muscle soreness  . Sulfa Drugs Cross Reactors Itching  . Zetia [Ezetimibe] Other (See Comments)    Muscle soreness  . Fenofibrate Other (See Comments)    Muscle soreness    Patient Measurements: Height: 5\' 10"  (177.8 cm) Weight: 173 lb 11.6 oz (78.8 kg) IBW/kg (Calculated) : 68.5  Vital Signs: Temp: 98.3 F (36.8 C) (12/07 0800) Temp Source: Oral (12/07 0800) BP: 113/63 (12/07 0800) Pulse Rate: 105 (12/07 0800)  Labs: Recent Labs    12/05/17 1845 12/05/17 1853 12/06/17 0135 12/06/17 0620  HGB 12.1  --  10.5*  --   HCT 37.2  --  33.0*  --   PLT 349  --  265  --   LABPROT  --  24.4*  --  26.6*  INR  --  2.22  --  2.47  CREATININE 1.45*  --  1.63*  --   TROPONINI  --   --  1.89*  --     Estimated Creatinine Clearance: 28.8 mL/min (A) (by C-G formula based on SCr of 1.63 mg/dL (H)).   Medications:  Scheduled:  . Chlorhexidine Gluconate Cloth  6 each Topical Q0600  . collagenase   Topical Daily  . furosemide  20 mg Intravenous Daily  . insulin aspart  0-9 Units Subcutaneous TID WC  . mupirocin ointment  1 application Nasal BID  . pantoprazole (PROTONIX) IV  40 mg Intravenous Q12H  . [START ON 12/07/2017] warfarin  2.5 mg Oral Q T,Th,S,Su-1800  . warfarin  5 mg Oral Q M,W,F-1800  . Warfarin - Pharmacist Dosing Inpatient   Does not apply q1800   Infusions:  . sodium chloride 100 mL/hr at 12/06/17 0549  . piperacillin-tazobactam (ZOSYN)  IV 3.375 g (12/06/17 1300)  . [START ON 12/07/2017] vancomycin      Assessment: 76 yoF [place] from [place] on 12/6 with chills and back pain.  PMH includes chronic anticoagulation on warfarin  for afib.    PTA warfarin dose warfarin per NH note is 2.5mg  po daily, except 5mg  on Monday, Wednesday, Friday.  But per Med history is listed as 3.5 mg (2.5mg  tablet + 1 mg tablet) on Sunday, Tuesday, Thursday, Saturday. Admission INR is therapeutic at 2.22  Today, 12/06/2017: INR 2.47, remains therapeutic CBC: Hgb decreased to 10.5, Plt remain WNL No bleeding or complications reported. Diet: NPO No major drug-drug interactions identified.  Broad spectrum antibiotics may increase INR.   Goal of Therapy:  INR 2-3 Monitor platelets by anticoagulation protocol: Yes   Plan:   Warfarin 2.5 mg PO at 1800 x1 dose.  Daily PT/INR.  Monitor for signs and symptoms of bleeding.  Clarify med history warfarin dosing.  Gretta Arab PharmD, BCPS Pager [phone] 12/06/2017 2:40 PM
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
## 2017-12-06 NOTE — Consult Note (Signed)
[facility] Nurse wound consult note Reason for Consult:BLE full thickness and partial thickness  ulcers and cellulitis Wound type:venous insufficiency Pressure Injury POA: NA Measurement:left pretibial lateral wound is 2cm x 0.8cm x 0.1cm Medial wound is 4cm x 1.5cm x 0.1cm both have 100% yellow soft slough covering wound beds, scant serous drainage, no odor. Left posterior lower calf has medial wound that is 1cm x 0.5cm and lateral wound that is 1.5cm x 2cm x 0.1cm with pink wound beds, no drainage or odor. Both lower legs have cellulitis. Right upper medial thigh has 3cm round reddened, edematous area with site where MD's perform I&D in ED yesterday.  I had planned to place Iodoform gauze strip to keep open, however, the incision has closed over. Wound bed:see above Drainage (amount, consistency, odor) see above Periwound:cellulitis Dressing procedure/placement/frequency:I have provided nurses with orders for Santyl to both pretibial wounds on left leg, Xeroform to left posterior calf wounds as well as right upper thigh wound. Lab results suggest poor nutrition, supplements or nutritional consult suggested for optimum healing of wounds. We will not follow, but will remain available to this patient, to nursing, and the medical and/or surgical teams.  Please re-consult if we need to assist further.   Fara Olden, RN-C, WTA-C Wound Treatment Associate

## 2017-12-06 NOTE — Progress Notes (Signed)
BP 63/41. Paged MD. New order for another liter bolus over 2 hours. Continue to monitor.

## 2017-12-06 NOTE — Plan of Care (Signed)
  Progressing Education: Knowledge of General Education information will improve 12/06/2017 1744 - Progressing by Staci Righter, RN Health Behavior/Discharge Planning: Ability to manage health-related needs will improve 12/06/2017 1744 - Progressing by Staci Righter, RN Clinical Measurements: Respiratory complications will improve 12/06/2017 1744 - Progressing by Staci Righter, RN

## 2017-12-06 NOTE — Progress Notes (Signed)
Patient's BP dropped to 67/43 consecutively. Paged Dr. Tamala Julian. New orders in Epic. Continue to monitor.

## 2017-12-06 NOTE — Progress Notes (Signed)
PROGRESS NOTE    [name]  OAC:[id number] DOB: 21-Jun-1933 DOA: 12/05/2017 PCP: [name], MD    Brief Narrative:  [name] is a 83 y.o. female with medical history significant of CAD, diastolic CHF, pulmonary HTN, NSTEMI, HTN and atrial fibrillationon coumadin; reports having acute onset of chills and lower back pain, . She was admitted for sepsis of unclear etiology.      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 Admitted to stepdown for hypotension, closer monitoring. Patient's blood pressure improved her map is greater than 65.  She was started on broad-spectrum antibiotics with vancomycin and cefepime.  As her blood cultures showed Streptococcus we have narrowed her antibiotic to Rocephin.  Wound care consulted for bilateral lower extremity ulcers and recommendations given.  Possible enteritis with ileus seen on CT abdomen pelvis Clear liquid diet.  She denies any nausea vomiting or abdominal pain at this time.   Hypotension Resolved   Chronic atrial fibrillation with RVR Rates better after hydration On chronic anticoagulation with Coumadin.    Lumbar fractures, age indeterminate: Probably secondary to the fall few weeks ago.  Pain control and physical therapy evaluation.   Mild diastolic heart failure acute on chronic Chest x-ray showed mild CHF she is got bilateral pedal edema and is requiring oxygen at this time. Resume Lasix as needed.   Elevated troponins Probably from demand ischemia from sepsis.  Overnight physician spoke with Dr. Percival Spanish and recommended no  ischemic workup at this time.  Patient  denies any chest pain shortness of breath or presyncope.   Type 2 diabetes mellitus Last A1c is 5.5, holding metformin.  Check CBGs with sensitive scale insulin.   History of chronic venous stasis ulcerations Wound care consulted and recommendations given   Possible hepatocellular carcinoma No workup as per the family as per the daughter at bedside.    View of her multiple medical problems and admission for sepsis if patient does not improve clinically would warrant a palliative care consult and possible transition to comfort care.    DVT prophylaxis: COUMADIN.  Code Status: DNR Family Communication: none at bedside, discussed with daughter  Disposition Plan: pending further evaluation.    Consultants:  SLP Wound care Phone consult with cardiology.   Procedures: none.   Antimicrobials: vancomycin and cefepiem since admission later narrowed to rocephin for streptococcus.    Subjective: Appears comfortable denies any new complaints.   Objective: Vitals:   12/06/17 0645 12/06/17 0700 12/06/17 0715 12/06/17 0800  BP: 98/71 (!) 84/48 (!) 86/54 113/63  Pulse: (!) 102 91 85 (!) 105  Resp: 20 16 15  (!) 23  Temp:    98.3 F (36.8 C)  TempSrc:    Oral  SpO2: 96% 95% 96% 98%  Weight:      Height:        Intake/Output Summary (Last 24 hours) at 12/06/2017 1614 Last data filed at 12/06/2017 1258 Gross per 24 hour  Intake 8056.67 ml  Output 200 ml  Net 7856.67 ml   Filed Weights   12/05/17 1823 12/06/17 0420  Weight: 76.2 kg (168 lb) 78.8 kg (173 lb 11.6 oz)    Examination:  General exam: Appears calm and comfortable on 3 lit of [place] oxygen.  Respiratory system: basilar crackles, no wheezing or rhonchi.  Cardiovascular system: S1 & S2 heard, RRR. No JVD, murmurs, rubs, gallops or clicks. No pedal edema. Gastrointestinal system: Abdomen is nondistended, soft and nontender. No organomegaly or masses felt. Normal bowel sounds heard. Central nervous system: Alert and  oriented. No focal neurological deficits. Extremities: left lower extremity swelling with left pretibial lateral wound is 2cm x 0.8cm x 0.1cm Skin: No rashes, lesions or ulcers Psychiatry:  Mood & affect appropriate.     Data Reviewed: I have personally reviewed following labs and imaging studies  CBC: Recent Labs  Lab 12/05/17 1845 12/06/17 0135  WBC 21.7* 31.3*  NEUTROABS 20.4* 29.1*  HGB 12.1 10.5*  HCT 37.2 33.0*  MCV 82.5 82.7  PLT 349 660   Basic Metabolic Panel: Recent Labs  Lab 12/02/17 12/05/17 1845 12/06/17 0135  NA 134 133* 134*  K 3.5 4.1 4.3  CL  --  93* 99*  CO2  --  25 23  GLUCOSE  --  140* 107*  BUN 66* 59* 56*  CREATININE 1.61 1.45* 1.63*  CALCIUM 8.6 9.0 7.9*   GFR: Estimated Creatinine Clearance: 28.8 mL/min (A) (by C-G formula based on SCr of 1.63 mg/dL (H)). Liver Function Tests: Recent Labs  Lab 12/06/17 0135  AST 55*  ALT 19  ALKPHOS 100  BILITOT 1.7*  PROT 6.0*  ALBUMIN 2.7*   No results for input(s): LIPASE, AMYLASE in the last 168 hours. No results for input(s): AMMONIA in the last 168 hours. Coagulation Profile: Recent Labs  Lab 12/05/17 1853 12/06/17 0620  INR 2.22 2.47   Cardiac Enzymes: Recent Labs  Lab 12/06/17 0135  TROPONINI 1.89*   BNP (last 3 results) No results for input(s): PROBNP in the last 8760 hours. HbA1C: No results for input(s): HGBA1C in the last 72 hours. CBG: Recent Labs  Lab 12/06/17 0759  GLUCAP 95   Lipid Profile: No results for input(s): CHOL, HDL, LDLCALC, TRIG, CHOLHDL, LDLDIRECT in the last 72 hours. Thyroid Function Tests: Recent Labs    12/06/17 0135  TSH 14.674*  FREET4 0.87   Anemia Panel: No results for input(s): VITAMINB12, FOLATE, FERRITIN, TIBC, IRON, RETICCTPCT in the last 72 hours. Sepsis Labs: Recent Labs  Lab 12/05/17 2023 12/05/17 2241  LATICACIDVEN 2.02* 1.59    Recent Results (from the past 240 hour(s))  Blood culture (routine x 2)     Status: None  (Preliminary result)   Collection Time: 12/05/17  8:56 PM  Result Value Ref Range Status   Specimen Description BLOOD LEFT ANTECUBITAL  Final   Special Requests   Final    BOTTLES DRAWN AEROBIC AND ANAEROBIC Blood Culture adequate volume   Culture  Setup Time   Final    GRAM POSITIVE COCCI IN CHAINS ANAEROBIC BOTTLE ONLY IDENTIFICATION TO FOLLOW CRITICAL VALUE NOTED.  VALUE IS CONSISTENT WITH PREVIOUSLY REPORTED AND CALLED VALUE.    Culture GRAM POSITIVE COCCI  Final   Report Status PENDING  Incomplete  Blood culture (routine x 2)     Status: None (Preliminary result)   Collection Time: 12/05/17  9:01 PM  Result Value Ref Range Status   Specimen Description BLOOD BLOOD RIGHT FOREARM  Final   Special Requests   Final    BOTTLES DRAWN AEROBIC AND ANAEROBIC Blood Culture adequate volume   Culture  Setup Time   Final    GRAM POSITIVE COCCI IN CHAINS IN BOTH  AEROBIC AND ANAEROBIC BOTTLES Organism ID to follow CRITICAL RESULT CALLED TO, READ BACK BY AND VERIFIED WITH: EBurman Foster.D. 14:30 12/06/17 ([email or web address]) Performed at [facility], [place] [address] [place], [place] [address]    Culture GRAM POSITIVE COCCI  Final   Report Status PENDING  Incomplete  Blood Culture ID Panel (Reflexed)     Status: Abnormal   Collection Time: 12/05/17  9:01 PM  Result Value Ref Range Status   Enterococcus species NOT DETECTED NOT DETECTED Final   Listeria monocytogenes NOT DETECTED NOT DETECTED Final   Staphylococcus species NOT DETECTED NOT DETECTED Final   Staphylococcus aureus NOT DETECTED NOT DETECTED Final   Streptococcus species DETECTED (A) NOT DETECTED Final    Comment: Not Enterococcus species, Streptococcus agalactiae, Streptococcus pyogenes, or Streptococcus pneumoniae. CRITICAL RESULT CALLED TO, READ BACK BY AND VERIFIED WITH: EBurman Foster.D. 14:30 12/06/17 ([email or web address])    Streptococcus agalactiae NOT DETECTED NOT DETECTED Final   Streptococcus pneumoniae NOT DETECTED NOT  DETECTED Final   Streptococcus pyogenes NOT DETECTED NOT DETECTED Final   Acinetobacter baumannii NOT DETECTED NOT DETECTED Final   Enterobacteriaceae species NOT DETECTED NOT DETECTED Final   Enterobacter cloacae complex NOT DETECTED NOT DETECTED Final   Escherichia coli NOT DETECTED NOT DETECTED Final   Klebsiella oxytoca NOT DETECTED NOT DETECTED Final   Klebsiella pneumoniae NOT DETECTED NOT DETECTED Final   Proteus species NOT DETECTED NOT DETECTED Final   Serratia marcescens NOT DETECTED NOT DETECTED Final   Haemophilus influenzae NOT DETECTED NOT DETECTED Final   Neisseria meningitidis NOT DETECTED NOT DETECTED Final   Pseudomonas aeruginosa NOT DETECTED NOT DETECTED Final   Candida albicans NOT DETECTED NOT DETECTED Final   Candida glabrata NOT DETECTED NOT DETECTED Final   Candida krusei NOT DETECTED NOT DETECTED Final   Candida parapsilosis NOT DETECTED NOT DETECTED Final   Candida tropicalis NOT DETECTED NOT DETECTED Final    Comment: Performed at [facility], [place] [address] [place], [place] [address]  MRSA PCR Screening     Status: Abnormal   Collection Time: 12/06/17  1:20 AM  Result Value Ref Range Status   MRSA by PCR POSITIVE (A) NEGATIVE Final    Comment:        The GeneXpert MRSA Assay (FDA approved for NASAL specimens only), is one component of a comprehensive MRSA colonization surveillance program. It is not intended to diagnose MRSA infection nor to guide or monitor treatment for MRSA infections. RESULT CALLED TO, READ BACK BY AND VERIFIED WITH: SCOTT,B RN 12.7.18 @0515  ZANDO,C          Radiology Studies: Ct L-spine No Charge  Result Date: 12/05/2017 CLINICAL DATA:  Initial evaluation for acute lower back pain, recent fall. EXAM: CT LUMBAR SPINE WITHOUT CONTRAST TECHNIQUE: Multidetector CT imaging of the lumbar spine was performed without intravenous contrast administration. Multiplanar CT image reconstructions were also generated. COMPARISON:   Prior radiograph from 02/22/2014. FINDINGS: Segmentation: Normal segmentation. Lowest well-formed disc labeled the L5-S1 level. Alignment: Severe levoscoliosis with apex at L2-3. Exaggeration of the normal lumbar lordosis. Trace retrolisthesis of T12 on L1, L1 on L2, and L2 on L3, with trace anterolisthesis of L4 on L5. Vertebrae: Vertebral body heights maintained without evidence for acute or chronic vertebral body fracture. Visualized sacrum and pelvis intact. Fractures involving the left transverse processes of L2 (series 8, image 33) and L3 (series 8, image 46), somewhat age indeterminate, but favored to be at least subacute in nature. No discrete  lytic or blastic osseous lesions. Paraspinal and other soft tissues: Diffuse dependent edema present within the subcutaneous fat of the lower back. Chronic fatty atrophy noted within the posterior paraspinous musculature. Advanced aortic atherosclerosis. Visualized visceral structures grossly unremarkable. Disc levels: T12-L1: Diffuse disc desiccation with intervertebral disc space narrowing and reactive endplate changes. Mild facet hypertrophy. No significant stenosis. L1-2: Chronic intervertebral disc space narrowing with disc desiccation. Reactive endplate changes. Mild facet hypertrophy. No significant spinal stenosis. Moderate to severe right L1 foraminal narrowing. No significant left foraminal encroachment. L2-3: Diffuse disc bulge with disc desiccation. Severe right with moderate left facet arthrosis. Mild effacement of the right lateral recess without significant canal stenosis. Moderate right with mild left L2 foraminal narrowing. L3-4: Diffuse disc bulge with disc desiccation. Right extraforaminal chronic reactive endplate changes. Advanced facet arthrosis, right worse than left. Ligamentum flavum thickening. Mild canal with mild to moderate lateral recess narrowing, worse on the right. Moderate right L3 foraminal narrowing. L4-5: Diffuse disc bulge with  disc desiccation. Severe bilateral facet arthrosis with ligamentum flavum thickening. Resultant moderate to severe canal with bilateral lateral recess stenosis, right worse than left. Mild bilateral L4 foraminal narrowing. L5-S1: Chronic intervertebral disc space narrowing, greater on the left. Diffuse disc bulge with annular calcification. Severe facet arthrosis. No significant canal or lateral recess stenosis. Severe left L5 foraminal narrowing. IMPRESSION: 1. Minimally displaced fractures involving the left transverse processes of L2 and L3. Findings are somewhat age indeterminate, but suspected to at least be subacute in nature. 2. No other acute abnormality within the lumbar spine. 3. Severe levoscoliosis with associated multilevel degenerative spondylolysis and advanced facet arthrosis. Resultant moderate to severe spinal stenosis at L4-5. 4. Multifactorial degenerative changes with resultant multilevel foraminal narrowing as above, most notable at L1 and L2 on the right and L5 on the left. Electronically Signed   By: Jeannine Boga M.D.   On: 12/05/2017 20:18   Dg Chest Port 1 View  Result Date: 12/05/2017 CLINICAL DATA:  Shortness of Breath EXAM: PORTABLE CHEST 1 VIEW COMPARISON:  10/16/2017 FINDINGS: Cardiac shadow remains enlarged. Postsurgical changes are again seen. Central vascular congestion is noted with mild interstitial edema. No sizable effusion or infiltrate is seen. No bony abnormality is noted. IMPRESSION: Mild CHF. Electronically Signed   By: Inez Catalina M.D.   On: 12/05/2017 21:28   Ct Renal Stone Study  Result Date: 12/05/2017 CLINICAL DATA:  Low back pain post fall 3 weeks ago. EXAM: CT ABDOMEN AND PELVIS WITHOUT CONTRAST TECHNIQUE: Multidetector CT imaging of the abdomen and pelvis was performed following the standard protocol without IV contrast. COMPARISON:  Body CT 05/02/2011 FINDINGS: Lower chest: Small bilateral pleural effusions. Marked cardiomegaly. Calcific  atherosclerotic disease of the coronary arteries and aorta. Mitral valve calcifications. Large hiatal hernia. Hepatobiliary: The liver contour is nodular with enlargement of the lateral segment, suggestive of possible liver cirrhosis. The gallbladder is normal. There is a 2.1 cm indeterminate hypoattenuated mass within the right dome of the liver. Second smaller 9 mm mass is seen inferiorly. Pancreas: Unremarkable. No pancreatic ductal dilatation or surrounding inflammatory changes. Spleen: Normal in size without focal abnormality. Adrenals/Urinary Tract: Adrenal glands are unremarkable. Kidneys are normal, without renal calculi, focal lesion, or hydronephrosis. Bladder is unremarkable. Stomach/Bowel: Stomach is fluid-filled, normally distended. Duodenal diverticulum again noted. There are borderline enlarged small bowels in the left central abdomen measuring up to 3.3 cm in cross-section, with associated smooth mucosal thickening. Vascular/Lymphatic: Aortic atherosclerosis. No enlarged abdominal or pelvic lymph nodes. Diffuse  mesenteric edema. Reproductive: Again seen is a right adnexal thick-walled cystic lesion, which measures 1.7 cm in diameter. Other: Low-density abdominal and pelvic ascites. Transverse gas bubble in the lower central abdomen seen on image 64/89 is noted, and favored to represent intraluminal gas. Musculoskeletal: No fracture is seen. Lumbosacral spine scoliosis with extensive osteoarthritic changes. Abdominal wall edema. Small amount of gas within the musculature of the right buttock may be post injection. IMPRESSION: Study degraded by motion artifact and lack of IV contrast. No evidence of acute traumatic injury to the abdomen or pelvis. Possible liver cirrhosis with moderate amount of ascites within the abdomen and pelvis. Two indeterminate liver masses. Primary hepatocellular carcinoma or metastatic lesions cannot be excluded. Borderline enlargement of small bowel loops in the left abdomen  with diffuse mucosal thickening. This may represent enteritis with mild ileus. Small bilateral pleural effusions. Marked cardiomegaly. Calcific atherosclerotic disease of the aorta and coronary arteries. Thick-walled cystic mass in the right adnexa, which has not increased in size from 2012 and therefore is either benign or represents an indolent slow growing malignancy. Electronically Signed   By: Fidela Salisbury M.D.   On: 12/05/2017 20:16        Scheduled Meds: . Chlorhexidine Gluconate Cloth  6 each Topical Q0600  . collagenase   Topical Daily  . furosemide  20 mg Intravenous Daily  . insulin aspart  0-9 Units Subcutaneous TID WC  . mupirocin ointment  1 application Nasal BID  . pantoprazole (PROTONIX) IV  40 mg Intravenous Q12H  . warfarin  2.5 mg Oral ONCE-1800  . Warfarin - Pharmacist Dosing Inpatient   Does not apply q1800   Continuous Infusions: . sodium chloride 100 mL/hr at 12/06/17 0549  . cefTRIAXone (ROCEPHIN)  IV       LOS: 1 day    Time spent: 35 minutes.     Hosie Poisson, MD [facility] Pager [phone]  If 7PM-7AM, please contact night-coverage [email or web address] Password [email or web address] 12/06/2017, 4:14 PM

## 2017-12-06 NOTE — Progress Notes (Signed)
Patient has multiple toe wounds. Unable to assess due to necrosis/extra layers of skin. Wound care consult.

## 2017-12-06 NOTE — Progress Notes (Signed)
Pharmacy Antibiotic Note  [name] is a 83 y.o. female admitted on 12/05/2017 with sepsis.  Pharmacy has been consulted for Vancomycin and Zosyn dosing.  Plan: Zosyn 3.375g IV q8h (4 hour infusion).  Vancomycin 1.5gm iv x1, then 1gm iv q36hr  Goal AUC = 400 - 500 for all indications, except meningitis (goal AUC > 500 and Cmin 15-20 mcg/mL)   Height: 5\' 10"  (177.8 cm) Weight: 173 lb 11.6 oz (78.8 kg) IBW/kg (Calculated) : 68.5  Temp (24hrs), Avg:99.5 F (37.5 C), Min:97.9 F (36.6 C), Max:102.1 F (38.9 C)  Recent Labs  Lab 12/02/17 12/05/17 1845 12/05/17 2023 12/05/17 2241 12/06/17 0135  WBC  --  21.7*  --   --  31.3*  CREATININE 1.61 1.45*  --   --  1.63*  LATICACIDVEN  --   --  2.02* 1.59  --     Estimated Creatinine Clearance: 28.8 mL/min (A) (by C-G formula based on SCr of 1.63 mg/dL (H)).    Allergies  Allergen Reactions  . Ace Inhibitors Other (See Comments)    Intolerance per Dr. Doug Sou note  . Amlodipine Other (See Comments)    Intolerance per Dr. Doug Sou note   . Statins Other (See Comments)    Muscle soreness  . Sulfa Drugs Cross Reactors Itching  . Zetia [Ezetimibe] Other (See Comments)    Muscle soreness  . Fenofibrate Other (See Comments)    Muscle soreness    Antimicrobials this admission: Vancomycin 12/05/2017 >> Zosyn 12/05/2017 >>  Dose adjustments this admission: -  Microbiology results: pending  Thank you for allowing pharmacy to be a part of this patient's care.  Nani Skillern Crowford 12/06/2017 5:45 AM

## 2017-12-06 NOTE — Progress Notes (Signed)
PHARMACY - PHYSICIAN COMMUNICATION CRITICAL VALUE ALERT - BLOOD CULTURE IDENTIFICATION (BCID)  [name] is an 83 y.o. female who presented to [facility]2017 with a chief complaint of chills, back pain, Sepsis 2/2 cellulitis and abscess.  Assessment:  Right thigh abscess s/p I&D in ED. Tm 102.1 on admission, Tc 98.3 SCr elevated and increased to 1.63, CrCl ~ 28 ml/min  Name of physician (or Provider) Contacted: Dr Karleen Hampshire  Current antibiotics: Vancomycin and Zosyn  Changes to prescribed antibiotics recommended:  Ceftriaxone 2 GM IV q24h Recommendations accepted by provider  Results for orders placed or performed during the hospital encounter of 12/05/17  Blood Culture ID Panel (Reflexed) (Collected: 12/05/2017  9:01 PM)  Result Value Ref Range   Enterococcus species NOT DETECTED NOT DETECTED   Listeria monocytogenes NOT DETECTED NOT DETECTED   Staphylococcus species NOT DETECTED NOT DETECTED   Staphylococcus aureus NOT DETECTED NOT DETECTED   Streptococcus species DETECTED (A) NOT DETECTED   Streptococcus agalactiae NOT DETECTED NOT DETECTED   Streptococcus pneumoniae NOT DETECTED NOT DETECTED   Streptococcus pyogenes NOT DETECTED NOT DETECTED   Acinetobacter baumannii NOT DETECTED NOT DETECTED   Enterobacteriaceae species NOT DETECTED NOT DETECTED   Enterobacter cloacae complex NOT DETECTED NOT DETECTED   Escherichia coli NOT DETECTED NOT DETECTED   Klebsiella oxytoca NOT DETECTED NOT DETECTED   Klebsiella pneumoniae NOT DETECTED NOT DETECTED   Proteus species NOT DETECTED NOT DETECTED   Serratia marcescens NOT DETECTED NOT DETECTED   Haemophilus influenzae NOT DETECTED NOT DETECTED   Neisseria meningitidis NOT DETECTED NOT DETECTED   Pseudomonas aeruginosa NOT DETECTED NOT DETECTED   Candida albicans NOT DETECTED NOT DETECTED   Candida glabrata NOT DETECTED NOT DETECTED   Candida krusei NOT DETECTED NOT DETECTED   Candida parapsilosis NOT DETECTED NOT DETECTED    Candida tropicalis NOT DETECTED NOT DETECTED    Gretta Arab PharmD, BCPS Pager [phone] 12/06/2017 2:55 PM
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
## 2017-12-07 LAB — CBC WITH DIFFERENTIAL/PLATELET
BASOS ABS: 0 10*3/uL (ref 0.0–0.1)
BASOS PCT: 0 %
Eosinophils Absolute: 0.1 10*3/uL (ref 0.0–0.7)
Eosinophils Relative: 0 %
HEMATOCRIT: 33 % — AB (ref 36.0–46.0)
HEMOGLOBIN: 10.3 g/dL — AB (ref 12.0–15.0)
LYMPHS PCT: 3 %
Lymphs Abs: 0.6 10*3/uL — ABNORMAL LOW (ref 0.7–4.0)
MCH: 26.1 pg (ref 26.0–34.0)
MCHC: 31.2 g/dL (ref 30.0–36.0)
MCV: 83.8 fL (ref 78.0–100.0)
Monocytes Absolute: 1.4 10*3/uL — ABNORMAL HIGH (ref 0.1–1.0)
Monocytes Relative: 6 %
NEUTROS ABS: 19.3 10*3/uL — AB (ref 1.7–7.7)
NEUTROS PCT: 91 %
Platelets: 236 10*3/uL (ref 150–400)
RBC: 3.94 MIL/uL (ref 3.87–5.11)
RDW: 19 % — ABNORMAL HIGH (ref 11.5–15.5)
WBC: 21.3 10*3/uL — ABNORMAL HIGH (ref 4.0–10.5)

## 2017-12-07 LAB — BASIC METABOLIC PANEL
ANION GAP: 10 (ref 5–15)
BUN: 58 mg/dL — ABNORMAL HIGH (ref 6–20)
CHLORIDE: 102 mmol/L (ref 101–111)
CO2: 20 mmol/L — AB (ref 22–32)
Calcium: 8.3 mg/dL — ABNORMAL LOW (ref 8.9–10.3)
Creatinine, Ser: 1.7 mg/dL — ABNORMAL HIGH (ref 0.44–1.00)
GFR calc non Af Amer: 27 mL/min — ABNORMAL LOW (ref >60)
GFR, EST AFRICAN AMERICAN: 31 mL/min — AB (ref >60)
Glucose, Bld: 115 mg/dL — ABNORMAL HIGH (ref 65–99)
POTASSIUM: 3.8 mmol/L (ref 3.5–5.1)
Sodium: 132 mmol/L — ABNORMAL LOW (ref 135–145)

## 2017-12-07 LAB — GLUCOSE, CAPILLARY
GLUCOSE-CAPILLARY: 118 mg/dL — AB (ref 65–99)
Glucose-Capillary: 111 mg/dL — ABNORMAL HIGH (ref 65–99)

## 2017-12-07 LAB — PROTIME-INR
INR: 3.22
Prothrombin Time: 32.6 seconds — ABNORMAL HIGH (ref 11.4–15.2)

## 2017-12-07 LAB — LACTIC ACID, PLASMA: Lactic Acid, Venous: 1.1 mmol/L (ref 0.5–1.9)

## 2017-12-07 MED ORDER — SODIUM CHLORIDE 0.9 % IV BOLUS (SEPSIS)
1000.0000 mL | Freq: Once | INTRAVENOUS | Status: AC
  Administered 2017-12-07: 1000 mL via INTRAVENOUS

## 2017-12-07 MED ORDER — LIP MEDEX EX OINT
TOPICAL_OINTMENT | CUTANEOUS | Status: AC
  Administered 2017-12-07: 15:00:00
  Filled 2017-12-07: qty 7

## 2017-12-07 MED ORDER — LIP MEDEX EX OINT
TOPICAL_OINTMENT | CUTANEOUS | Status: DC | PRN
  Filled 2017-12-07: qty 7

## 2017-12-07 MED ORDER — SODIUM CHLORIDE 0.9 % IV BOLUS (SEPSIS)
500.0000 mL | Freq: Once | INTRAVENOUS | Status: AC
  Administered 2017-12-07: 500 mL via INTRAVENOUS

## 2017-12-07 NOTE — Progress Notes (Signed)
ANTICOAGULATION CONSULT NOTE - Follow Up Consult  Pharmacy Consult for Warfarin Indication: atrial fibrillation  Allergies  Allergen Reactions  . Ace Inhibitors Other (See Comments)    Intolerance per Dr. Doug Sou note  . Amlodipine Other (See Comments)    Intolerance per Dr. Doug Sou note   . Statins Other (See Comments)    Muscle soreness  . Sulfa Drugs Cross Reactors Itching  . Zetia [Ezetimibe] Other (See Comments)    Muscle soreness  . Fenofibrate Other (See Comments)    Muscle soreness    Patient Measurements: Height: 5\' 10"  (177.8 cm) Weight: 182 lb 15.7 oz (83 kg) IBW/kg (Calculated) : 68.5  Vital Signs: Temp: 98.4 F (36.9 C) (12/08 0800) Temp Source: Oral (12/08 0800) BP: 96/46 (12/08 1000) Pulse Rate: 87 (12/08 1000)  Labs: Recent Labs    12/05/17 1845 12/05/17 1853 12/06/17 0135 12/06/17 0620 12/07/17 0321 12/07/17 0841  HGB 12.1  --  10.5*  --   --  10.3*  HCT 37.2  --  33.0*  --   --  33.0*  PLT 349  --  265  --   --  236  LABPROT  --  24.4*  --  26.6* 32.6*  --   INR  --  2.22  --  2.47 3.22  --   CREATININE 1.45*  --  1.63*  --   --  1.70*  TROPONINI  --   --  1.89*  --   --   --     Estimated Creatinine Clearance: 29.9 mL/min (A) (by C-G formula based on SCr of 1.7 mg/dL (H)).   Medications:  Scheduled:  . Chlorhexidine Gluconate Cloth  6 each Topical Q0600  . collagenase   Topical Daily  . furosemide  20 mg Intravenous Daily  . insulin aspart  0-9 Units Subcutaneous TID WC  . mupirocin ointment  1 application Nasal BID  . pantoprazole (PROTONIX) IV  40 mg Intravenous Q12H  . Warfarin - Pharmacist Dosing Inpatient   Does not apply q1800   Infusions:  . cefTRIAXone (ROCEPHIN)  IV Stopped (12/07/17 0029)  . sodium chloride      Assessment: 73 yoF presented from [place] on 12/6 with chills and back pain.  PMH includes chronic anticoagulation on warfarin for afib.    PTA warfarin dose warfarin per NH note is 2.5mg  po daily,  except 5mg  on Monday, Wednesday, Friday.  But per Med history is listed as 3.5 mg (2.5mg  tablet + 1 mg tablet) on Sunday, Tuesday, Thursday, Saturday. Admission INR is therapeutic at 2.22  Clarified warfarin dosing: 3.5mg  daily except 5mg  Mon/Wed/Fri  Today, 12/07/2017: INR 3.22, SUPRAtherapeutic CBC: Hgb decreased to 10.3, Plt remain WNL No bleeding or complications reported. Diet: Regular No major drug-drug interactions identified.  Broad spectrum antibiotics may increase INR.   Goal of Therapy:  INR 2-3 Monitor platelets by anticoagulation protocol: Yes   Plan:   Hold warfarin today.  Daily PT/INR.  Monitor for signs and symptoms of bleeding.  Peggyann Juba, PharmD, BCPS Pager: [phone] 12/07/2017 11:56 AM

## 2017-12-07 NOTE — Progress Notes (Signed)
PROGRESS NOTE    [name]  KGY:[id number] DOB: [birth year] DOA: 12/05/2017 PCP: [name], MD    Brief Narrative:  [name] is a 83 y.o. female with medical history significant of CAD, diastolic CHF, pulmonary HTN, NSTEMI, HTN and atrial fibrillationon coumadin; reports having acute onset of chills and lower back pain, . She was admitted for sepsis of unclear etiology.      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 Admitted to stepdown for hypotension, closer monitoring. Patient's blood pressure improved her map is greater than 65.  She was started on broad-spectrum antibiotics with vancomycin and cefepime.  As her blood cultures showed Streptococcus we have narrowed her antibiotic to Rocephin.  Wound care consulted for bilateral lower extremity ulcers and recommendations given.  Possible enteritis with ileus seen on CT abdomen pelvis Clear liquid diet.  She denies any nausea vomiting or abdominal pain at this time. Advance diet as tolerated.    Hypotension Resolved   Chronic atrial fibrillation with RVR Rates better after hydration On chronic anticoagulation with Coumadin.    Lumbar fractures, age indeterminate: Probably secondary to the fall few weeks ago.  Pain control and physical therapy evaluation.   Mild diastolic heart failure acute on chronic Chest x-ray showed mild CHF she is got bilateral pedal edema and is requiring oxygen at this time. Resume Lasix as needed and if her bp tolerated.    Elevated troponins Probably from demand ischemia from sepsis.  Overnight physician spoke with Dr. Percival Spanish and  recommended no  ischemic workup at this time.  Patient denies any chest pain shortness of breath or presyncope.   Type 2 diabetes mellitus Last A1c is 5.5, holding metformin.  Check CBGs with sensitive scale insulin.   History of chronic venous stasis ulcerations Wound care consulted and recommendations given   Possible hepatocellular carcinoma No workup as per the family as per the daughter at bedside.    View of her multiple medical problems and admission for sepsis if patient does not improve clinically would warrant a palliative care consult and possible transition to comfort care.    DVT prophylaxis: COUMADIN.  Code Status: DNR Family Communication: none at bedside, discussed with daughter over the phone Disposition Plan: pending further evaluation.    Consultants:  SLP Wound care Phone consult with cardiology.   Procedures: none.   Antimicrobials: vancomycin and cefepiem since admission later narrowed to rocephin for streptococcus.    Subjective: Appears comfortable denies any new complaints.  Reports she had a good breakfast denies any new complaints. Objective: Vitals:   12/07/17 1100 12/07/17 1200 12/07/17 1300 12/07/17 1400  BP:  (!) 103/54  (!) 88/46  Pulse: 96 77 100 76  Resp: (!) 21 16    Temp:  98.5 F (36.9 C)    TempSrc:  Oral    SpO2: 96% 97% 98% 99%  Weight:      Height:        Intake/Output Summary (Last 24 hours) at 12/07/2017 1624 Last data filed at 12/07/2017 1422 Gross per 24 hour  Intake 701.67 ml  Output 1150 ml  Net -448.33 ml   [employer]  12/05/17 1823 12/06/17 0420 12/07/17 0500  Weight: 76.2 kg (168 lb) 78.8 kg (173 lb 11.6 oz) 83 kg (182 lb 15.7 oz)    Examination: No change in exam.  General exam: Appears calm and comfortable on 3 lit of [place] oxygen.  Respiratory system: basilar crackles, no wheezing or rhonchi.  Cardiovascular system: S1 & S2 heard, RRR. No JVD, murmurs, rubs, gallops or clicks. No pedal  edema. Gastrointestinal system: Abdomen is nondistended, soft and nontender. No organomegaly or masses felt. Normal bowel sounds heard. Central nervous system: Alert and oriented. No focal neurological deficits. Extremities: left lower extremity swelling with left pretibial lateral wound is 2cm x 0.8cm x 0.1cm Skin: No rashes, lesions or ulcers Psychiatry:  Mood & affect appropriate.     Data Reviewed: I have personally reviewed following labs and imaging studies  CBC: Recent Labs  Lab 12/05/17 1845 12/06/17 0135 12/07/17 0841  WBC 21.7* 31.3* 21.3*  NEUTROABS 20.4* 29.1* 19.3*  HGB 12.1 10.5* 10.3*  HCT 37.2 33.0* 33.0*  MCV 82.5 82.7 83.8  PLT 349 265 809   Basic Metabolic Panel: Recent Labs  Lab 12/02/17 12/05/17 1845 12/06/17 0135 12/07/17 0841  NA 134 133* 134* 132*  K 3.5 4.1 4.3 3.8  CL  --  93* 99* 102  CO2  --  25 23 20*  GLUCOSE  --  140* 107* 115*  BUN 66* 59* 56* 58*  CREATININE 1.61 1.45* 1.63* 1.70*  CALCIUM 8.6 9.0 7.9* 8.3*   GFR: Estimated Creatinine Clearance: 29.9 mL/min (A) (by C-G formula based on SCr of 1.7 mg/dL (H)). Liver Function Tests: Recent Labs  Lab 12/06/17 0135  AST 55*  ALT 19  ALKPHOS 100  BILITOT 1.7*  PROT 6.0*  ALBUMIN 2.7*   No results for input(s): LIPASE, AMYLASE in the last 168 hours. No results for input(s): AMMONIA in the last 168 hours. Coagulation Profile: Recent Labs  Lab 12/05/17 1853 12/06/17 0620 12/07/17 0321  INR 2.22 2.47 3.22   Cardiac Enzymes: Recent Labs  Lab 12/06/17 0135  TROPONINI 1.89*   BNP (last 3 results) No results for input(s): PROBNP in the last 8760 hours. HbA1C: No results for input(s): HGBA1C in the last 72 hours. CBG: Recent Labs  Lab 12/06/17 0759 12/06/17 1729 12/06/17 2203 12/07/17 0743 12/07/17 1220  GLUCAP 95 96 151* 111* 118*   Lipid Profile: No results for input(s): CHOL, HDL, LDLCALC, TRIG, CHOLHDL, LDLDIRECT in the last 72 hours. Thyroid Function  Tests: Recent Labs    12/06/17 0135  TSH 14.674*  FREET4 0.87   Anemia Panel: No results for input(s): VITAMINB12, FOLATE, FERRITIN, TIBC, IRON, RETICCTPCT in the last 72 hours. Sepsis Labs: Recent Labs  Lab 12/05/17 2023 12/05/17 2241 12/07/17 0841  LATICACIDVEN 2.02* 1.59 1.1    Recent Results (from the past 240 hour(s))  Blood culture (routine x 2)     Status: Abnormal (Preliminary result)   Collection Time: 12/05/17  8:56 PM  Result Value Ref Range Status   Specimen Description BLOOD LEFT ANTECUBITAL  Final   Special Requests   Final    BOTTLES DRAWN AEROBIC AND ANAEROBIC Blood Culture adequate volume   Culture  Setup Time   Final    GRAM POSITIVE COCCI IN CHAINS ANAEROBIC BOTTLE ONLY CRITICAL VALUE NOTED.  VALUE IS CONSISTENT WITH PREVIOUSLY REPORTED AND CALLED VALUE.    Culture (A)  Final    STREPTOCOCCUS SPECIES IDENTIFICATION TO FOLLOW Performed at [facility], [place],  [place] [address]    Report Status PENDING  Incomplete  Blood culture (routine x 2)     Status: Abnormal (Preliminary result)   Collection Time: 12/05/17  9:01 PM  Result Value Ref Range Status   Specimen Description BLOOD BLOOD RIGHT FOREARM  Final   Special Requests   Final    BOTTLES DRAWN AEROBIC AND ANAEROBIC Blood Culture adequate volume   Culture  Setup Time   Final    GRAM POSITIVE COCCI IN CHAINS IN BOTH AEROBIC AND ANAEROBIC BOTTLES CRITICAL RESULT CALLED TO, READ BACK BY AND VERIFIED WITH: EBurman Foster.D. 14:30 12/06/17 ([email or web address])    Culture (A)  Final    STREPTOCOCCUS SPECIES IDENTIFICATION AND SUSCEPTIBILITIES TO FOLLOW Performed at [facility], [place] [address], [place], [place] [address]    Report Status PENDING  Incomplete  Blood Culture ID Panel (Reflexed)     Status: Abnormal   Collection Time: 12/05/17  9:01 PM  Result Value Ref Range Status   Enterococcus species NOT DETECTED NOT DETECTED Final   Listeria monocytogenes NOT DETECTED NOT  DETECTED Final   Staphylococcus species NOT DETECTED NOT DETECTED Final   Staphylococcus aureus NOT DETECTED NOT DETECTED Final   Streptococcus species DETECTED (A) NOT DETECTED Final    Comment: Not Enterococcus species, Streptococcus agalactiae, Streptococcus pyogenes, or Streptococcus pneumoniae. CRITICAL RESULT CALLED TO, READ BACK BY AND VERIFIED WITH: EBurman Foster.D. 14:30 12/06/17 ([email or web address])    Streptococcus agalactiae NOT DETECTED NOT DETECTED Final   Streptococcus pneumoniae NOT DETECTED NOT DETECTED Final   Streptococcus pyogenes NOT DETECTED NOT DETECTED Final   Acinetobacter baumannii NOT DETECTED NOT DETECTED Final   Enterobacteriaceae species NOT DETECTED NOT DETECTED Final   Enterobacter cloacae complex NOT DETECTED NOT DETECTED Final   Escherichia coli NOT DETECTED NOT DETECTED Final   Klebsiella oxytoca NOT DETECTED NOT DETECTED Final   Klebsiella pneumoniae NOT DETECTED NOT DETECTED Final   Proteus species NOT DETECTED NOT DETECTED Final   Serratia marcescens NOT DETECTED NOT DETECTED Final   Haemophilus influenzae NOT DETECTED NOT DETECTED Final   Neisseria meningitidis NOT DETECTED NOT DETECTED Final   Pseudomonas aeruginosa NOT DETECTED NOT DETECTED Final   Candida albicans NOT DETECTED NOT DETECTED Final   Candida glabrata NOT DETECTED NOT DETECTED Final   Candida krusei NOT DETECTED NOT DETECTED Final   Candida parapsilosis NOT DETECTED NOT DETECTED Final   Candida tropicalis NOT DETECTED NOT DETECTED Final    Comment: Performed at [facility], [place] [address], [place], [place] [address]  MRSA PCR Screening     Status: Abnormal   Collection Time: 12/06/17  1:20 AM  Result Value Ref Range Status   MRSA by PCR POSITIVE (A) NEGATIVE Final    Comment:        The GeneXpert MRSA Assay (FDA approved for NASAL specimens only), is one component of a comprehensive MRSA colonization surveillance program. It is not intended to diagnose MRSA infection nor to  guide or monitor treatment for MRSA infections. RESULT CALLED TO, READ BACK BY AND VERIFIED WITH: SCOTT,B RN 12.7.18 @0515  ZANDO,C          Radiology Studies: Ct L-spine No Charge  Result Date: 12/05/2017 CLINICAL DATA:  Initial evaluation for acute lower back pain, recent fall. EXAM: CT LUMBAR SPINE WITHOUT CONTRAST TECHNIQUE: Multidetector CT imaging of the lumbar spine was performed without intravenous contrast administration. Multiplanar CT image reconstructions were also generated. COMPARISON:  Prior radiograph from 02/22/2014. FINDINGS: Segmentation: Normal segmentation.  Lowest well-formed disc labeled the L5-S1 level. Alignment: Severe levoscoliosis with apex at L2-3. Exaggeration of the normal lumbar lordosis. Trace retrolisthesis of T12 on L1, L1 on L2, and L2 on L3, with trace anterolisthesis of L4 on L5. Vertebrae: Vertebral body heights maintained without evidence for acute or chronic vertebral body fracture. Visualized sacrum and pelvis intact. Fractures involving the left transverse processes of L2 (series 8, image 33) and L3 (series 8, image 46), somewhat age indeterminate, but favored to be at least subacute in nature. No discrete lytic or blastic osseous lesions. Paraspinal and other soft tissues: Diffuse dependent edema present within the subcutaneous fat of the lower back. Chronic fatty atrophy noted within the posterior paraspinous musculature. Advanced aortic atherosclerosis. Visualized visceral structures grossly unremarkable. Disc levels: T12-L1: Diffuse disc desiccation with intervertebral disc space narrowing and reactive endplate changes. Mild facet hypertrophy. No significant stenosis. L1-2: Chronic intervertebral disc space narrowing with disc desiccation. Reactive endplate changes. Mild facet hypertrophy. No significant spinal stenosis. Moderate to severe right L1 foraminal narrowing. No significant left foraminal encroachment. L2-3: Diffuse disc bulge with disc  desiccation. Severe right with moderate left facet arthrosis. Mild effacement of the right lateral recess without significant canal stenosis. Moderate right with mild left L2 foraminal narrowing. L3-4: Diffuse disc bulge with disc desiccation. Right extraforaminal chronic reactive endplate changes. Advanced facet arthrosis, right worse than left. Ligamentum flavum thickening. Mild canal with mild to moderate lateral recess narrowing, worse on the right. Moderate right L3 foraminal narrowing. L4-5: Diffuse disc bulge with disc desiccation. Severe bilateral facet arthrosis with ligamentum flavum thickening. Resultant moderate to severe canal with bilateral lateral recess stenosis, right worse than left. Mild bilateral L4 foraminal narrowing. L5-S1: Chronic intervertebral disc space narrowing, greater on the left. Diffuse disc bulge with annular calcification. Severe facet arthrosis. No significant canal or lateral recess stenosis. Severe left L5 foraminal narrowing. IMPRESSION: 1. Minimally displaced fractures involving the left transverse processes of L2 and L3. Findings are somewhat age indeterminate, but suspected to at least be subacute in nature. 2. No other acute abnormality within the lumbar spine. 3. Severe levoscoliosis with associated multilevel degenerative spondylolysis and advanced facet arthrosis. Resultant moderate to severe spinal stenosis at L4-5. 4. Multifactorial degenerative changes with resultant multilevel foraminal narrowing as above, most notable at L1 and L2 on the right and L5 on the left. Electronically Signed   By: Jeannine Boga M.D.   On: 12/05/2017 20:18   Dg Chest Port 1 View  Result Date: 12/05/2017 CLINICAL DATA:  Shortness of Breath EXAM: PORTABLE CHEST 1 VIEW COMPARISON:  10/16/2017 FINDINGS: Cardiac shadow remains enlarged. Postsurgical changes are again seen. Central vascular congestion is noted with mild interstitial edema. No sizable effusion or infiltrate is seen. No  bony abnormality is noted. IMPRESSION: Mild CHF. Electronically Signed   By: Inez Catalina M.D.   On: 12/05/2017 21:28   Ct Renal Stone Study  Result Date: 12/05/2017 CLINICAL DATA:  Low back pain post fall 3 weeks ago. EXAM: CT ABDOMEN AND PELVIS WITHOUT CONTRAST TECHNIQUE: Multidetector CT imaging of the abdomen and pelvis was performed following the standard protocol without IV contrast. COMPARISON:  Body CT 05/02/2011 FINDINGS: Lower chest: Small bilateral pleural effusions. Marked cardiomegaly. Calcific atherosclerotic disease of the coronary arteries and aorta. Mitral valve calcifications. Large hiatal hernia. Hepatobiliary: The liver contour is nodular with enlargement of the lateral segment, suggestive of possible liver cirrhosis. The gallbladder is normal. There is a 2.1 cm indeterminate hypoattenuated mass within the right dome  of the liver. Second smaller 9 mm mass is seen inferiorly. Pancreas: Unremarkable. No pancreatic ductal dilatation or surrounding inflammatory changes. Spleen: Normal in size without focal abnormality. Adrenals/Urinary Tract: Adrenal glands are unremarkable. Kidneys are normal, without renal calculi, focal lesion, or hydronephrosis. Bladder is unremarkable. Stomach/Bowel: Stomach is fluid-filled, normally distended. Duodenal diverticulum again noted. There are borderline enlarged small bowels in the left central abdomen measuring up to 3.3 cm in cross-section, with associated smooth mucosal thickening. Vascular/Lymphatic: Aortic atherosclerosis. No enlarged abdominal or pelvic lymph nodes. Diffuse mesenteric edema. Reproductive: Again seen is a right adnexal thick-walled cystic lesion, which measures 1.7 cm in diameter. Other: Low-density abdominal and pelvic ascites. Transverse gas bubble in the lower central abdomen seen on image 64/89 is noted, and favored to represent intraluminal gas. Musculoskeletal: No fracture is seen. Lumbosacral spine scoliosis with extensive  osteoarthritic changes. Abdominal wall edema. Small amount of gas within the musculature of the right buttock may be post injection. IMPRESSION: Study degraded by motion artifact and lack of IV contrast. No evidence of acute traumatic injury to the abdomen or pelvis. Possible liver cirrhosis with moderate amount of ascites within the abdomen and pelvis. Two indeterminate liver masses. Primary hepatocellular carcinoma or metastatic lesions cannot be excluded. Borderline enlargement of small bowel loops in the left abdomen with diffuse mucosal thickening. This may represent enteritis with mild ileus. Small bilateral pleural effusions. Marked cardiomegaly. Calcific atherosclerotic disease of the aorta and coronary arteries. Thick-walled cystic mass in the right adnexa, which has not increased in size from 2012 and therefore is either benign or represents an indolent slow growing malignancy. Electronically Signed   By: Fidela Salisbury M.D.   On: 12/05/2017 20:16        Scheduled Meds: . lip balm      . Chlorhexidine Gluconate Cloth  6 each Topical Q0600  . collagenase   Topical Daily  . furosemide  20 mg Intravenous Daily  . insulin aspart  0-9 Units Subcutaneous TID WC  . mupirocin ointment  1 application Nasal BID  . pantoprazole (PROTONIX) IV  40 mg Intravenous Q12H  . Warfarin - Pharmacist Dosing Inpatient   Does not apply q1800   Continuous Infusions: . cefTRIAXone (ROCEPHIN)  IV Stopped (12/07/17 0029)     LOS: 2 days    Time spent: 35 minutes.     Hosie Poisson, MD [facility] Pager [phone]  If 7PM-7AM, please contact night-coverage [email or web address] Password [facility] 12/07/2017, 4:24 PM

## 2017-12-07 NOTE — Consult Note (Signed)
[place] Nurse wound follow up Wound type:abscess I&D right medial thigh Measurement:na Wound bed:na Drainage (amount, consistency, odor) none Periwound: erythema, indurated, warm to touch, painful Dressing procedure/placement/frequency: I originally saw pt 12/7 AM and returned this morning to assess I&D site on right medial thigh. I had planned to pack I&D site with Iodoform gauze on original consult visit but site had closed. On recheck of site the redness, swelling, and warmth have increased. Iodoform placed at bedside in case I&D is performed again, wound can be packed immediately after draining. We will not follow, but will remain available to this patient, to nursing, and the medical and/or surgical teams.  Please re-consult if we need to assist further.   Fara Olden, RN-C, WTA-C Wound Treatment Associate

## 2017-12-07 NOTE — Progress Notes (Addendum)
Pt's BP 69/54. Pt asymptomatic. Paged Donnal Debar, NP. New order placed for 1 liter bolus over 2 hours. Continue to monitor.

## 2017-12-07 NOTE — Progress Notes (Signed)
Patient's BP dropped to 59/39 at 2110. Patient was asymptomatic. Paged on-call provider. New order for 1 liter bolus over 2 hours. Continue to monitor.

## 2017-12-07 NOTE — Plan of Care (Signed)
  Pain Managment: General experience of comfort will improve 12/07/2017 2039 - Progressing by Mickie Kay, RN

## 2017-12-08 LAB — GLUCOSE, CAPILLARY
GLUCOSE-CAPILLARY: 124 mg/dL — AB (ref 65–99)
GLUCOSE-CAPILLARY: 122 mg/dL — AB (ref 65–99)
Glucose-Capillary: 138 mg/dL — ABNORMAL HIGH (ref 65–99)
Glucose-Capillary: 139 mg/dL — ABNORMAL HIGH (ref 65–99)
Glucose-Capillary: 82 mg/dL (ref 65–99)

## 2017-12-08 LAB — CULTURE, BLOOD (ROUTINE X 2)
SPECIAL REQUESTS: ADEQUATE
Special Requests: ADEQUATE

## 2017-12-08 LAB — PROTIME-INR
INR: 3.5
Prothrombin Time: 34.8 seconds — ABNORMAL HIGH (ref 11.4–15.2)

## 2017-12-08 MED ORDER — FUROSEMIDE 20 MG PO TABS
20.0000 mg | ORAL_TABLET | Freq: Every day | ORAL | Status: DC
  Administered 2017-12-08 – 2017-12-10 (×3): 20 mg via ORAL
  Filled 2017-12-08 (×3): qty 1

## 2017-12-08 NOTE — Progress Notes (Signed)
PROGRESS NOTE    [name]  BTD:[id number] DOB: August 30, 1933 DOA: 12/05/2017 PCP: [name], MD    Brief Narrative:  [name] is a 83 y.o. female with medical history significant of CAD, diastolic CHF, pulmonary HTN, NSTEMI, HTN and atrial fibrillationon coumadin; reports having acute onset of chills and lower back pain, . She was admitted for sepsis of unclear etiology.      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 Admitted to stepdown for hypotension, closer monitoring. Patient's blood pressure improved , keep her map her map is greater than 65.  She was started on broad-spectrum antibiotics with vancomycin and cefepime.  As her blood cultures showed Streptococcus we have narrowed her antibiotic to Rocephin.  Wound care consulted for bilateral lower extremity ulcers and recommendations given. She continues to do better.  She remains afebrile leukocytosis is improving.  Her lactic acid is normal at 1.1.  Possible enteritis with ileus seen on CT abdomen pelvis Clear liquid diet.  She denies any nausea vomiting or abdominal pain at this time. Advance diet as tolerated.  Currently she is on regular diet and is able to tolerate.   Hypotension Resolved   Chronic atrial fibrillation with RVR Rates better after hydration On chronic anticoagulation with Coumadin.    Lumbar fractures, age indeterminate: Probably secondary to the fall few weeks ago.  Pain control and physical therapy evaluation.   Mild diastolic heart failure acute on chronic Chest x-ray showed mild CHF she is got bilateral pedal edema and is requiring  oxygen at this time. Resume Lasix as needed and if her bp tolerated.    Elevated troponins Probably from demand ischemia from sepsis.  Overnight physician spoke with Dr. Percival Spanish and recommended no  ischemic workup at this time.  Patient denies any chest pain shortness of breath or presyncope.   Type 2 diabetes mellitus Last A1c is 5.5, holding metformin.  Check CBGs with sensitive scale insulin. CBG (last 3)  Recent Labs    12/07/17 1220 12/07/17 2144 12/08/17 0811  GLUCAP 118* 124* 138*      History of chronic venous stasis ulcerations Wound care consulted and recommendations given   Possible hepatocellular carcinoma No workup as per the family as per the daughter at bedside.    View of her multiple medical problems and admission for sepsis if patient does not improve clinically would warrant a palliative care consult and possible transition to comfort care. She continues to improve we will plan for discharge to sniff when she is medically ready.  Abnormal TSH Free T4 is normal.      DVT prophylaxis: COUMADIN.  Code Status: DNR Family Communication: none at bedside, discussed with daughter over the phone Disposition Plan: Probably to SNF when stable.   Consultants:  SLP Wound care Phone consult with cardiology.   Procedures: none.   Antimicrobials: vancomycin and cefepiem since admission later narrowed to rocephin for streptococcus.    Subjective: Denies any chest pain shortness of breath, nausea, vomiting or abdominal pain. Vitals:   12/08/17 0200 12/08/17 0307 12/08/17 0342 12/08/17 0700  BP: 113/75 114/69    Pulse: (!) 46 63    Resp:  16 19    Temp:   98 F (36.7 C) 98.5 F (36.9 C)  TempSrc:   Oral Oral  SpO2: 100% 99%    Weight:      Height:        Intake/Output Summary (Last 24 hours) at 12/08/2017 1105 Last data filed at 12/08/2017 0730 Gross per 24 hour  Intake 530 ml  Output 1050 ml  Net -520 ml   Filed Weights   12/05/17 1823  12/06/17 0420 12/07/17 0500  Weight: 76.2 kg (168 lb) 78.8 kg (173 lb 11.6 oz) 83 kg (182 lb 15.7 oz)    Examination:   General exam: Calm and comfortable, not in distress Respiratory system: Diminished air entry at bases, no wheezing or rhonchi Cardiovascular system: S1 & S2 heard, irregular, no JVD. Gastrointestinal system: Soft nontender nondistended, bowel sounds are good Central nervous system: Alert and oriented. No focal neurological deficits. Extremities: left lower extremity swelling with left pretibial lateral wound is 2cm x 0.8cm x 0.1cm.  The wound is bandage Skin: Left lower extremity wound see above Psychiatry:  Mood & affect appropriate.     Data Reviewed: I have personally reviewed following labs and imaging studies  CBC: Recent Labs  Lab 12/05/17 1845 12/06/17 0135 12/07/17 0841  WBC 21.7* 31.3* 21.3*  NEUTROABS 20.4* 29.1* 19.3*  HGB 12.1 10.5* 10.3*  HCT 37.2 33.0* 33.0*  MCV 82.5 82.7 83.8  PLT 349 265 540   Basic Metabolic Panel: Recent Labs  Lab 12/02/17 12/05/17 1845 12/06/17 0135 12/07/17 0841  NA 134 133* 134* 132*  K 3.5 4.1 4.3 3.8  CL  --  93* 99* 102  CO2  --  25 23 20*  GLUCOSE  --  140* 107* 115*  BUN 66* 59* 56* 58*  CREATININE 1.61 1.45* 1.63* 1.70*  CALCIUM 8.6 9.0 7.9* 8.3*   GFR: Estimated Creatinine Clearance: 29.9 mL/min (A) (by C-G formula based on SCr of 1.7 mg/dL (H)). Liver Function Tests: Recent Labs  Lab 12/06/17 0135  AST 55*  ALT 19  ALKPHOS 100  BILITOT 1.7*  PROT 6.0*  ALBUMIN 2.7*   No results for input(s): LIPASE, AMYLASE in the last 168 hours. No results for input(s): AMMONIA in the last 168 hours. Coagulation Profile: Recent Labs  Lab 12/05/17 1853 12/06/17 0620 12/07/17 0321 12/08/17 0255  INR 2.22 2.47 3.22 3.50   Cardiac Enzymes: Recent Labs  Lab 12/06/17 0135  TROPONINI 1.89*   BNP (last 3 results) No results for input(s): PROBNP in the last 8760 hours. HbA1C: No results for  input(s): HGBA1C in the last 72 hours. CBG: Recent Labs  Lab 12/06/17 2203 12/07/17 0743 12/07/17 1220 12/07/17 2144 12/08/17 0811  GLUCAP 151* 111* 118* 124* 138*   Lipid Profile: No results for input(s): CHOL, HDL, LDLCALC, TRIG, CHOLHDL, LDLDIRECT in the last 72 hours. Thyroid Function Tests: Recent Labs    12/06/17 0135  TSH 14.674*  FREET4 0.87   Anemia Panel: No results for input(s): VITAMINB12, FOLATE, FERRITIN, TIBC, IRON, RETICCTPCT in the last 72 hours. Sepsis Labs: Recent Labs  Lab 12/05/17 2023 12/05/17 2241 12/07/17 0841  LATICACIDVEN 2.02* 1.59 1.1    Recent Results (from the past 240 hour(s))  Blood culture (routine x 2)     Status: Abnormal   Collection Time: 12/05/17  8:56 PM  Result Value Ref Range Status   Specimen Description BLOOD LEFT ANTECUBITAL  Final   Special Requests   Final    BOTTLES DRAWN AEROBIC AND  ANAEROBIC Blood Culture adequate volume   Culture  Setup Time   Final    GRAM POSITIVE COCCI IN CHAINS ANAEROBIC BOTTLE ONLY CRITICAL VALUE NOTED.  VALUE IS CONSISTENT WITH PREVIOUSLY REPORTED AND CALLED VALUE.    Culture (A)  Final    STREPTOCOCCUS DYSGALACTIAE SUSCEPTIBILITIES PERFORMED ON PREVIOUS CULTURE WITHIN THE LAST 5 DAYS. Performed at [facility], [place] [address] [place], [place] [address]    Report Status 12/08/2017 FINAL  Final  Blood culture (routine x 2)     Status: Abnormal   Collection Time: 12/05/17  9:01 PM  Result Value Ref Range Status   Specimen Description BLOOD BLOOD RIGHT FOREARM  Final   Special Requests   Final    BOTTLES DRAWN AEROBIC AND ANAEROBIC Blood Culture adequate volume   Culture  Setup Time   Final    GRAM POSITIVE COCCI IN CHAINS IN BOTH AEROBIC AND ANAEROBIC BOTTLES CRITICAL RESULT CALLED TO, READ BACK BY AND VERIFIED WITH: EBurman Foster.D. 14:30 12/06/17 ([email or web address]) Performed at [facility], [place] [address], [place], [place] [address]    Culture STREPTOCOCCUS DYSGALACTIAE (A)   Final   Report Status 12/08/2017 FINAL  Final   Organism ID, Bacteria STREPTOCOCCUS DYSGALACTIAE  Final      Susceptibility   Streptococcus dysgalactiae - MIC*    CLINDAMYCIN >=1 RESISTANT Resistant     AMPICILLIN <=0.25 SENSITIVE Sensitive     ERYTHROMYCIN >=8 RESISTANT Resistant     VANCOMYCIN 0.25 SENSITIVE Sensitive     CEFTRIAXONE <=0.12 SENSITIVE Sensitive     LEVOFLOXACIN 0.5 SENSITIVE Sensitive     * STREPTOCOCCUS DYSGALACTIAE  Blood Culture ID Panel (Reflexed)     Status: Abnormal   Collection Time: 12/05/17  9:01 PM  Result Value Ref Range Status   Enterococcus species NOT DETECTED NOT DETECTED Final   Listeria monocytogenes NOT DETECTED NOT DETECTED Final   Staphylococcus species NOT DETECTED NOT DETECTED Final   Staphylococcus aureus NOT DETECTED NOT DETECTED Final   Streptococcus species DETECTED (A) NOT DETECTED Final    Comment: Not Enterococcus species, Streptococcus agalactiae, Streptococcus pyogenes, or Streptococcus pneumoniae. CRITICAL RESULT CALLED TO, READ BACK BY AND VERIFIED WITH: EBurman Foster.D. 14:30 12/06/17 ([email or web address])    Streptococcus agalactiae NOT DETECTED NOT DETECTED Final   Streptococcus pneumoniae NOT DETECTED NOT DETECTED Final   Streptococcus pyogenes NOT DETECTED NOT DETECTED Final   Acinetobacter baumannii NOT DETECTED NOT DETECTED Final   Enterobacteriaceae species NOT DETECTED NOT DETECTED Final   Enterobacter cloacae complex NOT DETECTED NOT DETECTED Final   Escherichia coli NOT DETECTED NOT DETECTED Final   Klebsiella oxytoca NOT DETECTED NOT DETECTED Final   Klebsiella pneumoniae NOT DETECTED NOT DETECTED Final   Proteus species NOT DETECTED NOT DETECTED Final   Serratia marcescens NOT DETECTED NOT DETECTED Final   Haemophilus influenzae NOT DETECTED NOT DETECTED Final   Neisseria meningitidis NOT DETECTED NOT DETECTED Final   Pseudomonas aeruginosa NOT DETECTED NOT DETECTED Final   Candida albicans NOT DETECTED NOT DETECTED Final    Candida glabrata NOT DETECTED NOT DETECTED Final   Candida krusei NOT DETECTED NOT DETECTED Final   Candida parapsilosis NOT DETECTED NOT DETECTED Final   Candida tropicalis NOT DETECTED NOT DETECTED Final    Comment: Performed at [facility], [place] [address], [place], [place] [address]  MRSA PCR Screening     Status: Abnormal   Collection Time: 12/06/17  1:20 AM  Result Value Ref Range Status  MRSA by PCR POSITIVE (A) NEGATIVE Final    Comment:        The GeneXpert MRSA Assay (FDA approved for NASAL specimens only), is one component of a comprehensive MRSA colonization surveillance program. It is not intended to diagnose MRSA infection nor to guide or monitor treatment for MRSA infections. RESULT CALLED TO, READ BACK BY AND VERIFIED WITH: SCOTT,B RN 12.7.18 @0515  ZANDO,C          Radiology Studies: No results found.      Scheduled Meds: . Chlorhexidine Gluconate Cloth  6 each Topical Q0600  . collagenase   Topical Daily  . furosemide  20 mg Oral Daily  . insulin aspart  0-9 Units Subcutaneous TID WC  . mupirocin ointment  1 application Nasal BID  . pantoprazole (PROTONIX) IV  40 mg Intravenous Q12H  . Warfarin - Pharmacist Dosing Inpatient   Does not apply q1800   Continuous Infusions: . cefTRIAXone (ROCEPHIN)  IV Stopped (12/07/17 2027)     LOS: 3 days    Time spent: 35 minutes.     Hosie Poisson, MD [facility] Pager [phone]  If 7PM-7AM, please contact night-coverage [email or web address] Password [email or web address] 12/08/2017, 11:05 AM

## 2017-12-08 NOTE — Progress Notes (Signed)
ANTICOAGULATION CONSULT NOTE - Follow Up Consult  Pharmacy Consult for Warfarin Indication: atrial fibrillation  Allergies  Allergen Reactions  . Ace Inhibitors Other (See Comments)    Intolerance per Dr. Doug Sou note  . Amlodipine Other (See Comments)    Intolerance per Dr. Doug Sou note   . Statins Other (See Comments)    Muscle soreness  . Sulfa Drugs Cross Reactors Itching  . Zetia [Ezetimibe] Other (See Comments)    Muscle soreness  . Fenofibrate Other (See Comments)    Muscle soreness    Patient Measurements: Height: 5\' 10"  (177.8 cm) Weight: 182 lb 15.7 oz (83 kg) IBW/kg (Calculated) : 68.5  Vital Signs: Temp: 98 F (36.7 C) (12/09 0342) Temp Source: Oral (12/09 0342) BP: 114/69 (12/09 0307) Pulse Rate: 63 (12/09 0307)  Labs: Recent Labs    12/05/17 1845  12/06/17 0135 12/06/17 0620 12/07/17 0321 12/07/17 0841 12/08/17 0255  HGB 12.1  --  10.5*  --   --  10.3*  --   HCT 37.2  --  33.0*  --   --  33.0*  --   PLT 349  --  265  --   --  236  --   LABPROT  --    < >  --  26.6* 32.6*  --  34.8*  INR  --    < >  --  2.47 3.22  --  3.50  CREATININE 1.45*  --  1.63*  --   --  1.70*  --   TROPONINI  --   --  1.89*  --   --   --   --    < > = values in this interval not displayed.    Estimated Creatinine Clearance: 29.9 mL/min (A) (by C-G formula based on SCr of 1.7 mg/dL (H)).   Medications:  Scheduled:  . Chlorhexidine Gluconate Cloth  6 each Topical Q0600  . collagenase   Topical Daily  . furosemide  20 mg Intravenous Daily  . insulin aspart  0-9 Units Subcutaneous TID WC  . mupirocin ointment  1 application Nasal BID  . pantoprazole (PROTONIX) IV  40 mg Intravenous Q12H  . Warfarin - Pharmacist Dosing Inpatient   Does not apply q1800   Infusions:  . cefTRIAXone (ROCEPHIN)  IV Stopped (12/07/17 2027)    Assessment: 40 yoF presented from [facility] on 12/6 with chills and back pain.  PMH includes chronic anticoagulation on warfarin for afib.     PTA warfarin dose warfarin per [facility] note is 2.5mg  po daily, except 5mg  on Monday, Wednesday, Friday.  But per Med history is listed as 3.5 mg (2.5mg  tablet + 1 mg tablet) on Sunday, Tuesday, Thursday, Saturday. Admission INR is therapeutic at 2.22  Clarified warfarin dosing: 3.5mg  daily except 5mg  Mon/Wed/Fri  Today, 12/08/2017: INR 3.5, SUPRAtherapeutic and rising CBC: Hgb decreased to 10.3, Plt remain WNL No bleeding or complications reported. Diet: Regular but no intake recorted No major drug-drug interactions identified. Atibiotics (ceftriaxone) may increase INR.   Goal of Therapy:  INR 2-3 Monitor platelets by anticoagulation protocol: Yes   Plan:   Hold warfarin again today.  Daily PT/INR.  Monitor for signs and symptoms of bleeding.  Peggyann Juba, PharmD, BCPS Pager: [phone] 12/08/2017 8:15 AM

## 2017-12-09 LAB — GLUCOSE, CAPILLARY
GLUCOSE-CAPILLARY: 99 mg/dL (ref 65–99)
GLUCOSE-CAPILLARY: 73 mg/dL (ref 65–99)
GLUCOSE-CAPILLARY: 116 mg/dL — AB (ref 65–99)
Glucose-Capillary: 127 mg/dL — ABNORMAL HIGH (ref 65–99)

## 2017-12-09 LAB — CBC
HCT: 34 % — ABNORMAL LOW (ref 36.0–46.0)
Hemoglobin: 10.8 g/dL — ABNORMAL LOW (ref 12.0–15.0)
MCH: 26.1 pg (ref 26.0–34.0)
MCHC: 31.8 g/dL (ref 30.0–36.0)
MCV: 82.1 fL (ref 78.0–100.0)
PLATELETS: 267 10*3/uL (ref 150–400)
RBC: 4.14 MIL/uL (ref 3.87–5.11)
RDW: 18.4 % — AB (ref 11.5–15.5)
WBC: 11.5 10*3/uL — AB (ref 4.0–10.5)

## 2017-12-09 LAB — BASIC METABOLIC PANEL
Anion gap: 10 (ref 5–15)
BUN: 44 mg/dL — AB (ref 6–20)
CALCIUM: 8.7 mg/dL — AB (ref 8.9–10.3)
CO2: 23 mmol/L (ref 22–32)
CREATININE: 1.15 mg/dL — AB (ref 0.44–1.00)
Chloride: 105 mmol/L (ref 101–111)
GFR calc Af Amer: 50 mL/min — ABNORMAL LOW (ref >60)
GFR, EST NON AFRICAN AMERICAN: 43 mL/min — AB (ref >60)
GLUCOSE: 96 mg/dL (ref 65–99)
Potassium: 3 mmol/L — ABNORMAL LOW (ref 3.5–5.1)
SODIUM: 138 mmol/L (ref 135–145)

## 2017-12-09 LAB — PROTIME-INR
INR: 3.47
Prothrombin Time: 34.6 seconds — ABNORMAL HIGH (ref 11.4–15.2)

## 2017-12-09 MED ORDER — GUAIFENESIN-DM 100-10 MG/5ML PO SYRP: 10.0000 mL | ORAL_SOLUTION | ORAL | Status: DC | PRN

## 2017-12-09 MED ORDER — POTASSIUM CHLORIDE CRYS ER 20 MEQ PO TBCR
40.0000 meq | EXTENDED_RELEASE_TABLET | Freq: Two times a day (BID) | ORAL | Status: AC
  Administered 2017-12-09 (×2): 40 meq via ORAL
  Filled 2017-12-09 (×2): qty 2

## 2017-12-09 MED ORDER — TRAMADOL HCL 50 MG PO TABS
50.0000 mg | ORAL_TABLET | Freq: Once | ORAL | Status: AC
  Administered 2017-12-09: 50 mg via ORAL
  Filled 2017-12-09: qty 1

## 2017-12-09 MED ORDER — GUAIFENESIN-DM 100-10 MG/5ML PO SYRP
5.0000 mL | ORAL_SOLUTION | ORAL | Status: DC | PRN
Start: 2017-12-09 — End: 2017-12-09
  Administered 2017-12-09: 5 mL via ORAL
  Filled 2017-12-09: qty 10

## 2017-12-09 NOTE — Progress Notes (Signed)
ANTICOAGULATION CONSULT NOTE - Follow Up Consult  Pharmacy Consult for Warfarin Indication: atrial fibrillation  Allergies  Allergen Reactions  . Ace Inhibitors Other (See Comments)    Intolerance per Dr. Doug Sou note  . Amlodipine Other (See Comments)    Intolerance per Dr. Doug Sou note   . Statins Other (See Comments)    Muscle soreness  . Sulfa Drugs Cross Reactors Itching  . Zetia [Ezetimibe] Other (See Comments)    Muscle soreness  . Fenofibrate Other (See Comments)    Muscle soreness    Patient Measurements: Height: 5\' 10"  (177.8 cm) Weight: 182 lb 5.1 oz (82.7 kg) IBW/kg (Calculated) : 68.5  Vital Signs: Temp: 97.5 F (36.4 C) (12/10 0400) Temp Source: Axillary (12/10 0400) BP: 112/64 (12/10 0800) Pulse Rate: 78 (12/10 0800)  Labs: Recent Labs    12/07/17 0321 12/07/17 0841 12/08/17 0255 12/09/17 0328  HGB  --  10.3*  --  10.8*  HCT  --  33.0*  --  34.0*  PLT  --  236  --  267  LABPROT 32.6*  --  34.8* 34.6*  INR 3.22  --  3.50 3.47  CREATININE  --  1.70*  --   --     Estimated Creatinine Clearance: 29.9 mL/min (A) (by C-G formula based on SCr of 1.7 mg/dL (H)).   Medications:  Scheduled:  . Chlorhexidine Gluconate Cloth  6 each Topical Q0600  . collagenase   Topical Daily  . furosemide  20 mg Oral Daily  . insulin aspart  0-9 Units Subcutaneous TID WC  . mupirocin ointment  1 application Nasal BID  . pantoprazole (PROTONIX) IV  40 mg Intravenous Q12H  . Warfarin - Pharmacist Dosing Inpatient   Does not apply q1800   Infusions:  . cefTRIAXone (ROCEPHIN)  IV Stopped (12/08/17 2102)    Assessment: 32 yoF presented from [place] on 12/6 with chills and back pain.  PMH includes chronic anticoagulation on warfarin for afib.    PTA warfarin dose warfarin per NH note is 2.5mg  po daily, except 5mg  on Monday, Wednesday, Friday.  But per Med history is listed as 3.5 mg (2.5mg  tablet + 1 mg tablet) on Sunday, Tuesday, Thursday,  Saturday. Admission INR is therapeutic at 2.22  Clarified warfarin dosing: 3.5mg  daily except 5mg  Mon/Wed/Fri  Today, 12/09/2017: INR 3.47 - SUPRAtherapeutic (unchanged from yday) CBC: Hgb sl improved overnight, Plt remain WNL No bleeding or complications reported. Diet: Regular but no intake recorted No major drug-drug interactions identified. Atibiotics (ceftriaxone) may increase INR.  Goal of Therapy:  INR 2-3 Monitor platelets by anticoagulation protocol: Yes   Plan:   Hold warfarin again today.  Daily PT/INR.  Monitor for signs and symptoms of bleeding.  Doreene Eland, PharmD, BCPS.   Pager: [phone] 12/09/2017 9:31 AM

## 2017-12-09 NOTE — Progress Notes (Signed)
PROGRESS NOTE    [name]  EXH:[id number] DOB: [birth year] DOA: 12/05/2017 PCP: [name], MD    Brief Narrative:  [name] is a 82 y.o. female with medical history significant of CAD, diastolic CHF, pulmonary HTN, NSTEMI, HTN and atrial fibrillationon coumadin; reports having acute onset of chills and lower back pain, . 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 Admitted to stepdown for hypotension, closer monitoring. Patient's blood pressure improved , keep her map her map is greater than 65.  She was started on broad-spectrum antibiotics with vancomycin and cefepime.  As her blood cultures showed Streptococcus we have narrowed her antibiotic to Rocephin.  Wound care consulted for bilateral lower extremity ulcers and recommendations given. She continues to do better.  She remains afebrile leukocytosis is improving.  Her lactic acid is normal at 1.1 echocardiogram ordered to evaluate for endocarditis.  Possible enteritis with ileus seen on CT abdomen pelvis   She denies any nausea vomiting or abdominal pain at this time. Advance diet as tolerated.  Currently she is on regular diet and is able to tolerate.   Hypotension  Resolved   Chronic atrial fibrillation with RVR Rates better after hydration On chronic anticoagulation with Coumadin.   Lumbar fractures, age indeterminate: Probably secondary to the fall few weeks ago.  Pain control and physical therapy evaluation.   Mild diastolic heart failure acute on chronic Chest x-ray showed mild CHF she is got bilateral pedal edema and is  requiring oxygen at this time. Resume Lasix as needed and if her bp tolerated.  Resume lasix 20 mg daily.    Elevated troponins Probably from demand ischemia from sepsis.  Overnight physician spoke with Dr. Percival Spanish cardiology and recommended no  ischemic workup at this time.  Patient denies any chest pain shortness of breath or presyncope.   Type 2 diabetes mellitus Last A1c is 5.5, holding metformin.  Check CBGs with sensitive scale insulin. CBG (last 3)  Recent Labs    12/08/17 2141 12/09/17 0840 12/09/17 1229  GLUCAP 82 99 73      History of chronic venous stasis ulcerations  On the left leg and right thigh. Mild cellulitis.  Wound care consulted and recommendations given   Possible hepatocellular carcinoma No workup as per the family as per the daughter at bedside.  Abnormal TSH Free T4 is normal.      DVT prophylaxis: COUMADIN.  Code Status: DNR Family Communication: none at bedside, discussed with daughter over the phone on 12/10.  Disposition Plan: Probably to SNF when stable.   Consultants:  SLP Wound care Phone consult with cardiology.   Procedures: none.   Antimicrobials: vancomycin and cefepiem since admission later narrowed to rocephin for streptococcus.    Subjective: No chest pain or sob.  No nausea, vomiting.  Reports having a cough.   Vitals:   12/09/17 0800 12/09/17 0900 12/09/17 0940 12/09/17 1200  BP: 112/64 (!) 88/47 (!) 119/59   Pulse: 78 79 86   Resp: 17 18 16    Temp: (!) 97.5 F (36.4 C)   97.6 F (36.4 C)  TempSrc: Oral  Oral  SpO2: (!) 84% 94% 94%   Weight:      Height:        Intake/Output Summary (Last 24 hours) at 12/09/2017 1407 Last data filed at 12/09/2017 0000 Gross per 24 hour  Intake 470 ml  Output 1150 ml  Net -680 ml   Filed Weights   12/06/17 0420 12/07/17 0500 12/08/17 2000  Weight: 78.8 kg (173 lb 11.6 oz) 83 kg (182 lb 15.7 oz) 82.7 kg (182 lb 5.1 oz)    Examination:   General exam: Calm and  comfortable, not in distress, off oxygen.  Respiratory system: Diminished air entry at bases, coarse breath sounds.  Cardiovascular system: S1 & S2 heard, irregular, no JVD. Mild leg edema.  Gastrointestinal system: Soft nontender nondistended, bowel sounds are good Central nervous system: Alert and oriented. Non focal.  Extremities: left lower extremity swelling with left pretibial lateral wound is 2cm x 0.8cm x 0.1cm.  The wound is bandaged and no drainage. Right thigh wound bandaged, tenderness improved. Erythema improved.  Skin: Left and right lower extremity  wound see above Psychiatry:  Mood & affect appropriate.     Data Reviewed: I have personally reviewed following labs and imaging studies  CBC: Recent Labs  Lab 12/05/17 1845 12/06/17 0135 12/07/17 0841 12/09/17 0328  WBC 21.7* 31.3* 21.3* 11.5*  NEUTROABS 20.4* 29.1* 19.3*  --   HGB 12.1 10.5* 10.3* 10.8*  HCT 37.2 33.0* 33.0* 34.0*  MCV 82.5 82.7 83.8 82.1  PLT 349 265 236 102   Basic Metabolic Panel: Recent Labs  Lab 12/05/17 1845 12/06/17 0135 12/07/17 0841 12/09/17 1201  NA 133* 134* 132* 138  K 4.1 4.3 3.8 3.0*  CL 93* 99* 102 105  CO2 25 23 20* 23  GLUCOSE 140* 107* 115* 96  BUN 59* 56* 58* 44*  CREATININE 1.45* 1.63* 1.70* 1.15*  CALCIUM 9.0 7.9* 8.3* 8.7*   GFR: Estimated Creatinine Clearance: 44.2 mL/min (A) (by C-G formula based on SCr of 1.15 mg/dL (H)). Liver Function Tests: Recent Labs  Lab 12/06/17 0135  AST 55*  ALT 19  ALKPHOS 100  BILITOT 1.7*  PROT 6.0*  ALBUMIN 2.7*   No results for input(s): LIPASE, AMYLASE in the last 168 hours. No results for input(s): AMMONIA in the last 168 hours. Coagulation Profile: Recent Labs  Lab 12/05/17 1853 12/06/17 0620 12/07/17 0321 12/08/17 0255 12/09/17 0328  INR 2.22 2.47 3.22 3.50 3.47   Cardiac Enzymes: Recent Labs  Lab 12/06/17 0135  TROPONINI 1.89*   BNP (last 3 results) No results for input(s): PROBNP in the last 8760  hours. HbA1C: No results for input(s): HGBA1C in the last 72 hours. CBG: Recent Labs  Lab 12/08/17 1130 12/08/17 1619 12/08/17 2141 12/09/17 0840 12/09/17 1229  GLUCAP 122* 139* 82 99 73   Lipid Profile: No results for input(s): CHOL, HDL, LDLCALC, TRIG, CHOLHDL, LDLDIRECT in the last 72 hours. Thyroid Function Tests: No results for input(s): TSH, T4TOTAL, FREET4, T3FREE, THYROIDAB in the last 72 hours. Anemia Panel: No results for input(s): VITAMINB12, FOLATE, FERRITIN, TIBC, IRON, RETICCTPCT in the last 72 hours. Sepsis Labs: Recent Labs  Lab 12/05/17 2023 12/05/17 2241 12/07/17 0841  LATICACIDVEN 2.02* 1.59 1.1    Recent Results (from the past 240 hour(s))  Blood culture (routine x 2)     Status: Abnormal   Collection Time: 12/05/17  8:56 PM  Result Value Ref Range Status   Specimen Description BLOOD LEFT ANTECUBITAL  Final   Special Requests  Final    BOTTLES DRAWN AEROBIC AND ANAEROBIC Blood Culture adequate volume   Culture  Setup Time   Final    GRAM POSITIVE COCCI IN CHAINS ANAEROBIC BOTTLE ONLY CRITICAL VALUE NOTED.  VALUE IS CONSISTENT WITH PREVIOUSLY REPORTED AND CALLED VALUE.    Culture (A)  Final    STREPTOCOCCUS DYSGALACTIAE SUSCEPTIBILITIES PERFORMED ON PREVIOUS CULTURE WITHIN THE LAST 5 DAYS. Performed at [facility], [place] [address], [place], [place] [address]    Report Status 12/08/2017 FINAL  Final  Blood culture (routine x 2)     Status: Abnormal   Collection Time: 12/05/17  9:01 PM  Result Value Ref Range Status   Specimen Description BLOOD BLOOD RIGHT FOREARM  Final   Special Requests   Final    BOTTLES DRAWN AEROBIC AND ANAEROBIC Blood Culture adequate volume   Culture  Setup Time   Final    GRAM POSITIVE COCCI IN CHAINS IN BOTH AEROBIC AND ANAEROBIC BOTTLES CRITICAL RESULT CALLED TO, READ BACK BY AND VERIFIED WITH: EBurman Foster.D. 14:30 12/06/17 ([email or web address]) Performed at [facility], [place] [address], [place], [place]  [address]    Culture STREPTOCOCCUS DYSGALACTIAE (A)  Final   Report Status 12/08/2017 FINAL  Final   Organism ID, Bacteria STREPTOCOCCUS DYSGALACTIAE  Final      Susceptibility   Streptococcus dysgalactiae - MIC*    CLINDAMYCIN >=1 RESISTANT Resistant     AMPICILLIN <=0.25 SENSITIVE Sensitive     ERYTHROMYCIN >=8 RESISTANT Resistant     VANCOMYCIN 0.25 SENSITIVE Sensitive     CEFTRIAXONE <=0.12 SENSITIVE Sensitive     LEVOFLOXACIN 0.5 SENSITIVE Sensitive     * STREPTOCOCCUS DYSGALACTIAE  Blood Culture ID Panel (Reflexed)     Status: Abnormal   Collection Time: 12/05/17  9:01 PM  Result Value Ref Range Status   Enterococcus species NOT DETECTED NOT DETECTED Final   Listeria monocytogenes NOT DETECTED NOT DETECTED Final   Staphylococcus species NOT DETECTED NOT DETECTED Final   Staphylococcus aureus NOT DETECTED NOT DETECTED Final   Streptococcus species DETECTED (A) NOT DETECTED Final    Comment: Not Enterococcus species, Streptococcus agalactiae, Streptococcus pyogenes, or Streptococcus pneumoniae. CRITICAL RESULT CALLED TO, READ BACK BY AND VERIFIED WITH: EBurman Foster.D. 14:30 12/06/17 ([email or web address])    Streptococcus agalactiae NOT DETECTED NOT DETECTED Final   Streptococcus pneumoniae NOT DETECTED NOT DETECTED Final   Streptococcus pyogenes NOT DETECTED NOT DETECTED Final   Acinetobacter baumannii NOT DETECTED NOT DETECTED Final   Enterobacteriaceae species NOT DETECTED NOT DETECTED Final   Enterobacter cloacae complex NOT DETECTED NOT DETECTED Final   Escherichia coli NOT DETECTED NOT DETECTED Final   Klebsiella oxytoca NOT DETECTED NOT DETECTED Final   Klebsiella pneumoniae NOT DETECTED NOT DETECTED Final   Proteus species NOT DETECTED NOT DETECTED Final   Serratia marcescens NOT DETECTED NOT DETECTED Final   Haemophilus influenzae NOT DETECTED NOT DETECTED Final   Neisseria meningitidis NOT DETECTED NOT DETECTED Final   Pseudomonas aeruginosa NOT DETECTED NOT DETECTED Final    Candida albicans NOT DETECTED NOT DETECTED Final   Candida glabrata NOT DETECTED NOT DETECTED Final   Candida krusei NOT DETECTED NOT DETECTED Final   Candida parapsilosis NOT DETECTED NOT DETECTED Final   Candida tropicalis NOT DETECTED NOT DETECTED Final    Comment: Performed at [facility], [place] [address] [place],  [address]  MRSA PCR Screening     Status: Abnormal   Collection Time: 12/06/17  1:20 AM  Result Value Ref Range Status   MRSA by PCR POSITIVE (A) NEGATIVE Final    Comment:        The GeneXpert MRSA Assay (FDA approved for NASAL specimens only), is one component of a comprehensive MRSA colonization surveillance program. It is not intended to diagnose MRSA infection nor to guide or monitor treatment for MRSA infections. RESULT CALLED TO, READ BACK BY AND VERIFIED WITH: SCOTT,B RN 12.7.18 @0515  ZANDO,C          Radiology Studies: No results found.      Scheduled Meds: . Chlorhexidine Gluconate Cloth  6 each Topical Q0600  . collagenase   Topical Daily  . furosemide  20 mg Oral Daily  . insulin aspart  0-9 Units Subcutaneous TID WC  . mupirocin ointment  1 application Nasal BID  . pantoprazole (PROTONIX) IV  40 mg Intravenous Q12H  . Warfarin - Pharmacist Dosing Inpatient   Does not apply q1800   Continuous Infusions: . cefTRIAXone (ROCEPHIN)  IV Stopped (12/08/17 2102)     LOS: 4 days    Time spent: 35 minutes.     Hosie Poisson, MD [facility] Pager [phone]  If 7PM-7AM, please contact night-coverage [email or web address] Password [email or web address] 12/09/2017, 2:07 PM

## 2017-12-09 NOTE — Care Management Note (Signed)
Case Management Note  Patient Details  Name: [name] MRN: [id number] Date of Birth: May 01, 1933  Subjective/Objective:                  Sepsis with positive bld cultures  Action/Plan: Will follow for cm needs  Expected Discharge Date:                  Expected Discharge Plan:  Home/Self Care  In-House Referral:     Discharge planning Services  CM Consult  Post Acute Care Choice:    Choice offered to:     DME Arranged:    DME Agency:     HH Arranged:    HH Agency:     Status of Service:  In process, will continue to follow  If discussed at Long Length of Stay Meetings, dates discussed:    Additional Comments:  Leeroy Cha, RN 12/09/2017, 8:40 AM

## 2017-12-10 ENCOUNTER — Inpatient Hospital Stay (HOSPITAL_COMMUNITY): Payer: Medicare Other

## 2017-12-10 DIAGNOSIS — I361 Nonrheumatic tricuspid (valve) insufficiency: Secondary | ICD-10-CM

## 2017-12-10 LAB — GLUCOSE, CAPILLARY
GLUCOSE-CAPILLARY: 98 mg/dL (ref 65–99)
GLUCOSE-CAPILLARY: 135 mg/dL — AB (ref 65–99)
GLUCOSE-CAPILLARY: 135 mg/dL — AB (ref 65–99)
Glucose-Capillary: 130 mg/dL — ABNORMAL HIGH (ref 65–99)

## 2017-12-10 LAB — PROTIME-INR
INR: 3.71
PROTHROMBIN TIME: 36.5 s — AB (ref 11.4–15.2)

## 2017-12-10 LAB — ECHOCARDIOGRAM COMPLETE
HEIGHTINCHES: 70 in
Weight: 2962.98 oz

## 2017-12-10 LAB — POTASSIUM: POTASSIUM: 3.7 mmol/L (ref 3.5–5.1)

## 2017-12-10 MED ORDER — FUROSEMIDE 10 MG/ML IJ SOLN
20.0000 mg | Freq: Every day | INTRAMUSCULAR | Status: DC
  Administered 2017-12-10: 20 mg via INTRAVENOUS
  Filled 2017-12-10: qty 2

## 2017-12-10 NOTE — Progress Notes (Signed)
  Echocardiogram 2D Echocardiogram has been performed.  [name] M 12/10/2017, 9:42 AM

## 2017-12-10 NOTE — Progress Notes (Signed)
Foley catheter discontinued. Patient transferred with belongings (glasses) to 1420. Hand off report given to [place] over the phone. Will inform daughter about transfer.

## 2017-12-10 NOTE — Evaluation (Signed)
Physical Therapy Evaluation Patient Details Name: [name] MRN: [id number] DOB: 1933/07/02 Today's Date: 12/10/2017   History of Present Illness  83 y.o. female with medical history significant of scoliosis, spinal stenosis, CAD, diastolic CHF, pulmonary HTN, NSTEMI, HTN and atrial fibrillation on coumadin. She was admitted for sepsis from cellulitis of the left lower extremit (also hx of chronic venous stasis ulcerations)   Clinical Impression  Pt admitted with above diagnosis. Pt currently with functional limitations due to the deficits listed below (see PT Problem List).  Pt will benefit from skilled PT to increase their independence and safety with mobility to allow discharge to the venue listed below.  Pt assisted to standing and requested using BSC urgently.  Pt then assisted to recliner.  Pt reports living alone however has home care 5x/week.  Pt may need SNF upon d/c pending availability of assist /supervision upon d/c home (per chart from recent admission: [facility])     Follow Up Recommendations SNF;Supervision for mobility/OOB    Equipment Recommendations  None recommended by PT    Recommendations for Other Services       Precautions / Restrictions Precautions Precautions: Fall Precaution Comments: incontinent BM (pt believes from antibiotics)      Mobility  Bed Mobility Overal bed mobility: Needs Assistance Bed Mobility: Supine to Sit     Supine to sit: Min assist     General bed mobility comments: assist for trunk upright  Transfers Overall transfer level: Needs assistance Equipment used: Rolling walker (2 wheeled) Transfers: Sit to/from [facility] Sit to Stand: Min assist Stand pivot transfers: Min guard       General transfer comment: verbal cues for hand placement and safe technique, more assist to rise from bed, performed better with rise from [facility] using armrests, pt will loose BM so only assisted to [facility] and then  recliner  Ambulation/Gait                Stairs            Wheelchair Mobility    Modified Rankin (Stroke Patients Only)       Balance Overall balance assessment: Needs assistance         Standing balance support: Bilateral upper extremity supported Standing balance-Leahy Scale: Poor Standing balance comment: requires UE support                             Pertinent Vitals/Pain Pain Assessment: No/denies pain    Home Living   Living Arrangements: Alone Available Help at Discharge: Home health;Personal care attendant Type of Home: Independent living facility Home Access: Level entry     Home Layout: One level Home Equipment: Walker - 2 wheels;Transport chair;Grab bars - toilet;Grab bars - tub/shower;Shower seat - built in      Prior Function Level of Independence: Needs assistance;Independent with assistive device(s)   Gait / Transfers Assistance Needed: pt reports using RW for mobility   ADL's / Homemaking Assistance Needed: has "aide" 5 days/week, 3 hours / day for IADLs, pt reports she performs her own ADLs        Hand Dominance        Extremity/Trunk Assessment        Lower Extremity Assessment Lower Extremity Assessment: Generalized weakness       Communication   Communication: No difficulties  Cognition Arousal/Alertness: Awake/alert Behavior During Therapy: WFL for tasks assessed/performed Overall Cognitive Status: Within Functional Limits for tasks assessed  General Comments      Exercises     Assessment/Plan    PT Assessment Patient needs continued PT services  PT Problem List Decreased strength;Decreased mobility;Decreased activity tolerance;Decreased knowledge of use of DME;Decreased balance       PT Treatment Interventions Gait training;Therapeutic exercise;Patient/family education;Therapeutic activities;DME instruction;Stair training;Balance  training;Functional mobility training    PT Goals (Current goals can be found in the Care Plan section)  Acute Rehab PT Goals PT Goal Formulation: With patient Time For Goal Achievement: 12/24/17 Potential to Achieve Goals: Good    Frequency Min 3X/week   Barriers to discharge        Co-evaluation               AM-PAC PT "6 Clicks" Daily Activity  Outcome Measure Difficulty turning over in bed (including adjusting bedclothes, sheets and blankets)?: A Little Difficulty moving from lying on back to sitting on the side of the bed? : Unable Difficulty sitting down on and standing up from a chair with arms (e.g., wheelchair, bedside commode, etc,.)?: Unable Help needed moving to and from a bed to chair (including a wheelchair)?: A Little Help needed walking in hospital room?: A Little Help needed climbing 3-5 steps with a railing? : A Lot 6 Click Score: 13    End of Session Equipment Utilized During Treatment: Gait belt Activity Tolerance: Patient tolerated treatment well Patient left: in chair;with chair alarm set;with call bell/phone within reach Nurse Communication: Mobility status PT Visit Diagnosis: Other abnormalities of gait and mobility (R26.89)    Time: 5631-4970 PT Time Calculation (min) (ACUTE ONLY): 24 min   Charges:   PT Evaluation $PT Eval Low Complexity: 1 Low     PT G CodesCarmelia Bake, PT, DPT 12/10/2017 Pager: [phone]  York Ram E 12/10/2017, 3:38 PM

## 2017-12-10 NOTE — Progress Notes (Signed)
PROGRESS NOTE    [name]  IDP:[id number] DOB: January 03, 1933 DOA: 12/05/2017 PCP: [name], MD    Brief Narrative:  [name] is a 83 y.o. female with medical history significant of CAD, diastolic CHF, pulmonary HTN, NSTEMI, HTN and atrial fibrillationon coumadin; reports having acute onset of chills and lower back pain, . She was admitted for sepsis from cellulitis of the left lower extremity.      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 Admitted to stepdown for hypotension, closer monitoring. Patient's blood pressure improved , keep her map her map is greater than 65.  She was started on broad-spectrum antibiotics with vancomycin and cefepime.  As her blood cultures showed Streptococcus we have narrowed her antibiotic to Rocephin.  Wound care consulted for bilateral lower extremity ulcers and recommendations given. She continues to do better.  She remains afebrile leukocytosis is improving.  Her lactic acid is normal at 1.1 echocardiogram ordered to evaluate for endocarditis. Echo does not show any endocarditis.  Narrow IV antibiotics to ampicillin oral on discharge.   Possible enteritis with ileus seen on CT abdomen pelvis   She denies any nausea vomiting or abdominal pain at this time. Advance diet as tolerated.  Currently she is on regular diet and is able to tolerate.   Hypotension  Resolved   Chronic atrial fibrillation with RVR Rates better after hydration On chronic anticoagulation with Coumadin.   Lumbar fractures, age indeterminate: Probably secondary to the fall few weeks ago.  Pain control and physical  therapy evaluation.   Mild diastolic heart failure acute on chronic Chest x-ray showed mild CHF she is got bilateral pedal edema and is requiring oxygen at this time. Resume Lasix as needed and if her bp tolerated.  Today, on exam she has crackles at bases. Repeat CXR today. And start her on IV lasix 20 mg daily.     Elevated troponins Probably from demand ischemia from sepsis. On admission, physician on call spoke with Dr. Percival Spanish cardiology and recommended no  ischemic workup at this time.  Patient denies any chest pain shortness of breath or presyncope.   Type 2 diabetes mellitus Last A1c is 5.5, holding metformin.  Check CBGs with sensitive scale insulin. CBG (last 3)  Recent Labs    12/09/17 2119 12/10/17 0828 12/10/17 1151  GLUCAP 127* 98 135*      History of chronic venous stasis ulcerations  On the left leg and right thigh. Mild cellulitis.  Wound care consulted and recommendations given   Possible hepatocellular carcinoma No workup as per the family as per the daughter at bedside.  Abnormal TSH Free T4 is normal.      DVT prophylaxis: COUMADIN.  Code Status: DNR Family Communication: none at bedside, discussed with daughter over the phone on 12/11.  Disposition Plan: Probably to SNF tomorrow.    Consultants:  SLP Wound care Phone consult with cardiology.   Procedures: none.   Antimicrobials: vancomycin and cefepiem since admission later narrowed to rocephin for streptococcus.    Subjective: Not feeling good. Reports not breathing good. Occasional cough.   Vitals:   12/10/17 0820 12/10/17 0900 12/10/17 1000 12/10/17 1200  BP:  (!) 100/56 130/80 102/61  Pulse:  91 100 85  Resp:  (!) 21 (!) 27 20  Temp: 98.6 F (37 C)   97.7 F (36.5 C)  TempSrc: Oral   Axillary  SpO2:  92% 94% 96%  Weight:      Height:        Intake/Output Summary (Last 24 hours) at 12/10/2017 1350 Last data filed at 12/10/2017 1052 Gross per 24 hour  Intake 530 ml    Output 1205 ml  Net -675 ml   Filed Weights   12/08/17 2000 12/09/17 2100 12/10/17 0400  Weight: 82.7 kg (182 lb 5.1 oz) 84 kg (185 lb 3 oz) 84 kg (185 lb 3 oz)    Examination:   General exam: , not in distress, off oxygen.  Respiratory system: Crackles at bases. Air entry fair, no wheezing.  Cardiovascular system: S1 & S2 heard, irregular, no JVD. Mild leg edema.  Gastrointestinal system: soft non tender non distended bowel sound s heard.  Central nervous system: Alert and oriented. Non focal.  Extremities: left lower extremity swelling with left pretibial lateral wound is 2cm x 0.8cm x 0.1cm.  The wound is bandaged and no drainage. Right thigh wound bandaged, tenderness improved. Erythema improved.  Skin: Left and right lower extremity  wound see above Psychiatry:  Mood & affect appropriate.     Data Reviewed: I have personally reviewed following labs and imaging studies  CBC: Recent Labs  Lab 12/05/17 1845 12/06/17 0135 12/07/17 0841 12/09/17 0328  WBC 21.7* 31.3* 21.3* 11.5*  NEUTROABS 20.4* 29.1* 19.3*  --   HGB 12.1 10.5* 10.3* 10.8*  HCT 37.2 33.0* 33.0* 34.0*  MCV 82.5 82.7 83.8 82.1  PLT 349 265 236 413   Basic Metabolic Panel: Recent Labs  Lab 12/05/17 1845 12/06/17 0135 12/07/17 0841 12/09/17 1201 12/10/17 0920  NA 133* 134* 132* 138  --   K 4.1 4.3 3.8 3.0* 3.7  CL 93* 99* 102 105  --   CO2 25 23 20* 23  --   GLUCOSE 140* 107* 115* 96  --   BUN 59* 56* 58* 44*  --   CREATININE 1.45* 1.63* 1.70* 1.15*  --   CALCIUM 9.0 7.9* 8.3* 8.7*  --    GFR: Estimated Creatinine Clearance: 44.5 mL/min (A) (by C-G formula based on SCr of 1.15 mg/dL (H)). Liver Function Tests: Recent Labs  Lab 12/06/17 0135  AST 55*  ALT 19  ALKPHOS 100  BILITOT 1.7*  PROT 6.0*  ALBUMIN 2.7*   No results for input(s): LIPASE, AMYLASE in the last 168 hours. No results for input(s): AMMONIA in the last 168 hours. Coagulation Profile: Recent Labs  Lab 12/06/17 0620  12/07/17 0321 12/08/17 0255 12/09/17 0328 12/10/17 0353  INR 2.47 3.22 3.50 3.47 3.71   Cardiac Enzymes: Recent Labs  Lab 12/06/17 0135  TROPONINI 1.89*   BNP (last 3 results) No results for input(s): PROBNP in the last 8760 hours. HbA1C: No results for input(s): HGBA1C in the last 72 hours. CBG: Recent Labs  Lab 12/09/17 1229 12/09/17 1607 12/09/17 2119 12/10/17 0828 12/10/17 1151  GLUCAP 73 116* 127* 98 135*   Lipid Profile: No results for input(s): CHOL, HDL, LDLCALC, TRIG, CHOLHDL, LDLDIRECT in the last 72 hours. Thyroid Function Tests: No results for input(s): TSH, T4TOTAL, FREET4, T3FREE, THYROIDAB in the last 72 hours. Anemia Panel: No results for input(s): VITAMINB12, FOLATE, FERRITIN, TIBC, IRON, RETICCTPCT in the last 72 hours. Sepsis Labs: Recent Labs  Lab 12/05/17 2023 12/05/17 2241 12/07/17 0841  LATICACIDVEN 2.02* 1.59 1.1    Recent Results (from the past 240 hour(s))  Blood culture (routine x 2)     Status: Abnormal   Collection Time: 12/05/17  8:56 PM  Result Value Ref Range Status   Specimen Description BLOOD LEFT ANTECUBITAL  Final   Special Requests   Final    BOTTLES DRAWN AEROBIC AND ANAEROBIC Blood Culture adequate volume   Culture  Setup Time   Final    GRAM POSITIVE COCCI IN CHAINS ANAEROBIC BOTTLE ONLY CRITICAL VALUE NOTED.  VALUE IS CONSISTENT WITH PREVIOUSLY REPORTED AND CALLED VALUE.    Culture (A)  Final    STREPTOCOCCUS DYSGALACTIAE SUSCEPTIBILITIES PERFORMED ON PREVIOUS CULTURE WITHIN THE LAST 5 DAYS. Performed at [facility], [place] [address] [place], [place] [address]    Report Status 12/08/2017 FINAL  Final  Blood culture (routine x 2)     Status: Abnormal   Collection Time: 12/05/17  9:01 PM  Result Value Ref Range Status   Specimen Description BLOOD BLOOD RIGHT FOREARM  Final   Special Requests   Final    BOTTLES DRAWN AEROBIC AND ANAEROBIC Blood Culture adequate volume   Culture  Setup Time   Final    GRAM  POSITIVE COCCI IN CHAINS IN BOTH AEROBIC AND ANAEROBIC BOTTLES CRITICAL RESULT CALLED TO, READ BACK BY AND VERIFIED WITH: EBurman Foster.D. 14:30 12/06/17 ([email or web address]) Performed at [facility], [place] [address] [place], [place] [address]    Culture STREPTOCOCCUS DYSGALACTIAE (A)  Final   Report Status 12/08/2017 FINAL  Final   Organism ID, Bacteria STREPTOCOCCUS DYSGALACTIAE  Final      Susceptibility   Streptococcus dysgalactiae - MIC*    CLINDAMYCIN >=1 RESISTANT Resistant     AMPICILLIN <=0.25 SENSITIVE Sensitive     ERYTHROMYCIN >=8 RESISTANT Resistant     VANCOMYCIN 0.25 SENSITIVE Sensitive     CEFTRIAXONE <=0.12 SENSITIVE Sensitive     LEVOFLOXACIN 0.5 SENSITIVE Sensitive     * STREPTOCOCCUS DYSGALACTIAE  Blood Culture ID Panel (Reflexed)     Status: Abnormal   Collection Time: 12/05/17  9:01 PM  Result Value Ref Range Status   Enterococcus species NOT DETECTED NOT DETECTED Final   Listeria monocytogenes NOT DETECTED NOT DETECTED Final   Staphylococcus species NOT DETECTED NOT DETECTED Final   Staphylococcus aureus NOT DETECTED NOT DETECTED Final   Streptococcus species DETECTED (A) NOT DETECTED Final    Comment: Not Enterococcus species, Streptococcus agalactiae, Streptococcus pyogenes, or Streptococcus pneumoniae. CRITICAL RESULT CALLED TO, READ BACK BY AND VERIFIED WITH: EBurman Foster.D. 14:30 12/06/17 ([email or web address])    Streptococcus agalactiae NOT DETECTED NOT DETECTED Final   Streptococcus pneumoniae NOT DETECTED NOT DETECTED Final   Streptococcus pyogenes NOT DETECTED NOT DETECTED Final   Acinetobacter baumannii NOT DETECTED NOT DETECTED Final   Enterobacteriaceae species NOT DETECTED NOT DETECTED Final   Enterobacter cloacae complex NOT DETECTED NOT DETECTED Final   Escherichia coli NOT DETECTED NOT DETECTED Final   Klebsiella oxytoca NOT DETECTED NOT DETECTED Final   Klebsiella pneumoniae NOT DETECTED NOT DETECTED Final   Proteus species NOT DETECTED NOT DETECTED  Final   Serratia marcescens NOT DETECTED NOT DETECTED Final   Haemophilus influenzae NOT DETECTED NOT DETECTED Final   Neisseria meningitidis NOT DETECTED NOT DETECTED Final   Pseudomonas aeruginosa NOT DETECTED NOT DETECTED Final   Candida albicans NOT DETECTED NOT DETECTED Final   Candida glabrata NOT DETECTED NOT DETECTED  Final   Candida krusei NOT DETECTED NOT DETECTED Final   Candida parapsilosis NOT DETECTED NOT DETECTED Final   Candida tropicalis NOT DETECTED NOT DETECTED Final    Comment: Performed at [facility], [place] [address] [place], [place] [address]  MRSA PCR Screening     Status: Abnormal   Collection Time: 12/06/17  1:20 AM  Result Value Ref Range Status   MRSA by PCR POSITIVE (A) NEGATIVE Final    Comment:        The GeneXpert MRSA Assay (FDA approved for NASAL specimens only), is one component of a comprehensive MRSA colonization surveillance program. It is not intended to diagnose MRSA infection nor to guide or monitor treatment for MRSA infections. RESULT CALLED TO, READ BACK BY AND VERIFIED WITH: SCOTT,B RN 12.7.18 @0515  ZANDO,C          Radiology Studies: No results found.      Scheduled Meds: . Chlorhexidine Gluconate Cloth  6 each Topical Q0600  . collagenase   Topical Daily  . furosemide  20 mg Intravenous Daily  . insulin aspart  0-9 Units Subcutaneous TID WC  . mupirocin ointment  1 application Nasal BID  . pantoprazole (PROTONIX) IV  40 mg Intravenous Q12H  . Warfarin - Pharmacist Dosing Inpatient   Does not apply q1800   Continuous Infusions: . cefTRIAXone (ROCEPHIN)  IV Stopped (12/09/17 2136)     LOS: 5 days    Time spent: 35 minutes.     Hosie Poisson, MD [facility] Pager [phone]  If 7PM-7AM, please contact night-coverage [email or web address] Password [email or web address] 12/10/2017, 1:50 PM

## 2017-12-10 NOTE — Care Management Important Message (Addendum)
Important Message  Patient Details IM Letter given to Kathy/Case Manager to present to the Patient Name: [name] MRN: [id number] Date of Birth: 26-Sep-1933   Medicare Important Message Given:  Yes    Kerin Salen 12/10/2017, 2:12 [place] Message  Patient Details  Name: [name] MRN: [id number] Date of Birth: [birth year]   Medicare Important Message Given:  Yes    Kerin Salen 12/10/2017, 2:12 PM

## 2017-12-10 NOTE — Progress Notes (Signed)
ANTICOAGULATION CONSULT NOTE - Follow Up Consult  Pharmacy Consult for Warfarin Indication: atrial fibrillation  Allergies  Allergen Reactions  . Ace Inhibitors Other (See Comments)    Intolerance per Dr. Doug Sou note  . Amlodipine Other (See Comments)    Intolerance per Dr. Doug Sou note   . Statins Other (See Comments)    Muscle soreness  . Sulfa Drugs Cross Reactors Itching  . Zetia [Ezetimibe] Other (See Comments)    Muscle soreness  . Fenofibrate Other (See Comments)    Muscle soreness    Patient Measurements: Height: 5\' 10"  (177.8 cm) Weight: 185 lb 3 oz (84 kg) IBW/kg (Calculated) : 68.5  Vital Signs: Temp: 98.1 F (36.7 C) (12/11 0330) Temp Source: Axillary (12/11 0330) BP: 97/39 (12/11 0800) Pulse Rate: 88 (12/11 0800)  Labs: Recent Labs    12/08/17 0255 12/09/17 0328 12/09/17 1201 12/10/17 0353  HGB  --  10.8*  --   --   HCT  --  34.0*  --   --   PLT  --  267  --   --   LABPROT 34.8* 34.6*  --  36.5*  INR 3.50 3.47  --  3.71  CREATININE  --   --  1.15*  --     Estimated Creatinine Clearance: 44.5 mL/min (A) (by C-G formula based on SCr of 1.15 mg/dL (H)).   Medications:  Scheduled:  . Chlorhexidine Gluconate Cloth  6 each Topical Q0600  . collagenase   Topical Daily  . furosemide  20 mg Oral Daily  . insulin aspart  0-9 Units Subcutaneous TID WC  . mupirocin ointment  1 application Nasal BID  . pantoprazole (PROTONIX) IV  40 mg Intravenous Q12H  . Warfarin - Pharmacist Dosing Inpatient   Does not apply q1800   Infusions:  . cefTRIAXone (ROCEPHIN)  IV Stopped (12/09/17 2136)    Assessment: 86 yoF presented from [facility] on 12/6 with chills and back pain.  PMH includes chronic anticoagulation on warfarin for afib.    PTA warfarin dose warfarin per NH note is 2.5mg  po daily, except 5mg  on Monday, Wednesday, Friday.  But per Med history is listed as 3.5 mg (2.5mg  tablet + 1 mg tablet) on Sunday, Tuesday, Thursday, Saturday. Admission  INR is therapeutic at 2.22  Clarified warfarin dosing: 3.5mg  daily except 5mg  Mon/Wed/Fri  Today, 12/10/2017: INR 3.71 - SUPRAtherapeutic (trending up). Last warfarin dose 12/7 CBC: Hgb sl improved 12/10, Plt remain WNL No bleeding or complications reported. Diet: Regular but no intake recorted No major drug-drug interactions identified. Atibiotics (ceftriaxone) may increase INR.  Goal of Therapy:  INR 2-3 Monitor platelets by anticoagulation protocol: Yes   Plan:   Hold warfarin again today.  Daily PT/INR.  Monitor for signs and symptoms of bleeding.  Doreene Eland, PharmD, BCPS.   Pager: [phone] 12/10/2017 8:54 AM

## 2017-12-11 ENCOUNTER — Inpatient Hospital Stay (HOSPITAL_COMMUNITY): Payer: Medicare Other

## 2017-12-11 DIAGNOSIS — A400 Sepsis due to streptococcus, group A: Secondary | ICD-10-CM

## 2017-12-11 DIAGNOSIS — L03115 Cellulitis of right lower limb: Secondary | ICD-10-CM

## 2017-12-11 DIAGNOSIS — L02415 Cutaneous abscess of right lower limb: Secondary | ICD-10-CM

## 2017-12-11 DIAGNOSIS — L0291 Cutaneous abscess, unspecified: Secondary | ICD-10-CM

## 2017-12-11 LAB — BASIC METABOLIC PANEL
Anion gap: 11 (ref 5–15)
BUN: 39 mg/dL — ABNORMAL HIGH (ref 6–20)
CALCIUM: 8.4 mg/dL — AB (ref 8.9–10.3)
CHLORIDE: 101 mmol/L (ref 101–111)
CO2: 22 mmol/L (ref 22–32)
CREATININE: 1.01 mg/dL — AB (ref 0.44–1.00)
GFR, EST AFRICAN AMERICAN: 58 mL/min — AB (ref >60)
GFR, EST NON AFRICAN AMERICAN: 50 mL/min — AB (ref >60)
Glucose, Bld: 101 mg/dL — ABNORMAL HIGH (ref 65–99)
Potassium: 3.3 mmol/L — ABNORMAL LOW (ref 3.5–5.1)
SODIUM: 134 mmol/L — AB (ref 135–145)

## 2017-12-11 LAB — GLUCOSE, CAPILLARY
GLUCOSE-CAPILLARY: 104 mg/dL — AB (ref 65–99)
GLUCOSE-CAPILLARY: 109 mg/dL — AB (ref 65–99)
GLUCOSE-CAPILLARY: 113 mg/dL — AB (ref 65–99)
GLUCOSE-CAPILLARY: 138 mg/dL — AB (ref 65–99)

## 2017-12-11 LAB — CBC
HCT: 36.3 % (ref 36.0–46.0)
Hemoglobin: 11.4 g/dL — ABNORMAL LOW (ref 12.0–15.0)
MCH: 26 pg (ref 26.0–34.0)
MCHC: 31.4 g/dL (ref 30.0–36.0)
MCV: 82.9 fL (ref 78.0–100.0)
PLATELETS: 299 10*3/uL (ref 150–400)
RBC: 4.38 MIL/uL (ref 3.87–5.11)
RDW: 18 % — ABNORMAL HIGH (ref 11.5–15.5)
WBC: 12.3 10*3/uL — AB (ref 4.0–10.5)

## 2017-12-11 LAB — PROTIME-INR
INR: 3.1
Prothrombin Time: 31.7 seconds — ABNORMAL HIGH (ref 11.4–15.2)

## 2017-12-11 MED ORDER — LEVALBUTEROL HCL 1.25 MG/0.5ML IN NEBU
1.2500 mg | INHALATION_SOLUTION | Freq: Once | RESPIRATORY_TRACT | Status: AC
  Administered 2017-12-11: 1.25 mg via RESPIRATORY_TRACT
  Filled 2017-12-11: qty 0.5

## 2017-12-11 MED ORDER — WARFARIN SODIUM 2 MG PO TABS
2.0000 mg | ORAL_TABLET | Freq: Once | ORAL | Status: AC
  Administered 2017-12-11: 2 mg via ORAL
  Filled 2017-12-11: qty 1

## 2017-12-11 MED ORDER — FUROSEMIDE 10 MG/ML IJ SOLN
40.0000 mg | Freq: Once | INTRAMUSCULAR | Status: AC
  Administered 2017-12-11: 40 mg via INTRAVENOUS
  Filled 2017-12-11: qty 4

## 2017-12-11 MED ORDER — FUROSEMIDE 10 MG/ML IJ SOLN
40.0000 mg | Freq: Every day | INTRAMUSCULAR | Status: DC
  Administered 2017-12-11: 40 mg via INTRAVENOUS
  Filled 2017-12-11 (×2): qty 4

## 2017-12-11 MED ORDER — LEVALBUTEROL HCL 1.25 MG/0.5ML IN NEBU
1.2500 mg | INHALATION_SOLUTION | Freq: Four times a day (QID) | RESPIRATORY_TRACT | Status: DC | PRN
  Administered 2017-12-11: 1.25 mg via RESPIRATORY_TRACT
  Filled 2017-12-11: qty 0.5

## 2017-12-11 MED ORDER — POTASSIUM CHLORIDE CRYS ER 20 MEQ PO TBCR
40.0000 meq | EXTENDED_RELEASE_TABLET | Freq: Once | ORAL | Status: AC
  Administered 2017-12-11: 40 meq via ORAL
  Filled 2017-12-11: qty 2

## 2017-12-11 NOTE — Progress Notes (Addendum)
PROGRESS NOTE  [name]  PIR:[id number] DOB: 1933-04-26 DOA: 12/05/2017 PCP: [name], MD  Brief Narrative:  Hagen Tidd Brownis a 83 y.o.femalewith medical history significant ofCAD, diastolic CHF, pulmonary HTN, NSTEMI, HTN and atrial fibrillationon coumadin;reports having acute onset of chillsand lower back pain. She was admitted for sepsis from cellulitis of the left lower extremity.   She was started on broad-spectrum antibiotics with vancomycin and cefepime.  The abscess on her medial right thigh was drained in the ER.  As her blood cultures showed Streptococcus we have narrowed her antibiotic to Rocephin.  Wound care was consulted for bilateral lower extremity ulcers.    Assessment & Plan:  Sepsis secondary to streptococcal bacteremia/cellulitis of bilateral lower extremity.  Hypotension has improved but she had transient hypotension yesterday to the 63K systolic.  Right thigh feels like there may be some recurrent fluctuance.  Drained by ER MD on 12/6.   -  Continue telemetry -  Continue ceftriaxone -  TTE demonstrated no evidence of endocarditis -  Repeat blood culture in setting of prolonged/recurrent hypotension.   -  If repeat Bcx positive, may need TEE -  Check cortisol level in AM -  [place] right thigh to eval for recurrent abscess.  If present, will need general surgery consultation  Possible enteritis with ileus seen on CT abdomen pelvis   She denies any nausea vomiting or abdominal pain at this time, but having frequent stools  Chronic atrial fibrillation, currently rate controlled -  Holding metoprolol due to hypotension -  Continue Coumadin, INR therapeutic  Lumbar fractures, age indeterminate, likely secondary to the fall few weeks ago.   - PT recommending SNF.  D/c to [facility] when medically stable  Acute on chronic diastolic heart failure, normally on metolazone three time weekly and torsemide 40mg  BID.  These medications have been  held since admission due to sepsis/hypotension, still with rales on exam.  Lasix held due to hypotension -  Give lasix 40mg  IV once today and closely monitor BP   Elevated troponins, probably from demand ischemia from sepsis. On admission, physician on call spoke with Dr. Percival Spanish cardiology and recommended no ischemic workup at this time.  Patient denies any chest pain shortness of breath or presyncope.  Type 2 diabetes mellitus, last A1c is 5.5, holding metformin.  not requiring much insulin and at risk for hypoglycemia -  D/c SSI  Chronic venous stasis ulcerations Wound care consulted and recommendations given  Possible hepatocellular carcinoma No workup as per the family as per the daughter at bedside.  Sick euthyroid, repeat TFTs in 4 weeks.   DVT prophylaxis:  coumadin Code Status:  DNR Family Communication:  Patient alone, no family at bedside Disposition Plan:  To SNF in a few days.  Needs additional diuresis and monitoring for hypotension.     Consultants:  SLP Wound care Phone consult with cardiology.   Procedures:  I&D right thigh abscess by ER MD on 12/6  Antimicrobials:  Anti-infectives (From admission, onward)   Start     Dose/Rate Route Frequency Ordered Stop   12/07/17 1200  vancomycin (VANCOCIN) IVPB 1000 mg/200 mL premix  Status:  Discontinued     1,000 mg 200 mL/hr over 60 Minutes Intravenous Every 36 hours 12/06/17 0536 12/06/17 1513   12/06/17 2000  cefTRIAXone (ROCEPHIN) 2 g in dextrose 5 % 50 mL IVPB     2 g 100 mL/hr over 30 Minutes Intravenous Every 24 hours 12/06/17 1513  12/06/17 0600  piperacillin-tazobactam (ZOSYN) IVPB 3.375 g  Status:  Discontinued     3.375 g 12.5 mL/hr over 240 Minutes Intravenous Every 8 hours 12/06/17 0534 12/06/17 1513   12/05/17 2100  vancomycin (VANCOCIN) 1,500 mg in sodium chloride 0.9 % 500 mL IVPB     1,500 mg 250 mL/hr over 120 Minutes Intravenous  Once 12/05/17 2055 12/06/17 0026   12/05/17 2100   piperacillin-tazobactam (ZOSYN) IVPB 3.375 g     3.375 g 100 mL/hr over 30 Minutes Intravenous  Once 12/05/17 2055 12/05/17 2145       Subjective: States that she feels confused.  She denies chest pains but feels that she is having difficulty breathing and needs her fluid pills.  She denies lightheadedness or dizziness.  She would like to go to friend's home as soon as possible.  States that she is not sleeping well.  Objective: Vitals:   12/11/17 0620 12/11/17 0656 12/11/17 0926 12/11/17 1412  BP:   (!) 101/59 (!) 112/59  Pulse:   87 85  Resp:      Temp:      TempSrc:      SpO2: 98% 100% 97% 96%  Weight:      Height:        Intake/Output Summary (Last 24 hours) at 12/11/2017 1442 Last data filed at 12/11/2017 0600 Gross per 24 hour  Intake 350 ml  Output -  Net 350 ml   Filed Weights   12/09/17 2100 12/10/17 0400 12/11/17 0432  Weight: 84 kg (185 lb 3 oz) 84 kg (185 lb 3 oz) 83.3 kg (183 lb 10.3 oz)    Examination:  General exam:  Adult female, pleasantly confused.  No acute distress.  HEENT:  NCAT, MMM Respiratory system: Wheezing, rales at the bilateral bases to the mid back, rhonchorous Cardiovascular system: Irregular rate and rhythm, normal S1/S2. No murmurs, rubs, gallops or clicks.  Warm extremities Gastrointestinal system: Normal active bowel sounds, soft, nondistended, nontender. MSK:  Normal tone and bulk: Left lower extremity shin with a 3cm ulcerated area with erythema that extends to above the level of the knee on the lateral aspect of her leg and down to include the ulcerated area.  Her right medial thigh has an area that is about 2 cm in diameter with some overlying erythema and crusting the feels somewhat fluctuant and indurated. Neuro:  Grossly moves all extremities Psych: Alert and oriented to person only    Data Reviewed: I have personally reviewed following labs and imaging studies  CBC: Recent Labs  Lab 12/05/17 1845 12/06/17 0135  12/07/17 0841 12/09/17 0328 12/11/17 0457  WBC 21.7* 31.3* 21.3* 11.5* 12.3*  NEUTROABS 20.4* 29.1* 19.3*  --   --   HGB 12.1 10.5* 10.3* 10.8* 11.4*  HCT 37.2 33.0* 33.0* 34.0* 36.3  MCV 82.5 82.7 83.8 82.1 82.9  PLT 349 265 236 267 191   Basic Metabolic Panel: Recent Labs  Lab 12/05/17 1845 12/06/17 0135 12/07/17 0841 12/09/17 1201 12/10/17 0920 12/11/17 0457  NA 133* 134* 132* 138  --  134*  K 4.1 4.3 3.8 3.0* 3.7 3.3*  CL 93* 99* 102 105  --  101  CO2 25 23 20* 23  --  22  GLUCOSE 140* 107* 115* 96  --  101*  BUN 59* 56* 58* 44*  --  39*  CREATININE 1.45* 1.63* 1.70* 1.15*  --  1.01*  CALCIUM 9.0 7.9* 8.3* 8.7*  --  8.4*   GFR: Estimated Creatinine  Clearance: 50.4 mL/min (A) (by C-G formula based on SCr of 1.01 mg/dL (H)). Liver Function Tests: Recent Labs  Lab 12/06/17 0135  AST 55*  ALT 19  ALKPHOS 100  BILITOT 1.7*  PROT 6.0*  ALBUMIN 2.7*   No results for input(s): LIPASE, AMYLASE in the last 168 hours. No results for input(s): AMMONIA in the last 168 hours. Coagulation Profile: Recent Labs  Lab 12/07/17 0321 12/08/17 0255 12/09/17 0328 12/10/17 0353 12/11/17 0457  INR 3.22 3.50 3.47 3.71 3.10   Cardiac Enzymes: Recent Labs  Lab 12/06/17 0135  TROPONINI 1.89*   BNP (last 3 results) No results for input(s): PROBNP in the last 8760 hours. HbA1C: No results for input(s): HGBA1C in the last 72 hours. CBG: Recent Labs  Lab 12/10/17 1151 12/10/17 1819 12/10/17 2102 12/11/17 0733 12/11/17 1153  GLUCAP 135* 130* 135* 104* 109*   Lipid Profile: No results for input(s): CHOL, HDL, LDLCALC, TRIG, CHOLHDL, LDLDIRECT in the last 72 hours. Thyroid Function Tests: No results for input(s): TSH, T4TOTAL, FREET4, T3FREE, THYROIDAB in the last 72 hours. Anemia Panel: No results for input(s): VITAMINB12, FOLATE, FERRITIN, TIBC, IRON, RETICCTPCT in the last 72 hours. Urine analysis:    Component Value Date/Time   COLORURINE YELLOW 12/05/2017 2028    APPEARANCEUR CLEAR 12/05/2017 2028   LABSPEC 1.015 12/05/2017 2028   PHURINE 5.0 12/05/2017 2028   GLUCOSEU NEGATIVE 12/05/2017 2028   HGBUR NEGATIVE 12/05/2017 2028   BILIRUBINUR NEGATIVE 12/05/2017 2028   KETONESUR NEGATIVE 12/05/2017 2028   PROTEINUR 30 (A) 12/05/2017 2028   UROBILINOGEN 0.2 02/03/2013 1217   NITRITE NEGATIVE 12/05/2017 2028   LEUKOCYTESUR NEGATIVE 12/05/2017 2028   Sepsis Labs: @LABRCNTIP (procalcitonin:4,lacticidven:4)  ) Recent Results (from the past 240 hour(s))  Blood culture (routine x 2)     Status: Abnormal   Collection Time: 12/05/17  8:56 PM  Result Value Ref Range Status   Specimen Description BLOOD LEFT ANTECUBITAL  Final   Special Requests   Final    BOTTLES DRAWN AEROBIC AND ANAEROBIC Blood Culture adequate volume   Culture  Setup Time   Final    GRAM POSITIVE COCCI IN CHAINS ANAEROBIC BOTTLE ONLY CRITICAL VALUE NOTED.  VALUE IS CONSISTENT WITH PREVIOUSLY REPORTED AND CALLED VALUE.    Culture (A)  Final    STREPTOCOCCUS DYSGALACTIAE SUSCEPTIBILITIES PERFORMED ON PREVIOUS CULTURE WITHIN THE LAST 5 DAYS. Performed at [facility], [place] [address], [place], [place] [address]    Report Status 12/08/2017 FINAL  Final  Blood culture (routine x 2)     Status: Abnormal   Collection Time: 12/05/17  9:01 PM  Result Value Ref Range Status   Specimen Description BLOOD BLOOD RIGHT FOREARM  Final   Special Requests   Final    BOTTLES DRAWN AEROBIC AND ANAEROBIC Blood Culture adequate volume   Culture  Setup Time   Final    GRAM POSITIVE COCCI IN CHAINS IN BOTH AEROBIC AND ANAEROBIC BOTTLES CRITICAL RESULT CALLED TO, READ BACK BY AND VERIFIED WITH: EBurman Foster.D. 14:30 12/06/17 ([email or web address]) Performed at [facility], [place] [address], [place], [place] [address]    Culture STREPTOCOCCUS DYSGALACTIAE (A)  Final   Report Status 12/08/2017 FINAL  Final   Organism ID, Bacteria STREPTOCOCCUS DYSGALACTIAE  Final      Susceptibility    Streptococcus dysgalactiae - MIC*    CLINDAMYCIN >=1 RESISTANT Resistant     AMPICILLIN <=0.25 SENSITIVE Sensitive     ERYTHROMYCIN >=8 RESISTANT Resistant  VANCOMYCIN 0.25 SENSITIVE Sensitive     CEFTRIAXONE <=0.12 SENSITIVE Sensitive     LEVOFLOXACIN 0.5 SENSITIVE Sensitive     * STREPTOCOCCUS DYSGALACTIAE  Blood Culture ID Panel (Reflexed)     Status: Abnormal   Collection Time: 12/05/17  9:01 PM  Result Value Ref Range Status   Enterococcus species NOT DETECTED NOT DETECTED Final   Listeria monocytogenes NOT DETECTED NOT DETECTED Final   Staphylococcus species NOT DETECTED NOT DETECTED Final   Staphylococcus aureus NOT DETECTED NOT DETECTED Final   Streptococcus species DETECTED (A) NOT DETECTED Final    Comment: Not Enterococcus species, Streptococcus agalactiae, Streptococcus pyogenes, or Streptococcus pneumoniae. CRITICAL RESULT CALLED TO, READ BACK BY AND VERIFIED WITH: EBurman Foster.D. 14:30 12/06/17 ([email or web address])    Streptococcus agalactiae NOT DETECTED NOT DETECTED Final   Streptococcus pneumoniae NOT DETECTED NOT DETECTED Final   Streptococcus pyogenes NOT DETECTED NOT DETECTED Final   Acinetobacter baumannii NOT DETECTED NOT DETECTED Final   Enterobacteriaceae species NOT DETECTED NOT DETECTED Final   Enterobacter cloacae complex NOT DETECTED NOT DETECTED Final   Escherichia coli NOT DETECTED NOT DETECTED Final   Klebsiella oxytoca NOT DETECTED NOT DETECTED Final   Klebsiella pneumoniae NOT DETECTED NOT DETECTED Final   Proteus species NOT DETECTED NOT DETECTED Final   Serratia marcescens NOT DETECTED NOT DETECTED Final   Haemophilus influenzae NOT DETECTED NOT DETECTED Final   Neisseria meningitidis NOT DETECTED NOT DETECTED Final   Pseudomonas aeruginosa NOT DETECTED NOT DETECTED Final   Candida albicans NOT DETECTED NOT DETECTED Final   Candida glabrata NOT DETECTED NOT DETECTED Final   Candida krusei NOT DETECTED NOT DETECTED Final   Candida parapsilosis NOT  DETECTED NOT DETECTED Final   Candida tropicalis NOT DETECTED NOT DETECTED Final    Comment: Performed at [facility], [place] [address] [place], [place] [address]  MRSA PCR Screening     Status: Abnormal   Collection Time: 12/06/17  1:20 AM  Result Value Ref Range Status   MRSA by PCR POSITIVE (A) NEGATIVE Final    Comment:        The GeneXpert MRSA Assay (FDA approved for NASAL specimens only), is one component of a comprehensive MRSA colonization surveillance program. It is not intended to diagnose MRSA infection nor to guide or monitor treatment for MRSA infections. RESULT CALLED TO, READ BACK BY AND VERIFIED WITH: SCOTT,B RN 12.7.18 @0515  ZANDO,C       Radiology Studies: Dg Chest 2 View  Result Date: 12/10/2017 CLINICAL DATA:  Followup.  Cough EXAM: CHEST  2 VIEW COMPARISON:  12/05/2017 FINDINGS: Small left more than right pleural effusions that are layering. Low volume chest with vascular congestion. Chronic cardiomegaly. Status post CABG and coronary stenting. No air bronchogram. No pneumothorax. IMPRESSION: 1. Increased but still small pleural effusions. 2. Cardiomegaly and vascular congestion. Electronically Signed   By: Monte Fantasia M.D.   On: 12/10/2017 16:14     Scheduled Meds: . collagenase   Topical Daily  . furosemide  40 mg Intravenous Daily  . insulin aspart  0-9 Units Subcutaneous TID WC  . pantoprazole (PROTONIX) IV  40 mg Intravenous Q12H  . warfarin  2 mg Oral ONCE-1800  . Warfarin - Pharmacist Dosing Inpatient   Does not apply q1800   Continuous Infusions: . cefTRIAXone (ROCEPHIN)  IV Stopped (12/10/17 2122)     LOS: 6 days    Time spent: 30 min    Janece Canterbury, MD [facility] Pager [phone]  If  7PM-7AM, please contact night-coverage [email or web address] Password [email or web address] 12/11/2017, 2:42 PM

## 2017-12-11 NOTE — Progress Notes (Signed)
Patient c/o SOB this AM, placed on 2L oxygen through nasal cannula. o2 Sats are 98%. Expiratory wheezing upon auscultation. Lamar Blinks NP paged. Will continue to monitor.

## 2017-12-11 NOTE — Progress Notes (Signed)
ANTICOAGULATION CONSULT NOTE - Follow Up Consult  Pharmacy Consult for Warfarin Indication: atrial fibrillation  Allergies  Allergen Reactions  . Ace Inhibitors Other (See Comments)    Intolerance per Dr. Doug Sou note  . Amlodipine Other (See Comments)    Intolerance per Dr. Doug Sou note   . Statins Other (See Comments)    Muscle soreness  . Sulfa Drugs Cross Reactors Itching  . Zetia [Ezetimibe] Other (See Comments)    Muscle soreness  . Fenofibrate Other (See Comments)    Muscle soreness    Patient Measurements: Height: 5\' 10"  (177.8 cm) Weight: 183 lb 10.3 oz (83.3 kg) IBW/kg (Calculated) : 68.5  Vital Signs: Temp: 98 F (36.7 C) (12/12 0429) Temp Source: Oral (12/12 0429) BP: 102/69 (12/12 0429) Pulse Rate: 102 (12/12 0429)  Labs: Recent Labs    12/09/17 0328 12/09/17 1201 12/10/17 0353 12/11/17 0457  HGB 10.8*  --   --  11.4*  HCT 34.0*  --   --  36.3  PLT 267  --   --  299  LABPROT 34.6*  --  36.5* 31.7*  INR 3.47  --  3.71 3.10  CREATININE  --  1.15*  --  1.01*    Estimated Creatinine Clearance: 50.4 mL/min (A) (by C-G formula based on SCr of 1.01 mg/dL (H)).   Medications:  Scheduled:  . collagenase   Topical Daily  . furosemide  40 mg Intravenous Daily  . insulin aspart  0-9 Units Subcutaneous TID WC  . pantoprazole (PROTONIX) IV  40 mg Intravenous Q12H  . potassium chloride  40 mEq Oral Once  . Warfarin - Pharmacist Dosing Inpatient   Does not apply q1800   Infusions:  . cefTRIAXone (ROCEPHIN)  IV Stopped (12/10/17 2122)    Assessment: 29 yoF presented from [facility] on 12/6 with chills and back pain.  PMH includes chronic anticoagulation on warfarin for afib.    PTA warfarin dose warfarin per NH note is 2.5mg  po daily, except 5mg  on Monday, Wednesday, Friday.  But per Med history is listed as 3.5 mg (2.5mg  tablet + 1 mg tablet) on Sunday, Tuesday, Thursday, Saturday. Admission INR is therapeutic at 2.22  Clarified warfarin dosing:  3.5mg  daily except 5mg  Mon/Wed/Fri  Today, 12/11/2017: 1) INR still SUPRAtherapeutic but finally dropping with last warfarin dose 12/7 2) CBC: stable 3) No bleeding or complications reported. 4) Diet: Regular but no intake recorted 5) No major drug-drug interactions identified. Atibiotics (ceftriaxone) may increase INR.  Goal of Therapy:  INR 2-3 Monitor platelets by anticoagulation protocol: Yes   Plan:   Will restart warfarin today at lower than home dose in anticipation of further INR drop tomorrow AM - 2mg   Daily PT/INR.  Monitor for signs and symptoms of bleeding.   Adrian Saran, PharmD, BCPS Pager [phone] 12/11/2017 9:08 AM

## 2017-12-12 ENCOUNTER — Inpatient Hospital Stay (HOSPITAL_COMMUNITY): Payer: Medicare Other

## 2017-12-12 DIAGNOSIS — I5033 Acute on chronic diastolic (congestive) heart failure: Secondary | ICD-10-CM

## 2017-12-12 LAB — URINALYSIS, ROUTINE W REFLEX MICROSCOPIC
Bilirubin Urine: NEGATIVE
Glucose, UA: NEGATIVE mg/dL
Hgb urine dipstick: NEGATIVE
Ketones, ur: NEGATIVE mg/dL
Nitrite: NEGATIVE
PROTEIN: NEGATIVE mg/dL
Specific Gravity, Urine: 1.005 (ref 1.005–1.030)
pH: 5 (ref 5.0–8.0)

## 2017-12-12 LAB — BASIC METABOLIC PANEL
ANION GAP: 11 (ref 5–15)
BUN: 37 mg/dL — ABNORMAL HIGH (ref 6–20)
CALCIUM: 8.5 mg/dL — AB (ref 8.9–10.3)
CO2: 26 mmol/L (ref 22–32)
CREATININE: 0.99 mg/dL (ref 0.44–1.00)
Chloride: 100 mmol/L — ABNORMAL LOW (ref 101–111)
GFR calc Af Amer: 60 mL/min — ABNORMAL LOW (ref >60)
GFR calc non Af Amer: 52 mL/min — ABNORMAL LOW (ref >60)
GLUCOSE: 104 mg/dL — AB (ref 65–99)
Potassium: 3.3 mmol/L — ABNORMAL LOW (ref 3.5–5.1)
Sodium: 137 mmol/L (ref 135–145)

## 2017-12-12 LAB — GLUCOSE, CAPILLARY
GLUCOSE-CAPILLARY: 95 mg/dL (ref 65–99)
Glucose-Capillary: 84 mg/dL (ref 65–99)
Glucose-Capillary: 93 mg/dL (ref 65–99)

## 2017-12-12 LAB — PROTIME-INR
INR: 2.35
PROTHROMBIN TIME: 25.6 s — AB (ref 11.4–15.2)

## 2017-12-12 LAB — CBC
HCT: 35.8 % — ABNORMAL LOW (ref 36.0–46.0)
HEMOGLOBIN: 11.2 g/dL — AB (ref 12.0–15.0)
MCH: 25.9 pg — AB (ref 26.0–34.0)
MCHC: 31.3 g/dL (ref 30.0–36.0)
MCV: 82.7 fL (ref 78.0–100.0)
Platelets: 324 10*3/uL (ref 150–400)
RBC: 4.33 MIL/uL (ref 3.87–5.11)
RDW: 17.9 % — ABNORMAL HIGH (ref 11.5–15.5)
WBC: 13.4 10*3/uL — ABNORMAL HIGH (ref 4.0–10.5)

## 2017-12-12 LAB — CORTISOL: Cortisol, Plasma: 17.5 ug/dL

## 2017-12-12 MED ORDER — FUROSEMIDE 10 MG/ML IJ SOLN
40.0000 mg | Freq: Two times a day (BID) | INTRAMUSCULAR | Status: DC
  Administered 2017-12-12 – 2017-12-14 (×5): 40 mg via INTRAVENOUS
  Filled 2017-12-12 (×5): qty 4

## 2017-12-12 MED ORDER — POTASSIUM CHLORIDE CRYS ER 20 MEQ PO TBCR
40.0000 meq | EXTENDED_RELEASE_TABLET | Freq: Once | ORAL | Status: AC
  Administered 2017-12-12: 40 meq via ORAL
  Filled 2017-12-12: qty 2

## 2017-12-12 MED ORDER — ENSURE ENLIVE PO LIQD
237.0000 mL | Freq: Two times a day (BID) | ORAL | Status: DC
  Administered 2017-12-13 – 2017-12-14 (×3): 237 mL via ORAL

## 2017-12-12 MED ORDER — ADULT MULTIVITAMIN W/MINERALS CH
1.0000 | ORAL_TABLET | Freq: Every day | ORAL | Status: DC
  Administered 2017-12-12 – 2017-12-14 (×3): 1 via ORAL
  Filled 2017-12-12 (×2): qty 1

## 2017-12-12 MED ORDER — WARFARIN SODIUM 2.5 MG PO TABS
2.5000 mg | ORAL_TABLET | Freq: Once | ORAL | Status: AC
  Administered 2017-12-12: 2.5 mg via ORAL
  Filled 2017-12-12: qty 1

## 2017-12-12 MED ORDER — METOLAZONE 2.5 MG PO TABS
2.5000 mg | ORAL_TABLET | Freq: Once | ORAL | Status: AC
  Administered 2017-12-12: 2.5 mg via ORAL
  Filled 2017-12-12: qty 1

## 2017-12-12 NOTE — Progress Notes (Signed)
ANTICOAGULATION CONSULT NOTE - Follow Up Consult  Pharmacy Consult for Warfarin Indication: atrial fibrillation  Allergies  Allergen Reactions  . Ace Inhibitors Other (See Comments)    Intolerance per Dr. Doug Sou note  . Amlodipine Other (See Comments)    Intolerance per Dr. Doug Sou note   . Statins Other (See Comments)    Muscle soreness  . Sulfa Drugs Cross Reactors Itching  . Zetia [Ezetimibe] Other (See Comments)    Muscle soreness  . Fenofibrate Other (See Comments)    Muscle soreness    Patient Measurements: Height: 5\' 10"  (177.8 cm) Weight: 183 lb 10.3 oz (83.3 kg) IBW/kg (Calculated) : 68.5  Vital Signs: Temp: 98 F (36.7 C) (12/13 0300) Temp Source: Oral (12/13 0300) BP: 118/62 (12/13 0300) Pulse Rate: 99 (12/13 0300)  Labs: Recent Labs    12/09/17 1201 12/10/17 0353 12/11/17 0457 12/12/17 0519  HGB  --   --  11.4* 11.2*  HCT  --   --  36.3 35.8*  PLT  --   --  299 324  LABPROT  --  36.5* 31.7* 25.6*  INR  --  3.71 3.10 2.35  CREATININE 1.15*  --  1.01* 0.99    Estimated Creatinine Clearance: 51.5 mL/min (by C-G formula based on SCr of 0.99 mg/dL).   Medications:  Scheduled:  . collagenase   Topical Daily  . furosemide  40 mg Intravenous BID WC  . insulin aspart  0-9 Units Subcutaneous TID WC  . pantoprazole (PROTONIX) IV  40 mg Intravenous Q12H  . Warfarin - Pharmacist Dosing Inpatient   Does not apply q1800   Infusions:  . cefTRIAXone (ROCEPHIN)  IV Stopped (12/11/17 2207)    Assessment: 70 yoF presented from [facility] on 12/6 with chills and back pain.  PMH includes chronic anticoagulation on warfarin for afib.    PTA warfarin dose warfarin per NH note is 2.5mg  po daily, except 5mg  on Monday, Wednesday, Friday.  But per Med history is listed as 3.5 mg (2.5mg  tablet + 1 mg tablet) on Sunday, Tuesday, Thursday, Saturday. Admission INR is therapeutic at 2.22  Clarified warfarin dosing: 3.5mg  daily except 5mg  Mon/Wed/Fri  Today,  12/12/2017: -  INR down to within therapeutic range at 2.35 (with dose held 12/9-12/11 and restarted back at 2 mg on 12/12) -  CBC: stable -  No bleeding documented -  Diet: Regular but no intake recorded -  No major drug-drug interactions identified. Atibiotics (ceftriaxone) can make patient more sensitive to warfarin  Goal of Therapy:  INR 2-3 Monitor platelets by anticoagulation protocol: Yes   Plan:  - warfarin 2.5 mg PO x1 today - Daily PT/INR. - Monitor for signs and symptoms of bleeding.   Dia Sitter, PharmD, BCPS 12/12/2017 11:52 AM

## 2017-12-12 NOTE — Progress Notes (Signed)
Physical Therapy Treatment Patient Details Name: [name] MRN: [id number] DOB: 1933/12/19 Today's Date: 12/12/2017    History of Present Illness 83 y.o. female with medical history significant of scoliosis, spinal stenosis, CAD, diastolic CHF, pulmonary HTN, NSTEMI, HTN and atrial fibrillation on coumadin. She was admitted for sepsis from cellulitis of the left lower extremit (also hx of chronic venous stasis ulcerations)     PT Comments    Pt more confused today then previous session.  Pt requesting assist to use BSC, declined ambulation, so assisted back to bed.   Follow Up Recommendations  SNF;Supervision for mobility/OOB     Equipment Recommendations  None recommended by PT    Recommendations for Other Services       Precautions / Restrictions Precautions Precautions: Fall    Mobility  Bed Mobility Overal bed mobility: Needs Assistance Bed Mobility: Supine to Sit     Supine to sit: Min assist     General bed mobility comments: pt sitting on EOB on arrival, therapist left to obtain linen and pt supine, required min assist for trunk upright however have seen pt performing her own bed mobility when passing by room today  Transfers Overall transfer level: Needs assistance Equipment used: None Transfers: Sit to/from [facility] Sit to Stand: Min assist Stand pivot transfers: Min assist       General transfer comment: verbal cues for hand placement and safe technique, pt holding onto armrests for support, assist for steadying  Ambulation/Gait                 Stairs            Wheelchair Mobility    Modified Rankin (Stroke Patients Only)       Balance Overall balance assessment: Needs assistance         Standing balance support: Bilateral upper extremity supported Standing balance-Leahy Scale: Poor Standing balance comment: requires UE support                            Cognition Arousal/Alertness:  Awake/alert Behavior During Therapy: Restless Overall Cognitive Status: No family/caregiver present to determine baseline cognitive functioning Area of Impairment: Memory                               General Comments: pt sitting EOB on arrival, states she doesn't know why she is here or what's going on, reorientated to place, situation, and date; more confused then previous session      Exercises      General Comments        Pertinent Vitals/Pain Pain Assessment: No/denies pain    Home Living                      Prior Function            PT Goals (current goals can now be found in the care plan section) Progress towards PT goals: Progressing toward goals    Frequency    Min 3X/week      PT Plan Current plan remains appropriate    Co-evaluation              AM-PAC PT "6 Clicks" Daily Activity  Outcome Measure  Difficulty turning over in bed (including adjusting bedclothes, sheets and blankets)?: A Little Difficulty moving from lying on back to sitting on the side of the bed? :  Unable Difficulty sitting down on and standing up from a chair with arms (e.g., wheelchair, bedside commode, etc,.)?: Unable Help needed moving to and from a bed to chair (including a wheelchair)?: A Little Help needed walking in hospital room?: A Lot Help needed climbing 3-5 steps with a railing? : A Lot 6 Click Score: 12    End of Session   Activity Tolerance: Patient limited by fatigue Patient left: in bed;with bed alarm set;with call bell/phone within reach Nurse Communication: Mobility status PT Visit Diagnosis: Other abnormalities of gait and mobility (R26.89)     Time: 4536-4680 PT Time Calculation (min) (ACUTE ONLY): 11 min  Charges:  $Therapeutic Activity: 8-22 mins                    G Codes:       Carmelia Bake, PT, DPT 12/12/2017 Pager: [phone]  York Ram E 12/12/2017, 1:34 PM

## 2017-12-12 NOTE — Progress Notes (Signed)
Patient voided 200 ml clear yellow urine.  Patient bladder scan showed 50 ml left in bladder.

## 2017-12-12 NOTE — Progress Notes (Signed)
Nutrition Follow-up  DOCUMENTATION CODES:   Not applicable  INTERVENTION:  - Will order Ensure Enlive po BID, each supplement provides 350 kcal and 20 grams of protein. - Will order Magic Cup BID with lunch and dinner meals, each supplement provides 290 kcal and 9 grams of protein. - Will order daily multivitamin with minerals.  - Continue to encourage PO intakes of meals and supplements.   NUTRITION DIAGNOSIS:   Inadequate oral intake related to lethargy/confusion, decreased appetite as evidenced by meal completion < 50%, other (comment)(report from tech). -revised, ongoing  GOAL:   Patient will meet greater than or equal to 90% of their needs -unmet  MONITOR:   PO intake, Supplement acceptance, Weight trends, Labs, Skin  ASSESSMENT:   83 y.o. female with medical history significant of  CAD, diastolic CHF, pulmonary HTN, NSTEMI, HTN and atrial fibrillation. Patient reports having acute onset of chills and lower back pain. Patient was somewhat of a poor historian. The patient was recently admitted for lower extremity cellulitis. Today (12/6) patient was transferred from [facility] with acute complaints of a burning lower back pain on the left side and chills. Pain in her lower back was constant and reported as severe. Associated symptoms included generalized malaise, weakness, and thirst. Nursing staff reported patient having a fall approximately 3 weeks ago, but denied any pain at that time  12/13 Per chart review, pt consumed 75% of lunch on 12/9 and 25% of breakfast on 12/11 with no other intakes documented. Pt sleeping at this time with no family/visitors present. Spoke with tech who had pt yesterday and today and reports that pt does best with breakfast. She states that pt seems to get distracted easily and that she has periods of increased confusion with mainly being a/o to self and place consistently. Tech reports that pt likes sweet items and that for lunch today she only  wanted to order a cookie and ice cream. Talked with tech about supplements and she feels pt will do well with Ensure and Magic Cup given sweet flavors. Weight has been stable since 12/8.  Medications reviewed; 40 mg IV Lasix BID, 40 mg IV Protonix BID, 40 mEq oral KCl x1 dose today. Labs reviewed; CBGs: 84 and 93 mg/dL today, K: 3.3 mmol/L, Cl: 100 mmol/L, BUN: 37 mg/dL, Ca: 8.5 mg/dL, GFR: 52 mL/min.      12/7 - Pt has been NPO today.  - She is requesting coffee and chips and stating dry mouth. - She reports she last ate yesterday afternoon but is unable to give further detail.  - Unable to obtain much other information from pt at this time, other than pain to ULE.  - Flowsheet indicates she is a/o to self and time only.  - Per chart review, pt weighed 201 lbs on 10/8 which would indicate 28 lb weight loss (14% body weight) in the past 2 months.  - This is significant for time frame.  - Unable to identify malnutrition at this time based on other factors.   IVF: NS @ 100 mL/hr.       Diet Order:  Diet regular Room service appropriate? Yes; Fluid consistency: Thin  EDUCATION NEEDS:   No education needs have been identified at this time  Skin:  Skin Assessment: Skin Integrity Issues: Skin Integrity Issues:: Diabetic Ulcer, Other (Comment) Diabetic Ulcer: Bilateral legs Other: Wound to R leg  Last BM:  12/11  Height:   Ht Readings from Last 1 Encounters:  12/06/17 5\' 10"  (1.778  m)    Weight:   Wt Readings from Last 1 Encounters:  12/12/17 183 lb 10.3 oz (83.3 kg)    Ideal Body Weight:  68.18 kg  BMI:  Body mass index is 26.35 kg/m.  Estimated Nutritional Needs:   Kcal:  1575-1815 (20-23 kcal/kg)  Protein:  65-75 grams  Fluid:  >/= 1.8 L/day      Jarome Matin, MS, RD, LDN, [facility] Inpatient Clinical Dietitian Pager # [phone] After hours/weekend pager # [phone]

## 2017-12-12 NOTE — Progress Notes (Signed)
Physical Therapy Treatment Patient Details Name: [name] MRN: [id number] DOB: 15-Jun-1933 Today's Date: 12/12/2017    History of Present Illness 83 y.o. female with medical history significant of scoliosis, spinal stenosis, CAD, diastolic CHF, pulmonary HTN, NSTEMI, HTN and atrial fibrillation on coumadin. She was admitted for sepsis from cellulitis of the left lower extremit (also hx of chronic venous stasis ulcerations)     PT Comments    Bed alarm sounding. Pt requesting to get to bathroom. No one available to assist so therapist assisted pt to and from bsc. Assisted pt back to bed and rest alarm.    Follow Up Recommendations  SNF     Equipment Recommendations  None recommended by PT    Recommendations for Other Services       Precautions / Restrictions Precautions Precautions: Fall Precaution Comments: incontinence Restrictions Weight Bearing Restrictions: No    Mobility  Bed Mobility Overal bed mobility: Needs Assistance Bed Mobility: Supine to Sit;Sit to Supine     Supine to sit: Min assist;HOB elevated Sit to supine: Min assist;HOB elevated   General bed mobility comments: Pt partially to EOB with bed alarm sounding. Pt requesting to get to bathroom. Assist to get to EOB and for LEs back onto bed.   Transfers Overall transfer level: Needs assistance Equipment used: 1 person hand held assist Transfers: Sit to/from Stand Sit to Stand: Min assist Stand pivot transfers: Min assist       General transfer comment: Assist to rise stabilize, maneuver safely, control descent. Pt used 1 HHA from therapist and bedrail/bsc armrest during pivot. Stand pivot x 2, bed<>bsc.   Ambulation/Gait                 Stairs            Wheelchair Mobility    Modified Rankin (Stroke Patients Only)       Balance Overall balance assessment: Needs assistance         Standing balance support: Bilateral upper extremity supported Standing balance-Leahy  Scale: Poor Standing balance comment: requires UE support                            Cognition Arousal/Alertness: Awake/alert Behavior During Therapy: Restless Overall Cognitive Status: No family/caregiver present to determine baseline cognitive functioning Area of Impairment: Memory                               General Comments: pt sitting EOB on arrival, states she doesn't know why she is here or what's going on, reorientated to place, situation, and date; more confused then previous session      Exercises      General Comments        Pertinent Vitals/Pain Pain Assessment: Faces Faces Pain Scale: Hurts little more Pain Location: legs Pain Descriptors / Indicators: Discomfort Pain Intervention(s): Monitored during session;Repositioned    Home Living                      Prior Function            PT Goals (current goals can now be found in the care plan section) Progress towards PT goals: Progressing toward goals    Frequency    Min 3X/week      PT Plan Current plan remains appropriate    Co-evaluation  AM-PAC PT "6 Clicks" Daily Activity  Outcome Measure  Difficulty turning over in bed (including adjusting bedclothes, sheets and blankets)?: A Little Difficulty moving from lying on back to sitting on the side of the bed? : Unable Difficulty sitting down on and standing up from a chair with arms (e.g., wheelchair, bedside commode, etc,.)?: Unable Help needed moving to and from a bed to chair (including a wheelchair)?: A Little Help needed walking in hospital room?: A Lot Help needed climbing 3-5 steps with a railing? : A Lot 6 Click Score: 12    End of Session   Activity Tolerance: Patient limited by fatigue Patient left: in bed;with call bell/phone within reach;with bed alarm set Nurse Communication: Mobility status PT Visit Diagnosis: Muscle weakness (generalized) (M62.81);Other abnormalities of gait  and mobility (R26.89)     Time: 5456-2563 PT Time Calculation (min) (ACUTE ONLY): 11 min  Charges:  $Therapeutic Activity: 8-22 mins                    G Codes:          Weston Anna, MPT Pager: [phone]

## 2017-12-12 NOTE — Progress Notes (Addendum)
Pt SOB with increased work of breathing upon assessment. Audible crackles bilaterally. o2 stats stable on room air, BP 128/57. Lamar Blinks NP paged. 40mg  of IV Lasix, chest x-ray and breathing treatment ordered. Will continue to monitor.   12/12/17 6415: Work of breathing decreased, diuresed 650mL, o2 stats stable, BP 114/67. Pt resting comfortably. Will continue to monitor.

## 2017-12-12 NOTE — Progress Notes (Signed)
PROGRESS NOTE  KAYTON RIPP  JKD:[id number] DOB: October 27, 1933 DOA: 12/05/2017 PCP: [name], MD  Brief Narrative:  Alexandria Reiger Brownis a 83 y.o.femalewith medical history significant ofCAD, diastolic CHF, pulmonary HTN, NSTEMI, HTN and atrial fibrillationon coumadin;reports having acute onset of chillsand lower back pain. She was admitted for sepsis from cellulitis of the left lower extremity.   She was started on broad-spectrum antibiotics with vancomycin and cefepime.  The abscess on her medial right thigh was drained in the ER.  As her blood cultures showed Streptococcus we have narrowed her antibiotic to Rocephin.  Wound care was consulted for bilateral lower extremity ulcers.    Assessment & Plan:  Sepsis secondary to streptococcal bacteremia/cellulitis of bilateral lower extremity.  Blood pressures are improving/hypotension resolving.    -  Continue ceftriaxone.  Plan to treat for 14 days.  Transition to augmentin at discharge -  Right thigh:  US demonstrates no residual abscess.  Drained by ER MD on 12/6.  -  TTE demonstrated no evidence of endocarditis -  Repeat blood culture NGTD -  cortisol level wnl  Possible enteritis with ileus seen on CT abdomen pelvis   She denies any nausea vomiting or abdominal pain at this time, but having frequent stools  Chronic atrial fibrillation, currently rate controlled -  Holding metoprolol due to recent hypotension -  Continue Coumadin, INR therapeutic -  D/c telemetry  Lumbar fractures, age indeterminate, likely secondary to the fall few weeks ago.   - PT recommending SNF.  D/[facility] to [facility] when medically stable  Acute respiratory failure with hypoxia secondary to acute on chronic diastolic heart failure caused by hypotension, holding diuretics -Resume metolazone -Increase Lasix to 40 mg IV twice daily -Carefully monitor blood pressure  Elevated troponins, probably from demand ischemia from sepsis. On  admission, physician on call spoke with Dr. Percival Spanish cardiology and recommended no ischemic workup at this time.  Patient denies any chest pain shortness of breath or presyncope.  Type 2 diabetes mellitus, last A1c is 5.5, holding metformin.  not requiring much insulin and at risk for hypoglycemia -  D/c SSI  Chronic venous stasis ulcerations Wound care consulted and recommendations given  Possible hepatocellular carcinoma No workup as per the family as per the daughter at bedside.  Sick euthyroid, repeat TFTs in 4 weeks.   DVT prophylaxis:  coumadin Code Status:  DNR Family Communication:  Patient.  Spoke with daughter by phone on 12/13.  Update [name] [phone].   Disposition Plan:  To SNF in a few days.  Needs additional diuresis and monitoring for hypotension.     Consultants:  SLP Wound care Phone consult with cardiology.   Procedures:  I&D right thigh abscess by ER MD on 12/6  Antimicrobials:  Anti-infectives (From admission, onward)   Start     Dose/Rate Route Frequency Ordered Stop   12/07/17 1200  vancomycin (VANCOCIN) IVPB 1000 mg/200 mL premix  Status:  Discontinued     1,000 mg 200 mL/hr over 60 Minutes Intravenous Every 36 hours 12/06/17 0536 12/06/17 1513   12/06/17 2000  cefTRIAXone (ROCEPHIN) 2 g in dextrose 5 % 50 mL IVPB     2 g 100 mL/hr over 30 Minutes Intravenous Every 24 hours 12/06/17 1513     12/06/17 0600  piperacillin-tazobactam (ZOSYN) IVPB 3.375 g  Status:  Discontinued     3.375 g 12.5 mL/hr over 240 Minutes Intravenous Every 8 hours 12/06/17 0534 12/06/17 1513   12/05/17 2100  vancomycin (VANCOCIN) 1,500 mg in sodium chloride 0.9 % 500 mL IVPB     1,500 mg 250 mL/hr over 120 Minutes Intravenous  Once 12/05/17 2055 12/06/17 0026   12/05/17 2100  piperacillin-tazobactam (ZOSYN) IVPB 3.375 g     3.375 g 100 mL/hr over 30 Minutes Intravenous  Once 12/05/17 2055 12/05/17 2145       Subjective: Still feels confused.  Denies any  problems breathing but overnight she had some dyspnea and required an extra dose of Lasix.  Her chest x-ray shows worsening vascular congestion.  She denies pains, nausea.  Objective: Vitals:   12/12/17 0012 12/12/17 0149 12/12/17 0150 12/12/17 0300  BP: 122/72 114/67  118/62  Pulse:    99  Resp:    18  Temp:    98 F (36.7 C)  TempSrc:    Oral  SpO2: 92% (!) 85% 97% 98%  Weight:    83.3 kg (183 lb 10.3 oz)  Height:        Intake/Output Summary (Last 24 hours) at 12/12/2017 1331 Last data filed at 12/12/2017 1308 Gross per 24 hour  Intake 370 ml  Output 1250 ml  Net -880 ml   Filed Weights   12/10/17 0400 12/11/17 0432 12/12/17 0300  Weight: 84 kg (185 lb 3 oz) 83.3 kg (183 lb 10.3 oz) 83.3 kg (183 lb 10.3 oz)    Examination:  General exam:  Adult female, pleasantly confused, tachypneic, intermittent SCM retractions.  Mild respiratory distress  HEENT:  NCAT, MMM Respiratory system: Wheezing throughout, rales to the apices bilaterally, positive rhonchi Cardiovascular system: Irregular rate and rhythm, normal S1/S2.  3 out of 6 systolic murmur  Warm extremities Gastrointestinal system: Normal active bowel sounds, soft, nondistended, nontender. MSK:  Normal tone and bulk.  Left lower extremity: 3 cm ulcerated area with erythema that is receding.  It extends to below the knee on the lateral aspect of the left leg and it appears less hot pink compared to yesterday.  The right thigh area is still about 2 cm in diameter but the erythema continues to improve.  The induration in the area of fluctuance appear to be improved since yesterday.  Swelling has decreased to 2+ edema bilaterally Psych: Alert and oriented to person only   Data Reviewed: I have personally reviewed following labs and imaging studies  CBC: Recent Labs  Lab 12/05/17 1845 12/06/17 0135 12/07/17 0841 12/09/17 0328 12/11/17 0457 12/12/17 0519  WBC 21.7* 31.3* 21.3* 11.5* 12.3* 13.4*  NEUTROABS 20.4* 29.1*  19.3*  --   --   --   HGB 12.1 10.5* 10.3* 10.8* 11.4* 11.2*  HCT 37.2 33.0* 33.0* 34.0* 36.3 35.8*  MCV 82.5 82.7 83.8 82.1 82.9 82.7  PLT 349 265 236 267 299 196   Basic Metabolic Panel: Recent Labs  Lab 12/06/17 0135 12/07/17 0841 12/09/17 1201 12/10/17 0920 12/11/17 0457 12/12/17 0519  NA 134* 132* 138  --  134* 137  K 4.3 3.8 3.0* 3.7 3.3* 3.3*  CL 99* 102 105  --  101 100*  CO2 23 20* 23  --  22 26  GLUCOSE 107* 115* 96  --  101* 104*  BUN 56* 58* 44*  --  39* 37*  CREATININE 1.63* 1.70* 1.15*  --  1.01* 0.99  CALCIUM 7.9* 8.3* 8.7*  --  8.4* 8.5*   GFR: Estimated Creatinine Clearance: 51.5 mL/min (by C-G formula based on SCr of 0.99 mg/dL). Liver Function Tests: Recent Labs  Lab 12/06/17 0135  AST 55*  ALT 19  ALKPHOS 100  BILITOT 1.7*  PROT 6.0*  ALBUMIN 2.7*   No results for input(s): LIPASE, AMYLASE in the last 168 hours. No results for input(s): AMMONIA in the last 168 hours. Coagulation Profile: Recent Labs  Lab 12/08/17 0255 12/09/17 0328 12/10/17 0353 12/11/17 0457 12/12/17 0519  INR 3.50 3.47 3.71 3.10 2.35   Cardiac Enzymes: Recent Labs  Lab 12/06/17 0135  TROPONINI 1.89*   BNP (last 3 results) No results for input(s): PROBNP in the last 8760 hours. HbA1C: No results for input(s): HGBA1C in the last 72 hours. CBG: Recent Labs  Lab 12/11/17 1153 12/11/17 1641 12/11/17 2129 12/12/17 0752 12/12/17 1215  GLUCAP 109* 113* 138* 84 93   Lipid Profile: No results for input(s): CHOL, HDL, LDLCALC, TRIG, CHOLHDL, LDLDIRECT in the last 72 hours. Thyroid Function Tests: No results for input(s): TSH, T4TOTAL, FREET4, T3FREE, THYROIDAB in the last 72 hours. Anemia Panel: No results for input(s): VITAMINB12, FOLATE, FERRITIN, TIBC, IRON, RETICCTPCT in the last 72 hours. Urine analysis:    Component Value Date/Time   COLORURINE YELLOW 12/05/2017 2028   APPEARANCEUR CLEAR 12/05/2017 2028   LABSPEC 1.015 12/05/2017 2028   PHURINE 5.0  12/05/2017 2028   GLUCOSEU NEGATIVE 12/05/2017 2028   HGBUR NEGATIVE 12/05/2017 2028   BILIRUBINUR NEGATIVE 12/05/2017 2028   KETONESUR NEGATIVE 12/05/2017 2028   PROTEINUR 30 (A) 12/05/2017 2028   UROBILINOGEN 0.2 02/03/2013 1217   NITRITE NEGATIVE 12/05/2017 2028   LEUKOCYTESUR NEGATIVE 12/05/2017 2028   Sepsis Labs: @LABRCNTIP (procalcitonin:4,lacticidven:4)  ) Recent Results (from the past 240 hour(s))  Blood culture (routine x 2)     Status: Abnormal   Collection Time: 12/05/17  8:56 PM  Result Value Ref Range Status   Specimen Description BLOOD LEFT ANTECUBITAL  Final   Special Requests   Final    BOTTLES DRAWN AEROBIC AND ANAEROBIC Blood Culture adequate volume   Culture  Setup Time   Final    GRAM POSITIVE COCCI IN CHAINS ANAEROBIC BOTTLE ONLY CRITICAL VALUE NOTED.  VALUE IS CONSISTENT WITH PREVIOUSLY REPORTED AND CALLED VALUE.    Culture (A)  Final    STREPTOCOCCUS DYSGALACTIAE SUSCEPTIBILITIES PERFORMED ON PREVIOUS CULTURE WITHIN THE LAST 5 DAYS. Performed at [facility], [place] [address], [place], [place] [address]    Report Status 12/08/2017 FINAL  Final  Blood culture (routine x 2)     Status: Abnormal   Collection Time: 12/05/17  9:01 PM  Result Value Ref Range Status   Specimen Description BLOOD BLOOD RIGHT FOREARM  Final   Special Requests   Final    BOTTLES DRAWN AEROBIC AND ANAEROBIC Blood Culture adequate volume   Culture  Setup Time   Final    GRAM POSITIVE COCCI IN CHAINS IN BOTH AEROBIC AND ANAEROBIC BOTTLES CRITICAL RESULT CALLED TO, READ BACK BY AND VERIFIED WITH: EBurman Foster.D. 14:30 12/06/17 ([email or web address]) Performed at [facility], [place] [address], [place], [place] [address]    Culture STREPTOCOCCUS DYSGALACTIAE (A)  Final   Report Status 12/08/2017 FINAL  Final   Organism ID, Bacteria STREPTOCOCCUS DYSGALACTIAE  Final      Susceptibility   Streptococcus dysgalactiae - MIC*    CLINDAMYCIN >=1 RESISTANT Resistant     AMPICILLIN  <=0.25 SENSITIVE Sensitive     ERYTHROMYCIN >=8 RESISTANT Resistant     VANCOMYCIN 0.25 SENSITIVE Sensitive     CEFTRIAXONE <=0.12 SENSITIVE Sensitive     LEVOFLOXACIN 0.5 SENSITIVE Sensitive     *  STREPTOCOCCUS DYSGALACTIAE  Blood Culture ID Panel (Reflexed)     Status: Abnormal   Collection Time: 12/05/17  9:01 PM  Result Value Ref Range Status   Enterococcus species NOT DETECTED NOT DETECTED Final   Listeria monocytogenes NOT DETECTED NOT DETECTED Final   Staphylococcus species NOT DETECTED NOT DETECTED Final   Staphylococcus aureus NOT DETECTED NOT DETECTED Final   Streptococcus species DETECTED (A) NOT DETECTED Final    Comment: Not Enterococcus species, Streptococcus agalactiae, Streptococcus pyogenes, or Streptococcus pneumoniae. CRITICAL RESULT CALLED TO, READ BACK BY AND VERIFIED WITH: EBurman Foster.D. 14:30 12/06/17 ([email or web address])    Streptococcus agalactiae NOT DETECTED NOT DETECTED Final   Streptococcus pneumoniae NOT DETECTED NOT DETECTED Final   Streptococcus pyogenes NOT DETECTED NOT DETECTED Final   Acinetobacter baumannii NOT DETECTED NOT DETECTED Final   Enterobacteriaceae species NOT DETECTED NOT DETECTED Final   Enterobacter cloacae complex NOT DETECTED NOT DETECTED Final   Escherichia coli NOT DETECTED NOT DETECTED Final   Klebsiella oxytoca NOT DETECTED NOT DETECTED Final   Klebsiella pneumoniae NOT DETECTED NOT DETECTED Final   Proteus species NOT DETECTED NOT DETECTED Final   Serratia marcescens NOT DETECTED NOT DETECTED Final   Haemophilus influenzae NOT DETECTED NOT DETECTED Final   Neisseria meningitidis NOT DETECTED NOT DETECTED Final   Pseudomonas aeruginosa NOT DETECTED NOT DETECTED Final   Candida albicans NOT DETECTED NOT DETECTED Final   Candida glabrata NOT DETECTED NOT DETECTED Final   Candida krusei NOT DETECTED NOT DETECTED Final   Candida parapsilosis NOT DETECTED NOT DETECTED Final   Candida tropicalis NOT DETECTED NOT DETECTED Final     Comment: Performed at [facility], [place] [address], [place], [place] [address]  MRSA PCR Screening     Status: Abnormal   Collection Time: 12/06/17  1:20 AM  Result Value Ref Range Status   MRSA by PCR POSITIVE (A) NEGATIVE Final    Comment:        The GeneXpert MRSA Assay (FDA approved for NASAL specimens only), is one component of a comprehensive MRSA colonization surveillance program. It is not intended to diagnose MRSA infection nor to guide or monitor treatment for MRSA infections. RESULT CALLED TO, READ BACK BY AND VERIFIED WITH: SCOTT,B RN 12.7.18 @0515  ZANDO,C       Radiology Studies: Dg Chest 2 View  Result Date: 12/10/2017 CLINICAL DATA:  Followup.  Cough EXAM: CHEST  2 VIEW COMPARISON:  12/05/2017 FINDINGS: Small left more than right pleural effusions that are layering. Low volume chest with vascular congestion. Chronic cardiomegaly. Status post CABG and coronary stenting. No air bronchogram. No pneumothorax. IMPRESSION: 1. Increased but still small pleural effusions. 2. Cardiomegaly and vascular congestion. Electronically Signed   By: Monte Fantasia M.D.   On: 12/10/2017 16:14   Dg Chest Port 1 View  Result Date: 12/11/2017 CLINICAL DATA:  Acute onset of shortness of breath and wheezing. Lung crackles. EXAM: PORTABLE CHEST 1 VIEW COMPARISON:  Chest radiograph performed 12/10/2017 FINDINGS: The lungs are well-aerated. Small bilateral pleural effusions are noted. Vascular congestion is noted. Increased interstitial markings may reflect mild interstitial edema. There is no evidence of pneumothorax. The cardiomediastinal silhouette is mildly enlarged. The patient is status post median sternotomy, with evidence of prior CABG. No acute osseous abnormalities are seen. IMPRESSION: Small bilateral pleural effusions. Vascular congestion and mild cardiomegaly. Increased interstitial markings raise concern for mild interstitial edema. This is somewhat more apparent than on the  prior study. Electronically Signed   By: Jacqulynn Cadet  Chang M.D.   On: 12/11/2017 21:27   [place] Lt Lower Extrem Ltd Soft Tissue Non Vascular  Result Date: 12/12/2017 CLINICAL DATA:  Wound in the right mid medial thigh EXAM: ULTRASOUND right LOWER EXTREMITY LIMITED TECHNIQUE: Ultrasound examination of the lower extremity soft tissues was performed in the area of clinical concern. COMPARISON:  None FINDINGS: Ultrasound over the area in question was performed. No discrete abscess is seen. However there is fluid interspersed among soft tissues of the mid medial right thigh consistent with edema/cellulitis. IMPRESSION: No discrete abscess, with some edema/ cellulitis present in the soft tissues. Electronically Signed   By: Ivar Drape M.D.   On: 12/12/2017 12:02     Scheduled Meds: . collagenase   Topical Daily  . furosemide  40 mg Intravenous BID WC  . insulin aspart  0-9 Units Subcutaneous TID WC  . pantoprazole (PROTONIX) IV  40 mg Intravenous Q12H  . potassium chloride  40 mEq Oral Once  . warfarin  2.5 mg Oral ONCE-1800  . Warfarin - Pharmacist Dosing Inpatient   Does not apply q1800   Continuous Infusions: . cefTRIAXone (ROCEPHIN)  IV Stopped (12/11/17 2207)     LOS: 7 days    Time spent: 30 min    Janece Canterbury, MD [facility] Pager [phone]  If 7PM-7AM, please contact night-coverage [email or web address] Password [email or web address] 12/12/2017, 1:31 PM

## 2017-12-12 NOTE — Progress Notes (Signed)
Unable to collect UA or measure patient's urine due to patient having stool and urine together in hat.  New hat placed, will continue to monitor and attempt to collect UA next time patient voids.

## 2017-12-13 LAB — CBC
HEMATOCRIT: 36.9 % (ref 36.0–46.0)
Hemoglobin: 11.5 g/dL — ABNORMAL LOW (ref 12.0–15.0)
MCH: 25.7 pg — AB (ref 26.0–34.0)
MCHC: 31.2 g/dL (ref 30.0–36.0)
MCV: 82.6 fL (ref 78.0–100.0)
PLATELETS: 307 10*3/uL (ref 150–400)
RBC: 4.47 MIL/uL (ref 3.87–5.11)
RDW: 18.2 % — AB (ref 11.5–15.5)
WBC: 12.3 10*3/uL — ABNORMAL HIGH (ref 4.0–10.5)

## 2017-12-13 LAB — BASIC METABOLIC PANEL
Anion gap: 12 (ref 5–15)
BUN: 32 mg/dL — AB (ref 6–20)
CHLORIDE: 98 mmol/L — AB (ref 101–111)
CO2: 24 mmol/L (ref 22–32)
CREATININE: 1.07 mg/dL — AB (ref 0.44–1.00)
Calcium: 8.3 mg/dL — ABNORMAL LOW (ref 8.9–10.3)
GFR calc Af Amer: 54 mL/min — ABNORMAL LOW (ref >60)
GFR calc non Af Amer: 47 mL/min — ABNORMAL LOW (ref >60)
GLUCOSE: 99 mg/dL (ref 65–99)
POTASSIUM: 3.2 mmol/L — AB (ref 3.5–5.1)
Sodium: 134 mmol/L — ABNORMAL LOW (ref 135–145)

## 2017-12-13 LAB — PROTIME-INR
INR: 2.34
Prothrombin Time: 25.4 seconds — ABNORMAL HIGH (ref 11.4–15.2)

## 2017-12-13 LAB — MAGNESIUM: Magnesium: 1.1 mg/dL — ABNORMAL LOW (ref 1.7–2.4)

## 2017-12-13 MED ORDER — MAGNESIUM SULFATE 4 GM/100ML IV SOLN
4.0000 g | Freq: Once | INTRAVENOUS | Status: AC
  Administered 2017-12-13: 4 g via INTRAVENOUS
  Filled 2017-12-13: qty 100

## 2017-12-13 MED ORDER — METOLAZONE 2.5 MG PO TABS
2.5000 mg | ORAL_TABLET | ORAL | Status: DC
  Administered 2017-12-14: 2.5 mg via ORAL
  Filled 2017-12-13: qty 1

## 2017-12-13 MED ORDER — POTASSIUM CHLORIDE CRYS ER 20 MEQ PO TBCR
40.0000 meq | EXTENDED_RELEASE_TABLET | ORAL | Status: AC
  Administered 2017-12-13: 40 meq via ORAL
  Filled 2017-12-13: qty 2

## 2017-12-13 MED ORDER — WARFARIN SODIUM 2.5 MG PO TABS
2.5000 mg | ORAL_TABLET | Freq: Once | ORAL | Status: AC
  Administered 2017-12-13: 2.5 mg via ORAL
  Filled 2017-12-13: qty 1

## 2017-12-13 NOTE — Plan of Care (Signed)
  Clinical Measurements: Will remain free from infection 12/13/2017 0318 - Progressing by Bertrum Sol, RN

## 2017-12-13 NOTE — Care Management Note (Signed)
Case Management Note  Patient Details  Name: [name] MRN: [id number] Date of Birth: 24-Jan-1933  Subjective/Objective: PT recc SNF. From [facility]-CSW notified.May d/c in am.                   Action/Plan:d/c plan SNF.   Expected Discharge Date:                  Expected Discharge Plan:  Assisted Living / Rest Home  In-House Referral:  Clinical Social Work  Discharge planning Services  CM Consult  Post Acute Care Choice:    Choice offered to:     DME Arranged:    DME Agency:     HH Arranged:    HH Agency:     Status of Service:  In process, will continue to follow  If discussed at Long Length of Stay Meetings, dates discussed:    Additional Comments:  Dessa Phi, RN 12/13/2017, 12:56 PM

## 2017-12-13 NOTE — Progress Notes (Signed)
ANTICOAGULATION CONSULT NOTE - Follow Up Consult  Pharmacy Consult for Warfarin Indication: atrial fibrillation  Allergies  Allergen Reactions  . Ace Inhibitors Other (See Comments)    Intolerance per Dr. Doug Sou note  . Amlodipine Other (See Comments)    Intolerance per Dr. Doug Sou note   . Statins Other (See Comments)    Muscle soreness  . Sulfa Drugs Cross Reactors Itching  . Zetia [Ezetimibe] Other (See Comments)    Muscle soreness  . Fenofibrate Other (See Comments)    Muscle soreness    Patient Measurements: Height: 5\' 10"  (177.8 cm) Weight: 176 lb 12.9 oz (80.2 kg) IBW/kg (Calculated) : 68.5  Vital Signs: Temp: 98 F (36.7 C) (12/14 0300) Temp Source: Oral (12/14 0300) BP: 122/58 (12/14 0300) Pulse Rate: 94 (12/14 0300)  Labs: Recent Labs    12/11/17 0457 12/12/17 0519 12/13/17 0514  HGB 11.4* 11.2* 11.5*  HCT 36.3 35.8* 36.9  PLT 299 324 307  LABPROT 31.7* 25.6* 25.4*  INR 3.10 2.35 2.34  CREATININE 1.01* 0.99 1.07*    Estimated Creatinine Clearance: 43.8 mL/min (A) (by C-G formula based on SCr of 1.07 mg/dL (H)).   Medications:  Scheduled:  . collagenase   Topical Daily  . feeding supplement (ENSURE ENLIVE)  237 mL Oral BID BM  . furosemide  40 mg Intravenous BID WC  . multivitamin with minerals  1 tablet Oral Daily  . pantoprazole (PROTONIX) IV  40 mg Intravenous Q12H  . potassium chloride  40 mEq Oral Q4H  . Warfarin - Pharmacist Dosing Inpatient   Does not apply q1800   Infusions:  . cefTRIAXone (ROCEPHIN)  IV Stopped (12/12/17 2016)    Assessment: 9 yoF presented from [place] on 12/6 with chills and back pain.  PMH includes chronic anticoagulation on warfarin for afib.    PTA warfarin dose warfarin per NH note is 2.5mg  po daily, except 5mg  on Monday, Wednesday, Friday.  But per Med history is listed as 3.5 mg (2.5mg  tablet + 1 mg tablet) on Sunday, Tuesday, Thursday, Saturday. Admission INR is therapeutic at 2.22  Clarified  warfarin dosing: 3.5mg  daily except 5mg  Mon/Wed/Fri  Today, 12/13/2017: -  INR within therapeutic range at 2.34 (with dose held 12/9-12/11 and restarted back at 2 mg on 12/12) -  CBC: stable -  No bleeding documented -  Diet: Regular but no intake recorded -  No major drug-drug interactions identified. Atibiotics (ceftriaxone) can make patient more sensitive to warfarin  Goal of Therapy:  INR 2-3   Plan:  - Continue warfarin 2.5 mg PO x1 today  - Daily PT/INR. - Monitor for signs and symptoms of bleeding.  [name], PharmD, BCPS 12/13/2017 9:10 AM

## 2017-12-13 NOTE — Progress Notes (Signed)
PROGRESS NOTE  [name]  UVO:[id number] DOB: 1933/10/08 DOA: 12/05/2017 PCP: Alexandria Olp, MD  Brief Narrative:  [name] a 83 y.o.femalewith medical history significant ofCAD, diastolic CHF, pulmonary HTN, NSTEMI, HTN and atrial fibrillationon coumadin;reports having acute onset of chillsand lower back pain. She was admitted for sepsis from cellulitis of the left lower extremity.   She was started on broad-spectrum antibiotics with vancomycin and cefepime.  The abscess on her medial right thigh was drained in the ER.  As her blood cultures showed Streptococcus we have narrowed her antibiotic to Rocephin.  Wound care was consulted for bilateral lower extremity ulcers.     Assessment & Plan:  Sepsis secondary to streptococcal bacteremia/cellulitis of bilateral lower extremity.  Blood pressures are improving/hypotension resolving.    -  Continue ceftriaxone.  Plan to treat for 14 days.  Transition to augmentin at discharge -  Right thigh:  US demonstrates no residual abscess.  Drained by ER MD on 12/6.  -  TTE demonstrated no evidence of endocarditis -  Repeat blood culture NGTD -  cortisol level wnl  Possible enteritis with ileus seen on CT abdomen pelvis - eating well, no further diarrhea    Chronic atrial fibrillation, currently rate controlled -  Holding metoprolol due to recent hypotension -  Continue Coumadin, INR therapeutic  Lumbar fractures, age indeterminate, likely secondary to the fall few weeks ago.   - PT recommending SNF.  D/c to [facility] when medically stable  Acute respiratory failure with hypoxia secondary to acute on chronic diastolic heart failure caused by hypotension which necessitated holding diuretics -Resume metolazone every 3 days -Continue Lasix 40 mg IV twice daily -Carefully monitor blood pressure  Elevated troponins, probably from demand ischemia from sepsis. On admission, physician on call spoke with Dr.  Percival Spanish cardiology and recommended no ischemic workup at this time.  Patient denies any chest pain shortness of breath or presyncope.  Type 2 diabetes mellitus, last A1c is 5.5, holding metformin.  not requiring much insulin and at risk for hypoglycemia -  D/c SSI  Chronic venous stasis ulcerations Wound care consulted and recommendations given  Possible hepatocellular carcinoma No workup as per the family as per the daughter at bedside.  Sick euthyroid, repeat TFTs in 4 weeks.  Hypokalemia and hypomagnesemia secondary to diuretics, given oral potassium repletion and IV magnesium repletion  DVT prophylaxis:  coumadin Code Status:  DNR Family Communication:  Patient.  Spoke with daughter by phone on 12/14.  Update [name] [phone].   Disposition Plan:  To SNF likely tomorrow   Consultants:  SLP Wound care Phone consult with cardiology.   Procedures:  I&D right thigh abscess by ER MD on 12/6  Antimicrobials:  Anti-infectives (From admission, onward)   Start     Dose/Rate Route Frequency Ordered Stop   12/07/17 1200  vancomycin (VANCOCIN) IVPB 1000 mg/200 mL premix  Status:  Discontinued     1,000 mg 200 mL/hr over 60 Minutes Intravenous Every 36 hours 12/06/17 0536 12/06/17 1513   12/06/17 2000  cefTRIAXone (ROCEPHIN) 2 g in dextrose 5 % 50 mL IVPB     2 g 100 mL/hr over 30 Minutes Intravenous Every 24 hours 12/06/17 1513     12/06/17 0600  piperacillin-tazobactam (ZOSYN) IVPB 3.375 g  Status:  Discontinued     3.375 g 12.5 mL/hr over 240 Minutes Intravenous Every 8 hours 12/06/17 0534 12/06/17 1513   12/05/17 2100  vancomycin (VANCOCIN) 1,500 mg in  sodium chloride 0.9 % 500 mL IVPB     1,500 mg 250 mL/hr over 120 Minutes Intravenous  Once 12/05/17 2055 12/06/17 0026   12/05/17 2100  piperacillin-tazobactam (ZOSYN) IVPB 3.375 g     3.375 g 100 mL/hr over 30 Minutes Intravenous  Once 12/05/17 2055 12/05/17 2145       Subjective:  Confused.  Feels that  she is still little bit Zaylei Mullane of breath.  Denies chest pains, nausea, abdominal pains.  Objective: Vitals:   12/12/17 0300 12/12/17 1349 12/13/17 0024 12/13/17 0300  BP: 118/62 105/68 111/60 (!) 122/58  Pulse: 99 88 86 94  Resp: 18 18 17 19   Temp: 98 F (36.7 C) 99.2 F (37.3 C) 98.5 F (36.9 C) 98 F (36.7 C)  TempSrc: Oral   Oral  SpO2: 98% 93% 95% 95%  Weight: 83.3 kg (183 lb 10.3 oz)   80.2 kg (176 lb 12.9 oz)  Height:        Intake/Output Summary (Last 24 hours) at 12/13/2017 1440 Last data filed at 12/13/2017 1010 Gross per 24 hour  Intake 240 ml  Output 650 ml  Net -410 ml   Filed Weights   12/11/17 0432 12/12/17 0300 12/13/17 0300  Weight: 83.3 kg (183 lb 10.3 oz) 83.3 kg (183 lb 10.3 oz) 80.2 kg (176 lb 12.9 oz)    Examination:  General exam:  Adult female, no acute distress. HEENT:  NCAT, dry tongue Respiratory system: Wheezing has resolved, persistent rales at the bilateral bases but otherwise markedly improved.  No rhonchi  cardiovascular system: Irregular rate and rhythm, normal S1/S2.  3 out of 6 systolic murmur.  Warm extremities Gastrointestinal system: Normal active bowel sounds, soft, nondistended, nontender. MSK:  Normal tone and bulk,  2+ edema of the left leg and lower leg is still somewhat red but not bright pink as it was before.  fmall amount of yellow-green discharge on dressing on right shin.  Left leg abscessed area is soft and even less indurated than yesterday.   Neuro:  Grossly intact Psych:  Alert and oriented to person.  Initially said the wrong location, but then corrected to [facility] hospital.  Not oriented to time  Data Reviewed: I have personally reviewed following labs and imaging studies  CBC: Recent Labs  Lab 12/07/17 0841 12/09/17 0328 12/11/17 0457 12/12/17 0519 12/13/17 0514  WBC 21.3* 11.5* 12.3* 13.4* 12.3*  NEUTROABS 19.3*  --   --   --   --   HGB 10.3* 10.8* 11.4* 11.2* 11.5*  HCT 33.0* 34.0* 36.3 35.8* 36.9   MCV 83.8 82.1 82.9 82.7 82.6  PLT 236 267 299 324 096   Basic Metabolic Panel: Recent Labs  Lab 12/07/17 0841 12/09/17 1201 12/10/17 0920 12/11/17 0457 12/12/17 0519 12/13/17 0514  NA 132* 138  --  134* 137 134*  K 3.8 3.0* 3.7 3.3* 3.3* 3.2*  CL 102 105  --  101 100* 98*  CO2 20* 23  --  22 26 24   GLUCOSE 115* 96  --  101* 104* 99  BUN 58* 44*  --  39* 37* 32*  CREATININE 1.70* 1.15*  --  1.01* 0.99 1.07*  CALCIUM 8.3* 8.7*  --  8.4* 8.5* 8.3*  MG  --   --   --   --   --  1.1*   GFR: Estimated Creatinine Clearance: 43.8 mL/min (A) (by C-G formula based on SCr of 1.07 mg/dL (H)). Liver Function Tests: No results for input(s):  AST, ALT, ALKPHOS, BILITOT, PROT, ALBUMIN in the last 168 hours. No results for input(s): LIPASE, AMYLASE in the last 168 hours. No results for input(s): AMMONIA in the last 168 hours. Coagulation Profile: Recent Labs  Lab 12/09/17 0328 12/10/17 0353 12/11/17 0457 12/12/17 0519 12/13/17 0514  INR 3.47 3.71 3.10 2.35 2.34   Cardiac Enzymes: No results for input(s): CKTOTAL, CKMB, CKMBINDEX, TROPONINI in the last 168 hours. BNP (last 3 results) No results for input(s): PROBNP in the last 8760 hours. HbA1C: No results for input(s): HGBA1C in the last 72 hours. CBG: Recent Labs  Lab 12/11/17 1641 12/11/17 2129 12/12/17 0752 12/12/17 1215 12/12/17 1719  GLUCAP 113* 138* 84 93 95   Lipid Profile: No results for input(s): CHOL, HDL, LDLCALC, TRIG, CHOLHDL, LDLDIRECT in the last 72 hours. Thyroid Function Tests: No results for input(s): TSH, T4TOTAL, FREET4, T3FREE, THYROIDAB in the last 72 hours. Anemia Panel: No results for input(s): VITAMINB12, FOLATE, FERRITIN, TIBC, IRON, RETICCTPCT in the last 72 hours. Urine analysis:    Component Value Date/Time   COLORURINE STRAW (A) 12/12/2017 1837   APPEARANCEUR CLEAR 12/12/2017 1837   LABSPEC 1.005 12/12/2017 1837   PHURINE 5.0 12/12/2017 1837   GLUCOSEU NEGATIVE 12/12/2017 1837   HGBUR  NEGATIVE 12/12/2017 1837   BILIRUBINUR NEGATIVE 12/12/2017 1837   KETONESUR NEGATIVE 12/12/2017 1837   PROTEINUR NEGATIVE 12/12/2017 1837   UROBILINOGEN 0.2 02/03/2013 1217   NITRITE NEGATIVE 12/12/2017 1837   LEUKOCYTESUR TRACE (A) 12/12/2017 1837   Sepsis Labs: @LABRCNTIP (procalcitonin:4,lacticidven:4)  ) Recent Results (from the past 240 hour(s))  Blood culture (routine x 2)     Status: Abnormal   Collection Time: 12/05/17  8:56 PM  Result Value Ref Range Status   Specimen Description BLOOD LEFT ANTECUBITAL  Final   Special Requests   Final    BOTTLES DRAWN AEROBIC AND ANAEROBIC Blood Culture adequate volume   Culture  Setup Time   Final    GRAM POSITIVE COCCI IN CHAINS ANAEROBIC BOTTLE ONLY CRITICAL VALUE NOTED.  VALUE IS CONSISTENT WITH PREVIOUSLY REPORTED AND CALLED VALUE.    Culture (A)  Final    STREPTOCOCCUS DYSGALACTIAE SUSCEPTIBILITIES PERFORMED ON PREVIOUS CULTURE WITHIN THE LAST 5 DAYS. Performed at [facility], [place] [address] [place], [place] [address]    Report Status 12/08/2017 FINAL  Final  Blood culture (routine x 2)     Status: Abnormal   Collection Time: 12/05/17  9:01 PM  Result Value Ref Range Status   Specimen Description BLOOD BLOOD RIGHT FOREARM  Final   Special Requests   Final    BOTTLES DRAWN AEROBIC AND ANAEROBIC Blood Culture adequate volume   Culture  Setup Time   Final    GRAM POSITIVE COCCI IN CHAINS IN BOTH AEROBIC AND ANAEROBIC BOTTLES CRITICAL RESULT CALLED TO, READ BACK BY AND VERIFIED WITH: EBurman Foster.D. 14:30 12/06/17 ([email or web address]) Performed at [facility], [place] [address] [place], [place] [address]    Culture STREPTOCOCCUS DYSGALACTIAE (A)  Final   Report Status 12/08/2017 FINAL  Final   Organism ID, Bacteria STREPTOCOCCUS DYSGALACTIAE  Final      Susceptibility   Streptococcus dysgalactiae - MIC*    CLINDAMYCIN >=1 RESISTANT Resistant     AMPICILLIN <=0.25 SENSITIVE Sensitive     ERYTHROMYCIN >=8 RESISTANT  Resistant     VANCOMYCIN 0.25 SENSITIVE Sensitive     CEFTRIAXONE <=0.12 SENSITIVE Sensitive     LEVOFLOXACIN 0.5 SENSITIVE Sensitive     * STREPTOCOCCUS DYSGALACTIAE  Blood Culture ID Panel (Reflexed)     Status: Abnormal   Collection Time: 12/05/17  9:01 PM  Result Value Ref Range Status   Enterococcus species NOT DETECTED NOT DETECTED Final   Listeria monocytogenes NOT DETECTED NOT DETECTED Final   Staphylococcus species NOT DETECTED NOT DETECTED Final   Staphylococcus aureus NOT DETECTED NOT DETECTED Final   Streptococcus species DETECTED (A) NOT DETECTED Final    Comment: Not Enterococcus species, Streptococcus agalactiae, Streptococcus pyogenes, or Streptococcus pneumoniae. CRITICAL RESULT CALLED TO, READ BACK BY AND VERIFIED WITH: EBurman Foster.D. 14:30 12/06/17 ([email or web address])    Streptococcus agalactiae NOT DETECTED NOT DETECTED Final   Streptococcus pneumoniae NOT DETECTED NOT DETECTED Final   Streptococcus pyogenes NOT DETECTED NOT DETECTED Final   Acinetobacter baumannii NOT DETECTED NOT DETECTED Final   Enterobacteriaceae species NOT DETECTED NOT DETECTED Final   Enterobacter cloacae complex NOT DETECTED NOT DETECTED Final   Escherichia coli NOT DETECTED NOT DETECTED Final   Klebsiella oxytoca NOT DETECTED NOT DETECTED Final   Klebsiella pneumoniae NOT DETECTED NOT DETECTED Final   Proteus species NOT DETECTED NOT DETECTED Final   Serratia marcescens NOT DETECTED NOT DETECTED Final   Haemophilus influenzae NOT DETECTED NOT DETECTED Final   Neisseria meningitidis NOT DETECTED NOT DETECTED Final   Pseudomonas aeruginosa NOT DETECTED NOT DETECTED Final   Candida albicans NOT DETECTED NOT DETECTED Final   Candida glabrata NOT DETECTED NOT DETECTED Final   Candida krusei NOT DETECTED NOT DETECTED Final   Candida parapsilosis NOT DETECTED NOT DETECTED Final   Candida tropicalis NOT DETECTED NOT DETECTED Final    Comment: Performed at [facility], [place] [address],  [place], [place] [address]  MRSA PCR Screening     Status: Abnormal   Collection Time: 12/06/17  1:20 AM  Result Value Ref Range Status   MRSA by PCR POSITIVE (A) NEGATIVE Final    Comment:        The GeneXpert MRSA Assay (FDA approved for NASAL specimens only), is one component of a comprehensive MRSA colonization surveillance program. It is not intended to diagnose MRSA infection nor to guide or monitor treatment for MRSA infections. RESULT CALLED TO, READ BACK BY AND VERIFIED WITH: SCOTT,B RN 12.7.18 @0515  ZANDO,C   Culture, blood (single) w Reflex to ID Panel     Status: None (Preliminary result)   Collection Time: 12/11/17  8:07 AM  Result Value Ref Range Status   Specimen Description BLOOD LEFT ANTECUBITAL  Final   Special Requests   Final    BOTTLES DRAWN AEROBIC AND ANAEROBIC Blood Culture adequate volume   Culture   Final    NO GROWTH 2 DAYS Performed at [facility], [address], [place], [place] [address]    Report Status PENDING  Incomplete      Radiology Studies: Dg Chest Port 1 View  Result Date: 12/11/2017 CLINICAL DATA:  Acute onset of shortness of breath and wheezing. Lung crackles. EXAM: PORTABLE CHEST 1 VIEW COMPARISON:  Chest radiograph performed 12/10/2017 FINDINGS: The lungs are well-aerated. Small bilateral pleural effusions are noted. Vascular congestion is noted. Increased interstitial markings may reflect mild interstitial edema. There is no evidence of pneumothorax. The cardiomediastinal silhouette is mildly enlarged. The patient is status post median sternotomy, with evidence of prior CABG. No acute osseous abnormalities are seen. IMPRESSION: Small bilateral pleural effusions. Vascular congestion and mild cardiomegaly. Increased interstitial markings raise concern for mild interstitial edema. This is somewhat more apparent than on the  prior study. Electronically Signed   By: Garald Balding M.D.   On: 12/11/2017 21:27   [place] Lt Lower Extrem Ltd Soft  Tissue Non Vascular  Result Date: 12/12/2017 CLINICAL DATA:  Wound in the right mid medial thigh EXAM: ULTRASOUND right LOWER EXTREMITY LIMITED TECHNIQUE: Ultrasound examination of the lower extremity soft tissues was performed in the area of clinical concern. COMPARISON:  None FINDINGS: Ultrasound over the area in question was performed. No discrete abscess is seen. However there is fluid interspersed among soft tissues of the mid medial right thigh consistent with edema/cellulitis. IMPRESSION: No discrete abscess, with some edema/ cellulitis present in the soft tissues. Electronically Signed   By: Ivar Drape M.D.   On: 12/12/2017 12:02     Scheduled Meds: . collagenase   Topical Daily  . feeding supplement (ENSURE ENLIVE)  237 mL Oral BID BM  . furosemide  40 mg Intravenous BID WC  . multivitamin with minerals  1 tablet Oral Daily  . pantoprazole (PROTONIX) IV  40 mg Intravenous Q12H  . potassium chloride  40 mEq Oral Q4H  . warfarin  2.5 mg Oral ONCE-1800  . Warfarin - Pharmacist Dosing Inpatient   Does not apply q1800   Continuous Infusions: . cefTRIAXone (ROCEPHIN)  IV Stopped (12/12/17 2016)  . magnesium sulfate 1 - 4 g bolus IVPB       LOS: 8 days    Time spent: 30 min    Janece Canterbury, MD [facility] Pager [phone]  If 7PM-7AM, please contact night-coverage [email or web address] Password [email or web address] 12/13/2017, 2:40 PM

## 2017-12-14 DIAGNOSIS — I480 Paroxysmal atrial fibrillation: Secondary | ICD-10-CM

## 2017-12-14 DIAGNOSIS — E1139 Type 2 diabetes mellitus with other diabetic ophthalmic complication: Secondary | ICD-10-CM

## 2017-12-14 DIAGNOSIS — N189 Chronic kidney disease, unspecified: Secondary | ICD-10-CM

## 2017-12-14 DIAGNOSIS — I25810 Atherosclerosis of coronary artery bypass graft(s) without angina pectoris: Secondary | ICD-10-CM

## 2017-12-14 DIAGNOSIS — N179 Acute kidney failure, unspecified: Secondary | ICD-10-CM

## 2017-12-14 LAB — CBC
HCT: 36.8 % (ref 36.0–46.0)
Hemoglobin: 11.5 g/dL — ABNORMAL LOW (ref 12.0–15.0)
MCH: 25.9 pg — ABNORMAL LOW (ref 26.0–34.0)
MCHC: 31.3 g/dL (ref 30.0–36.0)
MCV: 82.9 fL (ref 78.0–100.0)
PLATELETS: 343 10*3/uL (ref 150–400)
RBC: 4.44 MIL/uL (ref 3.87–5.11)
RDW: 18.2 % — AB (ref 11.5–15.5)
WBC: 11.4 10*3/uL — AB (ref 4.0–10.5)

## 2017-12-14 LAB — BASIC METABOLIC PANEL
Anion gap: 14 (ref 5–15)
BUN: 33 mg/dL — AB (ref 6–20)
CO2: 24 mmol/L (ref 22–32)
CREATININE: 1.03 mg/dL — AB (ref 0.44–1.00)
Calcium: 8.5 mg/dL — ABNORMAL LOW (ref 8.9–10.3)
Chloride: 99 mmol/L — ABNORMAL LOW (ref 101–111)
GFR calc Af Amer: 57 mL/min — ABNORMAL LOW (ref >60)
GFR, EST NON AFRICAN AMERICAN: 49 mL/min — AB (ref >60)
GLUCOSE: 99 mg/dL (ref 65–99)
Potassium: 4 mmol/L (ref 3.5–5.1)
SODIUM: 137 mmol/L (ref 135–145)

## 2017-12-14 LAB — PROTIME-INR
INR: 2.54
PROTHROMBIN TIME: 27.2 s — AB (ref 11.4–15.2)

## 2017-12-14 MED ORDER — POTASSIUM CHLORIDE CRYS ER 20 MEQ PO TBCR: 40.0000 meq | EXTENDED_RELEASE_TABLET | ORAL | Status: DC

## 2017-12-14 MED ORDER — AMOXICILLIN-POT CLAVULANATE 875-125 MG PO TABS: 1.0000 | ORAL_TABLET | Freq: Two times a day (BID) | ORAL | 0 refills | Status: AC

## 2017-12-14 MED ORDER — WARFARIN SODIUM 2.5 MG PO TABS: 2.5000 mg | ORAL_TABLET | Freq: Every day | ORAL | 0 refills | Status: DC

## 2017-12-14 MED ORDER — COLLAGENASE 250 UNIT/GM EX OINT: TOPICAL_OINTMENT | Freq: Every day | CUTANEOUS | 0 refills | Status: DC

## 2017-12-14 MED ORDER — WARFARIN SODIUM 2.5 MG PO TABS
2.5000 mg | ORAL_TABLET | Freq: Once | ORAL | Status: AC
  Administered 2017-12-14: 2.5 mg via ORAL
  Filled 2017-12-14: qty 1

## 2017-12-14 MED ORDER — ENSURE ENLIVE PO LIQD: 237.0000 mL | Freq: Two times a day (BID) | ORAL | 0 refills | Status: DC

## 2017-12-14 MED ORDER — PANTOPRAZOLE SODIUM 40 MG PO TBEC: 40.0000 mg | DELAYED_RELEASE_TABLET | Freq: Two times a day (BID) | ORAL | Status: DC

## 2017-12-14 NOTE — Progress Notes (Signed)
ANTICOAGULATION CONSULT NOTE - Follow Up Consult  Pharmacy Consult for Warfarin Indication: atrial fibrillation  Allergies  Allergen Reactions  . Ace Inhibitors Other (See Comments)    Intolerance per Dr. Doug Sou note  . Amlodipine Other (See Comments)    Intolerance per Dr. Doug Sou note   . Statins Other (See Comments)    Muscle soreness  . Sulfa Drugs Cross Reactors Itching  . Zetia [Ezetimibe] Other (See Comments)    Muscle soreness  . Fenofibrate Other (See Comments)    Muscle soreness    Patient Measurements: Height: 5\' 10"  (177.8 cm) Weight: 176 lb 12.9 oz (80.2 kg) IBW/kg (Calculated) : 68.5  Vital Signs: Temp: 98.6 F (37 C) (12/15 0621) Temp Source: Axillary (12/15 0621) BP: 118/72 (12/15 0621) Pulse Rate: 102 (12/15 0621)  Labs: Recent Labs    12/12/17 0519 12/13/17 0514 12/14/17 0604  HGB 11.2* 11.5* 11.5*  HCT 35.8* 36.9 36.8  PLT 324 307 343  LABPROT 25.6* 25.4* 27.2*  INR 2.35 2.34 2.54  CREATININE 0.99 1.07* 1.03*    Estimated Creatinine Clearance: 45.5 mL/min (A) (by C-G formula based on SCr of 1.03 mg/dL (H)).   Medications:  Scheduled:  . collagenase   Topical Daily  . feeding supplement (ENSURE ENLIVE)  237 mL Oral BID BM  . furosemide  40 mg Intravenous BID WC  . metolazone  2.5 mg Oral Q3 days  . multivitamin with minerals  1 tablet Oral Daily  . pantoprazole (PROTONIX) IV  40 mg Intravenous Q12H  . Warfarin - Pharmacist Dosing Inpatient   Does not apply q1800   Infusions:  . cefTRIAXone (ROCEPHIN)  IV Stopped (12/13/17 2108)    Assessment: 61 yoF presented from [place] on 12/6 with chills and back pain.  PMH includes chronic anticoagulation on warfarin for afib.    PTA warfarin dose warfarin per NH note is 2.5mg  po daily, except 5mg  on Monday, Wednesday, Friday.  But per Med history is listed as 3.5 mg (2.5mg  tablet + 1 mg tablet) on Sunday, Tuesday, Thursday, Saturday. Admission INR is therapeutic at 2.22  Clarified  warfarin dosing: 3.5mg  daily except 5mg  Mon/Wed/Fri  Today, 12/14/2017: -  INR within therapeutic range at 2.54 (with dose held 12/9-12/11 and restarted back at 2 mg on 12/12) -  CBC: stable -  No bleeding documented -  Diet: Regular but no intake recorded -  No major drug-drug interactions identified. Atibiotics (ceftriaxone) can make patient more sensitive to warfarin  Goal of Therapy:  INR 2-3   Plan:  - Continue warfarin 2.5 mg PO x1 today. If to discharge to SNF today, recommend to continue with 2.5 mg daily while on abx, then switch to previous regimen of 3.5 mg daily except 5 mg MWF when off abx - Daily PT/INR. - Monitor for signs and symptoms of bleeding.  [name], PharmD, BCPS 12/14/2017 9:29 AM

## 2017-12-14 NOTE — NC FL2 (Signed)
[place] LEVEL OF CARE SCREENING TOOL     IDENTIFICATION  Patient Name: [name] Birthdate: 1933/12/03 Sex: female Admission Date (Current Location): 12/05/2017  [facility] and [place] Number:  Herbalist and Address:  [facility],  [place] [address], [place]      Provider Number: [id number]  Attending Physician Name and Address:  Janece Canterbury, MD  Relative Name and Phone Number:       Current Level of Care: Hospital Recommended Level of Care: [place] Prior Approval Number:    Date Approved/Denied:   PASRR Number:    Discharge Plan: SNF    Current Diagnoses: Patient Active Problem List   Diagnosis Date Noted  . Acute kidney injury superimposed on chronic kidney disease ([place]) 12/06/2017  . Cellulitis and abscess of right leg 12/06/2017  . Sepsis ([place]) 12/05/2017  . CKD (chronic kidney disease) stage 3, GFR 30-59 ml/min (HCC) 10/30/2017  . Cellulitis of right leg 10/16/2017  . Hyponatremia 10/16/2017  . Hypomagnesemia 10/16/2017  . Paroxysmal A-fib ([place]) 10/16/2017  . Cellulitis 10/16/2017  . Pulmonary HTN ([place])   . Acute drug-induced gout of right hand   . Fecal incontinence 07/09/2017  . Anemia, iron deficiency 05/15/2016  . Allergic rhinitis 11/19/2015  . Non-proliferative diabetic retinopathy, mild, left eye ([place]) 10/10/2015  . Depression 03/25/2015  . GERD (gastroesophageal reflux disease) 03/25/2015  . Spinal stenosis   . Atrial fibrillation ([place]) 01/08/2014  . Long term current use of anticoagulant therapy 01/08/2014  . Chronic diastolic CHF (congestive heart failure) ([place]) 01/02/2014  . Type II diabetes mellitus with ophthalmic manifestations ([place]) 01/02/2014  . Coronary artery disease   . Lichen sclerosus   . Carotid artery stenosis 04/01/2012  . Hypertensive heart disease with congestive heart failure and stage 3 kidney disease ([place]) 08/29/2011  . Hyperlipidemia 07/18/2011     Orientation RESPIRATION BLADDER Height & Weight     Self, Place  Normal , Continent Weight: 176 lb 12.9 oz (80.2 kg) Height:  5\' 10"  (177.8 cm)  BEHAVIORAL SYMPTOMS/MOOD NEUROLOGICAL BOWEL NUTRITION STATUS    Continent Diet(See DC summary)  AMBULATORY STATUS COMMUNICATION OF NEEDS Skin   Extensive Assist Verbally PU Stage and Appropriate Care     WOC Nurse wound follow up Wound type:abscess I&D right medial thigh Measurement:na Wound bed:na Drainage (amount, consistency, odor) none Periwound: erythema, indurated, warm to touch, painful Dressing procedure/placement/frequency: I originally saw pt 12/7 AM and returned this morning to assess I&D site on right medial thigh. I had planned to pack I&D site with Iodoform gauze on original consult visit but site had closed. On recheck of site the redness, swelling, and warmth have increased. Iodoform placed at bedside in case I&D is performed again, wound can be packed immediately after draining. We will not follow, but will remain available to this patient, to nursing, and the medicReason for Consult:BLE full thickness and partial thickness  ulcers and cellulitis Wound type:venous insufficiency Pressure Injury POA: NA Measurement:left pretibial lateral wound is 2cm x 0.8cm x 0.1cm Medial wound is 4cm x 1.5cm x 0.1cm both have 100% yellow soft slough covering wound beds, scant serous drainage, no odor. Left posterior lower calf has medial wound that is 1cm x 0.5cm and lateral wound that is 1.5cm x 2cm x 0.1cm with pink wound beds, no drainage or odor. Both lower legs have cellulitis. Right upper medial thigh has 3cm round reddened, edematous area with site where MD's perform I&D in ED  yesterday.  I had planned to place Iodoform gauze strip to keep open, however, the incision has closed over. Wound bed:see above Drainage (amount, consistency, odor) see above Periwound:cellulitis Dressing procedure/placement/frequency:I have provided nurses with  orders for Santyl to both pretibial wounds on left leg, Xeroform to left posterior calf wounds as well as right upper thigh wound. Lab results suggest poor nutrition, supplements or nutritional consult suggested for optimum healing of wounds. We will not follow, but will remain available to this patient, to nursing, and the medical and/or surgical teams.   lease re-consult if we need to assist further.                       Personal Care Assistance Level of Assistance  Bathing, Feeding, Dressing Bathing Assistance: Limited assistance Feeding assistance: Limited assistance Dressing Assistance: Limited assistance     Functional Limitations Info  Sight, Hearing, Speech Sight Info: Impaired Hearing Info: Impaired Speech Info: Adequate    SPECIAL CARE FACTORS FREQUENCY  PT (By licensed PT), OT (By licensed OT)     PT Frequency: 5x OT Frequency: 5x            Contractures Contractures Info: Not present    Additional Factors Info  Code Status, Allergies, Isolation Precautions Code Status Info: DNR Allergies Info: Ace Inhibitors, Amlodipine, Statins, Sulfa Drugs Cross Reactors, Zetia Ezetimibe, Fenofibrate     Isolation Precautions Info: MRSA     Current Medications (12/14/2017):  This is the current hospital active medication list Current Facility-Administered Medications  Medication Dose Route Frequency Provider Last Rate Last Dose  . acetaminophen (TYLENOL) tablet 650 mg  650 mg Oral Q6H PRN Fuller Plan A, MD   650 mg at 12/12/17 2053   Or  . acetaminophen (TYLENOL) suppository 650 mg  650 mg Rectal Q6H PRN Fuller Plan A, MD      . cefTRIAXone (ROCEPHIN) 2 g in dextrose 5 % 50 mL IVPB  2 g Intravenous Q24H Hosie Poisson, MD   Stopped at 12/13/17 2108  . collagenase (SANTYL) ointment   Topical Daily Hosie Poisson, MD      . feeding supplement (ENSURE ENLIVE) (ENSURE ENLIVE) liquid 237 mL  237 mL Oral BID BM Short, Mackenzie, MD   237 mL at 12/14/17 0909  .  furosemide (LASIX) injection 40 mg  40 mg Intravenous BID WC Janece Canterbury, MD   40 mg at 12/14/17 0856  . guaiFENesin-dextromethorphan (ROBITUSSIN DM) 100-10 MG/5ML syrup 10 mL  10 mL Oral Q4H PRN Hosie Poisson, MD      . hydrOXYzine (ATARAX/VISTARIL) tablet 10 mg  10 mg Oral BID PRN Fuller Plan A, MD      . levalbuterol (XOPENEX) nebulizer solution 1.25 mg  1.25 mg Nebulization Q6H PRN Schorr, Rhetta Mura, NP   1.25 mg at 12/11/17 0656  . lidocaine (LIDODERM) 5 % 1 patch  1 patch Transdermal Daily PRN Norval Morton, MD   1 patch at 12/06/17 0325  . lip balm (CARMEX) ointment   Topical PRN Hosie Poisson, MD      . metolazone (ZAROXOLYN) tablet 2.5 mg  2.5 mg Oral Q3 days Janece Canterbury, MD   2.5 mg at 12/14/17 0855  . multivitamin with minerals tablet 1 tablet  1 tablet Oral Daily Janece Canterbury, MD   1 tablet at 12/14/17 0855  . ondansetron (ZOFRAN) tablet 4 mg  4 mg Oral Q6H PRN Norval Morton, MD       Or  .  ondansetron (ZOFRAN) injection 4 mg  4 mg Intravenous Q6H PRN Smith, Rondell A, MD      . pantoprazole (PROTONIX) EC tablet 40 mg  40 mg Oral BID Short, Mackenzie, MD      . warfarin (COUMADIN) tablet 2.5 mg  2.5 mg Oral ONCE-1800 Pham, Anh P, RPH      . Warfarin - Pharmacist Dosing Inpatient   Does not apply q1800 Norval Morton, MD         Discharge Medications: Please see discharge summary for a list of discharge medications.  Relevant Imaging Results:  Relevant Lab Results:   Additional Information [id number]  [place], [place]

## 2017-12-14 NOTE — Clinical Social Work Note (Signed)
Clinical Social Work Assessment  Patient Details  Name: [name] MRN: [id number] Date of Birth: 1933/10/02  Date of referral:  12/14/17               Reason for consult:  Discharge Planning                Permission sought to share information with:  Case Manager, Facility Sport and exercise psychologist, Family Supports Permission granted to share information::     Name::        Agency::     Relationship::     Contact Information:     Housing/Transportation Living arrangements for the past 2 months:  Materials engineer, [place] of Information:  Patient, Medical Team, Adult Children, Facility Patient Interpreter Needed:  None Criminal Activity/Legal Involvement Pertinent to Current Situation/Hospitalization:  No - Comment as needed Significant Relationships:  Adult Children, Warehouse manager Lives with:  Facility Resident Do you feel safe going back to the place where you live?  Yes Need for family participation in patient care:  Yes (Comment)  Care giving concerns:  Pt is coming from [place]. Pt c/o lower back pain. Pt fall 3 weeks ago denied pain. Pt reports she was sitting in car when pt back began to burn.   Patient recently placed from [place] to SNF level of care.  Per family, bed hold at [facility] and plans to return to SNF placement. Will need to confirm with facility as this was completed over the weekend and unable to make contact with SW in SNF placement.   Social Worker assessment / plan:  Consulted as patient is from SNF: [facility]. Will update FL2 and plan for patient to return to SNF at discharge. Will make contact with [facility] on Monday.  Appears in notes patient is also active with pallaitve  Care in community following at facility:   [facility] [place] and would like to enroll again for palliative care. Manus Gunning from [facility] needs a verbal order. Manus Gunning phone [phone]  Employment status:   Retired Forensic scientist:  Medicare PT Recommendations:  Not assessed at this time(most likely return to SNF at [facility]) [place] / Referral to community resources:     NA currently Patient/Family's Response to care:  Agreeable   Patient/Family's Understanding of and Emotional Response to Diagnosis, Current Treatment, and Prognosis:  Patient has been somewhat confused off and on during admission possibly due to infection and treatment.   Emotional Assessment Appearance:  Appears stated age Attitude/Demeanor/Rapport:    Affect (typically observed):  Accepting, Adaptable Orientation:  Oriented to Self, Oriented to Place Alcohol / Substance use:  Not Applicable Psych involvement (Current and /or in the community):  No (Comment)  Discharge Needs  Concerns to be addressed:  No discharge needs identified Readmission within the last 30 days:  No Current discharge risk:  None Barriers to Discharge:  No Barriers Identified   [place], LCSW 12/14/2017, 12:36 PM

## 2017-12-14 NOTE — Progress Notes (Signed)
Report called to  [facility] [phone] option 2. No change  From am assesement. Questions, concerns denied.

## 2017-12-14 NOTE — Progress Notes (Signed)
LCSW followed up with RN, daughter and facility.  Daughter adamant about patient discharging back to facility. Call placed to facility and RN on staff for SNF reports that patient can return.  LCSW explained to daughter that typical discharges are set up for Friday and there was no evidence this was completed.  Facility is aware and in agreement. LCSW will fax over all DC paperwork as requested.  Daughter aware and requesting EMS for transport. LCSW will arrange.  No other needs, DC to SNF today.  Lane Hacker, MSW Clinical Social Work: Printmaker Coverage for :  [phone]

## 2017-12-14 NOTE — Discharge Summary (Signed)
Physician Discharge Summary  [name] ERX:[id number] DOB: 1933/05/06 DOA: 12/05/2017  PCP: [name], MD  Admit date: 12/05/2017 Discharge date: 12/14/2017  Admitted From: [facility] facility Disposition:  [facility]  Recommendations for Outpatient Follow-up:  1.  Daily weights and adjustment of her diuretics if she gains more than 3 pounds from the time of discharge 2.  Wound care as below 3.  Continue Augmentin through 12/20 4.  Trend INR.  Continue warfarin 2.5 mg daily while on abx, then switch to previous regimen of 3.5 mg daily except 5 mg MWF when off abx.  5.  Follow up with Cardiology in 2 weeks 6.  Palliative care to follow at facility 7.  Repeat TFTs in 1 month  Home Health: Ongoing physical and occupational therapy Equipment/Devices:   Ulcer on anterior left lower leg:  Cleanse with NS, pat dry, apply nickel thick layer of Santyl, cover with NS moistened gauze, dry gauze, wrap with kerlix, adhere kerlix to kerlix, no tape on skin, perform daily.    Discharge Condition:  Stable, improved CODE STATUS: DNR Diet recommendation: Low-salt diet  Brief/Interim Summary:  [name] a 83 y.o.femalewith medical history significant ofCAD, diastolic CHF, pulmonary HTN, NSTEMI, HTN and atrial fibrillationon coumadin;reports having acute onset of chillsand lower back pain. She was admitted for sepsisfrom cellulitis of the left lower extremity and abscess of the right medial thigh. She was started on broad-spectrum antibiotics with vancomycin and cefepime. The abscess on her medial right thigh was drained but wound culture was not sent for testing.  Blood cultures from admission grew Streptococcus and her antibiotics were narrowed to Rocephin. Wound care was consulted for bilateral lower extremity ulcers, see recommendations above.  Her course was complicated by prolonged hypotension that did not require  vasopressors.  She was given IVF and consequently developed acute on chronic diastolic heart failure and has required diuresis for several days prior to discharge.  She has been diuresing well and her breathing is much less labored and she is on room air.  Recommend that she resume her home diuretics with careful monitoring and adjustment of the doses as needed to continue some mild diuresis daily for now.    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  Blood pressures have improved, she has been afebrile.  She continues to have some erythema of bilateral lower extremities I think due to venous stasis dermatitis.   -  Transitioned to augmentin, last day on 12/20  -  Right thigh:  US demonstrates no residual abscess.  Drained by ER MD on 12/6.  -  TTE demonstrated no evidence of endocarditis -  Repeat blood culture NGTD -  cortisol level wnl  Possible enteritis with ileus seen on CT abdomen pelvis - Eating well, no further diarrhea  Chronic atrial fibrillation, currently rate controlled -  Held metoprolol due to recent hypotension, but resumed at discharge -  Continue Coumadin, INR therapeutic  Lumbar fractures, age indeterminate, likely secondary to the fall few weeks ago.  - PT recommending SNF  Acute respiratory failure with hypoxia secondary to acute on chronic diastolic heart failure and right heart  failure caused by hypotension which necessitated holding diuretics.  Diuresed well with metolazone intermittently with lasix 40mg  IV BID. -Resume metolazone every 3 days with torsemide - ECHO demonstrated severely dilated right ventricle with  severe reduced RV systolic function, severe TR, and severe pulmonary hypertension   Elevated troponins, probably from demand ischemia from sepsis.On admission,physicianspoke with Dr. Percival Spanish, Cardiology, who recommended noischemic workup at this time. Patient denied any chest pain shortness of breath or presyncope.  ECHO was performed that demonstrated no regional wall abnormalities, preserved LVEF.    Type 2 diabetes mellitus, last A1c is 5.5, holding metformin. Not requiring much insulin and at risk for hypoglycemia, stop SSI.  Chronic venous stasis ulcerations Wound care consulted and recommendations as above  Possible hepatocellular carcinoma No workup as per the family as per the daughter at bedside.  Sick euthyroid, repeat TFTs in 4 weeks.  Hypokalemia and hypomagnesemia secondary to diuretics, given oral potassium repletion and IV magnesium repletion    Discharge Instructions     Medication List    STOP taking these medications   doxycycline 100 MG tablet Commonly known as:  VIBRA-TABS   furosemide 80 MG tablet Commonly known as:  LASIX   insulin aspart 100 UNIT/ML injection Commonly known as:  NOVOLOG   traMADol 50 MG tablet Commonly known as:  ULTRAM     TAKE these medications   acetaminophen 500 MG tablet Commonly known as:  TYLENOL Take 1,000 mg 2 (two) times daily by mouth.   amoxicillin-clavulanate 875-125 MG tablet Commonly known as:  AUGMENTIN Take 1 tablet by mouth every 12 (twelve) hours for 5 days.   collagenase ointment Commonly known as:  SANTYL Apply topically daily. Apply to pretibial ulcers   febuxostat 40 MG tablet Commonly known as:  ULORIC Take 1 tablet (40 mg total) by mouth daily.   feeding supplement (ENSURE ENLIVE) Liqd Take 237 mLs by mouth 2 (two) times daily between meals.   hydrOXYzine 10 MG tablet Commonly known as:  ATARAX/VISTARIL Take 10 mg by mouth 2 (two) times daily as needed for itching.   loratadine  10 MG tablet Commonly known as:  CLARITIN Take 10 mg daily by mouth.   magnesium oxide 400 MG tablet Commonly known as:  MAG-OX Take 400 mg by mouth daily.   metolazone 2.5 MG tablet Commonly known as:  ZAROXOLYN Take 2.5 mg every 3 (three) days by mouth.   metoprolol succinate 25 MG 24 hr tablet Commonly known as:  TOPROL-XL Take 12.5 mg by mouth daily. Hold for SBP<100 or HR <60   mineral oil-hydrophilic petrolatum ointment Apply 1 application topically 2 (two) times daily as needed for dry skin.   multivitamin tablet Take 1 tablet by mouth daily.   nitroGLYCERIN 0.4 MG SL tablet Commonly known as:  NITROSTAT Place 1 tablet (0.4 mg total) under the tongue every 5 (five) minutes as needed. For chest pain   omeprazole 20 MG capsule Commonly known as:  PRILOSEC Take 1 capsule (20 mg total) by mouth daily.   polyethylene glycol packet Commonly known as:  MIRALAX / GLYCOLAX Take 17 g by mouth daily.   Potassium Chloride ER 20 MEQ Tbcr Take 20 mEq by mouth 2 (two) times daily.   saccharomyces boulardii 250 MG capsule Commonly known as:  FLORASTOR Take 250 mg by mouth 2 (two) times daily.   torsemide 20 MG tablet Commonly known as:  DEMADEX Take 40 mg by mouth 2 (two) times daily. Hold for SBP<110   warfarin 2.5  MG tablet Commonly known as:  COUMADIN Take as directed. If you are unsure how to take this medication, talk to your nurse or doctor. Original instructions:  Take 1 tablet (2.5 mg total) by mouth daily at 6 PM for 6 doses. What changed:    medication strength  how much to take  when to take this  additional instructions  Another medication with the same name was removed. Continue taking this medication, and follow the directions you see here.      Follow-up Information    [name], MD. Schedule an appointment as soon as possible for a visit in 5 day(s).   Specialty:  Family Medicine Contact information: [address] [place]  [place] [address] [phone]        [name], [name], MD. Schedule an appointment as soon as possible for a visit in 2 week(s).   Specialty:  Cardiology Contact information: [address] [place] [address] [phone]          Allergies  Allergen Reactions  . Ace Inhibitors Other (See Comments)    Intolerance per Dr. Doug Sou note  . Amlodipine Other (See Comments)    Intolerance per Dr. Doug Sou note   . Statins Other (See Comments)    Muscle soreness  . Sulfa Drugs Cross Reactors Itching  . Zetia [Ezetimibe] Other (See Comments)    Muscle soreness  . Fenofibrate Other (See Comments)    Muscle soreness    Consultations: SLP Wound care Phone consult with cardiology.  Procedures/Studies: Dg Chest 2 View  Result Date: 12/10/2017 CLINICAL DATA:  Followup.  Cough EXAM: CHEST  2 VIEW COMPARISON:  12/05/2017 FINDINGS: Small left more than right pleural effusions that are layering. Low volume chest with vascular congestion. Chronic cardiomegaly. Status post CABG and coronary stenting. No air bronchogram. No pneumothorax. IMPRESSION: 1. Increased but still small pleural effusions. 2. Cardiomegaly and vascular congestion. Electronically Signed   By: Monte Fantasia M.D.   On: 12/10/2017 16:14   Ct L-spine No Charge  Result Date: 12/05/2017 CLINICAL DATA:  Initial evaluation for acute lower back pain, recent fall. EXAM: CT LUMBAR SPINE WITHOUT CONTRAST TECHNIQUE: Multidetector CT imaging of the lumbar spine was performed without intravenous contrast administration. Multiplanar CT image reconstructions were also generated. COMPARISON:  Prior radiograph from 02/22/2014. FINDINGS: Segmentation: Normal segmentation. Lowest well-formed disc labeled the L5-S1 level. Alignment: Severe levoscoliosis with apex at L2-3. Exaggeration of the normal lumbar lordosis. Trace retrolisthesis of T12 on L1, L1 on L2, and L2 on L3, with trace anterolisthesis of L4 on L5. Vertebrae: Vertebral  body heights maintained without evidence for acute or chronic vertebral body fracture. Visualized sacrum and pelvis intact. Fractures involving the left transverse processes of L2 (series 8, image 33) and L3 (series 8, image 46), somewhat age indeterminate, but favored to be at least subacute in nature. No discrete lytic or blastic osseous lesions. Paraspinal and other soft tissues: Diffuse dependent edema present within the subcutaneous fat of the lower back. Chronic fatty atrophy noted within the posterior paraspinous musculature. Advanced aortic atherosclerosis. Visualized visceral structures grossly unremarkable. Disc levels: T12-L1: Diffuse disc desiccation with intervertebral disc space narrowing and reactive endplate changes. Mild facet hypertrophy. No significant stenosis. L1-2: Chronic intervertebral disc space narrowing with disc desiccation. Reactive endplate changes. Mild facet hypertrophy. No significant spinal stenosis. Moderate to severe right L1 foraminal narrowing. No significant left foraminal encroachment. L2-3: Diffuse disc bulge with disc desiccation. Severe right with moderate left facet arthrosis. Mild  effacement of the right lateral recess without significant canal stenosis. Moderate right with mild left L2 foraminal narrowing. L3-4: Diffuse disc bulge with disc desiccation. Right extraforaminal chronic reactive endplate changes. Advanced facet arthrosis, right worse than left. Ligamentum flavum thickening. Mild canal with mild to moderate lateral recess narrowing, worse on the right. Moderate right L3 foraminal narrowing. L4-5: Diffuse disc bulge with disc desiccation. Severe bilateral facet arthrosis with ligamentum flavum thickening. Resultant moderate to severe canal with bilateral lateral recess stenosis, right worse than left. Mild bilateral L4 foraminal narrowing. L5-S1: Chronic intervertebral disc space narrowing, greater on the left. Diffuse disc bulge with annular calcification.  Severe facet arthrosis. No significant canal or lateral recess stenosis. Severe left L5 foraminal narrowing. IMPRESSION: 1. Minimally displaced fractures involving the left transverse processes of L2 and L3. Findings are somewhat age indeterminate, but suspected to at least be subacute in nature. 2. No other acute abnormality within the lumbar spine. 3. Severe levoscoliosis with associated multilevel degenerative spondylolysis and advanced facet arthrosis. Resultant moderate to severe spinal stenosis at L4-5. 4. Multifactorial degenerative changes with resultant multilevel foraminal narrowing as above, most notable at L1 and L2 on the right and L5 on the left. Electronically Signed   By: Jeannine Boga M.D.   On: 12/05/2017 20:18   Dg Chest Port 1 View  Result Date: 12/11/2017 CLINICAL DATA:  Acute onset of shortness of breath and wheezing. Lung crackles. EXAM: PORTABLE CHEST 1 VIEW COMPARISON:  Chest radiograph performed 12/10/2017 FINDINGS: The lungs are well-aerated. Small bilateral pleural effusions are noted. Vascular congestion is noted. Increased interstitial markings may reflect mild interstitial edema. There is no evidence of pneumothorax. The cardiomediastinal silhouette is mildly enlarged. The patient is status post median sternotomy, with evidence of prior CABG. No acute osseous abnormalities are seen. IMPRESSION: Small bilateral pleural effusions. Vascular congestion and mild cardiomegaly. Increased interstitial markings raise concern for mild interstitial edema. This is somewhat more apparent than on the prior study. Electronically Signed   By: Garald Balding M.D.   On: 12/11/2017 21:27   Dg Chest Port 1 View  Result Date: 12/05/2017 CLINICAL DATA:  Shortness of Breath EXAM: PORTABLE CHEST 1 VIEW COMPARISON:  10/16/2017 FINDINGS: Cardiac shadow remains enlarged. Postsurgical changes are again seen. Central vascular congestion is noted with mild interstitial edema. No sizable effusion or  infiltrate is seen. No bony abnormality is noted. IMPRESSION: Mild CHF. Electronically Signed   By: Inez Catalina M.D.   On: 12/05/2017 21:28   [place] Lt Lower Extrem Ltd Soft Tissue Non Vascular  Result Date: 12/12/2017 CLINICAL DATA:  Wound in the right mid medial thigh EXAM: ULTRASOUND right LOWER EXTREMITY LIMITED TECHNIQUE: Ultrasound examination of the lower extremity soft tissues was performed in the area of clinical concern. COMPARISON:  None FINDINGS: Ultrasound over the area in question was performed. No discrete abscess is seen. However there is fluid interspersed among soft tissues of the mid medial right thigh consistent with edema/cellulitis. IMPRESSION: No discrete abscess, with some edema/ cellulitis present in the soft tissues. Electronically Signed   By: Ivar Drape M.D.   On: 12/12/2017 12:02   Ct Renal Stone Study  Result Date: 12/05/2017 CLINICAL DATA:  Low back pain post fall 3 weeks ago. EXAM: CT ABDOMEN AND PELVIS WITHOUT CONTRAST TECHNIQUE: Multidetector CT imaging of the abdomen and pelvis was performed following the standard protocol without IV contrast. COMPARISON:  Body CT 05/02/2011 FINDINGS: Lower chest: Small bilateral pleural effusions. Marked cardiomegaly. Calcific atherosclerotic disease of the  coronary arteries and aorta. Mitral valve calcifications. Large hiatal hernia. Hepatobiliary: The liver contour is nodular with enlargement of the lateral segment, suggestive of possible liver cirrhosis. The gallbladder is normal. There is a 2.1 cm indeterminate hypoattenuated mass within the right dome of the liver. Second smaller 9 mm mass is seen inferiorly. Pancreas: Unremarkable. No pancreatic ductal dilatation or surrounding inflammatory changes. Spleen: Normal in size without focal abnormality. Adrenals/Urinary Tract: Adrenal glands are unremarkable. Kidneys are normal, without renal calculi, focal lesion, or hydronephrosis. Bladder is unremarkable. Stomach/Bowel: Stomach is  fluid-filled, normally distended. Duodenal diverticulum again noted. There are borderline enlarged small bowels in the left central abdomen measuring up to 3.3 cm in cross-section, with associated smooth mucosal thickening. Vascular/Lymphatic: Aortic atherosclerosis. No enlarged abdominal or pelvic lymph nodes. Diffuse mesenteric edema. Reproductive: Again seen is a right adnexal thick-walled cystic lesion, which measures 1.7 cm in diameter. Other: Low-density abdominal and pelvic ascites. Transverse gas bubble in the lower central abdomen seen on image 64/89 is noted, and favored to represent intraluminal gas. Musculoskeletal: No fracture is seen. Lumbosacral spine scoliosis with extensive osteoarthritic changes. Abdominal wall edema. Small amount of gas within the musculature of the right buttock may be post injection. IMPRESSION: Study degraded by motion artifact and lack of IV contrast. No evidence of acute traumatic injury to the abdomen or pelvis. Possible liver cirrhosis with moderate amount of ascites within the abdomen and pelvis. Two indeterminate liver masses. Primary hepatocellular carcinoma or metastatic lesions cannot be excluded. Borderline enlargement of small bowel loops in the left abdomen with diffuse mucosal thickening. This may represent enteritis with mild ileus. Small bilateral pleural effusions. Marked cardiomegaly. Calcific atherosclerotic disease of the aorta and coronary arteries. Thick-walled cystic mass in the right adnexa, which has not increased in size from 2012 and therefore is either benign or represents an indolent slow growing malignancy. Electronically Signed   By: Fidela Salisbury M.D.   On: 12/05/2017 20:16   Subjective: Denies SOB.  Feeling much better and would like to go home.  Thinks that her thinking is clearer today.  Denies pains in legs.    Discharge Exam: Vitals:   12/13/17 2045 12/14/17 0621  BP: 123/64 118/72  Pulse: 93 (!) 102  Resp: 20 18  Temp: 97.8  F (36.6 C) 98.6 F (37 C)  SpO2: 94% 93%   Vitals:   12/13/17 0300 12/13/17 1700 12/13/17 2045 12/14/17 0621  BP: (!) 122/58 112/66 123/64 118/72  Pulse: 94 88 93 (!) 102  Resp: 19 20 20 18   Temp: 98 F (36.7 C) 98 F (36.7 C) 97.8 F (36.6 C) 98.6 F (37 C)  TempSrc: Oral Oral Oral Axillary  SpO2: 95% 95% 94% 93%  Weight: 80.2 kg (176 lb 12.9 oz)   80.2 kg (176 lb 12.9 oz)  Height:        General: Pt is alert, awake, not in acute distress Cardiovascular: RRR, S1/S2 +, no rubs, no gallops Respiratory: Wheezing bilaterally, rales have improved, no rhonchi Abdominal: Soft, NT, ND, bowel sounds + Extremities: 1+ pitting edema, no cyanosis, legs are red from stasis dermatitis but no hot pink areas concerning for cellulitis.  Anterior shin wound continues to close with increasing amounts of granulation tissue distally.  Right thigh abscessed area is healing well.     The results of significant diagnostics from this hospitalization (including imaging, microbiology, ancillary and laboratory) are listed below for reference.     Microbiology: Recent Results (from the past 240 hour(s))  Blood culture (routine x 2)     Status: Abnormal   Collection Time: 12/05/17  8:56 PM  Result Value Ref Range Status   Specimen Description BLOOD LEFT ANTECUBITAL  Final   Special Requests   Final    BOTTLES DRAWN AEROBIC AND ANAEROBIC Blood Culture adequate volume   Culture  Setup Time   Final    GRAM POSITIVE COCCI IN CHAINS ANAEROBIC BOTTLE ONLY CRITICAL VALUE NOTED.  VALUE IS CONSISTENT WITH PREVIOUSLY REPORTED AND CALLED VALUE.    Culture (A)  Final    STREPTOCOCCUS DYSGALACTIAE SUSCEPTIBILITIES PERFORMED ON PREVIOUS CULTURE WITHIN THE LAST 5 DAYS. Performed at [facility], [place] [address] [place], [place] [address]    Report Status 12/08/2017 FINAL  Final  Blood culture (routine x 2)     Status: Abnormal   Collection Time: 12/05/17  9:01 PM  Result Value Ref Range Status    Specimen Description BLOOD BLOOD RIGHT FOREARM  Final   Special Requests   Final    BOTTLES DRAWN AEROBIC AND ANAEROBIC Blood Culture adequate volume   Culture  Setup Time   Final    GRAM POSITIVE COCCI IN CHAINS IN BOTH AEROBIC AND ANAEROBIC BOTTLES CRITICAL RESULT CALLED TO, READ BACK BY AND VERIFIED WITH: EBurman Foster.D. 14:30 12/06/17 ([email or web address]) Performed at [facility], [place] [address], [place], [place] [address]    Culture STREPTOCOCCUS DYSGALACTIAE (A)  Final   Report Status 12/08/2017 FINAL  Final   Organism ID, Bacteria STREPTOCOCCUS DYSGALACTIAE  Final      Susceptibility   Streptococcus dysgalactiae - MIC*    CLINDAMYCIN >=1 RESISTANT Resistant     AMPICILLIN <=0.25 SENSITIVE Sensitive     ERYTHROMYCIN >=8 RESISTANT Resistant     VANCOMYCIN 0.25 SENSITIVE Sensitive     CEFTRIAXONE <=0.12 SENSITIVE Sensitive     LEVOFLOXACIN 0.5 SENSITIVE Sensitive     * STREPTOCOCCUS DYSGALACTIAE  Blood Culture ID Panel (Reflexed)     Status: Abnormal   Collection Time: 12/05/17  9:01 PM  Result Value Ref Range Status   Enterococcus species NOT DETECTED NOT DETECTED Final   Listeria monocytogenes NOT DETECTED NOT DETECTED Final   Staphylococcus species NOT DETECTED NOT DETECTED Final   Staphylococcus aureus NOT DETECTED NOT DETECTED Final   Streptococcus species DETECTED (A) NOT DETECTED Final    Comment: Not Enterococcus species, Streptococcus agalactiae, Streptococcus pyogenes, or Streptococcus pneumoniae. CRITICAL RESULT CALLED TO, READ BACK BY AND VERIFIED WITH: EBurman Foster.D. 14:30 12/06/17 ([email or web address])    Streptococcus agalactiae NOT DETECTED NOT DETECTED Final   Streptococcus pneumoniae NOT DETECTED NOT DETECTED Final   Streptococcus pyogenes NOT DETECTED NOT DETECTED Final   Acinetobacter baumannii NOT DETECTED NOT DETECTED Final   Enterobacteriaceae species NOT DETECTED NOT DETECTED Final   Enterobacter cloacae complex NOT DETECTED NOT DETECTED Final   Escherichia  coli NOT DETECTED NOT DETECTED Final   Klebsiella oxytoca NOT DETECTED NOT DETECTED Final   Klebsiella pneumoniae NOT DETECTED NOT DETECTED Final   Proteus species NOT DETECTED NOT DETECTED Final   Serratia marcescens NOT DETECTED NOT DETECTED Final   Haemophilus influenzae NOT DETECTED NOT DETECTED Final   Neisseria meningitidis NOT DETECTED NOT DETECTED Final   Pseudomonas aeruginosa NOT DETECTED NOT DETECTED Final   Candida albicans NOT DETECTED NOT DETECTED Final   Candida glabrata NOT DETECTED NOT DETECTED Final   Candida krusei NOT DETECTED NOT DETECTED Final   Candida parapsilosis NOT DETECTED NOT DETECTED Final   Candida tropicalis  NOT DETECTED NOT DETECTED Final    Comment: Performed at [facility], [place] [address], [place], [place] [address]  MRSA PCR Screening     Status: Abnormal   Collection Time: 12/06/17  1:20 AM  Result Value Ref Range Status   MRSA by PCR POSITIVE (A) NEGATIVE Final    Comment:        The GeneXpert MRSA Assay (FDA approved for NASAL specimens only), is one component of a comprehensive MRSA colonization surveillance program. It is not intended to diagnose MRSA infection nor to guide or monitor treatment for MRSA infections. RESULT CALLED TO, READ BACK BY AND VERIFIED WITH: SCOTT,B RN 12.7.18 @0515  ZANDO,C   Culture, blood (single) w Reflex to ID Panel     Status: None (Preliminary result)   Collection Time: 12/11/17  8:07 AM  Result Value Ref Range Status   Specimen Description BLOOD LEFT ANTECUBITAL  Final   Special Requests   Final    BOTTLES DRAWN AEROBIC AND ANAEROBIC Blood Culture adequate volume   Culture   Final    NO GROWTH 2 DAYS Performed at [facility], [address] [place], [place] [address]    Report Status PENDING  Incomplete     Labs: BNP (last 3 results) Recent Labs    09/18/17 1810 10/16/17 1914 12/05/17 1853  BNP 2,268.0* 2,495.1* 3,220.2*   Basic Metabolic Panel: Recent Labs  Lab 12/09/17 1201  12/10/17 0920 12/11/17 0457 12/12/17 0519 12/13/17 0514 12/14/17 0604  NA 138  --  134* 137 134* 137  K 3.0* 3.7 3.3* 3.3* 3.2* 4.0  CL 105  --  101 100* 98* 99*  CO2 23  --  22 26 24 24   GLUCOSE 96  --  101* 104* 99 99  BUN 44*  --  39* 37* 32* 33*  CREATININE 1.15*  --  1.01* 0.99 1.07* 1.03*  CALCIUM 8.7*  --  8.4* 8.5* 8.3* 8.5*  MG  --   --   --   --  1.1*  --    Liver Function Tests: No results for input(s): AST, ALT, ALKPHOS, BILITOT, PROT, ALBUMIN in the last 168 hours. No results for input(s): LIPASE, AMYLASE in the last 168 hours. No results for input(s): AMMONIA in the last 168 hours. CBC: Recent Labs  Lab 12/09/17 0328 12/11/17 0457 12/12/17 0519 12/13/17 0514 12/14/17 0604  WBC 11.5* 12.3* 13.4* 12.3* 11.4*  HGB 10.8* 11.4* 11.2* 11.5* 11.5*  HCT 34.0* 36.3 35.8* 36.9 36.8  MCV 82.1 82.9 82.7 82.6 82.9  PLT 267 299 324 307 343   Cardiac Enzymes: No results for input(s): CKTOTAL, CKMB, CKMBINDEX, TROPONINI in the last 168 hours. BNP: Invalid input(s): POCBNP CBG: Recent Labs  Lab 12/11/17 1641 12/11/17 2129 12/12/17 0752 12/12/17 1215 12/12/17 1719  GLUCAP 113* 138* 84 93 95   D-Dimer No results for input(s): DDIMER in the last 72 hours. Hgb A1c No results for input(s): HGBA1C in the last 72 hours. Lipid Profile No results for input(s): CHOL, HDL, LDLCALC, TRIG, CHOLHDL, LDLDIRECT in the last 72 hours. Thyroid function studies No results for input(s): TSH, T4TOTAL, T3FREE, THYROIDAB in the last 72 hours.  Invalid input(s): FREET3 Anemia work up No results for input(s): VITAMINB12, FOLATE, FERRITIN, TIBC, IRON, RETICCTPCT in the last 72 hours. Urinalysis    Component Value Date/Time   COLORURINE STRAW (A) 12/12/2017 1837   APPEARANCEUR CLEAR 12/12/2017 1837   LABSPEC 1.005 12/12/2017 1837   PHURINE 5.0 12/12/2017 1837  GLUCOSEU NEGATIVE 12/12/2017 1837   HGBUR NEGATIVE 12/12/2017 1837   BILIRUBINUR NEGATIVE 12/12/2017 1837   KETONESUR  NEGATIVE 12/12/2017 1837   PROTEINUR NEGATIVE 12/12/2017 1837   UROBILINOGEN 0.2 02/03/2013 1217   NITRITE NEGATIVE 12/12/2017 1837   LEUKOCYTESUR TRACE (A) 12/12/2017 1837   Sepsis Labs Invalid input(s): PROCALCITONIN,  WBC,  LACTICIDVEN   Time coordinating discharge: Over 30 minutes  SIGNED:   Janece Canterbury, MD  [facility] 12/14/2017, 1:20 PM Pager   If 7PM-7AM, please contact night-coverage [email or web address] Password [email or web address]

## 2017-12-16 ENCOUNTER — Encounter: Payer: Self-pay | Admitting: Internal Medicine

## 2017-12-16 ENCOUNTER — Non-Acute Institutional Stay (SKILLED_NURSING_FACILITY): Payer: Medicare Other | Admitting: Internal Medicine

## 2017-12-16 DIAGNOSIS — R5381 Other malaise: Secondary | ICD-10-CM

## 2017-12-16 DIAGNOSIS — I5032 Chronic diastolic (congestive) heart failure: Secondary | ICD-10-CM | POA: Diagnosis not present

## 2017-12-16 DIAGNOSIS — D638 Anemia in other chronic diseases classified elsewhere: Secondary | ICD-10-CM | POA: Diagnosis not present

## 2017-12-16 DIAGNOSIS — L03116 Cellulitis of left lower limb: Secondary | ICD-10-CM

## 2017-12-16 DIAGNOSIS — L02415 Cutaneous abscess of right lower limb: Secondary | ICD-10-CM

## 2017-12-16 DIAGNOSIS — N183 Chronic kidney disease, stage 3 unspecified: Secondary | ICD-10-CM

## 2017-12-16 DIAGNOSIS — Z7189 Other specified counseling: Secondary | ICD-10-CM | POA: Diagnosis not present

## 2017-12-16 DIAGNOSIS — R7989 Other specified abnormal findings of blood chemistry: Secondary | ICD-10-CM | POA: Diagnosis not present

## 2017-12-16 DIAGNOSIS — I83009 Varicose veins of unspecified lower extremity with ulcer of unspecified site: Secondary | ICD-10-CM | POA: Diagnosis not present

## 2017-12-16 DIAGNOSIS — B955 Unspecified streptococcus as the cause of diseases classified elsewhere: Secondary | ICD-10-CM

## 2017-12-16 DIAGNOSIS — R7881 Bacteremia: Secondary | ICD-10-CM | POA: Diagnosis not present

## 2017-12-16 DIAGNOSIS — L97909 Non-pressure chronic ulcer of unspecified part of unspecified lower leg with unspecified severity: Secondary | ICD-10-CM

## 2017-12-16 DIAGNOSIS — D72829 Elevated white blood cell count, unspecified: Secondary | ICD-10-CM

## 2017-12-16 DIAGNOSIS — I482 Chronic atrial fibrillation, unspecified: Secondary | ICD-10-CM

## 2017-12-16 DIAGNOSIS — K219 Gastro-esophageal reflux disease without esophagitis: Secondary | ICD-10-CM

## 2017-12-16 DIAGNOSIS — R195 Other fecal abnormalities: Secondary | ICD-10-CM | POA: Diagnosis not present

## 2017-12-16 LAB — CULTURE, BLOOD (SINGLE)
CULTURE: NO GROWTH
SPECIAL REQUESTS: ADEQUATE
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
## 2017-12-19 ENCOUNTER — Inpatient Hospital Stay: Payer: Medicare Other | Admitting: Family Medicine

## 2017-12-19 ENCOUNTER — Non-Acute Institutional Stay (SKILLED_NURSING_FACILITY): Payer: Medicare Other | Admitting: Family

## 2017-12-19 ENCOUNTER — Encounter: Payer: Self-pay | Admitting: Family

## 2017-12-19 DIAGNOSIS — R058 Other specified cough: Secondary | ICD-10-CM

## 2017-12-19 DIAGNOSIS — I5032 Chronic diastolic (congestive) heart failure: Secondary | ICD-10-CM | POA: Diagnosis not present

## 2017-12-19 DIAGNOSIS — R05 Cough: Secondary | ICD-10-CM
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
## 2017-12-23 DIAGNOSIS — M479 Spondylosis, unspecified: Secondary | ICD-10-CM | POA: Insufficient documentation

## 2017-12-23 DIAGNOSIS — M419 Scoliosis, unspecified: Secondary | ICD-10-CM | POA: Insufficient documentation

## 2017-12-23 DIAGNOSIS — I509 Heart failure, unspecified: Secondary | ICD-10-CM | POA: Insufficient documentation

## 2017-12-23 DIAGNOSIS — R42 Dizziness and giddiness: Secondary | ICD-10-CM | POA: Insufficient documentation

## 2017-12-23 DIAGNOSIS — I6529 Occlusion and stenosis of unspecified carotid artery: Secondary | ICD-10-CM | POA: Insufficient documentation

## 2017-12-23 DIAGNOSIS — M1990 Unspecified osteoarthritis, unspecified site: Secondary | ICD-10-CM | POA: Insufficient documentation

## 2017-12-23 DIAGNOSIS — E119 Type 2 diabetes mellitus without complications: Secondary | ICD-10-CM | POA: Insufficient documentation

## 2017-12-23 DIAGNOSIS — I10 Essential (primary) hypertension: Secondary | ICD-10-CM | POA: Insufficient documentation

## 2017-12-23 DIAGNOSIS — D649 Anemia, unspecified: Secondary | ICD-10-CM | POA: Insufficient documentation

## 2017-12-23 DIAGNOSIS — I209 Angina pectoris, unspecified: Secondary | ICD-10-CM | POA: Insufficient documentation

## 2017-12-23 DIAGNOSIS — E669 Obesity, unspecified: Secondary | ICD-10-CM | POA: Insufficient documentation

## 2017-12-23 LAB — COMPLETE METABOLIC PANEL WITH GFR
ALT: 15
AST: 29
Albumin: 3.1
Alkaline Phosphatase: 84
BILIRUBIN TOTAL: 0.7
BUN: 41 — AB (ref 4–21)
Calcium: 8.6
Creat: 1.26
GLUCOSE: 107
POTASSIUM: 4.2
SODIUM: 137
Total Protein: 6.4 g/dL

## 2017-12-23 LAB — CBC
HCT: 34.9
HGB: 11.1
PLATELET COUNT: 319
WBC: 7

## 2017-12-25 ENCOUNTER — Encounter: Payer: Self-pay | Admitting: *Deleted

## 2017-12-30 ENCOUNTER — Telehealth: Payer: Self-pay | Admitting: Internal Medicine

## 2017-12-30 NOTE — Telephone Encounter (Signed)
Pt had CXR one week ago to r/o pna. Was negative.  Now pt sob and POX 86.  Requesting O2--advised 2L via [place] to keep sats over 90%.  Of note, weight 171.8 lb yesterday and 170.9 lb before that.  178 lbs before that.  Lasix is being held for low bp.  No changes in baseline edema.  May need reassessment.

## 2018-01-01 ENCOUNTER — Non-Acute Institutional Stay (SKILLED_NURSING_FACILITY): Payer: Medicare Other | Admitting: Internal Medicine

## 2018-01-01 ENCOUNTER — Encounter: Payer: Self-pay | Admitting: Internal Medicine

## 2018-01-01 ENCOUNTER — Ambulatory Visit: Payer: Medicare Other | Admitting: Physician Assistant

## 2018-01-01 DIAGNOSIS — I5033 Acute on chronic diastolic (congestive) heart failure: Secondary | ICD-10-CM

## 2018-01-01 DIAGNOSIS — J9601 Acute respiratory failure with hypoxia: Secondary | ICD-10-CM
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
## 2018-01-01 NOTE — Telephone Encounter (Signed)
Pt has been evaluated

## 2018-01-02 LAB — BASIC METABOLIC PANEL
BUN: 54 — AB (ref 4–21)
CREATININE: 1.6 — AB (ref 0.5–1.1)
GLUCOSE: 92
Potassium: 3.8 (ref 3.4–5.3)
Sodium: 138 (ref 137–147)

## 2018-01-03 ENCOUNTER — Non-Acute Institutional Stay (SKILLED_NURSING_FACILITY): Payer: Medicare Other | Admitting: Internal Medicine

## 2018-01-03 ENCOUNTER — Encounter: Payer: Self-pay | Admitting: Internal Medicine

## 2018-01-03 DIAGNOSIS — J9601 Acute respiratory failure with hypoxia: Secondary | ICD-10-CM | POA: Diagnosis not present

## 2018-01-03 DIAGNOSIS — I5033 Acute on chronic diastolic (congestive) heart failure: Secondary | ICD-10-CM | POA: Diagnosis not present

## 2018-01-03 DIAGNOSIS — J181 Lobar pneumonia, unspecified organism: Secondary | ICD-10-CM | POA: Diagnosis not present

## 2018-01-03 DIAGNOSIS — J189 Pneumonia, unspecified organism: Secondary | ICD-10-CM
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
## 2018-01-04 DIAGNOSIS — I272 Pulmonary hypertension, unspecified: Secondary | ICD-10-CM | POA: Diagnosis not present

## 2018-01-04 DIAGNOSIS — L03119 Cellulitis of unspecified part of limb: Secondary | ICD-10-CM | POA: Diagnosis not present

## 2018-01-04 DIAGNOSIS — D376 Neoplasm of uncertain behavior of liver, gallbladder and bile ducts: Secondary | ICD-10-CM | POA: Diagnosis not present

## 2018-01-04 DIAGNOSIS — I4891 Unspecified atrial fibrillation: Secondary | ICD-10-CM | POA: Diagnosis not present

## 2018-01-04 DIAGNOSIS — R609 Edema, unspecified: Secondary | ICD-10-CM | POA: Diagnosis not present

## 2018-01-04 DIAGNOSIS — K219 Gastro-esophageal reflux disease without esophagitis: Secondary | ICD-10-CM | POA: Diagnosis not present

## 2018-01-04 DIAGNOSIS — I2510 Atherosclerotic heart disease of native coronary artery without angina pectoris: Secondary | ICD-10-CM | POA: Diagnosis not present

## 2018-01-04 DIAGNOSIS — F0280 Dementia in other diseases classified elsewhere without behavioral disturbance: Secondary | ICD-10-CM | POA: Diagnosis not present

## 2018-01-04 DIAGNOSIS — N183 Chronic kidney disease, stage 3 (moderate): Secondary | ICD-10-CM | POA: Diagnosis not present

## 2018-01-04 DIAGNOSIS — I509 Heart failure, unspecified: Secondary | ICD-10-CM | POA: Diagnosis not present

## 2018-01-04 DIAGNOSIS — R0902 Hypoxemia: Secondary | ICD-10-CM | POA: Diagnosis not present

## 2018-01-04 DIAGNOSIS — M109 Gout, unspecified: Secondary | ICD-10-CM | POA: Diagnosis not present

## 2018-01-04 DIAGNOSIS — R634 Abnormal weight loss: Secondary | ICD-10-CM | POA: Diagnosis not present

## 2018-01-04 DIAGNOSIS — R630 Anorexia: Secondary | ICD-10-CM | POA: Diagnosis not present

## 2018-01-05 DIAGNOSIS — R630 Anorexia: Secondary | ICD-10-CM | POA: Diagnosis not present

## 2018-01-05 DIAGNOSIS — L03119 Cellulitis of unspecified part of limb: Secondary | ICD-10-CM | POA: Diagnosis not present

## 2018-01-05 DIAGNOSIS — I509 Heart failure, unspecified: Secondary | ICD-10-CM | POA: Diagnosis not present

## 2018-01-05 DIAGNOSIS — R609 Edema, unspecified: Secondary | ICD-10-CM | POA: Diagnosis not present

## 2018-01-05 DIAGNOSIS — R0902 Hypoxemia: Secondary | ICD-10-CM | POA: Diagnosis not present

## 2018-01-05 DIAGNOSIS — R634 Abnormal weight loss: Secondary | ICD-10-CM | POA: Diagnosis not present

## 2018-01-06 DIAGNOSIS — I509 Heart failure, unspecified: Secondary | ICD-10-CM | POA: Diagnosis not present

## 2018-01-06 DIAGNOSIS — R609 Edema, unspecified: Secondary | ICD-10-CM | POA: Diagnosis not present

## 2018-01-06 DIAGNOSIS — R634 Abnormal weight loss: Secondary | ICD-10-CM | POA: Diagnosis not present

## 2018-01-06 DIAGNOSIS — L03119 Cellulitis of unspecified part of limb: Secondary | ICD-10-CM | POA: Diagnosis not present

## 2018-01-06 DIAGNOSIS — R0902 Hypoxemia: Secondary | ICD-10-CM | POA: Diagnosis not present

## 2018-01-06 DIAGNOSIS — R630 Anorexia: Secondary | ICD-10-CM | POA: Diagnosis not present

## 2018-01-09 DIAGNOSIS — L03119 Cellulitis of unspecified part of limb: Secondary | ICD-10-CM | POA: Diagnosis not present

## 2018-01-09 DIAGNOSIS — I509 Heart failure, unspecified: Secondary | ICD-10-CM | POA: Diagnosis not present

## 2018-01-09 DIAGNOSIS — R630 Anorexia: Secondary | ICD-10-CM | POA: Diagnosis not present

## 2018-01-09 DIAGNOSIS — R634 Abnormal weight loss: Secondary | ICD-10-CM | POA: Diagnosis not present

## 2018-01-09 DIAGNOSIS — R0902 Hypoxemia: Secondary | ICD-10-CM | POA: Diagnosis not present

## 2018-01-09 DIAGNOSIS — R609 Edema, unspecified: Secondary | ICD-10-CM | POA: Diagnosis not present

## 2018-01-10 DIAGNOSIS — L03119 Cellulitis of unspecified part of limb: Secondary | ICD-10-CM | POA: Diagnosis not present

## 2018-01-10 DIAGNOSIS — R0902 Hypoxemia: Secondary | ICD-10-CM | POA: Diagnosis not present

## 2018-01-10 DIAGNOSIS — R630 Anorexia: Secondary | ICD-10-CM | POA: Diagnosis not present

## 2018-01-10 DIAGNOSIS — R634 Abnormal weight loss: Secondary | ICD-10-CM | POA: Diagnosis not present

## 2018-01-10 DIAGNOSIS — R609 Edema, unspecified: Secondary | ICD-10-CM | POA: Diagnosis not present

## 2018-01-10 DIAGNOSIS — I509 Heart failure, unspecified: Secondary | ICD-10-CM | POA: Diagnosis not present

## 2018-01-11 ENCOUNTER — Other Ambulatory Visit: Payer: Self-pay | Admitting: Internal Medicine

## 2018-01-13 DIAGNOSIS — E668 Other obesity: Secondary | ICD-10-CM | POA: Diagnosis not present

## 2018-01-13 DIAGNOSIS — I10 Essential (primary) hypertension: Secondary | ICD-10-CM | POA: Diagnosis not present

## 2018-01-14 ENCOUNTER — Encounter: Payer: Self-pay | Admitting: Family

## 2018-01-14 ENCOUNTER — Non-Acute Institutional Stay (SKILLED_NURSING_FACILITY): Payer: Medicare Other | Admitting: Family

## 2018-01-14 DIAGNOSIS — I130 Hypertensive heart and chronic kidney disease with heart failure and stage 1 through stage 4 chronic kidney disease, or unspecified chronic kidney disease: Secondary | ICD-10-CM | POA: Diagnosis not present

## 2018-01-14 DIAGNOSIS — I482 Chronic atrial fibrillation, unspecified: Secondary | ICD-10-CM

## 2018-01-14 DIAGNOSIS — L97929 Non-pressure chronic ulcer of unspecified part of left lower leg with unspecified severity: Secondary | ICD-10-CM | POA: Diagnosis not present

## 2018-01-14 DIAGNOSIS — Z7901 Long term (current) use of anticoagulants: Secondary | ICD-10-CM

## 2018-01-14 DIAGNOSIS — I509 Heart failure, unspecified: Secondary | ICD-10-CM | POA: Diagnosis not present

## 2018-01-14 DIAGNOSIS — N183 Chronic kidney disease, stage 3 (moderate): Secondary | ICD-10-CM | POA: Diagnosis not present

## 2018-01-14 DIAGNOSIS — L03119 Cellulitis of unspecified part of limb: Secondary | ICD-10-CM | POA: Diagnosis not present

## 2018-01-14 DIAGNOSIS — I5032 Chronic diastolic (congestive) heart failure: Secondary | ICD-10-CM | POA: Diagnosis not present

## 2018-01-14 DIAGNOSIS — I83029 Varicose veins of left lower extremity with ulcer of unspecified site: Secondary | ICD-10-CM

## 2018-01-14 DIAGNOSIS — K219 Gastro-esophageal reflux disease without esophagitis: Secondary | ICD-10-CM

## 2018-01-14 DIAGNOSIS — R0902 Hypoxemia: Secondary | ICD-10-CM | POA: Diagnosis not present

## 2018-01-14 DIAGNOSIS — R634 Abnormal weight loss: Secondary | ICD-10-CM | POA: Diagnosis not present

## 2018-01-14 DIAGNOSIS — R609 Edema, unspecified: Secondary | ICD-10-CM | POA: Diagnosis not present

## 2018-01-14 DIAGNOSIS — R630 Anorexia: Secondary | ICD-10-CM | POA: Diagnosis not present
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
## 2018-01-15 DIAGNOSIS — I509 Heart failure, unspecified: Secondary | ICD-10-CM | POA: Diagnosis not present

## 2018-01-15 DIAGNOSIS — R609 Edema, unspecified: Secondary | ICD-10-CM | POA: Diagnosis not present

## 2018-01-15 DIAGNOSIS — R0902 Hypoxemia: Secondary | ICD-10-CM | POA: Diagnosis not present

## 2018-01-15 DIAGNOSIS — L03119 Cellulitis of unspecified part of limb: Secondary | ICD-10-CM | POA: Diagnosis not present

## 2018-01-15 DIAGNOSIS — R634 Abnormal weight loss: Secondary | ICD-10-CM | POA: Diagnosis not present

## 2018-01-15 DIAGNOSIS — R630 Anorexia: Secondary | ICD-10-CM | POA: Diagnosis not present

## 2018-01-16 DIAGNOSIS — I509 Heart failure, unspecified: Secondary | ICD-10-CM | POA: Diagnosis not present

## 2018-01-16 DIAGNOSIS — R0902 Hypoxemia: Secondary | ICD-10-CM | POA: Diagnosis not present

## 2018-01-16 DIAGNOSIS — R634 Abnormal weight loss: Secondary | ICD-10-CM | POA: Diagnosis not present

## 2018-01-16 DIAGNOSIS — L03119 Cellulitis of unspecified part of limb: Secondary | ICD-10-CM | POA: Diagnosis not present

## 2018-01-16 DIAGNOSIS — R609 Edema, unspecified: Secondary | ICD-10-CM | POA: Diagnosis not present

## 2018-01-16 DIAGNOSIS — R630 Anorexia: Secondary | ICD-10-CM | POA: Diagnosis not present

## 2018-01-21 DIAGNOSIS — R630 Anorexia: Secondary | ICD-10-CM | POA: Diagnosis not present

## 2018-01-21 DIAGNOSIS — R634 Abnormal weight loss: Secondary | ICD-10-CM | POA: Diagnosis not present

## 2018-01-21 DIAGNOSIS — I509 Heart failure, unspecified: Secondary | ICD-10-CM | POA: Diagnosis not present

## 2018-01-21 DIAGNOSIS — L03119 Cellulitis of unspecified part of limb: Secondary | ICD-10-CM | POA: Diagnosis not present

## 2018-01-21 DIAGNOSIS — R609 Edema, unspecified: Secondary | ICD-10-CM | POA: Diagnosis not present

## 2018-01-21 DIAGNOSIS — R0902 Hypoxemia: Secondary | ICD-10-CM | POA: Diagnosis not present

## 2018-01-22 ENCOUNTER — Encounter: Payer: Self-pay | Admitting: Family

## 2018-01-22 ENCOUNTER — Non-Acute Institutional Stay (SKILLED_NURSING_FACILITY): Payer: Medicare Other | Admitting: Family

## 2018-01-22 DIAGNOSIS — R634 Abnormal weight loss: Secondary | ICD-10-CM | POA: Diagnosis not present

## 2018-01-22 DIAGNOSIS — R197 Diarrhea, unspecified: Secondary | ICD-10-CM | POA: Diagnosis not present

## 2018-01-22 DIAGNOSIS — R1110 Vomiting, unspecified: Secondary | ICD-10-CM | POA: Diagnosis not present

## 2018-01-22 DIAGNOSIS — R609 Edema, unspecified: Secondary | ICD-10-CM | POA: Diagnosis not present

## 2018-01-22 DIAGNOSIS — F411 Generalized anxiety disorder: Secondary | ICD-10-CM

## 2018-01-22 DIAGNOSIS — R630 Anorexia: Secondary | ICD-10-CM | POA: Diagnosis not present

## 2018-01-22 DIAGNOSIS — R109 Unspecified abdominal pain: Secondary | ICD-10-CM

## 2018-01-22 DIAGNOSIS — I509 Heart failure, unspecified: Secondary | ICD-10-CM | POA: Diagnosis not present

## 2018-01-22 DIAGNOSIS — R0902 Hypoxemia: Secondary | ICD-10-CM | POA: Diagnosis not present

## 2018-01-22 DIAGNOSIS — L03119 Cellulitis of unspecified part of limb: Secondary | ICD-10-CM | POA: Diagnosis not present

## 2018-01-23 DIAGNOSIS — I509 Heart failure, unspecified: Secondary | ICD-10-CM | POA: Diagnosis not present

## 2018-01-23 DIAGNOSIS — R0902 Hypoxemia: Secondary | ICD-10-CM | POA: Diagnosis not present

## 2018-01-23 DIAGNOSIS — R634 Abnormal weight loss: Secondary | ICD-10-CM | POA: Diagnosis not present

## 2018-01-23 DIAGNOSIS — R630 Anorexia: Secondary | ICD-10-CM | POA: Diagnosis not present

## 2018-01-23 DIAGNOSIS — R609 Edema, unspecified: Secondary | ICD-10-CM | POA: Diagnosis not present

## 2018-01-23 DIAGNOSIS — L03119 Cellulitis of unspecified part of limb: Secondary | ICD-10-CM | POA: Diagnosis not present

## 2018-01-24 ENCOUNTER — Encounter: Payer: Self-pay | Admitting: Family Medicine

## 2018-01-24 DIAGNOSIS — R634 Abnormal weight loss: Secondary | ICD-10-CM | POA: Diagnosis not present

## 2018-01-24 DIAGNOSIS — R630 Anorexia: Secondary | ICD-10-CM | POA: Diagnosis not present

## 2018-01-24 DIAGNOSIS — R0902 Hypoxemia: Secondary | ICD-10-CM | POA: Diagnosis not present

## 2018-01-24 DIAGNOSIS — I509 Heart failure, unspecified: Secondary | ICD-10-CM | POA: Diagnosis not present

## 2018-01-24 DIAGNOSIS — L03119 Cellulitis of unspecified part of limb: Secondary | ICD-10-CM | POA: Diagnosis not present

## 2018-01-24 DIAGNOSIS — R609 Edema, unspecified: Secondary | ICD-10-CM | POA: Diagnosis not present

## 2018-01-28 ENCOUNTER — Non-Acute Institutional Stay (SKILLED_NURSING_FACILITY): Payer: Medicare Other | Admitting: Family

## 2018-01-28 ENCOUNTER — Other Ambulatory Visit: Payer: Self-pay | Admitting: *Deleted

## 2018-01-28 ENCOUNTER — Encounter: Payer: Self-pay | Admitting: Family

## 2018-01-28 DIAGNOSIS — R609 Edema, unspecified: Secondary | ICD-10-CM | POA: Diagnosis not present

## 2018-01-28 DIAGNOSIS — R634 Abnormal weight loss: Secondary | ICD-10-CM | POA: Diagnosis not present

## 2018-01-28 DIAGNOSIS — I509 Heart failure, unspecified: Secondary | ICD-10-CM | POA: Diagnosis not present

## 2018-01-28 DIAGNOSIS — L97929 Non-pressure chronic ulcer of unspecified part of left lower leg with unspecified severity: Secondary | ICD-10-CM

## 2018-01-28 DIAGNOSIS — M79605 Pain in left leg: Secondary | ICD-10-CM

## 2018-01-28 DIAGNOSIS — L03119 Cellulitis of unspecified part of limb: Secondary | ICD-10-CM | POA: Diagnosis not present

## 2018-01-28 DIAGNOSIS — R531 Weakness: Secondary | ICD-10-CM | POA: Diagnosis not present

## 2018-01-28 DIAGNOSIS — L03116 Cellulitis of left lower limb: Secondary | ICD-10-CM | POA: Diagnosis not present

## 2018-01-28 DIAGNOSIS — R630 Anorexia: Secondary | ICD-10-CM | POA: Diagnosis not present

## 2018-01-28 DIAGNOSIS — R0902 Hypoxemia: Secondary | ICD-10-CM | POA: Diagnosis not present

## 2018-01-28 MED ORDER — MORPHINE SULFATE (CONCENTRATE) 20 MG/ML PO SOLN: 5.0000 mg | Freq: Three times a day (TID) | ORAL | 0 refills | Status: DC
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
## 2018-01-29 DIAGNOSIS — I509 Heart failure, unspecified: Secondary | ICD-10-CM | POA: Diagnosis not present

## 2018-01-29 DIAGNOSIS — R609 Edema, unspecified: Secondary | ICD-10-CM | POA: Diagnosis not present

## 2018-01-29 DIAGNOSIS — R634 Abnormal weight loss: Secondary | ICD-10-CM | POA: Diagnosis not present

## 2018-01-29 DIAGNOSIS — R630 Anorexia: Secondary | ICD-10-CM | POA: Diagnosis not present

## 2018-01-29 DIAGNOSIS — L03119 Cellulitis of unspecified part of limb: Secondary | ICD-10-CM | POA: Diagnosis not present

## 2018-01-29 DIAGNOSIS — R0902 Hypoxemia: Secondary | ICD-10-CM | POA: Diagnosis not present

## 2018-01-30 ENCOUNTER — Encounter: Payer: Self-pay | Admitting: Family

## 2018-01-30 ENCOUNTER — Non-Acute Institutional Stay (SKILLED_NURSING_FACILITY): Payer: Medicare Other | Admitting: Family

## 2018-01-30 DIAGNOSIS — R609 Edema, unspecified: Secondary | ICD-10-CM | POA: Diagnosis not present

## 2018-01-30 DIAGNOSIS — R634 Abnormal weight loss: Secondary | ICD-10-CM | POA: Diagnosis not present

## 2018-01-30 DIAGNOSIS — R0902 Hypoxemia: Secondary | ICD-10-CM | POA: Diagnosis not present

## 2018-01-30 DIAGNOSIS — L509 Urticaria, unspecified: Secondary | ICD-10-CM

## 2018-01-30 DIAGNOSIS — L03119 Cellulitis of unspecified part of limb: Secondary | ICD-10-CM | POA: Diagnosis not present

## 2018-01-30 DIAGNOSIS — I509 Heart failure, unspecified: Secondary | ICD-10-CM | POA: Diagnosis not present

## 2018-01-30 DIAGNOSIS — R630 Anorexia: Secondary | ICD-10-CM | POA: Diagnosis not present
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
## 2018-01-31 DIAGNOSIS — L03119 Cellulitis of unspecified part of limb: Secondary | ICD-10-CM | POA: Diagnosis not present

## 2018-01-31 DIAGNOSIS — K219 Gastro-esophageal reflux disease without esophagitis: Secondary | ICD-10-CM | POA: Diagnosis not present

## 2018-01-31 DIAGNOSIS — N183 Chronic kidney disease, stage 3 (moderate): Secondary | ICD-10-CM | POA: Diagnosis not present

## 2018-01-31 DIAGNOSIS — R609 Edema, unspecified: Secondary | ICD-10-CM | POA: Diagnosis not present

## 2018-01-31 DIAGNOSIS — R630 Anorexia: Secondary | ICD-10-CM | POA: Diagnosis not present

## 2018-01-31 DIAGNOSIS — I272 Pulmonary hypertension, unspecified: Secondary | ICD-10-CM | POA: Diagnosis not present

## 2018-01-31 DIAGNOSIS — I4891 Unspecified atrial fibrillation: Secondary | ICD-10-CM | POA: Diagnosis not present

## 2018-01-31 DIAGNOSIS — I509 Heart failure, unspecified: Secondary | ICD-10-CM | POA: Diagnosis not present

## 2018-01-31 DIAGNOSIS — R0902 Hypoxemia: Secondary | ICD-10-CM | POA: Diagnosis not present

## 2018-01-31 DIAGNOSIS — I2510 Atherosclerotic heart disease of native coronary artery without angina pectoris: Secondary | ICD-10-CM | POA: Diagnosis not present

## 2018-01-31 DIAGNOSIS — M109 Gout, unspecified: Secondary | ICD-10-CM | POA: Diagnosis not present

## 2018-01-31 DIAGNOSIS — F0280 Dementia in other diseases classified elsewhere without behavioral disturbance: Secondary | ICD-10-CM | POA: Diagnosis not present

## 2018-01-31 DIAGNOSIS — D376 Neoplasm of uncertain behavior of liver, gallbladder and bile ducts: Secondary | ICD-10-CM | POA: Diagnosis not present

## 2018-01-31 DIAGNOSIS — R634 Abnormal weight loss: Secondary | ICD-10-CM | POA: Diagnosis not present

## 2018-02-03 ENCOUNTER — Encounter: Payer: Self-pay | Admitting: Internal Medicine

## 2018-02-03 ENCOUNTER — Non-Acute Institutional Stay (SKILLED_NURSING_FACILITY): Payer: Medicare Other | Admitting: Internal Medicine

## 2018-02-03 DIAGNOSIS — I83028 Varicose veins of left lower extremity with ulcer other part of lower leg: Secondary | ICD-10-CM

## 2018-02-03 DIAGNOSIS — R600 Localized edema: Secondary | ICD-10-CM | POA: Diagnosis not present

## 2018-02-03 DIAGNOSIS — R634 Abnormal weight loss: Secondary | ICD-10-CM | POA: Diagnosis not present

## 2018-02-03 DIAGNOSIS — L97828 Non-pressure chronic ulcer of other part of left lower leg with other specified severity: Secondary | ICD-10-CM

## 2018-02-03 DIAGNOSIS — L03119 Cellulitis of unspecified part of limb: Secondary | ICD-10-CM | POA: Diagnosis not present

## 2018-02-03 DIAGNOSIS — R630 Anorexia: Secondary | ICD-10-CM | POA: Diagnosis not present

## 2018-02-03 DIAGNOSIS — I509 Heart failure, unspecified: Secondary | ICD-10-CM | POA: Diagnosis not present

## 2018-02-03 DIAGNOSIS — R0902 Hypoxemia: Secondary | ICD-10-CM | POA: Diagnosis not present

## 2018-02-03 DIAGNOSIS — R609 Edema, unspecified: Secondary | ICD-10-CM | POA: Diagnosis not present
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
## 2018-02-04 DIAGNOSIS — L03119 Cellulitis of unspecified part of limb: Secondary | ICD-10-CM | POA: Diagnosis not present

## 2018-02-04 DIAGNOSIS — R0902 Hypoxemia: Secondary | ICD-10-CM | POA: Diagnosis not present

## 2018-02-04 DIAGNOSIS — I509 Heart failure, unspecified: Secondary | ICD-10-CM | POA: Diagnosis not present

## 2018-02-04 DIAGNOSIS — R630 Anorexia: Secondary | ICD-10-CM | POA: Diagnosis not present

## 2018-02-04 DIAGNOSIS — R634 Abnormal weight loss: Secondary | ICD-10-CM | POA: Diagnosis not present

## 2018-02-04 DIAGNOSIS — R609 Edema, unspecified: Secondary | ICD-10-CM | POA: Diagnosis not present

## 2018-02-05 ENCOUNTER — Non-Acute Institutional Stay (SKILLED_NURSING_FACILITY): Payer: Medicare Other | Admitting: Family

## 2018-02-05 DIAGNOSIS — M79605 Pain in left leg: Secondary | ICD-10-CM | POA: Diagnosis not present

## 2018-02-05 DIAGNOSIS — R410 Disorientation, unspecified: Secondary | ICD-10-CM
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
## 2018-02-06 DIAGNOSIS — R609 Edema, unspecified: Secondary | ICD-10-CM | POA: Diagnosis not present

## 2018-02-06 DIAGNOSIS — I509 Heart failure, unspecified: Secondary | ICD-10-CM | POA: Diagnosis not present

## 2018-02-06 DIAGNOSIS — R0902 Hypoxemia: Secondary | ICD-10-CM | POA: Diagnosis not present

## 2018-02-06 DIAGNOSIS — R634 Abnormal weight loss: Secondary | ICD-10-CM | POA: Diagnosis not present

## 2018-02-06 DIAGNOSIS — L03119 Cellulitis of unspecified part of limb: Secondary | ICD-10-CM | POA: Diagnosis not present

## 2018-02-06 DIAGNOSIS — R630 Anorexia: Secondary | ICD-10-CM | POA: Diagnosis not present

## 2018-02-07 DIAGNOSIS — I509 Heart failure, unspecified: Secondary | ICD-10-CM | POA: Diagnosis not present

## 2018-02-07 DIAGNOSIS — R634 Abnormal weight loss: Secondary | ICD-10-CM | POA: Diagnosis not present

## 2018-02-07 DIAGNOSIS — R609 Edema, unspecified: Secondary | ICD-10-CM | POA: Diagnosis not present

## 2018-02-07 DIAGNOSIS — R630 Anorexia: Secondary | ICD-10-CM | POA: Diagnosis not present

## 2018-02-07 DIAGNOSIS — L03119 Cellulitis of unspecified part of limb: Secondary | ICD-10-CM | POA: Diagnosis not present

## 2018-02-07 DIAGNOSIS — R0902 Hypoxemia: Secondary | ICD-10-CM | POA: Diagnosis not present

## 2018-02-10 ENCOUNTER — Encounter: Payer: Self-pay | Admitting: Internal Medicine

## 2018-02-10 ENCOUNTER — Non-Acute Institutional Stay (SKILLED_NURSING_FACILITY): Payer: Medicare Other | Admitting: Internal Medicine

## 2018-02-10 DIAGNOSIS — I482 Chronic atrial fibrillation, unspecified: Secondary | ICD-10-CM

## 2018-02-10 DIAGNOSIS — R410 Disorientation, unspecified: Secondary | ICD-10-CM

## 2018-02-10 DIAGNOSIS — I5032 Chronic diastolic (congestive) heart failure: Secondary | ICD-10-CM | POA: Diagnosis not present

## 2018-02-10 DIAGNOSIS — L97929 Non-pressure chronic ulcer of unspecified part of left lower leg with unspecified severity: Secondary | ICD-10-CM | POA: Diagnosis not present

## 2018-02-10 DIAGNOSIS — F99 Mental disorder, not otherwise specified: Secondary | ICD-10-CM | POA: Diagnosis not present

## 2018-02-10 DIAGNOSIS — F411 Generalized anxiety disorder: Secondary | ICD-10-CM | POA: Diagnosis not present

## 2018-02-10 DIAGNOSIS — I959 Hypotension, unspecified: Secondary | ICD-10-CM | POA: Diagnosis not present
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
## 2018-02-11 ENCOUNTER — Telehealth: Payer: Self-pay | Admitting: Family Medicine

## 2018-02-11 DIAGNOSIS — I509 Heart failure, unspecified: Secondary | ICD-10-CM | POA: Diagnosis not present

## 2018-02-11 DIAGNOSIS — R634 Abnormal weight loss: Secondary | ICD-10-CM | POA: Diagnosis not present

## 2018-02-11 DIAGNOSIS — R609 Edema, unspecified: Secondary | ICD-10-CM | POA: Diagnosis not present

## 2018-02-11 DIAGNOSIS — R630 Anorexia: Secondary | ICD-10-CM | POA: Diagnosis not present

## 2018-02-11 DIAGNOSIS — R0902 Hypoxemia: Secondary | ICD-10-CM | POA: Diagnosis not present

## 2018-02-11 DIAGNOSIS — L03119 Cellulitis of unspecified part of limb: Secondary | ICD-10-CM | POA: Diagnosis not present

## 2018-02-11 NOTE — Telephone Encounter (Signed)
Please advise.  Copied from [place] [phone]. Topic: General - Other >> Feb 11, 2018  8:53 AM [name], NT wrote: Reason for CRM: Patient being seen by hospice ,the daughter called  she has requested her mom is removed from the the care of the practice ,and if there are any questions [phone]

## 2018-02-12 ENCOUNTER — Encounter: Payer: Self-pay | Admitting: Family Medicine

## 2018-02-12 DIAGNOSIS — L03119 Cellulitis of unspecified part of limb: Secondary | ICD-10-CM | POA: Diagnosis not present

## 2018-02-12 DIAGNOSIS — R634 Abnormal weight loss: Secondary | ICD-10-CM | POA: Diagnosis not present

## 2018-02-12 DIAGNOSIS — R0902 Hypoxemia: Secondary | ICD-10-CM | POA: Diagnosis not present

## 2018-02-12 DIAGNOSIS — R609 Edema, unspecified: Secondary | ICD-10-CM | POA: Diagnosis not present

## 2018-02-12 DIAGNOSIS — R630 Anorexia: Secondary | ICD-10-CM | POA: Diagnosis not present

## 2018-02-12 DIAGNOSIS — I509 Heart failure, unspecified: Secondary | ICD-10-CM | POA: Diagnosis not present

## 2018-02-12 NOTE — Telephone Encounter (Signed)
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   We will miss you but I know you are in the hands of a great care team, [name] "

## 2018-02-14 DIAGNOSIS — R634 Abnormal weight loss: Secondary | ICD-10-CM | POA: Diagnosis not present

## 2018-02-14 DIAGNOSIS — R609 Edema, unspecified: Secondary | ICD-10-CM | POA: Diagnosis not present

## 2018-02-14 DIAGNOSIS — R0902 Hypoxemia: Secondary | ICD-10-CM | POA: Diagnosis not present

## 2018-02-14 DIAGNOSIS — I509 Heart failure, unspecified: Secondary | ICD-10-CM | POA: Diagnosis not present

## 2018-02-14 DIAGNOSIS — R630 Anorexia: Secondary | ICD-10-CM | POA: Diagnosis not present

## 2018-02-14 DIAGNOSIS — L03119 Cellulitis of unspecified part of limb: Secondary | ICD-10-CM | POA: Diagnosis not present

## 2018-02-18 ENCOUNTER — Encounter: Payer: Self-pay | Admitting: Family

## 2018-02-18 ENCOUNTER — Non-Acute Institutional Stay (SKILLED_NURSING_FACILITY): Payer: Medicare Other | Admitting: Family

## 2018-02-18 DIAGNOSIS — R609 Edema, unspecified: Secondary | ICD-10-CM | POA: Diagnosis not present

## 2018-02-18 DIAGNOSIS — R0902 Hypoxemia: Secondary | ICD-10-CM | POA: Diagnosis not present

## 2018-02-18 DIAGNOSIS — G4700 Insomnia, unspecified: Secondary | ICD-10-CM

## 2018-02-18 DIAGNOSIS — I509 Heart failure, unspecified: Secondary | ICD-10-CM | POA: Diagnosis not present

## 2018-02-18 DIAGNOSIS — F411 Generalized anxiety disorder: Secondary | ICD-10-CM

## 2018-02-18 DIAGNOSIS — L03119 Cellulitis of unspecified part of limb: Secondary | ICD-10-CM | POA: Diagnosis not present

## 2018-02-18 DIAGNOSIS — R630 Anorexia: Secondary | ICD-10-CM | POA: Diagnosis not present

## 2018-02-18 DIAGNOSIS — R634 Abnormal weight loss: Secondary | ICD-10-CM | POA: Diagnosis not present
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
## 2018-02-20 DIAGNOSIS — R630 Anorexia: Secondary | ICD-10-CM | POA: Diagnosis not present

## 2018-02-20 DIAGNOSIS — R609 Edema, unspecified: Secondary | ICD-10-CM | POA: Diagnosis not present

## 2018-02-20 DIAGNOSIS — R634 Abnormal weight loss: Secondary | ICD-10-CM | POA: Diagnosis not present

## 2018-02-20 DIAGNOSIS — R0902 Hypoxemia: Secondary | ICD-10-CM | POA: Diagnosis not present

## 2018-02-20 DIAGNOSIS — L03119 Cellulitis of unspecified part of limb: Secondary | ICD-10-CM | POA: Diagnosis not present

## 2018-02-20 DIAGNOSIS — I509 Heart failure, unspecified: Secondary | ICD-10-CM | POA: Diagnosis not present

## 2018-02-21 DIAGNOSIS — I509 Heart failure, unspecified: Secondary | ICD-10-CM | POA: Diagnosis not present

## 2018-02-21 DIAGNOSIS — R0902 Hypoxemia: Secondary | ICD-10-CM | POA: Diagnosis not present

## 2018-02-21 DIAGNOSIS — R630 Anorexia: Secondary | ICD-10-CM | POA: Diagnosis not present

## 2018-02-21 DIAGNOSIS — L03119 Cellulitis of unspecified part of limb: Secondary | ICD-10-CM | POA: Diagnosis not present

## 2018-02-21 DIAGNOSIS — R609 Edema, unspecified: Secondary | ICD-10-CM | POA: Diagnosis not present

## 2018-02-21 DIAGNOSIS — R634 Abnormal weight loss: Secondary | ICD-10-CM | POA: Diagnosis not present

## 2018-02-25 DIAGNOSIS — I509 Heart failure, unspecified: Secondary | ICD-10-CM | POA: Diagnosis not present

## 2018-02-25 DIAGNOSIS — R0902 Hypoxemia: Secondary | ICD-10-CM | POA: Diagnosis not present

## 2018-02-25 DIAGNOSIS — R609 Edema, unspecified: Secondary | ICD-10-CM | POA: Diagnosis not present

## 2018-02-25 DIAGNOSIS — R630 Anorexia: Secondary | ICD-10-CM | POA: Diagnosis not present

## 2018-02-25 DIAGNOSIS — L03119 Cellulitis of unspecified part of limb: Secondary | ICD-10-CM | POA: Diagnosis not present

## 2018-02-25 DIAGNOSIS — R634 Abnormal weight loss: Secondary | ICD-10-CM | POA: Diagnosis not present

## 2018-02-26 DIAGNOSIS — L03119 Cellulitis of unspecified part of limb: Secondary | ICD-10-CM | POA: Diagnosis not present

## 2018-02-26 DIAGNOSIS — R609 Edema, unspecified: Secondary | ICD-10-CM | POA: Diagnosis not present

## 2018-02-26 DIAGNOSIS — R630 Anorexia: Secondary | ICD-10-CM | POA: Diagnosis not present

## 2018-02-26 DIAGNOSIS — R0902 Hypoxemia: Secondary | ICD-10-CM | POA: Diagnosis not present

## 2018-02-26 DIAGNOSIS — I509 Heart failure, unspecified: Secondary | ICD-10-CM | POA: Diagnosis not present

## 2018-02-26 DIAGNOSIS — R634 Abnormal weight loss: Secondary | ICD-10-CM | POA: Diagnosis not present

## 2018-02-27 DIAGNOSIS — L03119 Cellulitis of unspecified part of limb: Secondary | ICD-10-CM | POA: Diagnosis not present

## 2018-02-27 DIAGNOSIS — R0902 Hypoxemia: Secondary | ICD-10-CM | POA: Diagnosis not present

## 2018-02-27 DIAGNOSIS — R609 Edema, unspecified: Secondary | ICD-10-CM | POA: Diagnosis not present

## 2018-02-27 DIAGNOSIS — I509 Heart failure, unspecified: Secondary | ICD-10-CM | POA: Diagnosis not present

## 2018-02-27 DIAGNOSIS — R634 Abnormal weight loss: Secondary | ICD-10-CM | POA: Diagnosis not present

## 2018-02-27 DIAGNOSIS — R630 Anorexia: Secondary | ICD-10-CM | POA: Diagnosis not present

## 2018-02-28 DIAGNOSIS — I4891 Unspecified atrial fibrillation: Secondary | ICD-10-CM | POA: Diagnosis not present

## 2018-02-28 DIAGNOSIS — I2510 Atherosclerotic heart disease of native coronary artery without angina pectoris: Secondary | ICD-10-CM | POA: Diagnosis not present

## 2018-02-28 DIAGNOSIS — N183 Chronic kidney disease, stage 3 (moderate): Secondary | ICD-10-CM | POA: Diagnosis not present

## 2018-02-28 DIAGNOSIS — R630 Anorexia: Secondary | ICD-10-CM | POA: Diagnosis not present

## 2018-02-28 DIAGNOSIS — I509 Heart failure, unspecified: Secondary | ICD-10-CM | POA: Diagnosis not present

## 2018-02-28 DIAGNOSIS — I272 Pulmonary hypertension, unspecified: Secondary | ICD-10-CM | POA: Diagnosis not present

## 2018-02-28 DIAGNOSIS — R0902 Hypoxemia: Secondary | ICD-10-CM | POA: Diagnosis not present

## 2018-02-28 DIAGNOSIS — R609 Edema, unspecified: Secondary | ICD-10-CM | POA: Diagnosis not present

## 2018-02-28 DIAGNOSIS — K219 Gastro-esophageal reflux disease without esophagitis: Secondary | ICD-10-CM | POA: Diagnosis not present

## 2018-02-28 DIAGNOSIS — D376 Neoplasm of uncertain behavior of liver, gallbladder and bile ducts: Secondary | ICD-10-CM | POA: Diagnosis not present

## 2018-02-28 DIAGNOSIS — M109 Gout, unspecified: Secondary | ICD-10-CM | POA: Diagnosis not present

## 2018-02-28 DIAGNOSIS — F0280 Dementia in other diseases classified elsewhere without behavioral disturbance: Secondary | ICD-10-CM | POA: Diagnosis not present

## 2018-02-28 DIAGNOSIS — R634 Abnormal weight loss: Secondary | ICD-10-CM | POA: Diagnosis not present

## 2018-02-28 DIAGNOSIS — L03119 Cellulitis of unspecified part of limb: Secondary | ICD-10-CM | POA: Diagnosis not present

## 2018-03-04 DIAGNOSIS — R634 Abnormal weight loss: Secondary | ICD-10-CM | POA: Diagnosis not present

## 2018-03-04 DIAGNOSIS — L03119 Cellulitis of unspecified part of limb: Secondary | ICD-10-CM | POA: Diagnosis not present

## 2018-03-04 DIAGNOSIS — I509 Heart failure, unspecified: Secondary | ICD-10-CM | POA: Diagnosis not present

## 2018-03-04 DIAGNOSIS — R609 Edema, unspecified: Secondary | ICD-10-CM | POA: Diagnosis not present

## 2018-03-04 DIAGNOSIS — R630 Anorexia: Secondary | ICD-10-CM | POA: Diagnosis not present

## 2018-03-04 DIAGNOSIS — R0902 Hypoxemia: Secondary | ICD-10-CM | POA: Diagnosis not present

## 2018-03-06 ENCOUNTER — Encounter: Payer: Self-pay | Admitting: Family

## 2018-03-06 ENCOUNTER — Non-Acute Institutional Stay (SKILLED_NURSING_FACILITY): Payer: Medicare Other | Admitting: Family

## 2018-03-06 DIAGNOSIS — I130 Hypertensive heart and chronic kidney disease with heart failure and stage 1 through stage 4 chronic kidney disease, or unspecified chronic kidney disease: Secondary | ICD-10-CM | POA: Diagnosis not present

## 2018-03-06 DIAGNOSIS — R634 Abnormal weight loss: Secondary | ICD-10-CM | POA: Diagnosis not present

## 2018-03-06 DIAGNOSIS — R609 Edema, unspecified: Secondary | ICD-10-CM

## 2018-03-06 DIAGNOSIS — I482 Chronic atrial fibrillation, unspecified: Secondary | ICD-10-CM

## 2018-03-06 DIAGNOSIS — I509 Heart failure, unspecified: Secondary | ICD-10-CM | POA: Diagnosis not present

## 2018-03-06 DIAGNOSIS — T148XXA Other injury of unspecified body region, initial encounter: Secondary | ICD-10-CM

## 2018-03-06 DIAGNOSIS — L03119 Cellulitis of unspecified part of limb: Secondary | ICD-10-CM | POA: Diagnosis not present

## 2018-03-06 DIAGNOSIS — N183 Chronic kidney disease, stage 3 (moderate): Secondary | ICD-10-CM | POA: Diagnosis not present

## 2018-03-06 DIAGNOSIS — R630 Anorexia: Secondary | ICD-10-CM | POA: Diagnosis not present

## 2018-03-06 DIAGNOSIS — I5032 Chronic diastolic (congestive) heart failure: Secondary | ICD-10-CM

## 2018-03-06 DIAGNOSIS — R0902 Hypoxemia: Secondary | ICD-10-CM | POA: Diagnosis not present

## 2018-03-06 DIAGNOSIS — F411 Generalized anxiety disorder: Secondary | ICD-10-CM
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
## 2018-03-07 DIAGNOSIS — I509 Heart failure, unspecified: Secondary | ICD-10-CM | POA: Diagnosis not present

## 2018-03-07 DIAGNOSIS — R0902 Hypoxemia: Secondary | ICD-10-CM | POA: Diagnosis not present

## 2018-03-07 DIAGNOSIS — R634 Abnormal weight loss: Secondary | ICD-10-CM | POA: Diagnosis not present

## 2018-03-07 DIAGNOSIS — L03119 Cellulitis of unspecified part of limb: Secondary | ICD-10-CM | POA: Diagnosis not present

## 2018-03-07 DIAGNOSIS — R630 Anorexia: Secondary | ICD-10-CM | POA: Diagnosis not present

## 2018-03-07 DIAGNOSIS — R609 Edema, unspecified: Secondary | ICD-10-CM | POA: Diagnosis not present

## 2018-03-10 DIAGNOSIS — R634 Abnormal weight loss: Secondary | ICD-10-CM | POA: Diagnosis not present

## 2018-03-10 DIAGNOSIS — I509 Heart failure, unspecified: Secondary | ICD-10-CM | POA: Diagnosis not present

## 2018-03-10 DIAGNOSIS — R630 Anorexia: Secondary | ICD-10-CM | POA: Diagnosis not present

## 2018-03-10 DIAGNOSIS — R0902 Hypoxemia: Secondary | ICD-10-CM | POA: Diagnosis not present

## 2018-03-10 DIAGNOSIS — L03119 Cellulitis of unspecified part of limb: Secondary | ICD-10-CM | POA: Diagnosis not present

## 2018-03-10 DIAGNOSIS — R609 Edema, unspecified: Secondary | ICD-10-CM | POA: Diagnosis not present

## 2018-03-11 DIAGNOSIS — I509 Heart failure, unspecified: Secondary | ICD-10-CM | POA: Diagnosis not present

## 2018-03-11 DIAGNOSIS — R0902 Hypoxemia: Secondary | ICD-10-CM | POA: Diagnosis not present

## 2018-03-11 DIAGNOSIS — L03119 Cellulitis of unspecified part of limb: Secondary | ICD-10-CM | POA: Diagnosis not present

## 2018-03-11 DIAGNOSIS — R634 Abnormal weight loss: Secondary | ICD-10-CM | POA: Diagnosis not present

## 2018-03-11 DIAGNOSIS — R630 Anorexia: Secondary | ICD-10-CM | POA: Diagnosis not present

## 2018-03-11 DIAGNOSIS — R609 Edema, unspecified: Secondary | ICD-10-CM | POA: Diagnosis not present

## 2018-03-13 ENCOUNTER — Non-Acute Institutional Stay (SKILLED_NURSING_FACILITY): Payer: Medicare Other | Admitting: Family

## 2018-03-13 ENCOUNTER — Encounter: Payer: Self-pay | Admitting: Family

## 2018-03-13 DIAGNOSIS — L03119 Cellulitis of unspecified part of limb: Secondary | ICD-10-CM | POA: Diagnosis not present

## 2018-03-13 DIAGNOSIS — N184 Chronic kidney disease, stage 4 (severe): Secondary | ICD-10-CM

## 2018-03-13 DIAGNOSIS — I959 Hypotension, unspecified: Secondary | ICD-10-CM

## 2018-03-13 DIAGNOSIS — I5032 Chronic diastolic (congestive) heart failure: Secondary | ICD-10-CM

## 2018-03-13 DIAGNOSIS — I10 Essential (primary) hypertension: Secondary | ICD-10-CM | POA: Diagnosis not present

## 2018-03-13 DIAGNOSIS — I509 Heart failure, unspecified: Secondary | ICD-10-CM | POA: Diagnosis not present

## 2018-03-13 DIAGNOSIS — R0902 Hypoxemia: Secondary | ICD-10-CM | POA: Diagnosis not present

## 2018-03-13 DIAGNOSIS — E875 Hyperkalemia: Secondary | ICD-10-CM

## 2018-03-13 DIAGNOSIS — R630 Anorexia: Secondary | ICD-10-CM | POA: Diagnosis not present

## 2018-03-13 DIAGNOSIS — R609 Edema, unspecified: Secondary | ICD-10-CM | POA: Diagnosis not present

## 2018-03-13 DIAGNOSIS — R634 Abnormal weight loss: Secondary | ICD-10-CM | POA: Diagnosis not present

## 2018-03-13 LAB — BASIC METABOLIC PANEL
BUN: 83 — AB (ref 4–21)
CALCIUM: 8.6
Creat: 2.19
Glucose: 85
POTASSIUM: 5.7
Sodium: 131
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
## 2018-03-14 DIAGNOSIS — R0902 Hypoxemia: Secondary | ICD-10-CM | POA: Diagnosis not present

## 2018-03-14 DIAGNOSIS — L03119 Cellulitis of unspecified part of limb: Secondary | ICD-10-CM | POA: Diagnosis not present

## 2018-03-14 DIAGNOSIS — R609 Edema, unspecified: Secondary | ICD-10-CM | POA: Diagnosis not present

## 2018-03-14 DIAGNOSIS — R630 Anorexia: Secondary | ICD-10-CM | POA: Diagnosis not present

## 2018-03-14 DIAGNOSIS — R634 Abnormal weight loss: Secondary | ICD-10-CM | POA: Diagnosis not present

## 2018-03-14 DIAGNOSIS — I509 Heart failure, unspecified: Secondary | ICD-10-CM | POA: Diagnosis not present

## 2018-03-31 DEATH — deceased

## 2018-12-09 IMAGING — CR DG CHEST 2V
2 series · 2 of 2 positions shown · non-contrast
Comparison: 09/18/2017

CLINICAL DATA: Leg swelling history of CHF

EXAM:
CHEST  2 VIEW

[w chest pa]
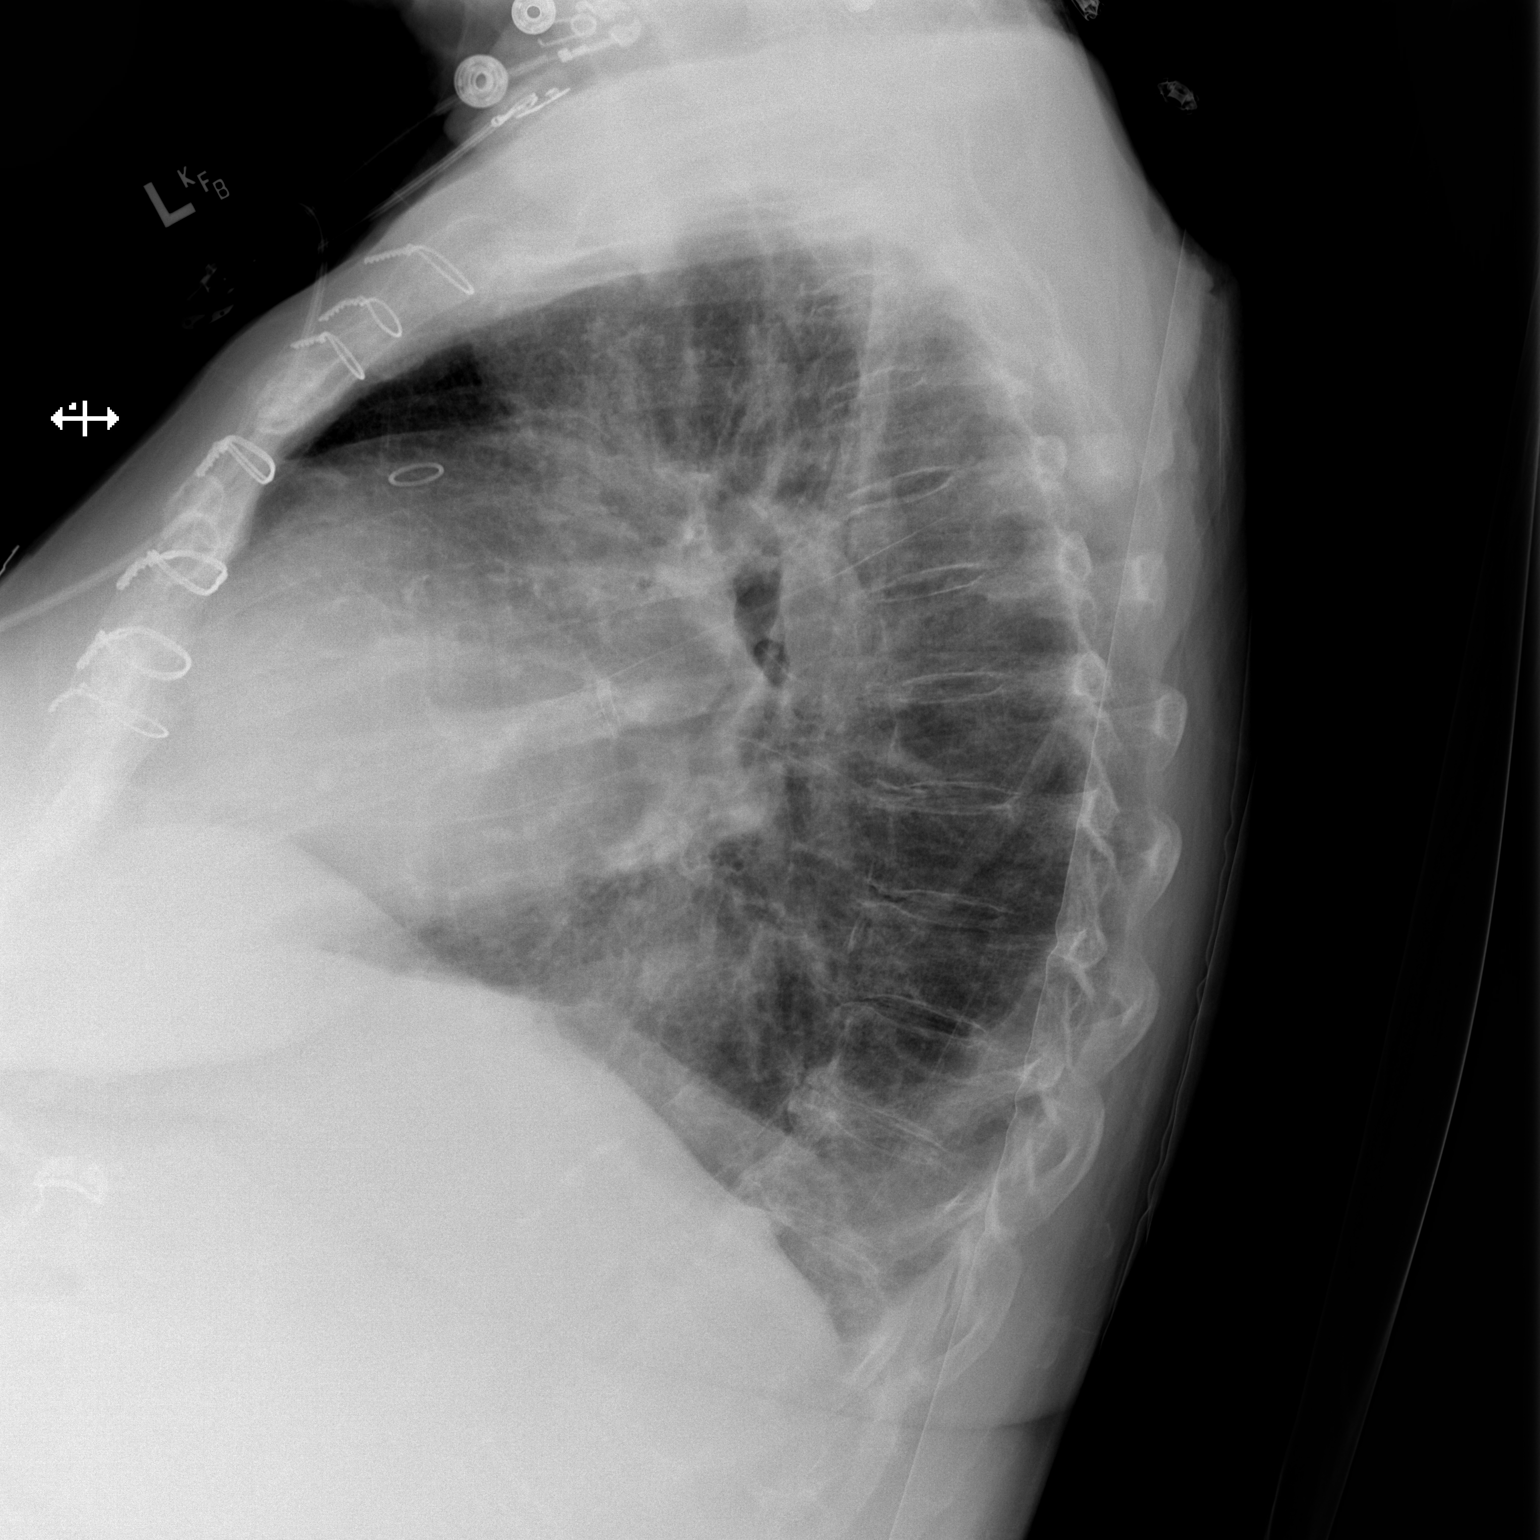

[x chest ap]
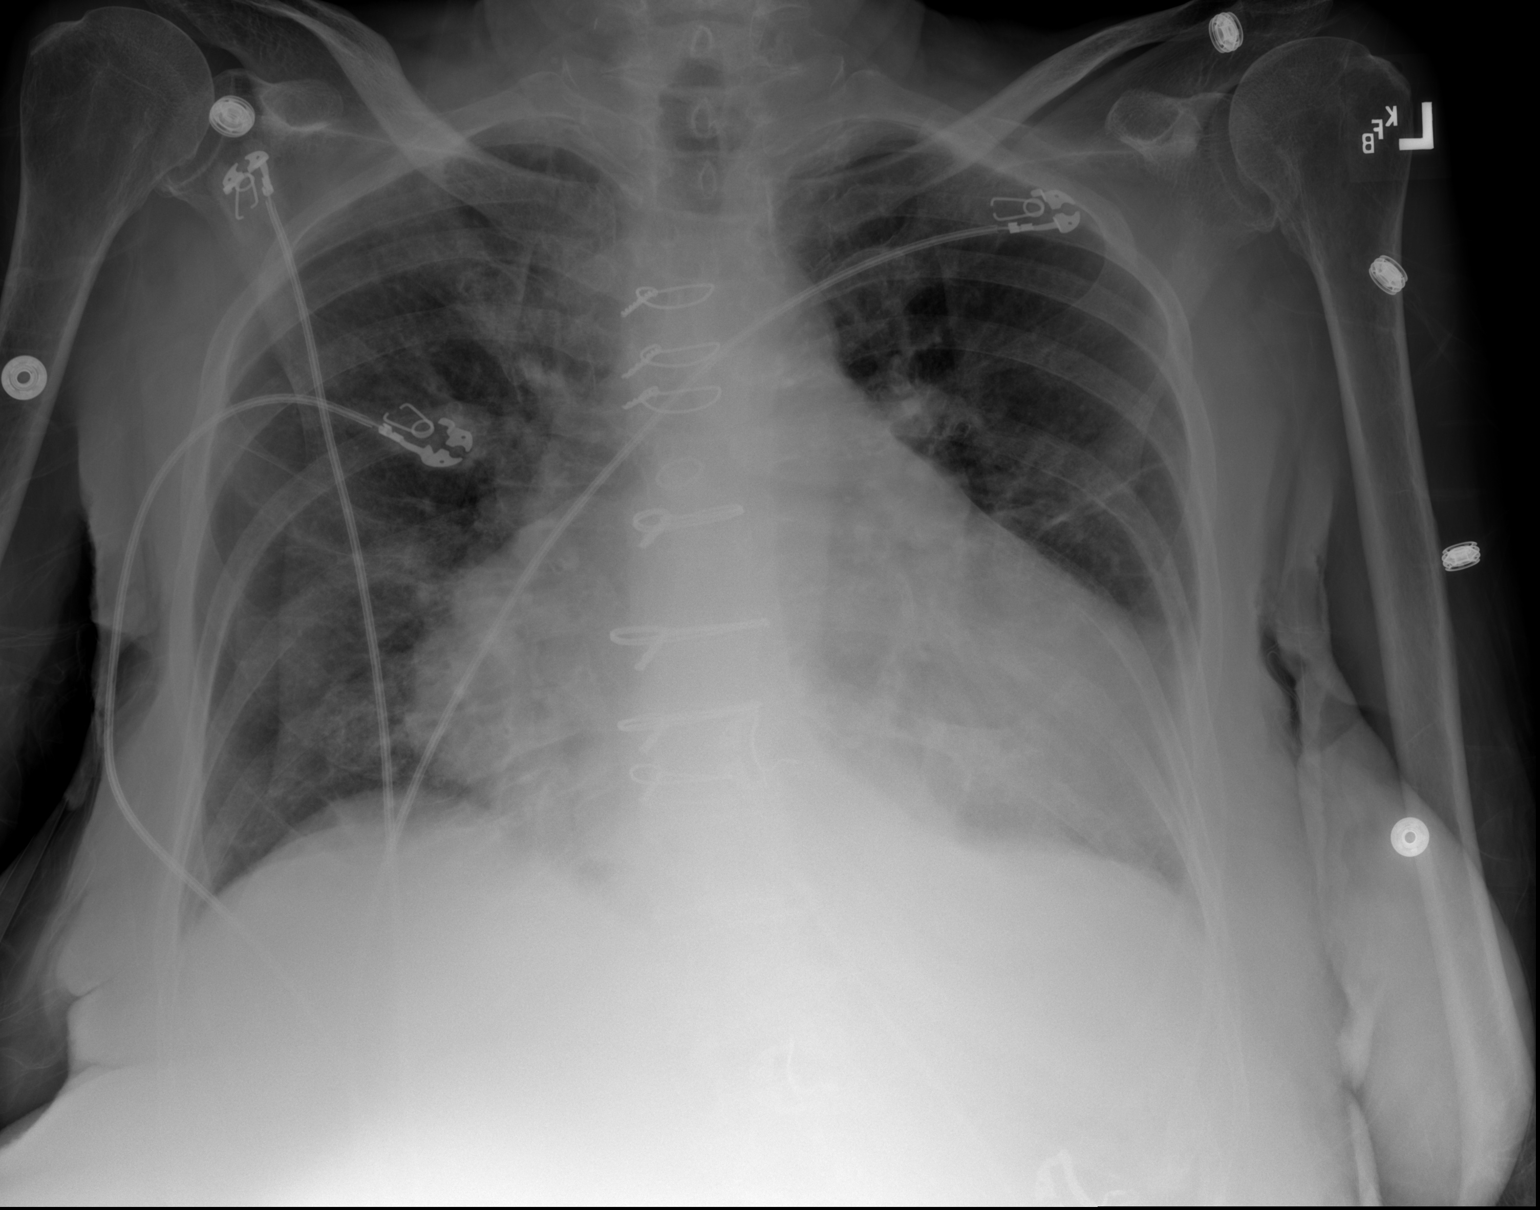

[2 of 2 positions shown; findings below may reference images not displayed]

FINDINGS: Post sternotomy changes. Stable cardiomegaly. Linear scarring or
atelectasis at the mid lung. Stable posterior pleural thickening or
small effusion. Aortic atherosclerosis. No pneumothorax. Suspected
skin fold artifact over the right thorax.
IMPRESSION: 1. Cardiomegaly with minimal central congestion. Stable appearance
of mild posterior pleural thickening or small effusion.

## 2019-01-28 IMAGING — DX DG CHEST 1V PORT
1 series · 1 of 1 positions shown · non-contrast
Comparison: 10/16/2017

CLINICAL DATA: Shortness of Breath

EXAM:
PORTABLE CHEST 1 VIEW

[chest ap]
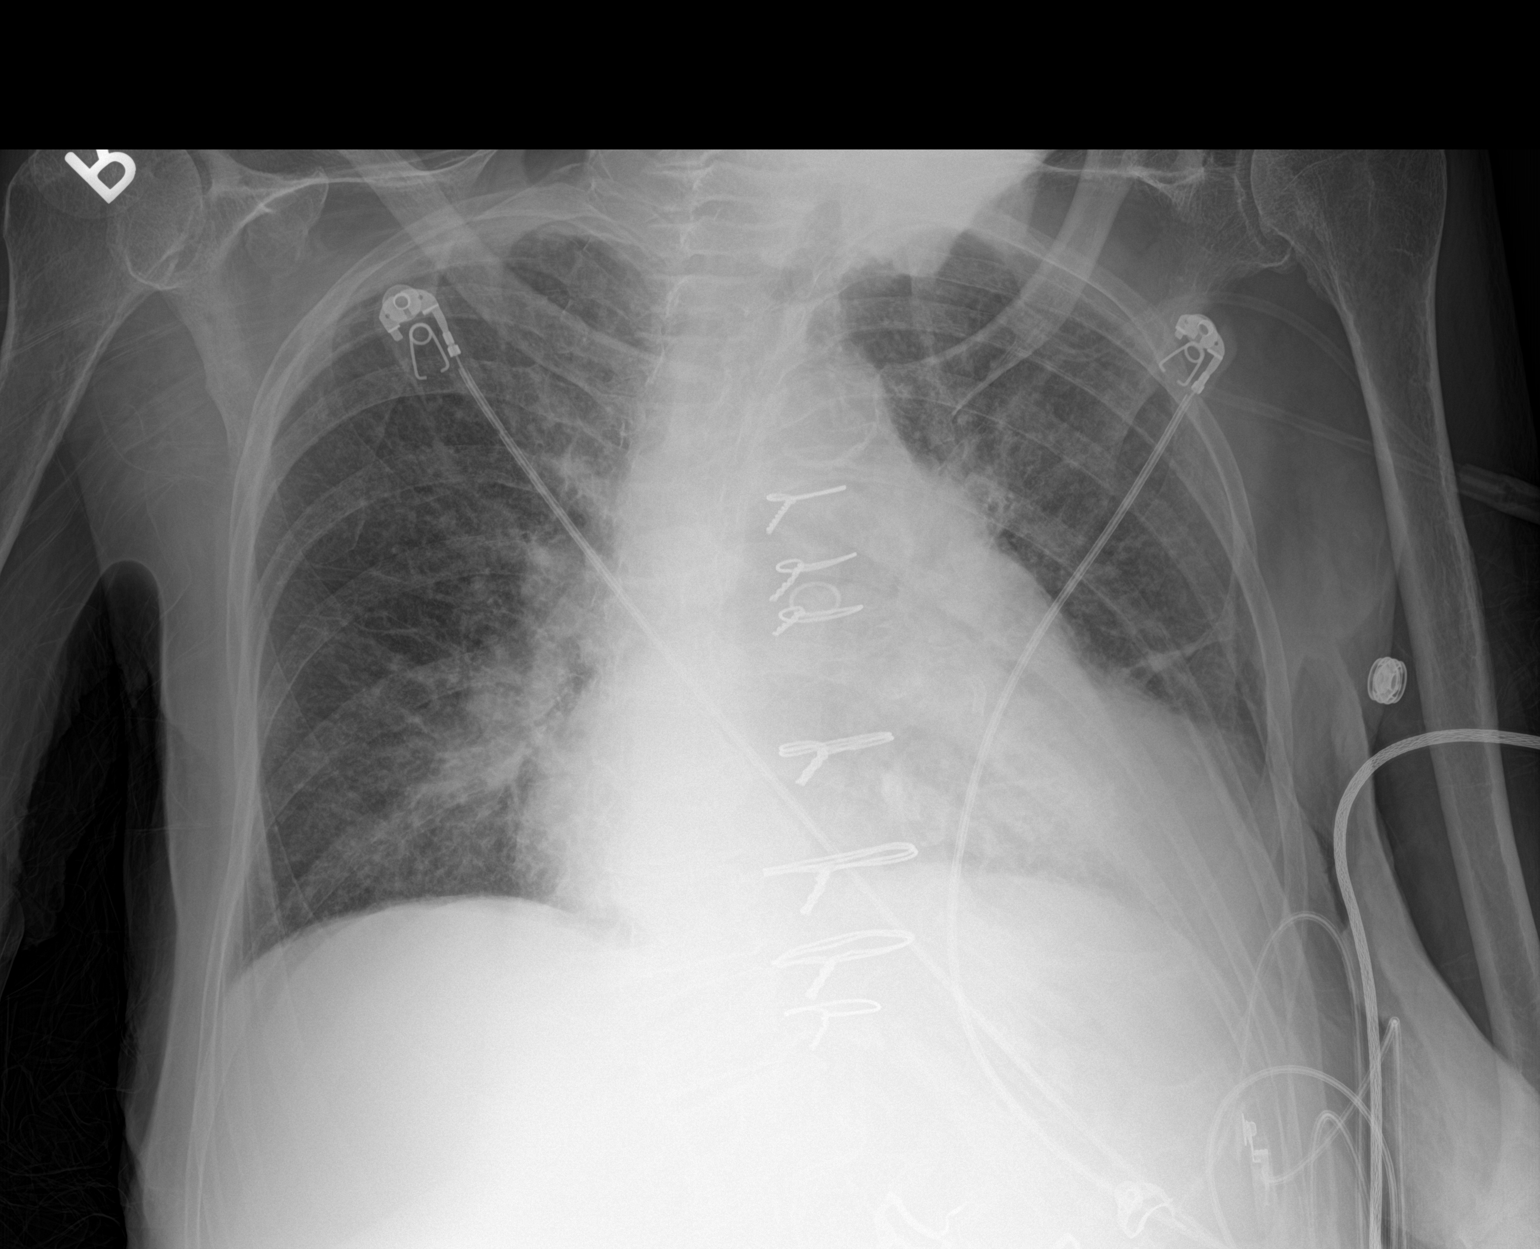

[1 of 1 positions shown; findings below may reference images not displayed]

FINDINGS: Cardiac shadow remains enlarged. Postsurgical changes are again
seen. Central vascular congestion is noted with mild interstitial
edema. No sizable effusion or infiltrate is seen. No bony
abnormality is noted.
IMPRESSION: Mild CHF.

## 2019-01-28 IMAGING — CT CT RENAL STONE PROTOCOL
2 of 4 series · 15 of 46 positions shown, 17 images · non-contrast
Comparison: Body CT 05/02/2011

CLINICAL DATA: Low back pain post fall 3 weeks ago.

EXAM:
CT ABDOMEN AND PELVIS WITHOUT CONTRAST
TECHNIQUE: Multidetector CT imaging of the abdomen and pelvis was performed
following the standard protocol without IV contrast.

[Series 2: axial st · axial · 0.80mm/px · z∈[+588,+983]mm · 12 of 89 slices shown, 14 images]
[im 5/89  soft-tissue]
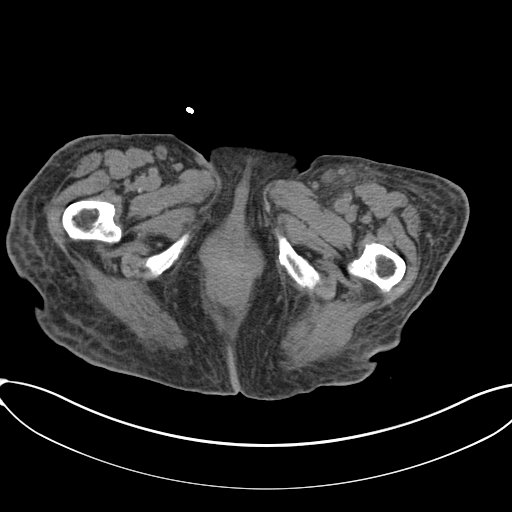
[im 5/89  bone]
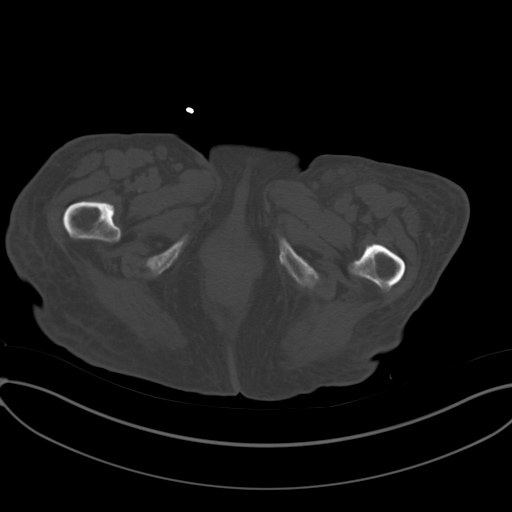
[im 15/89  soft-tissue]
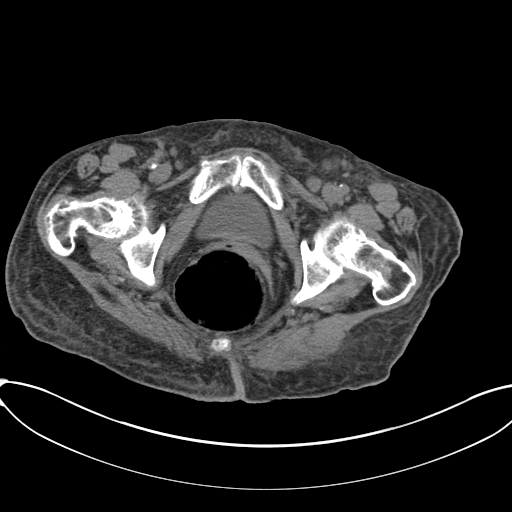
[im 20/89  soft-tissue]
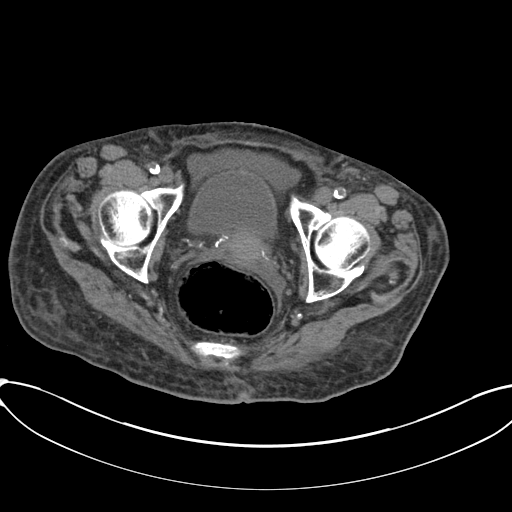
[im 25/89  soft-tissue]
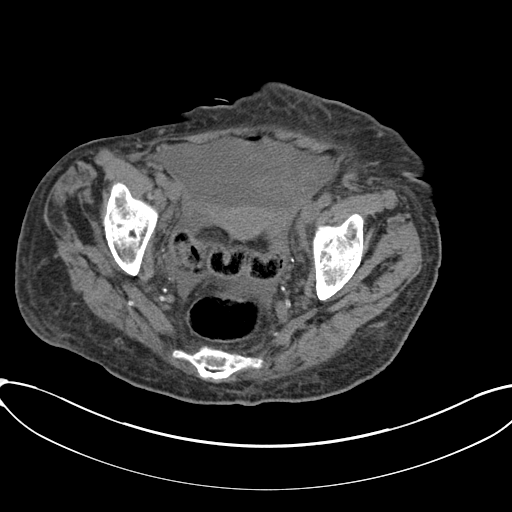
[im 35/89  soft-tissue]
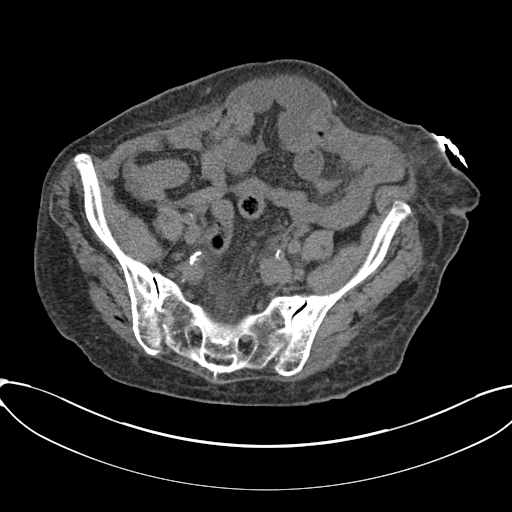
[im 40/89  soft-tissue]
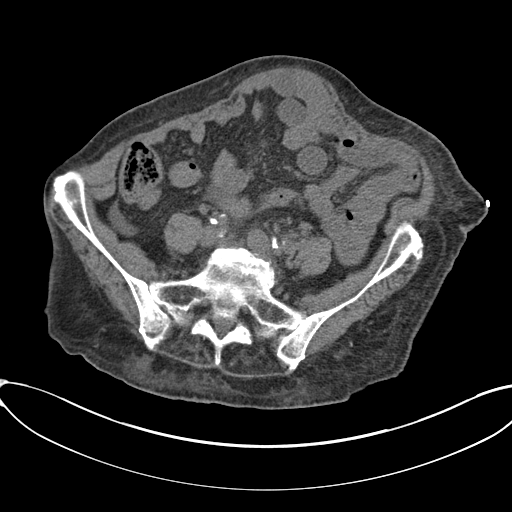
[im 49/89  soft-tissue]
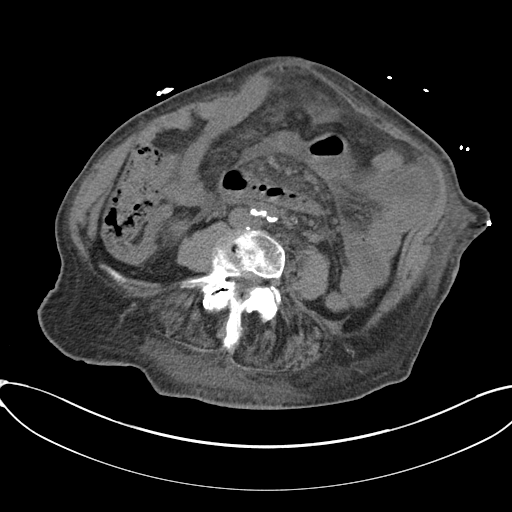
[im 54/89  soft-tissue]
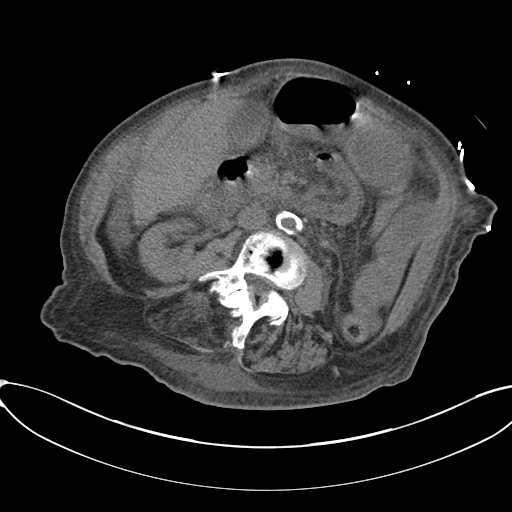
[im 64/89  soft-tissue]
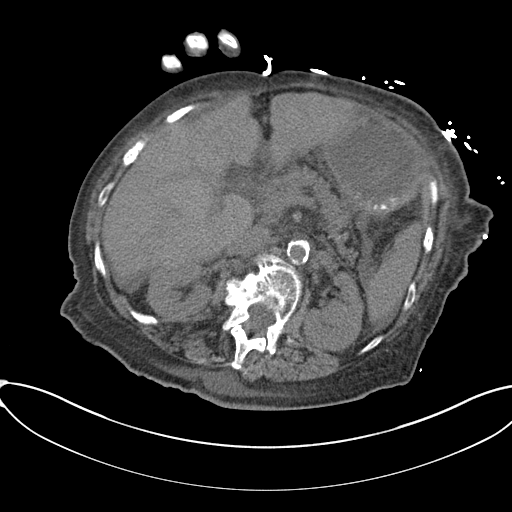
[im 64/89  bone]
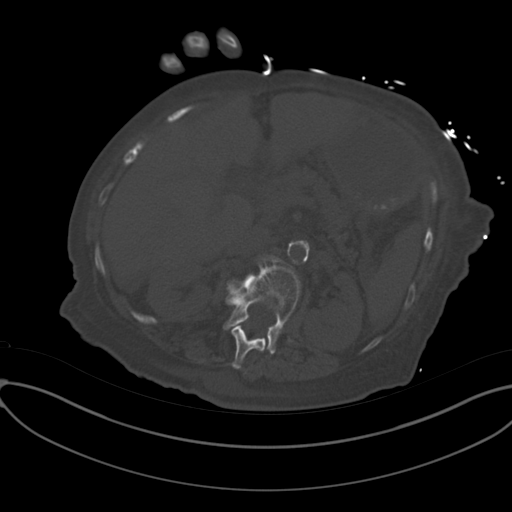
[im 69/89  soft-tissue]
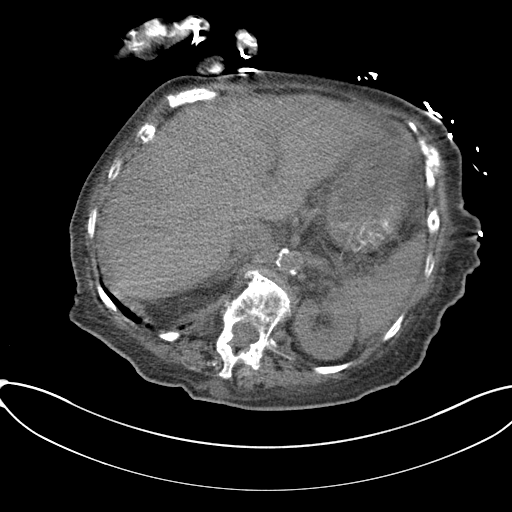
[im 74/89  soft-tissue]
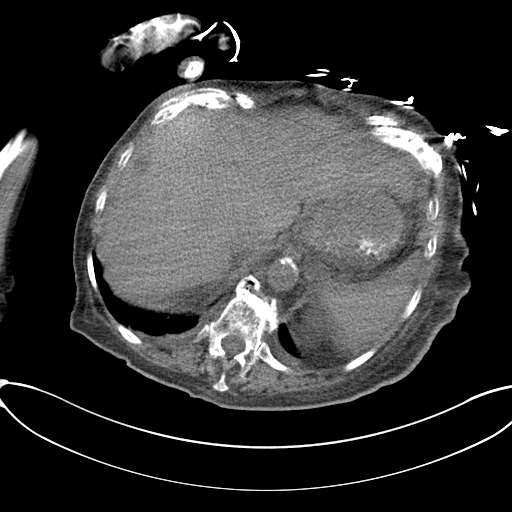
[im 84/89  soft-tissue]
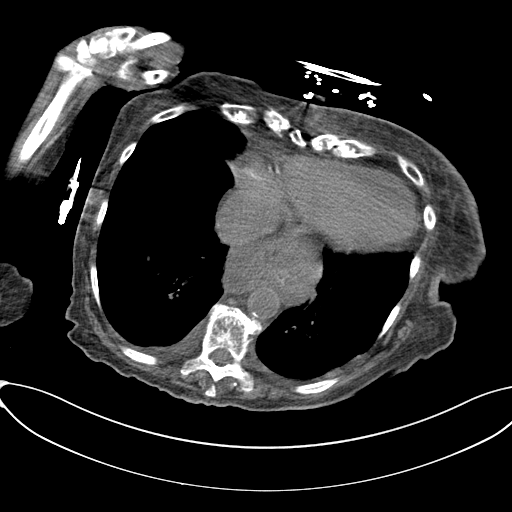

[Series 4: coronal · coronal · 0.74mm/px · 3 of 150 slices shown]
[im 50/150  soft-tissue]
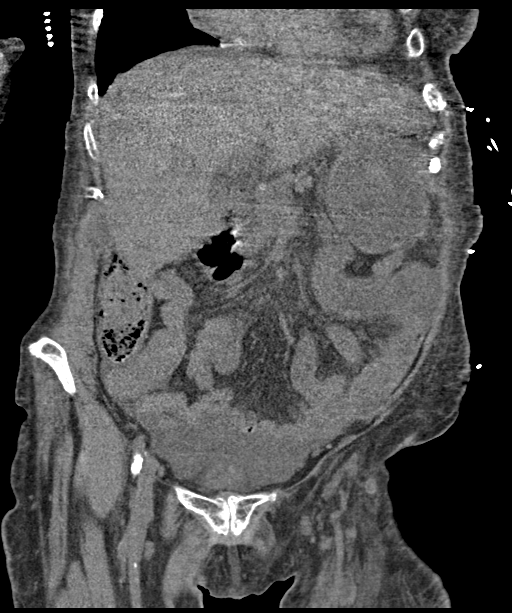
[im 67/150  soft-tissue]
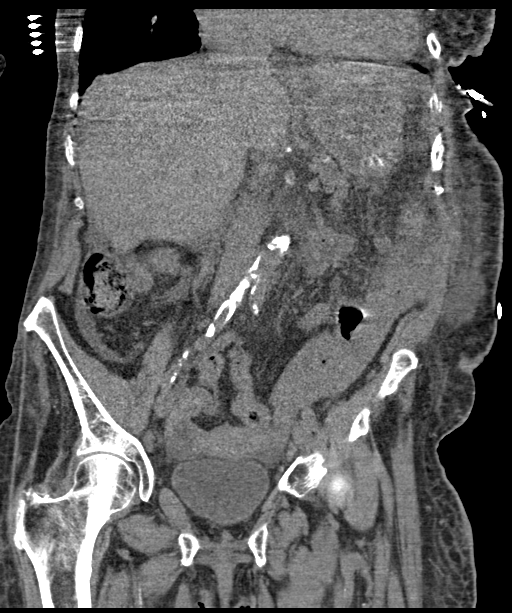
[im 83/150  soft-tissue]
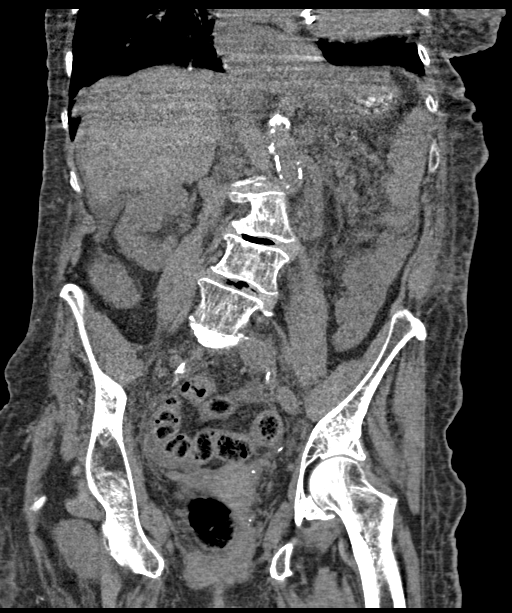

[15 of 46 positions shown; findings below may reference images not displayed]

FINDINGS: Lower chest: Small bilateral pleural effusions. Marked cardiomegaly.
Calcific atherosclerotic disease of the coronary arteries and aorta.
Mitral valve calcifications. Large hiatal hernia.

Hepatobiliary: The liver contour is nodular with enlargement of the
lateral segment, suggestive of possible liver cirrhosis. The
gallbladder is normal. There is a 2.1 cm indeterminate
hypoattenuated mass within the right dome of the liver. Second
smaller 9 mm mass is seen inferiorly.

Pancreas: Unremarkable. No pancreatic ductal dilatation or
surrounding inflammatory changes.

Spleen: Normal in size without focal abnormality.

Adrenals/Urinary Tract: Adrenal glands are unremarkable. Kidneys are
normal, without renal calculi, focal lesion, or hydronephrosis.
Bladder is unremarkable.

Stomach/Bowel: Stomach is fluid-filled, normally distended. Duodenal
diverticulum again noted. There are borderline enlarged small bowels
in the left central abdomen measuring up to 3.3 cm in cross-section,
with associated smooth mucosal thickening.

Vascular/Lymphatic: Aortic atherosclerosis. No enlarged abdominal or
pelvic lymph nodes. Diffuse mesenteric edema.

Reproductive: Again seen is a right adnexal thick-walled cystic
lesion, which measures 1.7 cm in diameter.

Other: Low-density abdominal and pelvic ascites. Transverse gas
bubble in the lower central abdomen seen on image 64/89 is noted,
and favored to represent intraluminal gas.

Musculoskeletal: No fracture is seen. Lumbosacral spine scoliosis
with extensive osteoarthritic changes. Abdominal wall edema. Small
amount of gas within the musculature of the right buttock may be
post injection.
IMPRESSION: Study degraded by motion artifact and lack of IV contrast.

No evidence of acute traumatic injury to the abdomen or pelvis.

Possible liver cirrhosis with moderate amount of ascites within the
abdomen and pelvis. Two indeterminate liver masses. Primary
hepatocellular carcinoma or metastatic lesions cannot be excluded.

Borderline enlargement of small bowel loops in the left abdomen with
diffuse mucosal thickening. This may represent enteritis with mild
ileus.

Small bilateral pleural effusions.

Marked cardiomegaly.

Calcific atherosclerotic disease of the aorta and coronary arteries.

Thick-walled cystic mass in the right adnexa, which has not
increased in size from 1971 and therefore is either benign or
represents an indolent slow growing malignancy.
